# Patient Record
Sex: Female | Born: 1940 | Race: White | Hispanic: No | Marital: Married | Smoking: Former smoker
Health system: Southern US, Community
[De-identification: ages and names within clinical notes are randomized; demographics above are authoritative.]

## PROBLEM LIST (undated history)

## (undated) DIAGNOSIS — I499 Cardiac arrhythmia, unspecified: Secondary | ICD-10-CM

## (undated) DIAGNOSIS — E079 Disorder of thyroid, unspecified: Secondary | ICD-10-CM

## (undated) DIAGNOSIS — R233 Spontaneous ecchymoses: Secondary | ICD-10-CM

## (undated) DIAGNOSIS — J189 Pneumonia, unspecified organism: Secondary | ICD-10-CM

## (undated) DIAGNOSIS — R238 Other skin changes: Secondary | ICD-10-CM

## (undated) DIAGNOSIS — T4145XA Adverse effect of unspecified anesthetic, initial encounter: Secondary | ICD-10-CM

## (undated) DIAGNOSIS — I493 Ventricular premature depolarization: Secondary | ICD-10-CM

## (undated) DIAGNOSIS — J449 Chronic obstructive pulmonary disease, unspecified: Secondary | ICD-10-CM

## (undated) DIAGNOSIS — R002 Palpitations: Secondary | ICD-10-CM

## (undated) DIAGNOSIS — T7840XA Allergy, unspecified, initial encounter: Secondary | ICD-10-CM

## (undated) DIAGNOSIS — M1990 Unspecified osteoarthritis, unspecified site: Secondary | ICD-10-CM

## (undated) DIAGNOSIS — E039 Hypothyroidism, unspecified: Secondary | ICD-10-CM

## (undated) DIAGNOSIS — I739 Peripheral vascular disease, unspecified: Secondary | ICD-10-CM

## (undated) DIAGNOSIS — I2699 Other pulmonary embolism without acute cor pulmonale: Secondary | ICD-10-CM

## (undated) DIAGNOSIS — Z8489 Family history of other specified conditions: Secondary | ICD-10-CM

## (undated) DIAGNOSIS — E785 Hyperlipidemia, unspecified: Secondary | ICD-10-CM

## (undated) DIAGNOSIS — K219 Gastro-esophageal reflux disease without esophagitis: Secondary | ICD-10-CM

## (undated) DIAGNOSIS — T8859XA Other complications of anesthesia, initial encounter: Secondary | ICD-10-CM

## (undated) DIAGNOSIS — IMO0001 Reserved for inherently not codable concepts without codable children: Secondary | ICD-10-CM

## (undated) HISTORY — DX: Allergy, unspecified, initial encounter: T78.40XA

## (undated) HISTORY — PX: OTHER SURGICAL HISTORY: SHX169

## (undated) HISTORY — PX: APPENDECTOMY: SHX54

## (undated) HISTORY — PX: BREAST CYST ASPIRATION: SHX578

## (undated) HISTORY — DX: Ventricular premature depolarization: I49.3

## (undated) HISTORY — DX: Hyperlipidemia, unspecified: E78.5

## (undated) HISTORY — DX: Disorder of thyroid, unspecified: E07.9

## (undated) HISTORY — DX: Unspecified osteoarthritis, unspecified site: M19.90

## (undated) HISTORY — PX: UPPER GI ENDOSCOPY: SHX6162

## (undated) HISTORY — PX: KNEE SURGERY: SHX244

## (undated) HISTORY — PX: COLONOSCOPY: SHX174

---

## 1966-08-26 HISTORY — PX: DILATION AND CURETTAGE OF UTERUS: SHX78

## 1967-08-27 HISTORY — PX: LAPAROTOMY: SHX154

## 1970-08-26 HISTORY — PX: OTHER SURGICAL HISTORY: SHX169

## 1978-08-26 DIAGNOSIS — I739 Peripheral vascular disease, unspecified: Secondary | ICD-10-CM

## 1978-08-26 HISTORY — DX: Peripheral vascular disease, unspecified: I73.9

## 2001-01-15 ENCOUNTER — Encounter: Payer: Self-pay | Admitting: Orthopedic Surgery

## 2001-01-22 ENCOUNTER — Inpatient Hospital Stay (HOSPITAL_COMMUNITY): Admission: RE | Admit: 2001-01-22 | Discharge: 2001-01-27 | Payer: Self-pay | Admitting: Orthopedic Surgery

## 2004-03-26 HISTORY — PX: CHOLECYSTECTOMY: SHX55

## 2004-04-19 ENCOUNTER — Other Ambulatory Visit: Payer: Self-pay

## 2004-10-29 ENCOUNTER — Ambulatory Visit: Payer: Self-pay | Admitting: General Surgery

## 2004-11-30 ENCOUNTER — Ambulatory Visit: Payer: Self-pay

## 2005-08-09 ENCOUNTER — Ambulatory Visit: Payer: Self-pay | Admitting: Family Medicine

## 2005-11-04 ENCOUNTER — Ambulatory Visit: Payer: Self-pay | Admitting: General Surgery

## 2006-07-08 ENCOUNTER — Ambulatory Visit: Payer: Self-pay | Admitting: Family Medicine

## 2006-11-12 ENCOUNTER — Ambulatory Visit: Payer: Self-pay | Admitting: Unknown Physician Specialty

## 2007-10-26 ENCOUNTER — Ambulatory Visit: Payer: Self-pay | Admitting: Unknown Physician Specialty

## 2007-11-18 ENCOUNTER — Ambulatory Visit: Payer: Self-pay | Admitting: Unknown Physician Specialty

## 2008-07-28 ENCOUNTER — Ambulatory Visit: Payer: Self-pay | Admitting: Family Medicine

## 2008-10-01 ENCOUNTER — Observation Stay: Payer: Self-pay | Admitting: Internal Medicine

## 2008-10-03 ENCOUNTER — Ambulatory Visit: Payer: Self-pay | Admitting: Cardiology

## 2008-11-10 ENCOUNTER — Ambulatory Visit: Payer: Self-pay | Admitting: Unknown Physician Specialty

## 2008-11-10 LAB — HM COLONOSCOPY

## 2008-12-05 ENCOUNTER — Ambulatory Visit: Payer: Self-pay | Admitting: Unknown Physician Specialty

## 2008-12-20 ENCOUNTER — Ambulatory Visit: Payer: Self-pay | Admitting: Unknown Physician Specialty

## 2008-12-30 ENCOUNTER — Ambulatory Visit: Payer: Self-pay | Admitting: Unknown Physician Specialty

## 2009-07-17 ENCOUNTER — Ambulatory Visit: Payer: Self-pay | Admitting: Family Medicine

## 2009-07-31 ENCOUNTER — Ambulatory Visit: Payer: Self-pay | Admitting: Unknown Physician Specialty

## 2010-01-08 ENCOUNTER — Ambulatory Visit: Payer: Self-pay | Admitting: Unknown Physician Specialty

## 2010-08-08 ENCOUNTER — Ambulatory Visit: Payer: Self-pay | Admitting: Unknown Physician Specialty

## 2011-01-14 ENCOUNTER — Ambulatory Visit: Payer: Self-pay | Admitting: Unknown Physician Specialty

## 2011-03-01 ENCOUNTER — Ambulatory Visit: Payer: Self-pay | Admitting: Family Medicine

## 2011-04-01 ENCOUNTER — Ambulatory Visit: Payer: Self-pay | Admitting: Family Medicine

## 2011-04-04 ENCOUNTER — Ambulatory Visit: Payer: Self-pay | Admitting: Family Medicine

## 2011-07-11 ENCOUNTER — Ambulatory Visit: Payer: Self-pay | Admitting: Specialist

## 2011-12-07 LAB — HM DEXA SCAN

## 2012-01-28 ENCOUNTER — Ambulatory Visit: Payer: Self-pay | Admitting: Family Medicine

## 2012-05-14 ENCOUNTER — Ambulatory Visit: Payer: Self-pay | Admitting: Specialist

## 2013-04-05 ENCOUNTER — Ambulatory Visit: Payer: Self-pay | Admitting: Family Medicine

## 2013-07-29 ENCOUNTER — Ambulatory Visit: Payer: Self-pay | Admitting: Ophthalmology

## 2013-08-10 ENCOUNTER — Ambulatory Visit: Payer: Self-pay | Admitting: Ophthalmology

## 2013-08-26 HISTORY — PX: EYE SURGERY: SHX253

## 2013-09-07 ENCOUNTER — Ambulatory Visit: Payer: Self-pay | Admitting: Ophthalmology

## 2014-01-10 LAB — BASIC METABOLIC PANEL
BUN: 16 mg/dL (ref 4–21)
CREATININE: 0.9 mg/dL (ref 0.5–1.1)
GLUCOSE: 79 mg/dL
Potassium: 4.2 mmol/L (ref 3.4–5.3)
SODIUM: 141 mmol/L (ref 137–147)

## 2014-01-10 LAB — HEPATIC FUNCTION PANEL
ALT: 27 U/L (ref 7–35)
AST: 16 U/L (ref 13–35)

## 2014-01-10 LAB — CBC AND DIFFERENTIAL
HEMATOCRIT: 46 % (ref 36–46)
Hemoglobin: 15.4 g/dL (ref 12.0–16.0)
PLATELETS: 243 10*3/uL (ref 150–399)
WBC: 6.9 10^3/mL

## 2014-01-10 LAB — LIPID PANEL
Cholesterol: 223 mg/dL — AB (ref 0–200)
HDL: 92 mg/dL — AB (ref 35–70)
LDL CALC: 113 mg/dL
Triglycerides: 92 mg/dL (ref 40–160)

## 2014-01-10 LAB — TSH: TSH: 1.85 u[IU]/mL (ref 0.41–5.90)

## 2014-05-13 ENCOUNTER — Ambulatory Visit: Payer: Self-pay | Admitting: Family Medicine

## 2014-06-07 ENCOUNTER — Ambulatory Visit: Payer: Self-pay | Admitting: Family Medicine

## 2014-06-07 LAB — HM MAMMOGRAPHY

## 2014-06-24 LAB — HM PAP SMEAR: HM Pap smear: NEGATIVE

## 2014-11-25 HISTORY — PX: BLEPHAROPLASTY: SUR158

## 2014-12-16 NOTE — Op Note (Signed)
PATIENT NAME:  [name] [name], [name] [name] MR#:  [id number] DATE OF BIRTH:  [birth year]  DATE OF PROCEDURE:  08/10/2013  PREOPERATIVE DIAGNOSIS: Visually significant cataract of the left eye.   POSTOPERATIVE DIAGNOSIS: Visually significant cataract of the left eye.   OPERATIVE PROCEDURE: Cataract extraction by phacoemulsification with implant of intraocular lens to left eye.   SURGEON: Birder Robson, MD.   ANESTHESIA:  1. Managed anesthesia care.  2. Topical tetracaine drops followed by 2% Xylocaine jelly applied in the preoperative holding area.   COMPLICATIONS: None.   TECHNIQUE:  Stop and chop.  DESCRIPTION OF PROCEDURE: The patient was examined and consented in the preoperative holding area where the aforementioned topical anesthesia was applied to the left eye and then brought back to the Operating Room where the left eye was prepped and draped in the usual sterile ophthalmic fashion and a lid speculum was placed. A paracentesis was created with the side port blade and the anterior chamber was filled with viscoelastic. A near clear corneal incision was performed with the steel keratome. A continuous curvilinear capsulorrhexis was performed with a cystotome followed by the capsulorrhexis forceps. Hydrodissection and hydrodelineation were carried out with BSS on a blunt cannula. The lens was removed in a stop-and-chop technique and the remaining cortical material was removed with the irrigation-aspiration handpiece. The capsular bag was inflated with viscoelastic and the Tecnis ZCB00, 21.0-diopter lens, serial number [id number] was placed in the capsular bag without complication. The remaining viscoelastic was removed from the eye with the irrigation-aspiration handpiece. The wounds were hydrated. The anterior chamber was flushed with Miostat and the eye was inflated to physiologic pressure. 0.1 mL of cefuroxime concentration 10 mg/mL was placed in the anterior chamber. The wounds were found to be water  tight. The eye was dressed with Vigamox. The patient was given protective glasses to wear throughout the day and a shield with which to sleep tonight. The patient was also given drops with which to begin a drop regimen today and will follow-up with me in one day.   ____________________________ Livingston Diones. Tija Biss, MD wlp:cs D: 08/10/2013 15:23:45 ET T: 08/10/2013 15:49:07 ET JOB#: [id number]  cc: Tira Lafferty L. Philander Ake, MD, <Dictator>    Livingston Diones Thandiwe Siragusa MD ELECTRONICALLY SIGNED 08/11/2013 9:50

## 2014-12-17 NOTE — Op Note (Signed)
PATIENT NAME:  [name] [name], [name] [name] MR#:  [id number] DATE OF BIRTH:  08-Dec-1940  DATE OF PROCEDURE:  09/07/2013  PREOPERATIVE DIAGNOSIS: Visually significant cataract of the right eye.   POSTOPERATIVE DIAGNOSIS: Visually significant cataract of the right eye.   OPERATIVE PROCEDURE: Cataract extraction by phacoemulsification with implant of intraocular lens to right eye.   SURGEON: Birder Robson, MD.   ANESTHESIA:  1. Managed anesthesia care.  2. Topical tetracaine drops followed by 2% Xylocaine jelly applied in the preoperative holding area.   COMPLICATIONS: None.   TECHNIQUE:  Stop and chop.   DESCRIPTION OF PROCEDURE: The patient was examined and consented in the preoperative holding area where the aforementioned topical anesthesia was applied to the right eye and then brought back to the Operating Room where the right eye was prepped and draped in the usual sterile ophthalmic fashion and a lid speculum was placed. A paracentesis was created with the side port blade and the anterior chamber was filled with viscoelastic. A near clear corneal incision was performed with the steel keratome. A continuous curvilinear capsulorrhexis was performed with a cystotome followed by the capsulorrhexis forceps. Hydrodissection and hydrodelineation were carried out with BSS on a blunt cannula. The lens was removed in a stop and chop  technique and the remaining cortical material was removed with the irrigation-aspiration handpiece. The capsular bag was inflated with viscoelastic and the Tecnis ZCB00 22.0-diopter lens, serial number [id number] was placed in the capsular bag without complication. The remaining viscoelastic was removed from the eye with the irrigation-aspiration handpiece. The wounds were hydrated. The anterior chamber was flushed with Miostat and the eye was inflated to physiologic pressure. 0.1 mL of cefuroxime concentration 10 mg/mL was placed in the anterior chamber. The wounds were found to be  water tight. The eye was dressed with Vigamox. The patient was given protective glasses to wear throughout the day and a shield with which to sleep tonight. The patient was also given drops with which to begin a drop regimen today and will follow-up with me in one day.     ____________________________ Livingston Diones. Tima Curet, MD wlp:ms D: 09/07/2013 22:41:30 ET T: 09/07/2013 22:49:37 ET JOB#: [id number]  cc: Geryl Dohn L. Constance Hackenberg, MD, <Dictator> Livingston Diones Macaria Bias MD ELECTRONICALLY SIGNED 09/08/2013 9:41

## 2015-05-25 ENCOUNTER — Ambulatory Visit (INDEPENDENT_AMBULATORY_CARE_PROVIDER_SITE_OTHER): Payer: Medicare PPO

## 2015-05-25 DIAGNOSIS — Z23 Encounter for immunization: Secondary | ICD-10-CM | POA: Diagnosis not present

## 2015-05-26 DIAGNOSIS — M542 Cervicalgia: Secondary | ICD-10-CM | POA: Insufficient documentation

## 2015-05-26 DIAGNOSIS — K5792 Diverticulitis of intestine, part unspecified, without perforation or abscess without bleeding: Secondary | ICD-10-CM | POA: Insufficient documentation

## 2015-05-26 DIAGNOSIS — K649 Unspecified hemorrhoids: Secondary | ICD-10-CM | POA: Insufficient documentation

## 2015-05-26 DIAGNOSIS — E039 Hypothyroidism, unspecified: Secondary | ICD-10-CM | POA: Insufficient documentation

## 2015-05-26 DIAGNOSIS — J309 Allergic rhinitis, unspecified: Secondary | ICD-10-CM | POA: Insufficient documentation

## 2015-05-26 DIAGNOSIS — M8580 Other specified disorders of bone density and structure, unspecified site: Secondary | ICD-10-CM | POA: Insufficient documentation

## 2015-05-26 DIAGNOSIS — R002 Palpitations: Secondary | ICD-10-CM | POA: Insufficient documentation

## 2015-05-26 DIAGNOSIS — J449 Chronic obstructive pulmonary disease, unspecified: Secondary | ICD-10-CM | POA: Insufficient documentation

## 2015-05-26 DIAGNOSIS — Z8709 Personal history of other diseases of the respiratory system: Secondary | ICD-10-CM | POA: Insufficient documentation

## 2015-05-26 DIAGNOSIS — Z87898 Personal history of other specified conditions: Secondary | ICD-10-CM | POA: Insufficient documentation

## 2015-05-26 DIAGNOSIS — R252 Cramp and spasm: Secondary | ICD-10-CM | POA: Insufficient documentation

## 2015-05-26 DIAGNOSIS — M6530 Trigger finger, unspecified finger: Secondary | ICD-10-CM | POA: Insufficient documentation

## 2015-05-26 DIAGNOSIS — R918 Other nonspecific abnormal finding of lung field: Secondary | ICD-10-CM | POA: Insufficient documentation

## 2015-05-26 DIAGNOSIS — E7800 Pure hypercholesterolemia, unspecified: Secondary | ICD-10-CM | POA: Insufficient documentation

## 2015-05-26 DIAGNOSIS — K5790 Diverticulosis of intestine, part unspecified, without perforation or abscess without bleeding: Secondary | ICD-10-CM | POA: Insufficient documentation

## 2015-05-26 DIAGNOSIS — F0781 Postconcussional syndrome: Secondary | ICD-10-CM | POA: Insufficient documentation

## 2015-05-26 DIAGNOSIS — H269 Unspecified cataract: Secondary | ICD-10-CM | POA: Insufficient documentation

## 2015-05-26 DIAGNOSIS — K648 Other hemorrhoids: Secondary | ICD-10-CM | POA: Insufficient documentation

## 2015-05-26 DIAGNOSIS — J984 Other disorders of lung: Secondary | ICD-10-CM | POA: Insufficient documentation

## 2015-05-26 DIAGNOSIS — E079 Disorder of thyroid, unspecified: Secondary | ICD-10-CM | POA: Insufficient documentation

## 2015-05-29 ENCOUNTER — Ambulatory Visit (INDEPENDENT_AMBULATORY_CARE_PROVIDER_SITE_OTHER): Payer: Medicare PPO | Admitting: Family Medicine

## 2015-05-29 ENCOUNTER — Encounter: Payer: Self-pay | Admitting: Family Medicine

## 2015-05-29 VITALS — BP 118/70 | HR 72 | Temp 97.4°F | Resp 16 | Wt 134.0 lb

## 2015-05-29 DIAGNOSIS — J449 Chronic obstructive pulmonary disease, unspecified: Secondary | ICD-10-CM | POA: Insufficient documentation

## 2015-05-29 DIAGNOSIS — S8992XA Unspecified injury of left lower leg, initial encounter: Secondary | ICD-10-CM

## 2015-05-29 DIAGNOSIS — M25562 Pain in left knee: Secondary | ICD-10-CM

## 2015-05-29 NOTE — Progress Notes (Signed)
Subjective:    Patient ID: [name] [name], female    DOB: December 06, 1940, 74 y.o.   MRN: [id number]  Knee Pain  The incident occurred more than 1 week ago (x 1.5 months; pt fell at home). The incident occurred at home. The injury mechanism was a fall and a twisting injury. The pain is present in the left knee. The quality of the pain is described as aching. The pain is at a severity of 2/10 (can get up to a 7/10). The pain is mild. The pain has been fluctuating since onset. Pertinent negatives include no inability to bear weight, loss of motion, loss of sensation, muscle weakness, numbness or tingling. Exacerbated by: sitting, lying. She has tried acetaminophen and NSAIDs for the symptoms. The treatment provided mild relief.  Pt notes crepitus.    Review of Systems  Constitutional: Negative for fever, chills, diaphoresis, activity change, appetite change, fatigue and unexpected weight change.  Respiratory: Negative for cough, shortness of breath and wheezing.   Cardiovascular: Positive for palpitations (had cardiology work up 3-4 years ago; problem is reocurring) and leg swelling. Negative for chest pain.  Musculoskeletal: Positive for arthralgias.  Neurological: Negative for tingling and numbness.   BP 118/70 mmHg  Pulse 72  Temp(Src) 97.4 F (36.3 C) (Oral)  Resp 16  Wt 134 lb (60.782 kg)   Patient Active Problem List   Diagnosis Date Noted  . Chronic obstructive pulmonary disease ([place]) 05/29/2015  . Allergic rhinitis 05/26/2015  . Bilateral cataracts 05/26/2015  . CAFL (chronic airflow limitation) ([place]) 05/26/2015  . 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 History of palpitations 05/26/2015  . H/O respiratory system disease 05/26/2015  . Calcium blood increased 05/26/2015  . Hypercholesteremia 05/26/2015  . Adult hypothyroidism 05/26/2015  . Cramps of lower extremity 05/26/2015  . Hemorrhoids, internal 05/26/2015  . Disease of lung 05/26/2015  . Lung mass 05/26/2015  .  Cervical pain 05/26/2015  . Osteopenia 05/26/2015  . Awareness of heartbeats 05/26/2015  . 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 Acquired trigger finger 05/26/2015  . Disease of thyroid gland 05/26/2015   Past Medical History  Diagnosis Date  . Allergy   . Thyroid disease   . Hyperlipidemia    Current Outpatient Prescriptions on File Prior to Visit  Medication Sig  . Acetaminophen 500 MG coapsule Take 2 capsules by mouth every 6 (six) hours as needed.  Marland Kitchen albuterol (VENTOLIN HFA) 108 (90 BASE) MCG/ACT inhaler Inhale into the lungs. 2 puffs every 6 hours as needed  . fexofenadine (ALLEGRA) 180 MG tablet Take 1 tablet by mouth daily.  . Fluticasone-Salmeterol (ADVAIR DISKUS) 250-50 MCG/DOSE AEPB Inhale into the lungs. 1 puff BID daily  . levothyroxine (SYNTHROID, LEVOTHROID) 75 MCG tablet Take 1 tablet by mouth daily.  . MULTIPLE VITAMIN PO Take 1 tablet by mouth daily.  Marland Kitchen omeprazole (PRILOSEC) 20 MG capsule Take 1 capsule by mouth daily.  Marland Kitchen tiotropium (SPIRIVA HANDIHALER) 18 MCG inhalation capsule Place 1 capsule into inhaler and inhale daily.  . budesonide (PULMICORT) 0.25 MG/2ML nebulizer solution Inhale into the lungs.  . Calcium-Vitamin D 600-200 MG-UNIT tablet Take 1 tablet by mouth daily.  Marland Kitchen loperamide (IMODIUM) 2 MG capsule Take 2 capsules by mouth 2 (two) times daily.  . meloxicam (MOBIC) 7.5 MG tablet Take 1 tablet by mouth daily.  . Omega-3 Fatty Acids (FISH OIL) 1000 MG CAPS Take 1 capsule by mouth daily.   No current facility-administered medications on file prior to visit.   Allergies  Allergen Reactions  . Codeine   . Diphenhydramine     Decreased BP and pulse rate  . Montelukast Sodium     Insomnia  . Morphine Sulfate   . Nsaids     Can take Meloxicam, Naproxen  . Tramadol     Mental status change  . Celecoxib Rash    Epigastric pain  . Rofecoxib Rash    Epigastric pain   Past Surgical History  Procedure Laterality Date  . Cholecystectomy  03/2004   . 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 Patelectomy Right   . Removal of medical meniscus  1972  . Laparotomy  1969    fertility testing  . Dilation and curettage of uterus  1968    fertility testing  . Appendectomy    . Abdominal surgery     Social History   Social History  . Marital Status: Married    Spouse Name: N/A  . Number of Children: N/A  . Years of Education: N/A   Occupational History  . Not on file.   Social History Main Topics  . Smoking status: Former Smoker    Quit date: 08/25/1998  . Smokeless tobacco: Never Used  . Alcohol Use: Yes  . Drug Use: No  . Sexual Activity: Not on file   Other Topics Concern  . Not on file   Social History Narrative   Family History  Problem Relation Age of Onset  . Diabetes Mother   . Hypertension Mother   . Congestive Heart Failure Mother   . Heart disease Father   . Hypertension Father   . Hyperlipidemia Sister   . Heart disease Sister   . Paget's disease of bone Sister   . Hyperthyroidism Sister      .result     Objective:   Physical Exam  Constitutional: She appears well-developed and well-nourished.  Musculoskeletal: She exhibits no edema.       Right knee: She exhibits swelling (under patella).       Left knee: She exhibits decreased range of motion and swelling (under patella). Tenderness found.  Psychiatric: She has a normal mood and affect. Her behavior is normal.   BP 118/70 mmHg  Pulse 72  Temp(Src) 97.4 F (36.3 C) (Oral)  Resp 16  Wt 134 lb (60.782 kg)     Assessment & Plan:  1. Left knee pain Refer to Dr. Mack Guise as below. Advised pt to try Mobic (she already as rx at home). - Ambulatory referral to Orthopedic Surgery  2. Acute injury of knee cartilage, left, initial encounter See plan above.   Patient seen and examined by Jerrell Belfast, MD, and note scribed by Renaldo Fiddler, CMA. I have reviewed the document for accuracy and  completeness and I agree with above. Jerrell Belfast, MD   Margarita Rana, MD

## 2015-06-28 ENCOUNTER — Encounter: Payer: Self-pay | Admitting: Family Medicine

## 2015-06-28 ENCOUNTER — Ambulatory Visit (INDEPENDENT_AMBULATORY_CARE_PROVIDER_SITE_OTHER): Payer: Medicare PPO | Admitting: Family Medicine

## 2015-06-28 VITALS — BP 108/60 | HR 76 | Temp 97.6°F | Resp 16 | Ht 63.0 in | Wt 133.0 lb

## 2015-06-28 DIAGNOSIS — M8580 Other specified disorders of bone density and structure, unspecified site: Secondary | ICD-10-CM | POA: Diagnosis not present

## 2015-06-28 DIAGNOSIS — Z1231 Encounter for screening mammogram for malignant neoplasm of breast: Secondary | ICD-10-CM

## 2015-06-28 DIAGNOSIS — E7800 Pure hypercholesterolemia, unspecified: Secondary | ICD-10-CM | POA: Diagnosis not present

## 2015-06-28 DIAGNOSIS — E039 Hypothyroidism, unspecified: Secondary | ICD-10-CM

## 2015-06-28 DIAGNOSIS — Z0000 Encounter for general adult medical examination without abnormal findings: Secondary | ICD-10-CM

## 2015-06-28 DIAGNOSIS — R002 Palpitations: Secondary | ICD-10-CM | POA: Diagnosis not present

## 2015-06-28 DIAGNOSIS — Z8679 Personal history of other diseases of the circulatory system: Secondary | ICD-10-CM | POA: Diagnosis not present

## 2015-06-28 DIAGNOSIS — Z87898 Personal history of other specified conditions: Secondary | ICD-10-CM

## 2015-06-28 NOTE — Progress Notes (Signed)
Patient: [name] [name], Female    DOB: 1941-03-08, 74 y.o.   MRN: [id number] Visit Date: 06/28/2015  Today's Provider: Margarita Rana, MD   Chief Complaint  Patient presents with  . Medicare Wellness  . Palpitations   Subjective:    Annual wellness visit [name] [name] is a 74 y.o. female. She feels well. She reports exercising daily; walks 5 miles daily. She reports she is sleeping fairly well.  Chronic problems stable.   ----------------------------------------------------------- Last CPE- 06/24/2014 Last Pap- 06/24/2014 Neg Last Mammo- 06/07/2014- BI-RADS 1 Last colon- 11/10/2008- diverticulosis, internal hemorrhoids- recheck 10 years Last BMD- 11/07/2011- Osteopenia Last EKG- 07/29/2013  Palpitations: Patient complains of palpitations.  The symptoms are of mild to moderate in severity, occuring intermittently and lasting several minutes per episode. Cardiac risk factors include: advanced age (older than 66 for men, 75 for women). Aggravating factors: none. Relieving factors: none. Associated signs and symptoms: has complaint(s) of irregular heart beat, lower extremity edema and palpitations.\ Pt reports she has a h/o palpitations about 10 years ago, and only recently has this problem reoccurred. 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  Review of Systems  HENT: Positive for congestion, sinus pressure, sneezing, tinnitus and voice change.   Eyes: Positive for photophobia.  Respiratory: Positive for chest tightness.   Cardiovascular: Positive for palpitations.  Gastrointestinal: Positive for constipation.  Endocrine: Positive for cold intolerance.  Genitourinary: Positive for enuresis.  Musculoskeletal: Positive for myalgias, back pain, arthralgias and neck pain.  Allergic/Immunologic: Positive for environmental allergies.  Neurological: Positive for light-headedness.  Hematological: Bruises/bleeds easily.  All other systems reviewed and are  negative.   Social History   Social History  . Marital Status: Married    Spouse Name: N/A  . Number of Children: N/A  . Years of Education: N/A   Occupational History  . Not on file.   Social History Main Topics  . Smoking status: Former Smoker    Quit date: 08/25/1998  . Smokeless tobacco: Never Used  . Alcohol Use: Yes     Comment: occasionally  . Drug Use: No  . Sexual Activity: Not on file   Other Topics Concern  . Not on file   Social History Narrative    Patient Active Problem List   Diagnosis Date Noted  . Chronic obstructive pulmonary disease ([place]) 05/29/2015  . Allergic rhinitis 05/26/2015  . Bilateral cataracts 05/26/2015  . CAFL (chronic airflow limitation) ([place]) 05/26/2015  . 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 History of palpitations 05/26/2015  . H/O respiratory system disease 05/26/2015  . Calcium blood increased 05/26/2015  . Hypercholesteremia 05/26/2015  . Adult hypothyroidism 05/26/2015  . Cramps of lower extremity 05/26/2015  . Hemorrhoids, internal 05/26/2015  . Disease of lung 05/26/2015  . Lung mass 05/26/2015  . Cervical pain 05/26/2015  . Osteopenia 05/26/2015  . Awareness of heartbeats 05/26/2015  . 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 Acquired trigger finger 05/26/2015  . Disease of thyroid gland 05/26/2015    Past Surgical History  Procedure Laterality Date  . Cholecystectomy  03/2004  . 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 Patelectomy Right   . Removal of medical meniscus  1972  . Laparotomy  1969    fertility testing  . Dilation and curettage of uterus  1968    fertility testing  . Appendectomy    . Abdominal surgery    .  Eye surgery Bilateral 2015    cataract excision  . Blepharoplasty Bilateral 11/2014    Her family history includes Congestive Heart Failure in her mother; Diabetes in her mother; Heart disease in her father and sister; Hyperlipidemia in her sister;  Hypertension in her father and mother; Hyperthyroidism in her sister; Paget's disease of bone in her sister.    Previous Medications   ACETAMINOPHEN 500 MG COAPSULE    Take 2 capsules by mouth every 6 (six) hours as needed.   ALBUTEROL (VENTOLIN HFA) 108 (90 BASE) MCG/ACT INHALER    Inhale into the lungs. 2 puffs every 6 hours as needed   FEXOFENADINE (ALLEGRA) 180 MG TABLET    Take 1 tablet by mouth daily.   FLONASE 50 MCG/ACT NASAL SPRAY       FLUTICASONE-SALMETEROL (ADVAIR DISKUS) 250-50 MCG/DOSE AEPB    Inhale into the lungs. 1 puff BID daily   LEVOTHYROXINE (SYNTHROID, LEVOTHROID) 75 MCG TABLET    Take 1 tablet by mouth daily.   MAGNESIUM PO    Take by mouth.   MULTIPLE VITAMIN PO    Take 1 tablet by mouth daily.   OMEGA-3 FATTY ACIDS (FISH OIL) 1000 MG CAPS    Take 1 capsule by mouth daily.   OMEPRAZOLE (PRILOSEC) 20 MG CAPSULE    Take 1 capsule by mouth daily.   TIOTROPIUM (SPIRIVA HANDIHALER) 18 MCG INHALATION CAPSULE    Place 1 capsule into inhaler and inhale daily.   TRIAMCINOLONE (NASACORT ALLERGY 24HR) 55 MCG/ACT AERO NASAL INHALER    Place 2 sprays into the nose daily.    Patient Care Team: Margarita Rana, MD as PCP - General (Family Medicine)     Objective:   Vitals: BP 108/60 mmHg  Pulse 76  Temp(Src) 97.6 F (36.4 C) (Oral)  Resp 16  Ht 5\' 3"  (1.6 m)  Wt 133 lb (60.328 kg)  BMI 23.57 kg/m2  Physical Exam  Activities of Daily Living In your present state of health, do you have any difficulty performing the following activities: 06/28/2015  Hearing? Y  Vision? N  Difficulty concentrating or making decisions? N  Walking or climbing stairs? N  Dressing or bathing? N  Doing errands, shopping? N    Fall Risk Assessment Fall Risk  06/28/2015  Falls in the past year? Yes  Number falls in past yr: 1  Injury with Fall? No     Depression Screen PHQ 2/9 Scores 06/28/2015  PHQ - 2 Score 0    Cognitive Testing - 6-CIT  Correct? Score   What year is it? yes 0 0  or 4  What month is it? yes 0 0 or 3  Memorize:    Pia Mau,  42,  [place],      What time is it? (within 1 hour) yes 0 0 or 3  Count backwards from 20 yes 0 0, 2, or 4  Name the months of the year yes 0 0, 2, or 4  Repeat name & address above yes 0 0, 2, 4, 6, 8, or 10       TOTAL SCORE  0/28   Interpretation:  Normal  Normal (0-7) Abnormal (8-28)       Assessment & Plan:     Annual Wellness Visit  Reviewed patient's Family Medical History Reviewed and updated list of patient's medical providers Assessment of cognitive impairment was done Assessed patient's functional ability Established a written schedule for health screening [place] Completed  and Reviewed  Exercise Activities and Dietary recommendations Goals    None      Immunization History  Administered Date(s) Administered  . Influenza, High Dose Seasonal PF 05/25/2015  . Pneumococcal Conjugate-13 06/24/2014  . Pneumococcal Polysaccharide-23 09/23/2013  . Tdap 11/02/2010    Health Maintenance  Topic Date Due  . ZOSTAVAX  05/15/2001  . INFLUENZA VACCINE  03/26/2016  . MAMMOGRAM  06/07/2016  . COLONOSCOPY  11/11/2018  . TETANUS/TDAP  11/01/2020  . DEXA SCAN  Completed  . PNA vac Low Risk Adult  Completed      Discussed health benefits of physical activity, and encouraged her to engage in regular exercise appropriate for her age and condition.   1. Medicare annual wellness visit, subsequent As above.    2. Hypothyroidism, unspecified hypothyroidism type Stable. Check labs.  - TSH  3. Awareness of heartbeats New problem or recurrent. Unclear if similar to previous issues. EKG ok today. Will refer to cardiology to evaluate and treat.   - EKG 12-Lead - Ambulatory referral to Cardiology  4. History of palpitations As above.  - Ambulatory referral to Cardiology  5. Hypercholesteremia Stable. Will check labs.   - CBC with Differential/Platelet -  Comprehensive metabolic panel - Lipid panel  6. Osteopenia Will schedule.  - DG Bone Density; Future  7. Encounter for screening mammogram for breast cancer Will call and schedule.  - MM DIGITAL SCREENING BILATERAL; Future  Patient was seen and examined by Jerrell Belfast, MD, and note scribed by Renaldo Fiddler, CMA. I have reviewed the document for accuracy and completeness and I agree with above. Jerrell Belfast, MD   Margarita Rana, MD

## 2015-06-30 DIAGNOSIS — J019 Acute sinusitis, unspecified: Secondary | ICD-10-CM | POA: Insufficient documentation

## 2015-06-30 DIAGNOSIS — S0990XA Unspecified injury of head, initial encounter: Secondary | ICD-10-CM | POA: Insufficient documentation

## 2015-06-30 DIAGNOSIS — Z23 Encounter for immunization: Secondary | ICD-10-CM | POA: Insufficient documentation

## 2015-06-30 DIAGNOSIS — J441 Chronic obstructive pulmonary disease with (acute) exacerbation: Secondary | ICD-10-CM | POA: Insufficient documentation

## 2015-06-30 DIAGNOSIS — J329 Chronic sinusitis, unspecified: Secondary | ICD-10-CM | POA: Insufficient documentation

## 2015-06-30 DIAGNOSIS — Z8619 Personal history of other infectious and parasitic diseases: Secondary | ICD-10-CM | POA: Insufficient documentation

## 2015-06-30 DIAGNOSIS — Z0000 Encounter for general adult medical examination without abnormal findings: Secondary | ICD-10-CM | POA: Insufficient documentation

## 2015-06-30 DIAGNOSIS — J189 Pneumonia, unspecified organism: Secondary | ICD-10-CM | POA: Insufficient documentation

## 2015-06-30 DIAGNOSIS — Z13820 Encounter for screening for osteoporosis: Secondary | ICD-10-CM | POA: Insufficient documentation

## 2015-06-30 DIAGNOSIS — R238 Other skin changes: Secondary | ICD-10-CM

## 2015-06-30 DIAGNOSIS — M79609 Pain in unspecified limb: Secondary | ICD-10-CM | POA: Insufficient documentation

## 2015-06-30 DIAGNOSIS — R252 Cramp and spasm: Secondary | ICD-10-CM | POA: Insufficient documentation

## 2015-06-30 DIAGNOSIS — R911 Solitary pulmonary nodule: Secondary | ICD-10-CM | POA: Insufficient documentation

## 2015-06-30 DIAGNOSIS — N390 Urinary tract infection, site not specified: Secondary | ICD-10-CM | POA: Insufficient documentation

## 2015-07-01 LAB — CBC WITH DIFFERENTIAL/PLATELET
BASOS ABS: 0 10*3/uL (ref 0.0–0.2)
BASOS: 0 %
EOS (ABSOLUTE): 0.1 10*3/uL (ref 0.0–0.4)
Eos: 2 %
Hematocrit: 42.8 % (ref 34.0–46.6)
Hemoglobin: 14.9 g/dL (ref 11.1–15.9)
IMMATURE GRANULOCYTES: 0 %
Immature Grans (Abs): 0 10*3/uL (ref 0.0–0.1)
Lymphocytes Absolute: 2 10*3/uL (ref 0.7–3.1)
Lymphs: 38 %
MCH: 33.2 pg — AB (ref 26.6–33.0)
MCHC: 34.8 g/dL (ref 31.5–35.7)
MCV: 95 fL (ref 79–97)
MONOS ABS: 0.5 10*3/uL (ref 0.1–0.9)
Monocytes: 10 %
NEUTROS PCT: 50 %
Neutrophils Absolute: 2.6 10*3/uL (ref 1.4–7.0)
PLATELETS: 219 10*3/uL (ref 150–379)
RBC: 4.49 x10E6/uL (ref 3.77–5.28)
RDW: 13.8 % (ref 12.3–15.4)
WBC: 5.2 10*3/uL (ref 3.4–10.8)

## 2015-07-01 LAB — TSH: TSH: 1.59 u[IU]/mL (ref 0.450–4.500)

## 2015-07-01 LAB — COMPREHENSIVE METABOLIC PANEL
A/G RATIO: 2.2 (ref 1.1–2.5)
ALK PHOS: 56 IU/L (ref 39–117)
ALT: 27 IU/L (ref 0–32)
AST: 26 IU/L (ref 0–40)
Albumin: 4.3 g/dL (ref 3.5–4.8)
BILIRUBIN TOTAL: 1.1 mg/dL (ref 0.0–1.2)
BUN/Creatinine Ratio: 19 (ref 11–26)
BUN: 14 mg/dL (ref 8–27)
CHLORIDE: 99 mmol/L (ref 97–106)
CO2: 25 mmol/L (ref 18–29)
Calcium: 9.4 mg/dL (ref 8.7–10.3)
Creatinine, Ser: 0.72 mg/dL (ref 0.57–1.00)
GFR calc non Af Amer: 83 mL/min/{1.73_m2} (ref >59)
GFR, EST AFRICAN AMERICAN: 95 mL/min/{1.73_m2} (ref >59)
GLUCOSE: 85 mg/dL (ref 65–99)
Globulin, Total: 2 g/dL (ref 1.5–4.5)
POTASSIUM: 4.1 mmol/L (ref 3.5–5.2)
Sodium: 137 mmol/L (ref 136–144)
Total Protein: 6.3 g/dL (ref 6.0–8.5)

## 2015-07-01 LAB — LIPID PANEL
CHOLESTEROL TOTAL: 230 mg/dL — AB (ref 100–199)
Chol/HDL Ratio: 2.6 ratio units (ref 0.0–4.4)
HDL: 90 mg/dL (ref >39)
LDL Calculated: 128 mg/dL — ABNORMAL HIGH (ref 0–99)
TRIGLYCERIDES: 60 mg/dL (ref 0–149)
VLDL CHOLESTEROL CAL: 12 mg/dL (ref 5–40)

## 2015-07-03 ENCOUNTER — Telehealth: Payer: Self-pay

## 2015-07-03 NOTE — Telephone Encounter (Signed)
-----   Message from Margarita Rana, MD sent at 07/01/2015 10:09 AM EDT ----- Cholesterol is elevated 230, does have mildly elevated LDL at 128, with high good cholesterol. 10 year risk of heart disease is 13 percent. Baby ASA daily is recommended if  not taking one. ALso, statin is indicated if would like to start one.  That is a little controversial as risk bases on age.  Thanks- Dr. Jerilynn Mages

## 2015-07-03 NOTE — Telephone Encounter (Signed)
Pt advised.  She states she has tried a statin before and caused her to have muscle pain.   Thanks,   -Mickel Baas

## 2015-07-07 ENCOUNTER — Encounter: Payer: Self-pay | Admitting: Family Medicine

## 2015-07-10 ENCOUNTER — Telehealth: Payer: Self-pay | Admitting: Family Medicine

## 2015-07-10 NOTE — Telephone Encounter (Signed)
Sent patient message re: CT.

## 2015-07-17 ENCOUNTER — Encounter: Payer: Self-pay | Admitting: Family Medicine

## 2015-07-17 DIAGNOSIS — R911 Solitary pulmonary nodule: Secondary | ICD-10-CM

## 2015-07-17 DIAGNOSIS — Z87891 Personal history of nicotine dependence: Secondary | ICD-10-CM

## 2015-07-19 ENCOUNTER — Ambulatory Visit: Payer: Self-pay

## 2015-07-19 ENCOUNTER — Other Ambulatory Visit: Payer: Self-pay

## 2015-07-25 ENCOUNTER — Ambulatory Visit
Admission: RE | Admit: 2015-07-25 | Discharge: 2015-07-25 | Disposition: A | Discharge status: Home / Self Care | Payer: Medicare PPO | Source: Ambulatory Visit | Attending: Family Medicine | Admitting: Family Medicine

## 2015-07-25 ENCOUNTER — Other Ambulatory Visit: Payer: Self-pay | Admitting: Family Medicine

## 2015-07-25 DIAGNOSIS — M810 Age-related osteoporosis without current pathological fracture: Secondary | ICD-10-CM | POA: Insufficient documentation

## 2015-07-25 DIAGNOSIS — Z1231 Encounter for screening mammogram for malignant neoplasm of breast: Secondary | ICD-10-CM

## 2015-07-25 DIAGNOSIS — Z13820 Encounter for screening for osteoporosis: Secondary | ICD-10-CM | POA: Insufficient documentation

## 2015-07-25 DIAGNOSIS — M8580 Other specified disorders of bone density and structure, unspecified site: Secondary | ICD-10-CM

## 2015-08-01 ENCOUNTER — Ambulatory Visit
Admission: RE | Admit: 2015-08-01 | Discharge: 2015-08-01 | Disposition: A | Discharge status: Home / Self Care | Payer: Medicare PPO | Source: Ambulatory Visit | Attending: Family Medicine | Admitting: Family Medicine

## 2015-08-01 ENCOUNTER — Other Ambulatory Visit: Payer: Self-pay | Admitting: Family Medicine

## 2015-08-01 ENCOUNTER — Telehealth: Payer: Self-pay | Admitting: Family Medicine

## 2015-08-01 ENCOUNTER — Other Ambulatory Visit: Payer: Self-pay

## 2015-08-01 DIAGNOSIS — Z87891 Personal history of nicotine dependence: Secondary | ICD-10-CM | POA: Diagnosis present

## 2015-08-01 DIAGNOSIS — I2510 Atherosclerotic heart disease of native coronary artery without angina pectoris: Secondary | ICD-10-CM | POA: Insufficient documentation

## 2015-08-01 DIAGNOSIS — L989 Disorder of the skin and subcutaneous tissue, unspecified: Secondary | ICD-10-CM | POA: Insufficient documentation

## 2015-08-01 DIAGNOSIS — I700 Atherosclerosis of aorta: Secondary | ICD-10-CM | POA: Insufficient documentation

## 2015-08-01 DIAGNOSIS — R911 Solitary pulmonary nodule: Secondary | ICD-10-CM

## 2015-08-01 DIAGNOSIS — R918 Other nonspecific abnormal finding of lung field: Secondary | ICD-10-CM

## 2015-08-01 MED ORDER — IOHEXOL 350 MG/ML SOLN
75.0000 mL | Freq: Once | INTRAVENOUS | Status: AC | PRN
Start: 2015-08-01 — End: 2015-08-01
  Administered 2015-08-01: 60 mL via INTRAVENOUS

## 2015-08-01 NOTE — Telephone Encounter (Signed)
Will see patient when they are available.  Thanks.

## 2015-08-01 NOTE — Telephone Encounter (Signed)
Pt stated she was returning your call and if possible she would like you to call her back before 2:30 b/c her husband has appt across the hall at 3 this afternoon. Pt stated she will try to come over this afternoon to see if you are available if you can't call her before she leaves for her husband's appt. Thanks TNP

## 2015-08-10 ENCOUNTER — Inpatient Hospital Stay: Payer: Medicare PPO | Attending: Cardiothoracic Surgery | Admitting: Cardiothoracic Surgery

## 2015-08-10 ENCOUNTER — Inpatient Hospital Stay: Payer: Medicare PPO

## 2015-08-10 ENCOUNTER — Encounter: Payer: Self-pay | Admitting: Cardiothoracic Surgery

## 2015-08-10 VITALS — BP 147/85 | HR 79 | Temp 97.7°F | Ht 63.0 in | Wt 134.7 lb

## 2015-08-10 DIAGNOSIS — R911 Solitary pulmonary nodule: Secondary | ICD-10-CM | POA: Insufficient documentation

## 2015-08-10 DIAGNOSIS — R918 Other nonspecific abnormal finding of lung field: Secondary | ICD-10-CM

## 2015-08-10 LAB — PROTIME-INR
INR: 0.94
PROTHROMBIN TIME: 12.8 s (ref 11.4–15.0)

## 2015-08-10 LAB — CBC WITH DIFFERENTIAL/PLATELET
BASOS ABS: 0.1 10*3/uL (ref 0–0.1)
BASOS PCT: 1 %
EOS ABS: 0.1 10*3/uL (ref 0–0.7)
Eosinophils Relative: 2 %
HEMATOCRIT: 43.4 % (ref 35.0–47.0)
HEMOGLOBIN: 14.7 g/dL (ref 12.0–16.0)
Lymphocytes Relative: 27 %
Lymphs Abs: 1.4 10*3/uL (ref 1.0–3.6)
MCH: 32.4 pg (ref 26.0–34.0)
MCHC: 33.8 g/dL (ref 32.0–36.0)
MCV: 95.7 fL (ref 80.0–100.0)
MONO ABS: 0.5 10*3/uL (ref 0.2–0.9)
Monocytes Relative: 10 %
NEUTROS ABS: 3.2 10*3/uL (ref 1.4–6.5)
NEUTROS PCT: 60 %
Platelets: 275 10*3/uL (ref 150–440)
RBC: 4.54 MIL/uL (ref 3.80–5.20)
RDW: 12.8 % (ref 11.5–14.5)
WBC: 5.3 10*3/uL (ref 3.6–11.0)

## 2015-08-10 LAB — APTT: APTT: 28 s (ref 24–36)

## 2015-08-10 NOTE — Progress Notes (Signed)
Opened chart to complete follow up note.

## 2015-08-10 NOTE — Progress Notes (Signed)
Patient ID: [name] [name], female   DOB: 12/07/1940, 74 y.o.   MRN: [id number]  Chief Complaint  Patient presents with  . Lung Mass    Results    Referred By Dr. Margarita Rana Reason for Referral right lower lobe mass  HPI Location, Quality, Duration, Severity, Timing, Context, Modifying Factors, Associated Signs and Symptoms.  [name] [name] is a 74 y.o. female.  This patient is a 74 year old female with a prior smoking history having quit approximately 18 years ago. She smoked for about 30 years altogether. She was in her usual state of health until she went to have her annual exam and requested a CT scan on the basis of her smoking history. In addition she has a diagnosis of severe COPD for which she had been followed by Dr. Raul Del at the current nodal clinic. Because of this a CT scan was performed which revealed a right lower lobe nodule measuring about 1 cm in size. When compared to a prior scan and 2012 the nodule is new and highly concerning for malignancy. The patient presents here for further evaluation and/or treatment. She states that she does not get short of breath despite her diagnosis of COPD. She is able to walk as often as she would like. She walked up a flight of stairs to get here today without any limitations. She's had no fevers or chills or cough. She has had a pneumonia in the past and states that she has no symptoms that are similar to this.   Past Medical History  Diagnosis Date  . Allergy   . Thyroid disease   . Hyperlipidemia   . PVC (premature ventricular contraction)     Past Surgical History  Procedure Laterality Date  . Cholecystectomy  03/2004  . 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 Patelectomy Right   . Removal of medical meniscus  1972  . Laparotomy  1969    fertility testing  . Dilation and curettage of uterus  1968    fertility testing  . Appendectomy    . Abdominal surgery    . Eye surgery Bilateral 2015     cataract excision  . Blepharoplasty Bilateral 11/2014  . Breast cyst aspiration Left     Family History  Problem Relation Age of Onset  . Diabetes Mother   . Hypertension Mother   . Congestive Heart Failure Mother   . Heart disease Father   . Hypertension Father   . Hyperlipidemia Sister   . Heart disease Sister   . Paget's disease of bone Sister   . Hyperthyroidism Sister   . Cancer Sister     Social History Social History  Substance Use Topics  . Smoking status: Former Smoker    Quit date: 08/25/1998  . Smokeless tobacco: Never Used  . Alcohol Use: Yes     Comment: occasionally    Allergies  Allergen Reactions  . Codeine   . Diphenhydramine     Decreased BP and pulse rate  . Montelukast Sodium     Insomnia  . Morphine Sulfate   . Nsaids     Can take Meloxicam, Naproxen  . Tramadol     Mental status change  . Celecoxib Rash    Epigastric pain  . Rofecoxib Rash    Epigastric pain    Current Outpatient Prescriptions  Medication Sig Dispense Refill  . Acetaminophen 500 MG coapsule Take 2 capsules by mouth every 6 (six) hours as  needed.    Marland Kitchen albuterol (VENTOLIN HFA) 108 (90 BASE) MCG/ACT inhaler Inhale into the lungs. 2 puffs every 6 hours as needed    . aspirin 81 MG tablet     . bacitracin ophthalmic ointment APPLY A SMALL AMOUNT ON SUTURES 4 TIMES A DAY FOR 10-14 DAYS  2  . fexofenadine (ALLEGRA) 180 MG tablet Take 1 tablet by mouth daily.    . flecainide (TAMBOCOR) 50 MG tablet Take by mouth.    . Fluticasone-Salmeterol (ADVAIR DISKUS) 250-50 MCG/DOSE AEPB Inhale into the lungs. 1 puff BID daily    . levothyroxine (SYNTHROID, LEVOTHROID) 75 MCG tablet Take 1 tablet by mouth daily.    . MULTIPLE VITAMIN PO Take 1 tablet by mouth daily.    Marland Kitchen omeprazole (PRILOSEC) 20 MG capsule Take 1 capsule by mouth daily.    Marland Kitchen tiotropium (SPIRIVA HANDIHALER) 18 MCG inhalation capsule Place 1 capsule into inhaler and inhale daily.    Marland Kitchen triamcinolone (NASACORT ALLERGY  24HR) 55 MCG/ACT AERO nasal inhaler Place 2 sprays into the nose daily.     No current facility-administered medications for this visit.      Review of Systems A complete review of systems was asked and was negative except for the following positive findings palpitations, peripheral edema, shortness of breath, cough, headaches, easy bruising, joint pain,  Blood pressure 147/85, pulse 79, temperature 97.7 F (36.5 C), height 5\' 3"  (1.6 m), weight 134 lb 11.2 oz (61.1 kg), SpO2 97 %.  Physical Exam CONSTITUTIONAL:  Pleasant, well-developed, well-nourished, and in no acute distress. EYES: Pupils equal and reactive to light, Sclera non-icteric EARS, NOSE, MOUTH AND THROAT:  The oropharynx was clear.  Dentition is good repair.  Oral mucosa pink and moist. LYMPH NODES:  Lymph nodes in the neck and axillae were normal RESPIRATORY:  Lungs were clear.  Normal respiratory effort without pathologic use of accessory muscles of respiration CARDIOVASCULAR: Heart was regular without murmurs.  There were no carotid bruits. GI: The abdomen was soft, nontender, and nondistended. There were no palpable masses. There was no hepatosplenomegaly. There were normal bowel sounds in all quadrants. GU:  Rectal deferred.   MUSCULOSKELETAL:  Normal muscle strength and tone.  No clubbing or cyanosis.   SKIN:  There were no pathologic skin lesions.  There were no nodules on palpation. NEUROLOGIC:  Sensation is normal.  Cranial nerves are grossly intact. PSYCH:  Oriented to person, place and time.  Mood and affect are normal.  Data Reviewed CT scans  I have personally reviewed the patient's imaging, laboratory findings and medical records.    Assessment    I have independently reviewed the patient's CT scan. There is a 1 cm lesion in the right lower lobe highly concerning for malignancy. This was not present on the prior scans. I had a long discussion with her today regarding the options. I am worried that this  may represent a malignancy and I have recommended that she undergo a CT-guided needle biopsy. We also discussed the role of surgery to remove the lesion or a wait and watch approach. She would like proceed on with CT-guided needle biopsy.    Plan    We will obtain the CT-guided needle biopsy and I'll see her back again in 2 weeks. We'll also get a set of pulmonary function studies as well. All of her questions were answered.       Nestor Lewandowsky, MD 08/10/2015, 10:13 AM

## 2015-08-22 ENCOUNTER — Ambulatory Visit: Payer: Medicare PPO

## 2015-08-23 ENCOUNTER — Other Ambulatory Visit: Payer: Self-pay | Admitting: Radiology

## 2015-08-24 ENCOUNTER — Ambulatory Visit: Payer: Medicare PPO | Admitting: Cardiothoracic Surgery

## 2015-08-24 ENCOUNTER — Other Ambulatory Visit: Payer: Self-pay | Admitting: *Deleted

## 2015-08-24 ENCOUNTER — Ambulatory Visit
Admission: RE | Admit: 2015-08-24 | Discharge: 2015-08-24 | Disposition: A | Discharge status: Home / Self Care | Payer: Medicare PPO | Source: Ambulatory Visit | Attending: Cardiothoracic Surgery | Admitting: Cardiothoracic Surgery

## 2015-08-24 DIAGNOSIS — E785 Hyperlipidemia, unspecified: Secondary | ICD-10-CM | POA: Diagnosis not present

## 2015-08-24 DIAGNOSIS — K219 Gastro-esophageal reflux disease without esophagitis: Secondary | ICD-10-CM | POA: Diagnosis not present

## 2015-08-24 DIAGNOSIS — Z7982 Long term (current) use of aspirin: Secondary | ICD-10-CM | POA: Insufficient documentation

## 2015-08-24 DIAGNOSIS — Z87891 Personal history of nicotine dependence: Secondary | ICD-10-CM | POA: Insufficient documentation

## 2015-08-24 DIAGNOSIS — I493 Ventricular premature depolarization: Secondary | ICD-10-CM | POA: Insufficient documentation

## 2015-08-24 DIAGNOSIS — R918 Other nonspecific abnormal finding of lung field: Secondary | ICD-10-CM | POA: Insufficient documentation

## 2015-08-24 DIAGNOSIS — E039 Hypothyroidism, unspecified: Secondary | ICD-10-CM | POA: Diagnosis not present

## 2015-08-24 DIAGNOSIS — Z801 Family history of malignant neoplasm of trachea, bronchus and lung: Secondary | ICD-10-CM | POA: Diagnosis not present

## 2015-08-24 DIAGNOSIS — E079 Disorder of thyroid, unspecified: Secondary | ICD-10-CM | POA: Insufficient documentation

## 2015-08-24 DIAGNOSIS — J449 Chronic obstructive pulmonary disease, unspecified: Secondary | ICD-10-CM | POA: Insufficient documentation

## 2015-08-24 DIAGNOSIS — Z01812 Encounter for preprocedural laboratory examination: Secondary | ICD-10-CM | POA: Diagnosis not present

## 2015-08-24 HISTORY — DX: Gastro-esophageal reflux disease without esophagitis: K21.9

## 2015-08-24 HISTORY — DX: Cardiac arrhythmia, unspecified: I49.9

## 2015-08-24 HISTORY — DX: Chronic obstructive pulmonary disease, unspecified: J44.9

## 2015-08-24 HISTORY — DX: Pneumonia, unspecified organism: J18.9

## 2015-08-24 HISTORY — DX: Hypothyroidism, unspecified: E03.9

## 2015-08-24 HISTORY — DX: Reserved for inherently not codable concepts without codable children: IMO0001

## 2015-08-24 LAB — PROTIME-INR
INR: 0.94
PROTHROMBIN TIME: 12.8 s (ref 11.4–15.0)

## 2015-08-24 LAB — CBC
HCT: 45.4 % (ref 35.0–47.0)
HEMOGLOBIN: 14.9 g/dL (ref 12.0–16.0)
MCH: 31.6 pg (ref 26.0–34.0)
MCHC: 32.8 g/dL (ref 32.0–36.0)
MCV: 96.4 fL (ref 80.0–100.0)
PLATELETS: 244 10*3/uL (ref 150–440)
RBC: 4.71 MIL/uL (ref 3.80–5.20)
RDW: 13.2 % (ref 11.5–14.5)
WBC: 4.9 10*3/uL (ref 3.6–11.0)

## 2015-08-24 LAB — APTT: aPTT: 26 seconds (ref 24–36)

## 2015-08-24 MED ORDER — SODIUM CHLORIDE 0.9 % IV SOLN: Freq: Once | INTRAVENOUS | Status: DC

## 2015-08-24 NOTE — Consult Note (Signed)
Chief Complaint: Patient was seen in consultation today for CT guided lung biopsy  Referring Physician(s): Oaks,Timothy  History of Present Illness: [name] [name] is a 74 y.o. female with past medical history significant for COPD, asthma and pneumonia who was found to have indeterminate pulmonary nodule within chest CT performed 08/01/2015, new since prior chest 07/11/2011. The patient is accompanied by her husband though serves as her own historian.  Patient was seen in consultation by Dr. Faith Rogue and referred to interventional radiology for CT-guided lung biopsy.  Patient is currently without complaint. She denies fever or chills. No cough or hemoptysis. No unintentional weight loss.  Past Medical History  Diagnosis Date  . Allergy   . Thyroid disease   . Hyperlipidemia   . PVC (premature ventricular contraction)   . GERD (gastroesophageal reflux disease)   . Dysrhythmia     PVC's  . COPD (chronic obstructive pulmonary disease) ([place])   . Shortness of breath dyspnea   . Asthma   . Pneumonia   . Hypothyroidism     Past Surgical History  Procedure Laterality Date  . Cholecystectomy  03/2004  . 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 Patelectomy Right   . Removal of medical meniscus  1972  . Laparotomy  1969    fertility testing  . Dilation and curettage of uterus  1968    fertility testing  . Appendectomy    . Abdominal surgery    . Eye surgery Bilateral 2015    cataract excision  . Blepharoplasty Bilateral 11/2014  . Breast cyst aspiration Left     Allergies: Codeine; Diphenhydramine; Montelukast sodium; Morphine sulfate; Nsaids; Tramadol; Celecoxib; and Rofecoxib  Medications: Prior to Admission medications   Medication Sig Start Date End Date Taking? Authorizing Provider  Acetaminophen 500 MG coapsule Take 2 capsules by mouth every 6 (six) hours as needed.   Yes Historical Provider, MD  aspirin 81 MG tablet  07/31/15   Yes Historical Provider, MD  bacitracin ophthalmic ointment APPLY A SMALL AMOUNT ON SUTURES 4 TIMES A DAY FOR 10-14 DAYS 08/03/15  Yes Historical Provider, MD  fexofenadine (ALLEGRA) 180 MG tablet Take 1 tablet by mouth daily. 12/13/05  Yes Historical Provider, MD  flecainide (TAMBOCOR) 50 MG tablet Take by mouth. 07/17/15 07/16/16 Yes Historical Provider, MD  Fluticasone-Salmeterol (ADVAIR DISKUS) 250-50 MCG/DOSE AEPB Inhale into the lungs. 1 puff BID daily 10/19/14  Yes Historical Provider, MD  levothyroxine (SYNTHROID, LEVOTHROID) 75 MCG tablet Take 1 tablet by mouth daily. 10/19/14  Yes Historical Provider, MD  MULTIPLE VITAMIN PO Take 1 tablet by mouth daily.   Yes Historical Provider, MD  tiotropium (SPIRIVA HANDIHALER) 18 MCG inhalation capsule Place 1 capsule into inhaler and inhale daily. 10/19/14  Yes Historical Provider, MD  triamcinolone (NASACORT ALLERGY 24HR) 55 MCG/ACT AERO nasal inhaler Place 2 sprays into the nose daily.   Yes Historical Provider, MD  albuterol (VENTOLIN HFA) 108 (90 BASE) MCG/ACT inhaler Inhale into the lungs. 2 puffs every 6 hours as needed 08/17/13   Historical Provider, MD  omeprazole (PRILOSEC) 20 MG capsule Take 1 capsule by mouth daily. Reported on 08/24/2015    Historical Provider, MD     Family History  Problem Relation Age of Onset  . Diabetes Mother   . Hypertension Mother   . Congestive Heart Failure Mother   . Heart disease Father   . Hypertension Father   . Hyperlipidemia Sister   . Heart  disease Sister   . Paget's disease of bone Sister   . Hyperthyroidism Sister   . Cancer Sister     Social History   Social History  . Marital Status: Married    Spouse Name: N/A  . Number of Children: N/A  . Years of Education: N/A   Social History Main Topics  . Smoking status: Former Smoker    Quit date: 08/25/1998  . Smokeless tobacco: Never Used  . Alcohol Use: Yes     Comment: occasionally  . Drug Use: No  . Sexual Activity: Not Asked    Other Topics Concern  . None   Social History Narrative    ECOG Status: 0 - Asymptomatic  Review of Systems: A 12 point ROS discussed and pertinent positives are indicated in the HPI above.  All other systems are negative.  Review of Systems  Vital Signs: BP 129/56 mmHg  Pulse 73  Temp(Src) 97.8 F (36.6 C) (Oral)  Resp 18  Ht 5\' 3"  (1.6 m)  Wt 130 lb (58.968 kg)  BMI 23.03 kg/m2  SpO2 97%  Physical Exam  Mallampati Score:     Imaging: Ct Chest W Contrast  08/01/2015  CLINICAL DATA:  Family history of lung cancer.  Follow-up nodule. EXAM: CT CHEST WITH CONTRAST TECHNIQUE: Multidetector CT imaging of the chest was performed during intravenous contrast administration. CONTRAST:  59mL OMNIPAQUE IOHEXOL 350 MG/ML SOLN COMPARISON:  07/11/2011 FINDINGS: Severe emphysematous changes are noted in the lungs. Linear densities persist in both upper lobes, right greater than left and are stable since prior study compatible with scarring. There is a new spiculated appearing pulmonary nodule in the right lower lobe measuring 10 mm on image 32, not present previously. This is concerning for malignancy. Linear densities in both lung bases. No pleural effusions. Calcified granuloma in the left upper lobe. Heart is normal size. Scattered coronary artery calcifications and aortic calcifications. No aortic aneurysm. There is a retroesophageal right subclavian artery. No mediastinal, hilar, or axillary adenopathy. Chest wall soft tissues are unremarkable. Imaging into the upper abdomen shows no acute findings. No acute bony abnormality or focal bone lesion. IMPRESSION: Severe emphysematous changes with scarring/ fibrotic changes in the upper lobes bilaterally, stable. New spiculated appearing 10 mm nodule in the right lower lobe. This is concerning for malignancy. Recommend PET CT and/or tissue sampling. Coronary artery disease.  Aortic atherosclerosis. Electronically Signed   By: Rolm Baptise M.D.    On: 08/01/2015 09:30   Dg Bone Density  07/25/2015  EXAM: DUAL X-RAY ABSORPTIOMETRY (DXA) FOR BONE MINERAL DENSITY IMPRESSION: Dear Dr. Venia Minks, Your patient [name] [name] completed a BMD test on 07/25/2015 using the [place] (analysis version: 14.10) manufactured by [employer]. The following summarizes the results of our evaluation. PATIENT BIOGRAPHICAL: Name: [name] [name], [name] [name] Patient ID: [id number] Birth Date: [birth year] Height: 62.0 in. Gender: Female Exam Date: 07/25/2015 Weight: 134.2 lbs. Indications: Advanced Age, Caucasian, COPD, Family Hist. (Parent hip fracture), Family History of Fracture, Family Hx of Osteoporosis, Height Loss, Hx of tobacco use, Postmenopausal Fractures: Treatments: Advair Inhaler, ASPRIN 81 MG, LEVOTHYROXINE, Multi-Vitamin with calcium, omeprazole, spiriva ASSESSMENT: The BMD measured at Femur Total Right is 0.781 g/cm2 with a T-score of -1.8. This patient is considered OSTEOPENIC according to [facility]) criteria. Site Region Measured Measured WHO Young Adult BMD Date       Age      Classification T-score AP Spine L1-L4 07/25/[age] Osteopenia -1.3 1.032 g/cm2 DualFemur Total Right  07/25/2015 74.1 Osteopenia -1.8 0.781 g/cm2 World Health Organization [facility]) criteria for post-menopausal, Caucasian Women: Normal:       T-score at or above -1 SD Osteopenia:   T-score between -1 and -2.5 SD Osteoporosis: T-score at or below -2.5 SD RECOMMENDATIONS: [place] recommends that FDA-approved medical therapies be considered in postmenopausal women and men age 54 or older with a: 1. Hip or vertebral (clinical or morphometric) fracture. 2. T-score of < -2.5 at the spine or hip. 3. Ten-year fracture probability by FRAX of 3% or greater for hip fracture or 20% or greater for major osteoporotic fracture. All treatment decisions require clinical judgment and consideration of individual patient factors, including patient preferences,  co-morbidities, previous drug use, risk factors not captured in the FRAX model (e.g. falls, vitamin D deficiency, increased bone turnover, interval significant decline in bone density) and possible under - or over-estimation of fracture risk by FRAX. All patients should ensure an adequate intake of dietary calcium (1200 mg/d) and vitamin D (800 IU daily) unless contraindicated. FOLLOW-UP: People with diagnosed cases of osteoporosis or at high risk for fracture should have regular bone mineral density tests. For patients eligible for Medicare, routine testing is allowed once every 2 years. The testing frequency can be increased to one year for patients who have rapidly progressing disease, those who are receiving or discontinuing medical therapy to restore bone mass, or have additional risk factors. I have reviewed this report, and agree with the above findings. Mark A. Thornton Papas, M.D. [facility] Dear Dr. Venia Minks, Your patient [name] [name] completed a FRAX assessment on 07/25/2015 using the [place] (analysis version: 14.10) manufactured by [employer]. The following summarizes the results of our evaluation. PATIENT BIOGRAPHICAL: Name: [name] [name], [name] [name] Patient ID: [id number] Birth Date: 09/21/1940 Height:    62.0 in. Gender:     [age]    [age]       Weight:    134.2 lbs. Ethnicity:  White                            Exam Date: 07/25/2015 FRAX* RESULTS:  (version: 3.5) 10-year Probability of Fracture1 Major Osteoporotic Fracture2 Hip Fracture 17.2% 7.3% Population: [place] (Caucasian) Risk Factors: Family Hist. (Parent hip fracture) Based on Femur (Right) Neck BMD 1 -The 10-year probability of fracture may be lower than reported if the patient has received treatment. 2 -Major Osteoporotic Fracture: Clinical Spine, Forearm, Hip or Shoulder *FRAX is a Materials engineer of the [employer] for Metabolic Bone Disease, a [place] (WHO) [employer]. ASSESSMENT: The probability of a major osteoporotic fracture is 17.2% within the next ten years. The probability of a hip fracture is 7.3% within the next ten years. I have reviewed this report and agree with the above findings. Mark A. Thornton Papas, M.D. [facility] Electronically Signed   By: Lavonia Dana M.D.   On: 07/25/2015 14:33   Mm Screening Breast Tomo Bilateral  07/25/2015  CLINICAL DATA:  Screening. EXAM: DIGITAL SCREENING BILATERAL MAMMOGRAM WITH 3D TOMO WITH CAD COMPARISON:  Previous exam(s). ACR Breast Density Category c: The breast tissue is heterogeneously dense, which may obscure small masses. FINDINGS: There are no findings suspicious for malignancy. Images were processed with CAD. IMPRESSION: No mammographic evidence of malignancy. A result letter of this screening mammogram will be mailed directly to the patient. RECOMMENDATION: Screening mammogram in  one year. (Code:SM-B-01Y) BI-RADS CATEGORY  1: Negative. Electronically Signed   By: Claudie Revering M.D.   On: 07/25/2015 15:20    Labs:  CBC:  Recent Labs  06/30/15 0912 08/10/15 1032 08/24/15 0830  WBC 5.2 5.3 4.9  HGB  --  14.7 14.9  HCT 42.8 43.4 45.4  PLT  --  275 244    COAGS:  Recent Labs  08/10/15 1032 08/24/15 0830  INR 0.94 0.94  APTT 28 26    BMP:  Recent Labs  06/30/15 0912  NA 137  K 4.1  CL 99  CO2 25  GLUCOSE 85  BUN 14  CALCIUM 9.4  CREATININE 0.72  GFRNONAA 83  GFRAA 95    LIVER FUNCTION TESTS:  Recent Labs  06/30/15 0912  BILITOT 1.1  AST 26  ALT 27  ALKPHOS 56  PROT 6.3  ALBUMIN 4.3    TUMOR MARKERS: No results for input(s): AFPTM, CEA, CA199, CHROMGRNA in the last 8760 hours.  Assessment and Plan:  CYNDA CRIVELLO is a 74 y.o. female with past medical history significant for COPD, asthma and pneumonia who was found to have indeterminate pulmonary nodule within chest CT performed 08/01/2015, new since prior chest 07/11/2011.  Review of most recent chest CT  performed 12/6S 2016 demonstrates a new linear approximately 1 cm within the right lower lobe.  Re demonstrated severe mixed paraseptal and centrilobular emphysematous change with biapical pleural parenchymal thickening.  Scattered mediastinal lymph nodes and right lymph nodes are not enlarged by size criteria with index right suprahilar lymph node measuring 0.7 cm in greatest diameter.  Risks and Benefits were discussed with the patient including, but not limited to nondiagnostic biopsy, bleeding, hemoptysis, respiratory failure requiring intubation, infection, pneumothorax requiring chest tube placement, stroke from air embolism or even death.  All of the patient's questions were answered.  Following this detailed discussion, the patient wishes to postpone the biopsy in lieu of obtaining a preprocedural PET/CT.   The PET/CT will be helpful to ensure this indeterminate nodule demonstrates hypermetabolic activity and to ensure there is no more easily accessible biopsy sites outside of the thorax.  Of note, the patient is also scheduled to undergo preoperative PFTs later this week.  Following the acquisition of the PET scan as well as the PFTs, the patient will again be seen in consultation by Dr. Genevive Bi.   At that time, Dr. Genevive Bi may consider referring the patient for attempted CT-guided biopsy, empiric radiation therapy versus proceeding with surgical resection as indicated.  The patient, the patient's husband as well as referring thoracic surgeon, Dr. Genevive Bi (whom I spoke with preceding this consultation), are in agreement with this plan of care.  A copy of this report was sent to the requesting provider on this date.  SignedSandi Mariscal 08/24/2015, 9:28 AM   I spent a total of 15 Minutes in face to face in clinical consultation, greater than 50% of which was counseling/coordinating care for CT-guided lung biopsy

## 2015-08-24 NOTE — Progress Notes (Signed)
Dr. Pascal Lux discussed procedure with patient and her husband, as well as, Dr. Genevive Bi.  Pt. To have PET scan and PFT's prior to decision on doing biopsy.  Patient and husband agrees.

## 2015-08-29 ENCOUNTER — Ambulatory Visit: Payer: PPO | Attending: Cardiothoracic Surgery

## 2015-08-29 ENCOUNTER — Other Ambulatory Visit: Payer: Self-pay | Admitting: Cardiothoracic Surgery

## 2015-08-29 DIAGNOSIS — R918 Other nonspecific abnormal finding of lung field: Secondary | ICD-10-CM | POA: Diagnosis not present

## 2015-08-29 MED ORDER — ALBUTEROL SULFATE (2.5 MG/3ML) 0.083% IN NEBU
2.5000 mg | INHALATION_SOLUTION | Freq: Once | RESPIRATORY_TRACT | Status: AC
  Administered 2015-08-29: 2.5 mg via RESPIRATORY_TRACT
  Filled 2015-08-29: qty 3

## 2015-08-31 ENCOUNTER — Inpatient Hospital Stay: Payer: PPO | Attending: Cardiothoracic Surgery | Admitting: Cardiothoracic Surgery

## 2015-08-31 ENCOUNTER — Encounter: Payer: Self-pay | Admitting: Cardiothoracic Surgery

## 2015-08-31 VITALS — BP 153/73 | HR 72 | Temp 97.7°F | Wt 133.6 lb

## 2015-08-31 DIAGNOSIS — R918 Other nonspecific abnormal finding of lung field: Secondary | ICD-10-CM | POA: Diagnosis not present

## 2015-08-31 DIAGNOSIS — R911 Solitary pulmonary nodule: Secondary | ICD-10-CM | POA: Insufficient documentation

## 2015-08-31 NOTE — Addendum Note (Signed)
Addended by: Nestor Lewandowsky E on: 08/31/2015 10:10 AM   Modules accepted: Orders

## 2015-08-31 NOTE — Progress Notes (Signed)
[facility]  Patient ID: MALETA LERMA, female   DOB: 1941/04/23, 75 y.o.   MRN: [id number]  HISTORY: This patient is a 75 year old woman who presents with a right lower lobe mass. She had a CT scan showing a 1 cm lesion in the right lower lobe highly suspicious for a malignancy. She did have some pulmonary function studies done which I have independently reviewed. This reveals an FEV1 of 73% and a DLCO of 48%. She has an extensive history of smoking and severe bilateral upper lobe emphysema. She was scheduled to have a CT-guided needle biopsy last week with the radiologist was quite concerned about the ability to make a diagnosis on transthoracic needle biopsy without complications. Therefore this approach was abandoned and the patient comes in today to discuss additional treatment and diagnostic options. She states that she was able to walk up a flight of stairs today but was quite winded at the top. She denied any recent fevers or chills. She does not smoke any longer.   Filed Vitals:   08/31/15 0920  BP: 153/73  Pulse: 72  Temp: 97.7 F (36.5 C)     EXAM: Resp: Lungs are clear bilaterally.  No respiratory distress, normal effort. Heart:  Regular without murmurs Abd:  Abdomen is soft, non distended and non tender. No masses are palpable.  There is no rebound and no guarding.  Neurological: Alert and oriented to person, place, and time. Coordination normal.  Skin: Skin is warm and dry. No rash noted. No diaphoretic. No erythema. No pallor.  Psychiatric: Normal mood and affect. Normal behavior. Judgment and thought content normal.    ASSESSMENT: I have independently reviewed the patient's CT scan with her and her husband. I also reviewed the FEV1 and a DLCO with her. I discussed the options with her which included this point in time several. First we could repeat the scan in 2 months to see if there is any change in the lesion. Second we could pursue a PET scan which will  not give [place] a diagnosis but female and some additional information. Finally we could proceed with a CT-guided needle biopsy.   PLAN:   After extensive discussion with the patient and her husband have elected to think about their options and will contact us later today. I told her that I did not think that direct surgical resection would be indicated unless a more thorough review was performed because of the severe emphysema bilaterally in the upper lobes. She understands my concern. We will await their response.    Nestor Lewandowsky, MD

## 2015-09-04 ENCOUNTER — Ambulatory Visit
Admission: RE | Admit: 2015-09-04 | Discharge: 2015-09-04 | Disposition: A | Discharge status: Home / Self Care | Payer: PPO | Source: Ambulatory Visit | Attending: Cardiothoracic Surgery | Admitting: Cardiothoracic Surgery

## 2015-09-04 DIAGNOSIS — J432 Centrilobular emphysema: Secondary | ICD-10-CM | POA: Diagnosis not present

## 2015-09-04 DIAGNOSIS — D3502 Benign neoplasm of left adrenal gland: Secondary | ICD-10-CM | POA: Diagnosis not present

## 2015-09-04 DIAGNOSIS — R911 Solitary pulmonary nodule: Secondary | ICD-10-CM | POA: Diagnosis not present

## 2015-09-04 DIAGNOSIS — R918 Other nonspecific abnormal finding of lung field: Secondary | ICD-10-CM | POA: Diagnosis not present

## 2015-09-04 DIAGNOSIS — Z0189 Encounter for other specified special examinations: Secondary | ICD-10-CM | POA: Insufficient documentation

## 2015-09-04 DIAGNOSIS — J329 Chronic sinusitis, unspecified: Secondary | ICD-10-CM | POA: Insufficient documentation

## 2015-09-04 DIAGNOSIS — N839 Noninflammatory disorder of ovary, fallopian tube and broad ligament, unspecified: Secondary | ICD-10-CM | POA: Insufficient documentation

## 2015-09-04 LAB — GLUCOSE, CAPILLARY: Glucose-Capillary: 67 mg/dL (ref 65–99)

## 2015-09-04 MED ORDER — FLUDEOXYGLUCOSE F - 18 (FDG) INJECTION: 12.4700 | Freq: Once | INTRAVENOUS | Status: DC | PRN

## 2015-09-05 ENCOUNTER — Ambulatory Visit (INDEPENDENT_AMBULATORY_CARE_PROVIDER_SITE_OTHER): Payer: PPO | Admitting: Family Medicine

## 2015-09-05 ENCOUNTER — Encounter: Payer: Self-pay | Admitting: Family Medicine

## 2015-09-05 VITALS — BP 142/70 | HR 68 | Temp 97.0°F | Resp 16 | Ht 62.25 in | Wt 135.0 lb

## 2015-09-05 DIAGNOSIS — R911 Solitary pulmonary nodule: Secondary | ICD-10-CM | POA: Diagnosis not present

## 2015-09-05 DIAGNOSIS — R21 Rash and other nonspecific skin eruption: Secondary | ICD-10-CM

## 2015-09-05 DIAGNOSIS — M810 Age-related osteoporosis without current pathological fracture: Secondary | ICD-10-CM | POA: Diagnosis not present

## 2015-09-05 NOTE — Progress Notes (Signed)
Patient ID: [name] [name], female   DOB: 1941-01-18, 75 y.o.   MRN: [id number]        Patient: [name] [name] Female    DOB: 1941/05/25   75 y.o.   MRN: [id number] Visit Date: 09/05/2015  Today's Provider: Margarita Rana, MD   Chief Complaint  Patient presents with  . Follow-up    bone density   . Rash    black spot on top off head   Subjective:    HPI Osteoporosis: Patient complains of osteoporosis. She was diagnosed with osteoporosis by bone density scan in 07/25/2015. Patient does history of fracture (1980's rib fracture).The cause of osteoporosis is felt to be due to post-menopausal, smoking.   She is not currently being treated with calcium and vitamin D supplementation.  She is not currently being treated with bisphosphonate's  Osteoporosis Risk Factors  Non modifiable Personal Hx of fracture as an adult: no Hx of fracture in first-degree relative: yes - mother, hip and humerus  Caucasian race: yes Advanced age: yes Female sex: yes Dementia: no Poor health/frailty: no  Potentially modifiable: Tobacco use: yes - former quit in 1999 Low body weight (<127 lbs): no Estrogen deficiency  early menopause (age <45) or bilateral ovariectomy: no  prolonged premenopausal amenorrhea (>1 yr): no Low calcium intake (lifelong): no Alcoholism: no Recurrent falls: no Inadequate physical activity: no  Current calcium and Vit D intake: Dietary sources: well balanced diet Supplements: multivitamin  Rash: Patient complains of rash involving the head. Rash started unknown. Appearance of rash at onset: Color of lesion(s): black. Rash has not changed over time Initial distribution: head.  Discomfort associated with rash: causes no discomfort.  Associated symptoms: none. Denies: abdominal pain. Patient has not had previous evaluation of rash. Patient has not had previous treatment. Patient has not had contacts with similar rash. Patient has not identified precipitant. Patient has not had  new exposures (soaps, lotions, laundry detergents, foods, medications, plants, insects or animals.)      Allergies  Allergen Reactions  . Codeine   . Diphenhydramine     Decreased BP and pulse rate  . Montelukast Sodium     Insomnia  . Morphine Sulfate   . Nsaids     Can take Meloxicam, Naproxen  . Tramadol     Mental status change  . Celecoxib Rash    Epigastric pain  . Rofecoxib Rash    Epigastric pain   Previous Medications   ACETAMINOPHEN 500 MG COAPSULE    Take 1 capsule by mouth every 6 (six) hours as needed.    ALBUTEROL (VENTOLIN HFA) 108 (90 BASE) MCG/ACT INHALER    Inhale into the lungs. 2 puffs every 6 hours as needed   ASPIRIN 81 MG TABLET    Take 81 mg by mouth daily.    FEXOFENADINE (ALLEGRA) 180 MG TABLET    Take 1 tablet by mouth daily.   FLECAINIDE (TAMBOCOR) 50 MG TABLET    Take 50 mg by mouth once.    FLUTICASONE-SALMETEROL (ADVAIR DISKUS) 250-50 MCG/DOSE AEPB    Inhale into the lungs. 1 puff BID daily   LEVOTHYROXINE (SYNTHROID, LEVOTHROID) 75 MCG TABLET    Take 1 tablet by mouth daily.   MULTIPLE VITAMIN PO    Take 1 tablet by mouth daily.   OMEPRAZOLE (PRILOSEC) 20 MG CAPSULE    Take 1 capsule by mouth as needed. Reported on 08/24/2015   SENNOSIDES-DOCUSATE SODIUM (SENOKOT-S) 8.6-50 MG TABLET    Take 1 tablet by mouth  daily.   TIOTROPIUM (SPIRIVA HANDIHALER) 18 MCG INHALATION CAPSULE    Place 1 capsule into inhaler and inhale daily.   TRIAMCINOLONE (NASACORT ALLERGY 24HR) 55 MCG/ACT AERO NASAL INHALER    Place 2 sprays into the nose daily.    Review of Systems  Constitutional: Negative.   Respiratory: Negative.   Cardiovascular: Negative.   Musculoskeletal: Positive for back pain.  Skin: Positive for rash.    Social History  Substance Use Topics  . Smoking status: Former Smoker    Quit date: 08/25/1998  . Smokeless tobacco: Never Used  . Alcohol Use: 1.2 oz/week    2 Glasses of wine per week     Comment: occasionally   Objective:   BP  142/70 mmHg  Pulse 68  Temp(Src) 97 F (36.1 C) (Oral)  Resp 16  Ht 5' 2.25" (1.581 m)  Wt 135 lb (61.236 kg)  BMI 24.50 kg/m2  SpO2 97%  Physical Exam  Constitutional: She is oriented to person, place, and time. She appears well-developed and well-nourished.  Cardiovascular: Normal rate and regular rhythm.   Pulmonary/Chest: Effort normal and breath sounds normal.  Musculoskeletal:  Has lesion on scalp.   Neurological: She is alert and oriented to person, place, and time.        Assessment & Plan:     1. Osteoporosis New diagnosis. Does not want treatment at this time. Will check Vit D and treat as needed.  Continue weight bearing exercise. Reviewed adequate Calcium in diet and will check Vit D levels.  - VITAMIN D 25 Hydroxy (Vit-D Deficiency, Fractures)  2. Rash Lesion on head. Will refer to Dermatology.  - Ambulatory referral to Dermatology     3. Lung nodule, solitary Spent a lot of time with patient discussing her current stress. Does not want medical intervention at this time. Will call if changes her mind.   Patient was seen and examined by Jerrell Belfast, MD, and note scribed by Lynford Humphrey, [place].   I have reviewed the document for accuracy and completeness and I agree with above. - Jerrell Belfast, MD  Margarita Rana, MD  [facility]

## 2015-09-05 NOTE — Patient Instructions (Addendum)
Please start over the counter senna-s for constipation. Continue daily weight bearing exercise.

## 2015-09-06 ENCOUNTER — Telehealth: Payer: Self-pay

## 2015-09-06 LAB — VITAMIN D 25 HYDROXY (VIT D DEFICIENCY, FRACTURES): Vit D, 25-Hydroxy: 32.7 ng/mL (ref 30.0–100.0)

## 2015-09-06 NOTE — Telephone Encounter (Signed)
-----   Message from Margarita Rana, MD sent at 09/06/2015  8:36 AM EST ----- Vitamin D is low normal.  Please notify patient. Thanks.

## 2015-09-06 NOTE — Telephone Encounter (Signed)
Patient advised as directed below. Patient verbalized understanding.  

## 2015-09-07 ENCOUNTER — Encounter: Payer: Self-pay | Admitting: Family Medicine

## 2015-09-07 ENCOUNTER — Other Ambulatory Visit: Payer: Self-pay | Admitting: *Deleted

## 2015-09-07 ENCOUNTER — Inpatient Hospital Stay (HOSPITAL_BASED_OUTPATIENT_CLINIC_OR_DEPARTMENT_OTHER): Payer: PPO | Admitting: Cardiothoracic Surgery

## 2015-09-07 ENCOUNTER — Inpatient Hospital Stay: Payer: PPO | Admitting: Cardiothoracic Surgery

## 2015-09-07 ENCOUNTER — Telehealth: Payer: Self-pay | Admitting: Family Medicine

## 2015-09-07 VITALS — BP 131/69 | HR 77 | Temp 97.8°F

## 2015-09-07 DIAGNOSIS — R918 Other nonspecific abnormal finding of lung field: Secondary | ICD-10-CM

## 2015-09-07 DIAGNOSIS — D279 Benign neoplasm of unspecified ovary: Secondary | ICD-10-CM

## 2015-09-07 DIAGNOSIS — R911 Solitary pulmonary nodule: Secondary | ICD-10-CM | POA: Diagnosis not present

## 2015-09-07 NOTE — Telephone Encounter (Signed)
Pt is requesting a call from Dr Venia Minks.  Pt states she has some news to share with Dr Venia Minks.  CB#[phone]/MW

## 2015-09-07 NOTE — Progress Notes (Signed)
[name] [name] Inpatient Post-Op Note  Patient ID: [name] [name], female   DOB: 31-Jan-1941, 75 y.o.   MRN: [id number]  HISTORY: She returns in follow-up. She's had no intercurrent problems. There is been no fevers chills or night sweats.   Filed Vitals:   09/07/15 1343  BP: 131/69  Pulse: 77  Temp: 97.8 F (36.6 C)     EXAM: Resp: Lungs are clear bilaterally.  No respiratory distress, normal effort. Heart:  Regular without murmurs Abd:  Abdomen is soft, non distended and non tender. No masses are palpable.  There is no rebound and no guarding.  Neurological: Alert and oriented to person, place, and time. Coordination normal.  Skin: Skin is warm and dry. No rash noted. No diaphoretic. No erythema. No pallor.  Psychiatric: Normal mood and affect. Normal behavior. Judgment and thought content normal.    ASSESSMENT: She did have a PET scan performed for evaluation of a right lower lobe nodule. The PET scan showed did diminish size of the nodule from 10-5 mm. The remaining nodules are either stable or new. In addition there was a left ovarian mass which was mildly metabolic on PET. She states she does have a history of a blocked fallopian tube on that side. She has not seen a gynecologist for this in many years.   PLAN:   I explained to her that I thought we should repeat her chest CT in 3 months since some of the nodules are still present. In addition I would like to make a referral to our GYN oncologist further evaluation of the left ovarian mass. We will see her back again in 3 months.    Nestor Lewandowsky, MD

## 2015-09-07 NOTE — Telephone Encounter (Signed)
Lung mass decreased.  Repeat scan in 3 months.   Does have ovarian nodule and is seeing specialist for this. Will keep me notified.  Thanks.

## 2015-09-26 DIAGNOSIS — L578 Other skin changes due to chronic exposure to nonionizing radiation: Secondary | ICD-10-CM | POA: Diagnosis not present

## 2015-09-26 DIAGNOSIS — D485 Neoplasm of uncertain behavior of skin: Secondary | ICD-10-CM | POA: Diagnosis not present

## 2015-09-26 DIAGNOSIS — D234 Other benign neoplasm of skin of scalp and neck: Secondary | ICD-10-CM | POA: Diagnosis not present

## 2015-10-04 ENCOUNTER — Inpatient Hospital Stay: Payer: PPO | Attending: Obstetrics and Gynecology

## 2015-10-04 ENCOUNTER — Other Ambulatory Visit: Payer: Self-pay | Admitting: *Deleted

## 2015-10-04 VITALS — Wt 134.5 lb

## 2015-10-04 DIAGNOSIS — N838 Other noninflammatory disorders of ovary, fallopian tube and broad ligament: Secondary | ICD-10-CM

## 2015-10-04 DIAGNOSIS — M810 Age-related osteoporosis without current pathological fracture: Secondary | ICD-10-CM | POA: Insufficient documentation

## 2015-10-04 DIAGNOSIS — K219 Gastro-esophageal reflux disease without esophagitis: Secondary | ICD-10-CM | POA: Diagnosis not present

## 2015-10-04 DIAGNOSIS — D3912 Neoplasm of uncertain behavior of left ovary: Secondary | ICD-10-CM | POA: Insufficient documentation

## 2015-10-04 DIAGNOSIS — J432 Centrilobular emphysema: Secondary | ICD-10-CM | POA: Insufficient documentation

## 2015-10-04 DIAGNOSIS — R918 Other nonspecific abnormal finding of lung field: Secondary | ICD-10-CM | POA: Diagnosis not present

## 2015-10-04 DIAGNOSIS — E039 Hypothyroidism, unspecified: Secondary | ICD-10-CM

## 2015-10-04 DIAGNOSIS — Z87891 Personal history of nicotine dependence: Secondary | ICD-10-CM | POA: Diagnosis not present

## 2015-10-04 DIAGNOSIS — M542 Cervicalgia: Secondary | ICD-10-CM

## 2015-10-04 NOTE — Progress Notes (Signed)
Assisted MD with pelvic exam

## 2015-10-04 NOTE — Progress Notes (Addendum)
Gynecologic Oncology Consult Visit   Referring Provider: Dr Genevive Bi  Chief Concern: PET positive ovary found after CT scan for lung cancer screening.  Subjective:  [name] [name] is a 75 y.o. female who is seen in consultation from Dr. Genevive Bi for PET positive left ovary.  She presented to Dr Genevive Bi with a right lower lobe mass. She is a smoker and had a screening CT scan showing a 1 cm lesion in the right lower lobe highly suspicious for a malignancy. She did have some pulmonary function studies done which I have independently reviewed. This reveals an FEV1 of 73% and a DLCO of 48%. She has an extensive history of smoking and severe bilateral upper lobe emphysema. She was scheduled to have a CT-guided needle biopsy and the radiologist was quite concerned about the ability to make a diagnosis on transthoracic needle biopsy without complications. Therefore this approach was abandoned. She states that she was able to walk up a flight of stairs today but was quite winded at the top. She denied any recent fevers or chills. She does not smoke any longer.    PET last month showed Interval decreased size of the 1.0 cm right lower lobe pulmonary nodule described on the 08/01/2015 chest CT study, now 0.5 cm, which demonstrates no significant metabolism, in keeping with a resolving benign inflammatory nodule.  Plan is for further follow up, as this is reassuring and she is not a good surgical candidate. Follow up PET/CT last month also showed a complex partially cystic and partially calcified 1.5 cm left ovarian mass (series 3/image 199) with associated hypermetabolism (max SUV 4.9), which is mildly increased in size from 1.1 cm on 12/20/2008 CT abdomen/pelvis. No abnormal free fluid in the pelvis.  Patient denies any pelvic complaints.  No prior Gyn surgery.  PET/CT 09/04/15 IMPRESSION: 1. Interval decreased size of the 1.0 cm right lower lobe pulmonary nodule described on the 08/01/2015 chest CT study, now 0.5 cm,  which demonstrates no significant metabolism, in keeping with a resolving benign inflammatory nodule. 2. Three additional 0.4 cm pulmonary nodules, which are stable or new since 08/01/2015, none of which demonstrate significant metabolism, however which are below PET resolution. 3. Mildly hypermetabolic AP window mediastinal lymph node, which is stable in size since 08/01/2015 and nonspecific. A follow-up chest CT with IV contrast is recommended in 3 months, at which time the pulmonary nodules can also be reassessed. 4. Mildly hypermetabolic small left ovarian mass with cystic and calcified components, which is mildly increased in size since 12/20/2008 CT. A left ovarian neoplasm cannot be excluded. Consider further evaluation with transabdominal/transvaginal pelvic sonography and/or pelvic MRI with and without intravenous contrast.  5. Additional chronic findings including small left adrenal adenoma, moderate to severe centrilobular emphysema and chronic paranasal sinusitis.   Problem List: Patient Active Problem List   Diagnosis Date Noted  . Osteoporosis 09/05/2015  . Skin lesion 08/01/2015  . Chronic infection of sinus 06/30/2015  . Cramp in limb 06/30/2015  . H/O varicella 06/30/2015  . Other skin changes 06/30/2015  . Extremity pain 06/30/2015  . Encounter for general adult medical examination without abnormal findings 06/30/2015  . Lung nodule, solitary 06/30/2015  . Infection of urinary tract 06/30/2015  . Chronic obstructive pulmonary disease with acute exacerbation ([place]) 06/30/2015  . Pneumonia due to infectious agent 06/30/2015  . Chronic obstructive pulmonary disease ([place]) 05/29/2015  . Allergic rhinitis 05/26/2015  . Bilateral cataracts 05/26/2015  . 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 H/O respiratory  system disease 05/26/2015  . Hypercholesteremia 05/26/2015  . Adult hypothyroidism 05/26/2015  . Cramps of lower extremity 05/26/2015  . Hemorrhoids, internal 05/26/2015   . Cervical pain 05/26/2015  . Osteopenia 05/26/2015  . 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 Acquired trigger finger 05/26/2015  . Disease of thyroid gland 05/26/2015  . Spasm 05/26/2015  . Diverticulitis 05/26/2015    Past Medical History: Past Medical History  Diagnosis Date  . Allergy   . Thyroid disease   . Hyperlipidemia   . PVC (premature ventricular contraction)   . GERD (gastroesophageal reflux disease)   . Dysrhythmia     PVC's  . COPD (chronic obstructive pulmonary disease) ([place])   . Shortness of breath dyspnea   . Asthma   . Pneumonia   . Hypothyroidism     Past Surgical History: Past Surgical History  Procedure Laterality Date  . Cholecystectomy  03/2004  . 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 Patelectomy Right   . Removal of medical meniscus  1972  . Laparotomy  1969    fertility testing  . Dilation and curettage of uterus  1968    fertility testing  . Appendectomy    . Abdominal surgery    . Blepharoplasty Bilateral 11/2014    left eyelid 08/03/2015 Dr. Vickki Muff  . Breast cyst aspiration Left   . Eye surgery Bilateral 2015    cataract excision    OB History:  OB History  No data available    Family History: Family History  Problem Relation Age of Onset  . Diabetes Mother   . Hypertension Mother   . Congestive Heart Failure Mother   . Heart disease Father   . Hypertension Father   . Hyperlipidemia Sister   . Heart disease Sister   . Paget's disease of bone Sister   . Hyperthyroidism Sister   . Cancer Sister     Social History: Social History   Social History  . Marital Status: Married    Spouse Name: N/A  . Number of Children: N/A  . Years of Education: N/A   Occupational History  . Not on file.   Social History Main Topics  . Smoking status: Former Smoker    Quit date: 08/25/1998  . Smokeless tobacco: Never Used  . Alcohol Use: 1.2 oz/week    2 Glasses of wine per week      Comment: occasionally  . Drug Use: No  . Sexual Activity: Not on file   Other Topics Concern  . Not on file   Social History Narrative    Allergies: Allergies  Allergen Reactions  . Codeine   . Diphenhydramine     Decreased BP and pulse rate  . Montelukast Sodium     Insomnia  . Morphine Sulfate   . Nsaids     Can take Meloxicam, Naproxen  . Tramadol     Mental status change  . Celecoxib Rash    Epigastric pain  . Rofecoxib Rash    Epigastric pain    Current Medications: Current Outpatient Prescriptions  Medication Sig Dispense Refill  . Acetaminophen 500 MG coapsule Take 1 capsule by mouth every 6 (six) hours as needed.     Marland Kitchen albuterol (VENTOLIN HFA) 108 (90 BASE) MCG/ACT inhaler Inhale into the lungs. 2 puffs every 6 hours as needed    . aspirin 81 MG tablet Take 81 mg by mouth daily.     . fexofenadine (ALLEGRA) 180 MG tablet  Take 1 tablet by mouth daily.    . flecainide (TAMBOCOR) 50 MG tablet Take 50 mg by mouth once.     . Fluticasone-Salmeterol (ADVAIR DISKUS) 250-50 MCG/DOSE AEPB Inhale into the lungs. 1 puff BID daily    . levothyroxine (SYNTHROID, LEVOTHROID) 75 MCG tablet Take 1 tablet by mouth daily.    . MULTIPLE VITAMIN PO Take 1 tablet by mouth daily.    Marland Kitchen omeprazole (PRILOSEC) 20 MG capsule Take 1 capsule by mouth as needed. Reported on 08/24/2015    . sennosides-docusate sodium (SENOKOT-S) 8.6-50 MG tablet Take 1 tablet by mouth daily.    Marland Kitchen tiotropium (SPIRIVA HANDIHALER) 18 MCG inhalation capsule Place 1 capsule into inhaler and inhale daily.    Marland Kitchen triamcinolone (NASACORT ALLERGY 24HR) 55 MCG/ACT AERO nasal inhaler Place 2 sprays into the nose daily.     No current facility-administered medications for this visit.    Review of Systems Per HPI  Objective:  Physical Examination:  Wt 134 lb 7.7 oz (61 kg)   ECOG Performance Status: 1 - Symptomatic but completely ambulatory  General appearance: alert, cooperative and appears stated  age HEENT:PERRLA and thyroid without masses Lymph node survey: non-palpable, axillary, inguinal, supraclavicular Cardiovascular: regular rate and rhythm, no murmurs or gallops Respiratory: normal air entry, lungs clear to auscultation Abdomen: soft, non-tender, without masses or organomegaly, no hernias and well healed incision Back: inspection of back is normal Extremities: extremities normal, atraumatic, no cyanosis or edema Skin exam - normal coloration and turgor, no rashes, no suspicious skin lesions noted. Neurological exam reveals alert, oriented, normal speech, no focal findings or movement disorder noted.  Pelvic: exam chaperoned by nurse;  Vulva: normal appearing vulva with no masses, tenderness or lesions; Vagina: normal vagina; Adnexa: normal adnexa in size, nontender and no masses; Uterus: uterus is normal size, shape, consistency and nontender; Cervix: no lesions; Rectal: normal rectal, no masses     Assessment:  [name] [name] is a 75 y.o. female diagnosed with 1.5 cm left ovarian PET positive lesion found on PET/CT done for follow up of lung nodule on screening CT scan performed because she is a former smoker.  The lesion in the ovary was noted on a CT scan in 2010 and is only slightly larger. Most likely this is an incidental finding even though it is mildly PET positive.   Plan:   Problem List Items Addressed This Visit      Other   Cervical pain - Primary   Relevant Orders   US Transvaginal Non-OB   Ovarian mass, left      We discussed options for management including getting a pelvic ultrasound to better characterize the left ovary. We will also check a CA125 at that time.   These results will likely be reassuring and the ovarian mass can be followed on future scans. We will see her back in 3 months.   The patient's diagnosis, an outline of the further diagnostic and laboratory studies which will be required, the recommendation, and alternatives were discussed.  All  questions were answered to the patient's satisfaction.  A total of 30 minutes were spent with the patient/family today; 30% was spent in education, counseling and coordination of care for PET positive ovary.    Mellody Drown, MD   CC:  Margarita Rana, MD [address] [place], [place] [address] [phone]

## 2015-12-04 ENCOUNTER — Encounter: Payer: Self-pay | Admitting: Family Medicine

## 2015-12-04 ENCOUNTER — Ambulatory Visit (INDEPENDENT_AMBULATORY_CARE_PROVIDER_SITE_OTHER): Payer: PPO | Admitting: Family Medicine

## 2015-12-04 VITALS — BP 128/64 | HR 80 | Temp 97.4°F | Resp 14 | Wt 134.0 lb

## 2015-12-04 DIAGNOSIS — S81801A Unspecified open wound, right lower leg, initial encounter: Secondary | ICD-10-CM | POA: Diagnosis not present

## 2015-12-04 DIAGNOSIS — K5909 Other constipation: Secondary | ICD-10-CM

## 2015-12-04 DIAGNOSIS — E039 Hypothyroidism, unspecified: Secondary | ICD-10-CM

## 2015-12-04 DIAGNOSIS — I493 Ventricular premature depolarization: Secondary | ICD-10-CM

## 2015-12-04 DIAGNOSIS — J449 Chronic obstructive pulmonary disease, unspecified: Secondary | ICD-10-CM | POA: Diagnosis not present

## 2015-12-04 MED ORDER — FLUTICASONE-SALMETEROL 250-50 MCG/DOSE IN AEPB: 1.0000 | INHALATION_SPRAY | Freq: Two times a day (BID) | RESPIRATORY_TRACT | Status: DC

## 2015-12-04 MED ORDER — ALBUTEROL SULFATE HFA 108 (90 BASE) MCG/ACT IN AERS: 1.0000 | INHALATION_SPRAY | RESPIRATORY_TRACT | Status: DC | PRN

## 2015-12-04 MED ORDER — LEVOTHYROXINE SODIUM 75 MCG PO TABS: 75.0000 ug | ORAL_TABLET | Freq: Every day | ORAL | Status: DC

## 2015-12-04 MED ORDER — TIOTROPIUM BROMIDE MONOHYDRATE 18 MCG IN CAPS: 1.0000 | ORAL_CAPSULE | Freq: Every day | RESPIRATORY_TRACT | Status: DC

## 2015-12-04 NOTE — Patient Instructions (Signed)
try Maxitrate 1/2 bottle and then repeat in 6 hours to clean out start today and then take Miralax OTC.
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
## 2015-12-06 ENCOUNTER — Encounter: Payer: PPO | Attending: Internal Medicine | Admitting: Internal Medicine

## 2015-12-06 DIAGNOSIS — Z9109 Other allergy status, other than to drugs and biological substances: Secondary | ICD-10-CM | POA: Diagnosis not present

## 2015-12-06 DIAGNOSIS — Z87891 Personal history of nicotine dependence: Secondary | ICD-10-CM | POA: Diagnosis not present

## 2015-12-06 DIAGNOSIS — I10 Essential (primary) hypertension: Secondary | ICD-10-CM | POA: Insufficient documentation

## 2015-12-06 DIAGNOSIS — J449 Chronic obstructive pulmonary disease, unspecified: Secondary | ICD-10-CM | POA: Diagnosis not present

## 2015-12-06 DIAGNOSIS — I872 Venous insufficiency (chronic) (peripheral): Secondary | ICD-10-CM | POA: Insufficient documentation

## 2015-12-06 DIAGNOSIS — F419 Anxiety disorder, unspecified: Secondary | ICD-10-CM | POA: Diagnosis not present

## 2015-12-06 DIAGNOSIS — L97213 Non-pressure chronic ulcer of right calf with necrosis of muscle: Secondary | ICD-10-CM | POA: Insufficient documentation

## 2015-12-06 DIAGNOSIS — S81811A Laceration without foreign body, right lower leg, initial encounter: Secondary | ICD-10-CM | POA: Diagnosis not present

## 2015-12-06 DIAGNOSIS — Z886 Allergy status to analgesic agent status: Secondary | ICD-10-CM | POA: Insufficient documentation

## 2015-12-06 DIAGNOSIS — Z885 Allergy status to narcotic agent status: Secondary | ICD-10-CM | POA: Diagnosis not present

## 2015-12-06 DIAGNOSIS — M1990 Unspecified osteoarthritis, unspecified site: Secondary | ICD-10-CM | POA: Diagnosis not present

## 2015-12-06 NOTE — Progress Notes (Addendum)
LADONNA, TARR ([id number]) Visit Report for 12/06/2015 Allergy List Details Patient Name: [name] [name], [name] Date of Service: 12/06/2015 8:45 AM Medical Record Patient Account Number: [id number] Number: Treating RN: Baruch Gouty, RN, BSN, Rita 18-Apr-1941 [phone] y.o. Other Clinician: Date of Birth/Sex: Female) Treating ROBSON, MICHAEL Primary Care Physician: Margarita Rana Physician/Extender: G Referring Physician: [name] in Treatment: 0 Allergies Active Allergies codeine diphehydramine morphine sulphate NSAIDS (Non-Steroidal Anti-Inflammatory Drug) tramadol celecoxib rofecoxib Allergy Notes Electronic Signature(s) Signed: 12/06/2015 8:59:45 AM By: Regan Lemming BSN, RN Previous Signature: 12/06/2015 8:34:42 AM Version By: Regan Lemming BSN, RN Previous Signature: 12/06/2015 8:34:24 AM Version By: Regan Lemming BSN, RN Entered By: Regan Lemming on 12/06/2015 08:59:45 [name] [name], [name] -------------------------------------------------------------------------------- Arrival Information Details Patient Name: [name], [name] Date of Service: 12/06/2015 8:45 AM Medical Record Patient Account Number: [id number] Number: Treating RN: Baruch Gouty, RN, BSN, Rita Sep 04, 1940 [phone] y.o. Other Clinician: Date of Birth/Sex: Female) Treating ROBSON, MICHAEL Primary Care Physician: Margarita Rana Physician/Extender: G Referring Physician: [name] in Treatment: 0 Visit Information Patient Arrived: Ambulatory Arrival Time: 08:36 Accompanied By: self Transfer Assistance: None Patient Identification Verified: Yes Secondary Verification Process Yes Completed: Patient Requires Transmission-Based No Precautions: Patient Has Alerts: No Electronic Signature(s) Signed: 12/06/2015 4:32:43 PM By: Regan Lemming BSN, RN Entered By: Regan Lemming on 12/06/2015 08:39:43 [name] [name], [name]  ([id number]) -------------------------------------------------------------------------------- Clinic Level of Care Assessment Details Patient Name: [name], [name] Date of Service: 12/06/2015 8:45 AM Medical Record Patient Account Number: [id number] Number: Treating RN: Baruch Gouty, RN, BSN, Rita [birth year] [phone] y.o. Other Clinician: Date of Birth/Sex: Female) Treating ROBSON, MICHAEL Primary Care Physician: Margarita Rana Physician/Extender: G Referring Physician: [name] in Treatment: 0 Clinic Level of Care Assessment Items TOOL 1 Quantity Score []  - Use when EandM and Procedure is performed on INITIAL visit 0 ASSESSMENTS - Nursing Assessment / Reassessment X - General Physical Exam (combine w/ comprehensive assessment (listed just 1 20 below) when performed on new pt. evals) X - Comprehensive Assessment (HX, ROS, Risk Assessments, Wounds Hx, etc.) 1 25 ASSESSMENTS - Wound and Skin Assessment / Reassessment []  - Dermatologic / Skin Assessment (not related to wound area) 0 ASSESSMENTS - Ostomy and/or Continence Assessment and Care []  - Incontinence Assessment and Management 0 []  - Ostomy Care Assessment and Management (repouching, etc.) 0 PROCESS - Coordination of Care X - Simple Patient / Family Education for ongoing care 1 15 []  - Complex (extensive) Patient / Family Education for ongoing care 0 X - Staff obtains Programmer, systems, Records, Test Results / Process Orders 1 10 []  - Staff telephones HHA, Nursing Homes / Clarify orders / etc 0 []  - Routine Transfer to another Facility (non-emergent condition) 0 []  - Routine Hospital Admission (non-emergent condition) 0 X - New Admissions / Biomedical engineer / Ordering NPWT, Apligraf, etc. 1 15 []  - Emergency Hospital Admission (emergent condition) 0 PROCESS - Special Needs []  - Pediatric / Minor Patient Management 0 [name] [name], [name] []  - Isolation Patient Management 0 []  - Hearing / Language / Visual  special needs 0 []  - Assessment of Community assistance (transportation, D/C planning, etc.) 0 []  - Additional assistance / Altered mentation 0 []  - Support Surface(s) Assessment (bed, cushion, seat, etc.) 0 INTERVENTIONS - Miscellaneous []  - External ear exam 0 []  - Patient Transfer (multiple staff / Civil Service fast streamer / Similar devices) 0 []  - Simple Staple / Suture removal (25 or less) 0 []  - Complex Staple / Suture removal (26 or more)  0 []  - Hypo/Hyperglycemic Management (do not check if billed separately) 0 X - Ankle / Brachial Index (ABI) - do not check if billed separately 1 15 Has the patient been seen at the hospital within the last three years: Yes Total Score: 100 Level Of Care: New/Established - Level 3 Electronic Signature(s) Signed: 12/06/2015 4:32:43 PM By: Regan Lemming BSN, RN Entered By: Regan Lemming on 12/06/2015 09:03:20 [name] [name], [name] -------------------------------------------------------------------------------- Encounter Discharge Information Details Patient Name: [name], [name] Date of Service: 12/06/2015 8:45 AM Medical Record Patient Account Number: [id number] Number: Treating RN: Baruch Gouty, RN, BSN, Rita March 20, 1941 [phone] y.o. Other Clinician: Date of Birth/Sex: Female) Treating ROBSON, MICHAEL Primary Care Physician: Margarita Rana Physician/Extender: G Referring Physician: [name] in Treatment: 0 Encounter Discharge Information Items Discharge Pain Level: 0 Discharge Condition: Stable Ambulatory Status: Ambulatory Discharge Destination: Home Transportation: Private Auto Accompanied By: self Schedule Follow-up Appointment: No Medication Reconciliation completed and provided to Patient/Care No [name] Provided on Clinical Summary of Care: 12/06/2015 Form Type Recipient Paper Patient NB Electronic Signature(s) Signed: 12/06/2015 9:26:16 AM By: Ruthine Dose Entered By: Ruthine Dose on 12/06/2015 09:26:16 [name] [name], [name]  ([id number]) -------------------------------------------------------------------------------- Lower Extremity Assessment Details Patient Name: Calix, Wylee L. Date of Service: 12/06/2015 8:45 AM Medical Record Patient Account Number: [id number] Number: Treating RN: Baruch Gouty, RN, BSN, Rita 05/21/1941 [phone] y.o. Other Clinician: Date of Birth/Sex: Female) Treating ROBSON, MICHAEL Primary Care Physician: Margarita Rana Physician/Extender: G Referring Physician: [name] in Treatment: 0 Edema Assessment Assessed: [Left: No] [Right: No] E[Left: dema] [Right: :] Calf Left: Right: Point of Measurement: 32 cm From Medial Instep 36.5 cm 36 cm Ankle Left: Right: Point of Measurement: 7 cm From Medial Instep 21 cm 21.5 cm Vascular Assessment Pulses: Posterior Tibial Palpable: [Left:Yes] [Right:Yes] Doppler: [Left:Multiphasic] [Right:Multiphasic] Dorsalis Pedis Palpable: [Left:Yes] [Right:Yes] Doppler: [Left:Multiphasic] [Right:Multiphasic] Extremity colors, hair growth, and conditions: Extremity Color: [Left:Normal] [Right:Normal] Hair Growth on Extremity: [Left:No] [Right:No] Temperature of Extremity: [Left:Warm] [Right:Warm] Capillary Refill: [Left:< 3 seconds] [Right:< 3 seconds] Blood Pressure: Brachial: [Left:125] [Right:128] Dorsalis Pedis: 133 [Left:Dorsalis Pedis: 150] Ankle: Posterior Tibial: 148 [Left:Posterior Tibial: 148 1.16] [Right:1.17] Toe Nail Assessment Left: Right: Thick: Yes Yes Discolored: Yes Yes [name] [name], [name] Deformed: No No Improper Length and Hygiene: No No Electronic Signature(s) Signed: 12/06/2015 4:32:43 PM By: Regan Lemming BSN, RN Entered By: Regan Lemming on 12/06/2015 08:52:49 [name] [name], [name] -------------------------------------------------------------------------------- Multi Wound Chart Details Patient Name: Hennington, Emelina L. Date of Service: 12/06/2015 8:45 AM Medical Record Patient Account Number:  [id number] Number: Treating RN: Baruch Gouty, RN, BSN, Rita February 14, 1941 [phone] y.o. Other Clinician: Date of Birth/Sex: Female) Treating ROBSON, MICHAEL Primary Care Physician: Margarita Rana Physician/Extender: G Referring Physician: [name] in Treatment: 0 Vital Signs Height(in): 65 Pulse(bpm): 66 Weight(lbs): 134 Blood Pressure 127/52 (mmHg): Body Mass Index(BMI): 22 Temperature(F): 97.5 Respiratory Rate 17 (breaths/min): Photos: [1:No Photos] [N/A:N/A] Wound Location: [1:Right Lower Leg - Lateral] [N/A:N/A] Wounding Event: [1:Trauma] [N/A:N/A] Primary Etiology: [1:Skin Tear] [N/A:N/A] Date Acquired: [1:10/11/2015] [N/A:N/A] Weeks of Treatment: [1:0] [N/A:N/A] Wound Status: [1:Open] [N/A:N/A] Measurements L x W x D 2x2x0.1 [N/A:N/A] (cm) Area (cm) : [1:3.142] [N/A:N/A] Volume (cm) : [1:0.314] [N/A:N/A] % Reduction in Area: [1:0.00%] [N/A:N/A] % Reduction in Volume: 0.00% [N/A:N/A] Classification: [1:Full Thickness Without Exposed Support Structures] [N/A:N/A] Exudate Amount: [1:Medium] [N/A:N/A] Exudate Type: [1:Serosanguineous] [N/A:N/A] Exudate Color: [1:red, brown] [N/A:N/A] Wound Margin: [1:Distinct, outline attached] [N/A:N/A] Granulation Amount: [1:None Present (0%)] [N/A:N/A] Necrotic Amount: [1:Large (67-100%)] [N/A:N/A] Necrotic Tissue: [1:Eschar, Adherent  Slough] [N/A:N/A] Exposed Structures: [1:Fascia: No Fat: No Tendon: No Muscle: No Joint: No] [N/A:N/A] Bone: No Limited to Skin Breakdown Periwound Skin Texture: Edema: Yes N/A N/A Excoriation: No Induration: No Callus: No Crepitus: No Fluctuance: No Friable: No Rash: No Scarring: No Periwound Skin Moist: Yes N/A N/A Moisture: Maceration: No Dry/Scaly: No Periwound Skin Color: Atrophie Blanche: No N/A N/A Cyanosis: No Ecchymosis: No Erythema: No Hemosiderin Staining: No Mottled: No Pallor: No Rubor: No Temperature: No Abnormality N/A N/A Tenderness on Yes N/A  N/A Palpation: Wound Preparation: Ulcer Cleansing: N/A N/A Rinsed/Irrigated with Saline Topical Anesthetic Applied: Other: lidocaine 4% Treatment Notes Electronic Signature(s) Signed: 12/06/2015 4:32:43 PM By: Regan Lemming BSN, RN Entered By: Regan Lemming on 12/06/2015 09:02:39 [name] [name], [name] -------------------------------------------------------------------------------- Multi-Disciplinary Care Plan Details Patient Name: [name] [name], [name] Date of Service: 12/06/2015 8:45 AM Medical Record Patient Account Number: [id number] Number: Treating RN: Baruch Gouty, RN, BSN, Rita [birth year] [phone] y.o. Other Clinician: Date of Birth/Sex: Female) Treating ROBSON, MICHAEL Primary Care Physician: Margarita Rana Physician/Extender: G Referring Physician: [name] in Treatment: 0 Active Inactive Orientation to the Wound Care Program Nursing Diagnoses: Knowledge deficit related to the wound healing center program Goals: Patient/caregiver will verbalize understanding of the [facility] Program Date Initiated: 12/06/2015 Goal Status: Active Interventions: Provide education on orientation to the wound center Notes: Venous Leg Ulcer Nursing Diagnoses: Knowledge deficit related to disease process and management Potential for venous Insuffiency (use before diagnosis confirmed) Goals: Non-invasive venous studies are completed as ordered Date Initiated: 12/06/2015 Goal Status: Active Patient will maintain optimal edema control Date Initiated: 12/06/2015 Goal Status: Active Patient/caregiver will verbalize understanding of disease process and disease management Date Initiated: 12/06/2015 Goal Status: Active Verify adequate tissue perfusion prior to therapeutic compression application Date Initiated: 12/06/2015 Goal Status: Active [name] [name], [name] Interventions: Assess peripheral edema status every visit. Compression as ordered Provide education on  venous insufficiency Treatment Activities: Non-invasive vascular studies : 12/06/2015 Therapeutic compression applied : 12/06/2015 Notes: Wound/Skin Impairment Nursing Diagnoses: Impaired tissue integrity Knowledge deficit related to smoking impact on wound healing Knowledge deficit related to ulceration/compromised skin integrity Goals: Patient/caregiver will verbalize understanding of skin care regimen Date Initiated: 12/06/2015 Goal Status: Active Ulcer/skin breakdown will have a volume reduction of 30% by week 4 Date Initiated: 12/06/2015 Goal Status: Active Ulcer/skin breakdown will have a volume reduction of 50% by week 8 Date Initiated: 12/06/2015 Goal Status: Active Ulcer/skin breakdown will have a volume reduction of 80% by week 12 Date Initiated: 12/06/2015 Goal Status: Active Ulcer/skin breakdown will heal within 14 weeks Date Initiated: 12/06/2015 Goal Status: Active Interventions: Assess patient/caregiver ability to perform ulcer/skin care regimen upon admission and as needed Assess ulceration(s) every visit Provide education on ulcer and skin care Treatment Activities: Referred to DME Emanuel Campos for dressing supplies : 12/06/2015 Skin care regimen initiated : 12/06/2015 Topical wound management initiated : 12/06/2015 [name] [name], [name] Notes: Electronic Signature(s) Signed: 12/06/2015 4:32:43 PM By: Regan Lemming BSN, RN Entered By: Regan Lemming on 12/06/2015 09:01:59 [name] [name], [name] -------------------------------------------------------------------------------- Pain Assessment Details Patient Name: [name] [name], [name] Date of Service: 12/06/2015 8:45 AM Medical Record Patient Account Number: [id number] Number: Treating RN: Baruch Gouty, RN, BSN, Rita 09/25/1940 [phone] y.o. Other Clinician: Date of Birth/Sex: Female) Treating ROBSON, MICHAEL Primary Care Physician: Margarita Rana Physician/Extender: G Referring Physician: [name] in  Treatment: 0 Active Problems Location of Pain Severity and Description of Pain Patient Has Paino Yes Site Locations Pain Location: Pain in  Ulcers With Dressing Change: No Rate the pain. Current Pain Level: 3 Pain Management and Medication Current Pain Management: How does your pain impact your activities of daily livingo Sleep: Yes Bathing: Yes Appetite: Yes Relationship With Others: Yes Bladder Continence: Yes Emotions: Yes Bowel Continence: Yes Work: Yes Toileting: Yes Drive: Yes Dressing: Yes Hobbies: Astronomer) Signed: 12/06/2015 4:32:43 PM By: Regan Lemming BSN, RN Entered By: Regan Lemming on 12/06/2015 08:40:01 Scrima, [name] [name] ([id number]) -------------------------------------------------------------------------------- Patient/Caregiver Education Details Patient Name: Szatkowski, Briggette L. Date of Service: 12/06/2015 8:45 AM Medical Record Patient Account Number: [id number] Number: Treating RN: Baruch Gouty, RN, BSN, Rita [birth year] [phone] y.o. Other Clinician: Date of Birth/Gender: Female) Treating ROBSON, Lyman Primary Care Physician: Margarita Rana Physician/Extender: G Referring Physician: [name] in Treatment: 0 Education Assessment Education Provided To: Patient Education Topics Provided Basic Hygiene: Methods: Explain/Verbal Responses: State content correctly Venous: Methods: Explain/Verbal Responses: State content correctly Welcome To [facility]: Methods: Explain/Verbal Responses: State content correctly Wound/Skin Impairment: Methods: Explain/Verbal Responses: State content correctly Electronic Signature(s) Signed: 12/06/2015 4:32:43 PM By: Regan Lemming BSN, RN Entered By: Regan Lemming on 12/06/2015 09:04:47 Cousins, Shauntell L. ([id number]) -------------------------------------------------------------------------------- Wound Assessment Details Patient Name: Veno, Nabeeha L. Date of Service: 12/06/2015 8:45  AM Medical Record Patient Account Number: [id number] Number: Treating RN: Baruch Gouty, RN, BSN, Rita [birth year] [phone] y.o. Other Clinician: Date of Birth/Sex: Female) Treating ROBSON, MICHAEL Primary Care Physician: Margarita Rana Physician/Extender: G Referring Physician: [name] in Treatment: 0 Wound Status Wound Number: 1 Primary Skin Tear Etiology: Wound Location: Right Lower Leg - Lateral Wound Open Wounding Event: Trauma Status: Date Acquired: 10/11/2015 Comorbid Cataracts, Chronic Obstructive Weeks Of Treatment: 0 History: Pulmonary Disease (COPD), Clustered Wound: No Hypertension, Osteoarthritis, Neuropathy, Confinement Anxiety Photos Photo Uploaded By: Regan Lemming on 12/06/2015 17:14:35 Wound Measurements Length: (cm) 2 Width: (cm) 2 Depth: (cm) 0.1 Area: (cm) 3.142 Volume: (cm) 0.314 % Reduction in Area: 0% % Reduction in Volume: 0% Tunneling: Yes Position (o'clock): 3 Maximum Distance: (cm) 1 Undermining: No Heims, Simya L. ([id number]) Wound Description Full Thickness Without Exposed Classification: Support Structures Wound Margin: Distinct, outline attached Exudate Medium Amount: Exudate Type: Serosanguineous Exudate Color: red, brown Foul Odor After Cleansing: No Wound Bed Granulation Amount: None Present (0%) Exposed Structure Necrotic Amount: Large (67-100%) Fascia Exposed: No Necrotic Quality: Eschar, Adherent Slough Fat Layer Exposed: No Tendon Exposed: No Muscle Exposed: No Joint Exposed: No Bone Exposed: No Limited to Skin Breakdown Periwound Skin Texture Texture Color No Abnormalities Noted: No No Abnormalities Noted: No Callus: No Atrophie Blanche: No Crepitus: No Cyanosis: No Excoriation: No Ecchymosis: No Fluctuance: No Erythema: No Friable: No Hemosiderin Staining: No Induration: No Mottled: No Localized Edema: Yes Pallor: No Rash: No Rubor: No Scarring: No Temperature / Pain Moisture  Temperature: No Abnormality No Abnormalities Noted: No Tenderness on Palpation: Yes Dry / Scaly: No Maceration: No Moist: Yes Wound Preparation Ulcer Cleansing: Rinsed/Irrigated with Saline Topical Anesthetic Applied: Other: lidocaine 4%, Treatment Notes Wound #1 (Right, Lateral Lower Leg) 1. Cleansed with: Clean wound with Normal Saline 3. Peri-wound Care: Bialas, Marysol L. ([id number]) Skin Prep 4. Dressing Applied: Prisma Ag 5. Secondary Dressing Applied Dry Gauze 7. Secured with 3 Layer Compression System - Right Lower Extremity Electronic Signature(s) Signed: 12/06/2015 4:32:43 PM By: Regan Lemming BSN, RN Entered By: Regan Lemming on 12/06/2015 09:12:45 Wortley, Tifany L. ([id number]) -------------------------------------------------------------------------------- Vitals Details Patient Name: Shreeve, Tony L. Date of Service: 12/06/2015 8:45 AM Medical Record Patient Account Number: [id number]  [id number] Number: Treating RN: Baruch Gouty, RN, BSN, Rita [birth year] [phone] y.o. Other Clinician: Date of Birth/Sex: Female) Treating ROBSON, Kirkman Primary Care Physician: Margarita Rana Physician/Extender: G Referring Physician: [name] in Treatment: 0 Vital Signs Time Taken: 08:40 Temperature (F): 97.5 Height (in): 65 Pulse (bpm): 66 Source: Stated Respiratory Rate (breaths/min): 17 Weight (lbs): 134 Blood Pressure (mmHg): 127/52 Source: Stated Reference Range: 80 - 120 mg / dl Body Mass Index (BMI): 22.3 Electronic Signature(s) Signed: 12/06/2015 4:32:43 PM By: Regan Lemming BSN, RN Entered By: Regan Lemming on 12/06/2015 08:40:44

## 2015-12-07 ENCOUNTER — Ambulatory Visit
Admission: RE | Admit: 2015-12-07 | Discharge: 2015-12-07 | Disposition: A | Discharge status: Home / Self Care | Payer: PPO | Source: Ambulatory Visit | Attending: Cardiothoracic Surgery | Admitting: Cardiothoracic Surgery

## 2015-12-07 ENCOUNTER — Inpatient Hospital Stay: Payer: PPO | Attending: Cardiothoracic Surgery | Admitting: Cardiothoracic Surgery

## 2015-12-07 VITALS — BP 119/72 | HR 62 | Temp 96.9°F | Wt 133.4 lb

## 2015-12-07 DIAGNOSIS — J439 Emphysema, unspecified: Secondary | ICD-10-CM | POA: Insufficient documentation

## 2015-12-07 DIAGNOSIS — R911 Solitary pulmonary nodule: Secondary | ICD-10-CM | POA: Insufficient documentation

## 2015-12-07 DIAGNOSIS — R918 Other nonspecific abnormal finding of lung field: Secondary | ICD-10-CM | POA: Diagnosis not present

## 2015-12-07 DIAGNOSIS — I2510 Atherosclerotic heart disease of native coronary artery without angina pectoris: Secondary | ICD-10-CM | POA: Diagnosis not present

## 2015-12-07 DIAGNOSIS — Z9049 Acquired absence of other specified parts of digestive tract: Secondary | ICD-10-CM | POA: Diagnosis not present

## 2015-12-07 NOTE — Progress Notes (Signed)
[name] Post-Op Note  Patient ID: [name] [name], female   DOB: 1940-09-08, 75 y.o.   MRN: [id number]  HISTORY: This patient returns today in follow-up. She's had no new problems except for her right lower extremity which she suffered an injury to and is currently being managed on her wound care center. She states she's had no fevers or chills. She has no shortness of breath.   Filed Vitals:   12/07/15 1157  BP: 119/72  Pulse: 62  Temp: 96.9 F (36.1 C)     EXAM: Resp: Lungs are clear bilaterally.  No respiratory distress, normal effort. Heart:  Regular without murmurs Abd:  Abdomen is soft, non distended and non tender. No masses are palpable.  There is no rebound and no guarding.  Neurological: Alert and oriented to person, place, and time. Coordination normal.  Skin: Skin is warm and dry. No rash noted. No diaphoretic. No erythema. No pallor.  Psychiatric: Normal mood and affect. Normal behavior. Judgment and thought content normal.    ASSESSMENT: She did have a follow-up CT scan which have independently reviewed. The nodules in question appear smaller or completely resolved. I see no new significant findings. 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   PLAN:   I asked the patient to call me tomorrow so that I can review with her the official radiology results. She will do so. Additional follow-up will be made at that time.    Nestor Lewandowsky, MD

## 2015-12-07 NOTE — Progress Notes (Signed)
[name] [name], [name] Visit Report for 12/06/2015 Chief Complaint Document Details Patient Name: [name] [name], [name] Date of Service: 12/06/2015 8:45 AM Medical Record Patient Account Number: [id number] Number: Treating RN: Baruch Gouty, RN, BSN, Rita February 07, 1941 [phone] y.o. Other Clinician: Date of Birth/Sex: Female) Treating ROBSON, MICHAEL Primary Care Physician/Extender: Lonn Georgia Physician: Referring Physician: [name] in Treatment: 0 Information Obtained from: Patient Chief Complaint Patient is here for a chronic wound on her right anterior lateral leg Electronic Signature(s) Signed: 12/06/2015 4:27:19 PM By: Linton Ham MD Entered By: Linton Ham on 12/06/2015 10:04:08 [name] [name], [name] -------------------------------------------------------------------------------- Debridement Details Patient Name: [name], [name] Date of Service: 12/06/2015 8:45 AM Medical Record Patient Account Number: [id number] Number: Treating RN: Baruch Gouty, RN, BSN, Rita 03/30/1941 [phone] y.o. Other Clinician: Date of Birth/Sex: Female) Treating ROBSON, MICHAEL Primary Care Physician/Extender: Lonn Georgia Physician: Referring Physician: [name] in Treatment: 0 Debridement Performed for Wound #1 Right,Lateral Lower Leg Assessment: Performed By: Physician Ricard Dillon, MD Debridement: Debridement Pre-procedure Yes Verification/Time Out Taken: Start Time: 09:02 Pain Control: Lidocaine 4% Topical Solution Level: Skin/Subcutaneous Tissue Total Area Debrided (L x 2 (cm) x 2 (cm) = 4 (cm) W): Tissue and other Non-Viable, Eschar, Exudate, Fibrin/Slough, Subcutaneous material debrided: Instrument: Curette Bleeding: Minimum Hemostasis Achieved: Pressure End Time: 09:08 Procedural Pain: 0 Post Procedural Pain: 0 Response to Treatment: Procedure was tolerated well Post Debridement Measurements of Total Wound Length: (cm)  2 Width: (cm) 2 Depth: (cm) 0.1 Volume: (cm) 0.314 Post Procedure Diagnosis Same as Pre-procedure Electronic Signature(s) Signed: 12/06/2015 4:27:19 PM By: Linton Ham MD Signed: 12/06/2015 4:32:43 PM By: Regan Lemming BSN, RN Entered By: Linton Ham on 12/06/2015 10:03:47 [name] [name], [name] [name], [name] ([id number]) -------------------------------------------------------------------------------- HPI Details Patient Name: [name], [name] Date of Service: 12/06/2015 8:45 AM Medical Record Patient Account Number: [id number] Number: Treating RN: Baruch Gouty, RN, BSN, Rita Mar 16, 1941 [phone] y.o. Other Clinician: Date of Birth/Sex: Female) Treating ROBSON, MICHAEL Primary Care Physician/Extender: Lonn Georgia Physician: Referring Physician: [name] in Treatment: 0 History of Present Illness HPI Description: 12/06/15; this is a patient who has no prior wound history and is not a diabetic. In February she was getting into her family truck and the wind pushed the door against the outer aspect of her right lower leg. I believe there was initially some swelling but not clearly a hematoma. 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 Has been using topical antibiotics on this. 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 There is already been some improvement. She has no prior history of PAD and no major history of venous insufficiency. Electronic Signature(s) Signed: 12/06/2015 4:27:19 PM By: Linton Ham MD Entered By: Linton Ham on 12/06/2015 10:05:36 [name] [name], [name] -------------------------------------------------------------------------------- Physical Exam Details Patient Name: [name], [name] Date of Service: 12/06/2015 8:45 AM Medical Record Patient Account Number: [id number] Number: Treating RN: Baruch Gouty, RN, BSN, Rita 10-02-1940 [phone] y.o. Other Clinician: Date of Birth/Sex: Female)  Treating ROBSON, MICHAEL Primary Care Physician/Extender: Lonn Georgia Physician: Referring Physician: [name] in Treatment: 0 Constitutional Sitting or standing Blood Pressure is within target range for patient.. Pulse regular and within target range for patient.Marland Kitchen Respirations regular, non-labored and within target range.. Temperature is normal and within the target range for the patient.. Patient's appearance is neat and clean. Appears in no acute distress. Well nourished and well developed.. Cardiovascular Femoral arteries without bruits  and pulses strong.. Pedal pulses palpable and strong bilaterally.. Probably some degree of stasis physiology but this is mild. There is some edema around the wound.. Gastrointestinal (GI) Abdomen is soft and non-distended without masses or tenderness. Bowel sounds active in all quadrants.. No liver or spleen enlargement or tenderness.. Lymphatic Nonpalpable with the popliteal or inguinal area. Musculoskeletal No obvious involvement of the ankle joint there is no swelling. Neurological Patient reports absence of sensation around the wound area and distal to it suggesting some degree of superficial nerve damage. Psychiatric No evidence of depression, anxiety, or agitation. Calm, cooperative, and communicative. Appropriate interactions and affect.. Notes Wound exam; the areas on the right anterior lateral lower leg give. This is covered in a gelatinous surface slough which was debridement. Nonviable subcutaneous tissue also removed. Post debridement the base of this wound looks reasonably healthy although there is some mild tunneling medially. I do not feel there was any evidence of infection here, no culture was felt to be necessary. Electronic Signature(s) Signed: 12/06/2015 4:27:19 PM By: Linton Ham MD Entered By: Linton Ham on 12/06/2015 10:07:53 [name] [name], [name] [name]  ([id number]) -------------------------------------------------------------------------------- Physician Orders Details Patient Name: Maynez, Wilmoth L. Date of Service: 12/06/2015 8:45 AM Medical Record Patient Account Number: [id number] Number: Treating RN: Baruch Gouty, RN, BSN, Rita 04/16/1941 [phone] y.o. Other Clinician: Date of Birth/Sex: Female) Treating ROBSON, MICHAEL Primary Care Physician/Extender: Lonn Georgia Physician: Referring Physician: [name] in Treatment: 0 Verbal / Phone Orders: Yes Clinician: Afful, RN, BSN, Rita Read Back and Verified: Yes Diagnosis Coding Wound Cleansing Wound #1 Right,Lateral Lower Leg o Cleanse wound with mild soap and water o May Shower, gently pat wound dry prior to applying new dressing. o May shower with protection. Anesthetic Wound #1 Right,Lateral Lower Leg o Topical Lidocaine 4% cream applied to wound bed prior to debridement - Used only in the clinic Skin Barriers/Peri-Wound Care Wound #1 Right,Lateral Lower Leg o Skin Prep Primary Wound Dressing Wound #1 Right,Lateral Lower Leg o Prisma Ag Secondary Dressing Wound #1 Right,Lateral Lower Leg o Dry Gauze Dressing Change Frequency Wound #1 Right,Lateral Lower Leg o Change dressing every other day. Follow-up Appointments Wound #1 Right,Lateral Lower Leg o Return Appointment in 1 week. Edema Control Wound #1 Right,Lateral Lower Leg [name] [name], [name] o 3 Layer Compression System - Right Lower Extremity Additional Orders / Instructions Wound #1 Right,Lateral Lower Leg o Increase protein intake. o Activity as tolerated Electronic Signature(s) Signed: 12/06/2015 4:27:19 PM By: Linton Ham MD Signed: 12/06/2015 4:32:43 PM By: Regan Lemming BSN, RN Entered By: Regan Lemming on 12/06/2015 09:12:19 [name] [name], [name] -------------------------------------------------------------------------------- Problem List  Details Patient Name: Soules, Sasha L. Date of Service: 12/06/2015 8:45 AM Medical Record Patient Account Number: [id number] Number: Treating RN: Baruch Gouty, RN, BSN, Rita [birth year] [phone] y.o. 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 -------------------------------------------------------------------------------- Progress Note Details Patient Name: [name] [name], [name] Date of Service: 12/06/2015 8:45 AM Medical Record Patient Account Number: [id number] Number: Treating RN: Baruch Gouty, RN, BSN, Rita Oct 19, 1940 [phone] y.o. Other Clinician: Date of Birth/Sex: Female) Treating ROBSON, MICHAEL  Primary Care Physician/Extender: Landis Gandy, Izora Gala Physician: Referring Physician: [name] in Treatment: 0 Subjective Chief Complaint Information obtained from Patient Patient is here for a chronic wound on her right anterior lateral leg History of Present Illness (HPI) 12/06/15; this is a patient who has no prior wound history and is not a diabetic. In February she was getting into her family truck and the wind pushed the door against the outer aspect of her right lower leg. I believe there was initially some swelling but not clearly a hematoma. 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 Has been using topical antibiotics on this. 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 There is  already been some improvement. She has no prior history of PAD and no major history of venous insufficiency. Wound History Patient presents with 1 open wound that has been present for approximately 30mos. Patient has been treating wound in the following manner: neosporin. Laboratory tests have not been performed in the last month. Patient reportedly has not tested positive for an antibiotic resistant organism. Patient reportedly has not tested positive for osteomyelitis. Patient reportedly has not had testing performed to evaluate circulation in the legs. Patient experiences the following problems associated with their wounds: infection, swelling. Patient History Information obtained from Patient, Chart. Allergies codeine, diphehydramine, morphine sulphate, NSAIDS (Non-Steroidal Anti-Inflammatory Drug), tramadol, celecoxib, rofecoxib Family History Cancer - Siblings, Diabetes - Mother, Heart Disease - Father, Hypertension - Mother, Father, Lung Disease - Siblings, Stroke - Paternal Grandparents, Maternal Grandparents, Thyroid Problems - Mother, No family history of Hereditary Spherocytosis, Kidney Disease, Seizures, Tuberculosis. Social History Former smoker, Marital Status - Married. [place], TOULA [place] ([id number]) Medical History Eyes Patient has history of Cataracts - reomved Ear/Nose/Mouth/Throat Denies history of Chronic sinus problems/congestion, Middle ear problems Hematologic/Lymphatic Denies history of Anemia, Hemophilia, Human Immunodeficiency Virus, Lymphedema, Sickle Cell Disease Respiratory Patient has history of Chronic Obstructive Pulmonary Disease (COPD) Denies history of Aspiration, Asthma, Pneumothorax, Sleep Apnea, Tuberculosis Cardiovascular Patient has history of Hypertension Denies history of Angina, Arrhythmia Endocrine Denies history of Type I Diabetes, Type II Diabetes Immunological Denies history of Lupus Erythematosus, Raynaud s, Scleroderma Integumentary  (Skin) Denies history of History of Burn, History of pressure wounds Musculoskeletal Patient has history of Osteoarthritis Neurologic Patient has history of Neuropathy Denies history of Quadriplegia, Paraplegia, Seizure Disorder Psychiatric Patient has history of Confinement Anxiety Denies history of Anorexia/bulimia Medical And Surgical History Notes Cardiovascular Benign PVCS Gastrointestinal Constipation Review of Systems (ROS) Constitutional Symptoms (General Health) The patient has no complaints or symptoms. Eyes Complains or has symptoms of Glasses / Contacts. Denies complaints or symptoms of Vision Changes. Ear/Nose/Mouth/Throat The patient has no complaints or symptoms. Hematologic/Lymphatic The patient has no complaints or symptoms. Respiratory The patient has no complaints or symptoms. Cardiovascular The patient has no complaints or symptoms. Gastrointestinal The patient has no complaints or symptoms. [name] [name], [name] Endocrine The patient has no complaints or symptoms. Genitourinary The patient has no complaints or symptoms. Immunological The patient has no complaints or symptoms. Integumentary (Skin) Complains or has symptoms of Wounds, Breakdown, Swelling. Musculoskeletal The patient has no complaints or symptoms. Neurologic The patient has no complaints or symptoms. Oncologic The patient has no complaints or symptoms. Psychiatric Complains or has symptoms of Claustrophobia. Objective Constitutional Sitting or standing Blood Pressure is within target range for patient.. Pulse regular and within target range for patient.Marland Kitchen Respirations regular, non-labored and within target range.. Temperature is normal and within the  target range for the patient.. Patient's appearance is neat and clean. Appears in no acute distress. Well nourished and well developed.. Vitals Time Taken: 8:40 AM, Height: 65 in, Source: Stated, Weight: 134 lbs, Source:  Stated, BMI: 22.3, Temperature: 97.5 F, Pulse: 66 bpm, Respiratory Rate: 17 breaths/min, Blood Pressure: 127/52 mmHg. Cardiovascular Femoral arteries without bruits and pulses strong.. Pedal pulses palpable and strong bilaterally.. Probably some degree of stasis physiology but this is mild. There is some edema around the wound.. Gastrointestinal (GI) Abdomen is soft and non-distended without masses or tenderness. Bowel sounds active in all quadrants.. No liver or spleen enlargement or tenderness.. Lymphatic Nonpalpable with the popliteal or inguinal area. Musculoskeletal No obvious involvement of the ankle joint there is no swelling. Neurological Patient reports absence of sensation around the wound area and distal to it suggesting some degree of [name] [name], [name] ([id number]) superficial nerve damage. Psychiatric No evidence of depression, anxiety, or agitation. Calm, cooperative, and communicative. Appropriate interactions and affect.. General Notes: Wound exam; the areas on the right anterior lateral lower leg give. This is covered in a gelatinous surface slough which was debridement. Nonviable subcutaneous tissue also removed. Post debridement the base of this wound looks reasonably healthy although there is some mild tunneling medially. I do not feel there was any evidence of infection here, no culture was felt to be necessary. Integumentary (Hair, Skin) Wound #1 status is Open. Original cause of wound was Trauma. The wound is located on the Right,Lateral Lower Leg. The wound measures 2cm length x 2cm width x 0.1cm depth; 3.142cm^2 area and 0.314cm^3 volume. The wound is limited to skin breakdown. There is no undermining noted, however, there is tunneling at 3:00 with a maximum distance of 1cm. There is a medium amount of serosanguineous drainage noted. The wound margin is distinct with the outline attached to the wound base. There is no granulation within the wound bed. There is a  large (67-100%) amount of necrotic tissue within the wound bed including Eschar and Adherent Slough. The periwound skin appearance exhibited: Localized Edema, Moist. The periwound skin appearance did not exhibit: Callus, Crepitus, Excoriation, Fluctuance, Friable, Induration, Rash, Scarring, Dry/Scaly, Maceration, Atrophie Blanche, Cyanosis, Ecchymosis, Hemosiderin Staining, Mottled, Pallor, Rubor, Erythema. Periwound temperature was noted as No Abnormality. The periwound has tenderness on palpation. 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 There was a Skin/Subcutaneous Tissue Debridement [id number]) debridement with total area of 4 sq cm performed by Ricard Dillon, MD. with the following instrument(s): Curette to remove Non-Viable tissue/material including Exudate, Fibrin/Slough, Eschar, and Subcutaneous after achieving pain control using Lidocaine 4% Topical Solution. A time out was conducted prior to the start of the procedure. A Minimum amount of bleeding was controlled with Pressure. The procedure was tolerated well with a pain level of 0 throughout and a pain level of 0 [name] [name], Natsumi L. ([id number]) following the procedure. Post Debridement Measurements: 2cm length x 2cm width x 0.1cm depth; 0.314cm^3 volume. Post procedure Diagnosis Wound #1: Same as Pre-Procedure Plan Wound Cleansing: Wound #1 Right,Lateral Lower Leg: Cleanse wound with mild soap and water May Shower, gently pat wound dry prior to applying new dressing. May shower with protection. Anesthetic: Wound #1 Right,Lateral Lower Leg: Topical Lidocaine 4% cream applied to wound bed prior to debridement - Used only in the clinic Skin Barriers/Peri-Wound Care:  Wound #1 Right,Lateral Lower Leg: Skin Prep Primary  Wound Dressing: Wound #1 Right,Lateral Lower Leg: Prisma Ag Secondary Dressing: Wound #1 Right,Lateral Lower Leg: Dry Gauze Dressing Change Frequency: Wound #1 Right,Lateral Lower Leg: Change dressing every other day. Follow-up Appointments: Wound #1 Right,Lateral Lower Leg: Return Appointment in 1 week. Edema Control: Wound #1 Right,Lateral Lower Leg: 3 Layer Compression System - Right Lower Extremity Additional Orders / Instructions: Wound #1 Right,Lateral Lower Leg: Increase protein intake. Activity as tolerated #1 after careful consideration we elected to address this wound with Prisma and a Profore light compression. I gave the patient the option of using Prisma under a border foam dressing however I think Schweiss, Lakenya L. ([id number]) the former is likely to add the compression around the wound that will control the swelling. #2 we will see what the surface of the wound looks like next week if this requires further debridement and Santyl may be indicated. I was hoping to avoid that. Electronic Signature(s) Signed: 12/06/2015 4:27:19 PM By: Linton Ham MD Entered By: Linton Ham on 12/06/2015 10:09:11 [place], [name] -------------------------------------------------------------------------------- ROS/PFSH Details Patient Name: Mcconico, Angeleigh L. Date of Service: 12/06/2015 8:45 AM Medical Record Patient Account Number: [id number] Number: Treating RN: Baruch Gouty, RN, BSN, Rita 1941-08-03 [phone] y.o. Other Clinician: Date of Birth/Sex: Female) Treating ROBSON, MICHAEL Primary Care Physician/Extender: Landis Gandy, Izora Gala Physician: Referring Physician: [name] in Treatment: 0 Information Obtained From Patient Chart Wound History Do you currently have one or more open woundso Yes How many open wounds do you currently haveo 1 Approximately how long have you had your woundso 75mos How have you been treating your wound(s) until nowo  neosporin Has your wound(s) ever healed and then re-openedo No Have you had any lab work done in the past montho No Have you tested positive for an antibiotic resistant organism (MRSA, VRE)o No Have you tested positive for osteomyelitis (bone infection)o No Have you had any tests for circulation on your legso No Have you had other problems associated with your woundso Infection, Swelling Eyes Complaints and Symptoms: Positive for: Glasses / Contacts Negative for: Vision Changes Medical History: Positive for: Cataracts - reomved bilateral Ear/Nose/Mouth/Throat Complaints and Symptoms: Positive for: Sinusitis Medical History: Negative for: Chronic sinus problems/congestion; Middle ear problems Past Medical History Notes: Allergic rhinitis Integumentary (Skin) Complaints and Symptoms: Positive for: Wounds; Breakdown; Swelling Medical History: Corbit, Ashlon L. ([id number]) Negative for: History of Burn; History of pressure wounds Psychiatric Complaints and Symptoms: Positive for: Claustrophobia Medical History: Positive for: Confinement Anxiety Negative for: Anorexia/bulimia Constitutional Symptoms (General Health) Complaints and Symptoms: No Complaints or Symptoms Hematologic/Lymphatic Complaints and Symptoms: No Complaints or Symptoms Medical History: Negative for: Anemia; Hemophilia; Human Immunodeficiency Virus; Lymphedema; Sickle Cell Disease Respiratory Complaints and Symptoms: No Complaints or Symptoms Medical History: Positive for: Chronic Obstructive Pulmonary Disease (COPD) Negative for: Aspiration; Asthma; Pneumothorax; Sleep Apnea; Tuberculosis Cardiovascular Complaints and Symptoms: No Complaints or Symptoms Medical History: Positive for: Hypertension Negative for: Angina; Arrhythmia Past Medical History Notes: Benign PVCS, hypercholesteremia Gastrointestinal Complaints and Symptoms: No Complaints or Symptoms Medical History: Past Medical History  Notes: Hemmelgarn, Martasia L. ([id number]) Constipation, hemorrhoids, diverticulitis Endocrine Complaints and Symptoms: No Complaints or Symptoms Medical History: Negative for: Type I Diabetes; Type II Diabetes Genitourinary Complaints and Symptoms: No Complaints or Symptoms Medical History: Past Medical History Notes: cevical pain Immunological Complaints and Symptoms: No Complaints or Symptoms Medical History: Negative for: Lupus Erythematosus; Raynaudos; Scleroderma Musculoskeletal Complaints and Symptoms: No Complaints or Symptoms Medical History:  Positive for: Osteoarthritis Neurologic Complaints and Symptoms: No Complaints or Symptoms Medical History: Positive for: Neuropathy Negative for: Quadriplegia; Paraplegia; Seizure Disorder Oncologic Complaints and Symptoms: No Complaints or Symptoms HBO Extended History Items Allshouse, Karis L. ([id number]) Eyes: Cataracts Family and Social History Cancer: Yes - Siblings; Diabetes: Yes - Mother; Heart Disease: Yes - Father; Hereditary Spherocytosis: No; Hypertension: Yes - Mother, Father; Kidney Disease: No; Lung Disease: Yes - Siblings; Seizures: No; Stroke: Yes - Paternal Grandparents, Maternal Grandparents; Thyroid Problems: Yes - Mother; Tuberculosis: No; Former smoker; Marital Status - Married; Financial Concerns: No; Food, Clothing or Shelter Needs: No; Support System Lacking: No; Transportation Concerns: No; Advanced Directives: Yes (Not Provided); Patient does not want information on Advanced Directives; Living Will: Yes (Not Provided); Medical Power of Attorney: Yes - Ivy Lynn..Husband (Copy provided) Electronic Signature(s) Signed: 12/06/2015 1:23:50 PM By: Regan Lemming BSN, RN Signed: 12/06/2015 4:27:19 PM By: Linton Ham MD Entered By: Regan Lemming on 12/06/2015 13:23:48 Adger, Azaya L. ([id number]) -------------------------------------------------------------------------------- SuperBill Details Patient Name:  Mccaskey, Yameli L. Date of Service: 12/06/2015 Medical Record Patient Account Number: [id number] Number: Treating RN: Baruch Gouty, RN, BSN, Rita November 18, 1940 [phone] y.o. 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.2 Venous insufficiency (chronic) (peripheral) Facility Procedures CPT4 Code Description: [id number] - WOUND CARE VISIT-LEV 3 EST PT Modifier: Quantity: 1 CPT4 Code Description: [id number] - DEB SUBQ TISSUE 20 SQ CM/< ICD-10 Description Diagnosis L97.213 Non-pressure chronic ulcer of right calf with necro Modifier: sis of muscl Quantity: 1 e Physician Procedures CPT4 Code Description: [id number] WC PHYS LEVEL 3 o NEW PT ICD-10 Description 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

## 2015-12-07 NOTE — Progress Notes (Signed)
VERDELLE, PIPP ([id number]) Visit Report for 12/06/2015 Abuse/Suicide Risk Screen Details Patient Name: [name] [name], [name] Date of Service: 12/06/2015 8:45 AM Medical Record Patient Account Number: [id number] Number: Treating RN: Baruch Gouty, RN, BSN, Rita [birth year] [phone] y.o. Other Clinician: Date of Birth/Sex: Female) Treating ROBSON, MICHAEL Primary Care Physician/Extender: Landis Gandy, Izora Gala Physician: Referring Physician: [name] in Treatment: 0 Abuse/Suicide Risk Screen Items Answer ABUSE/SUICIDE RISK SCREEN: Has anyone close to you tried to hurt or harm you recentlyo No Do you feel uncomfortable with anyone in your familyo No Has anyone forced you do things that you didnot want to doo No Do you have any thoughts of harming yourselfo No Patient displays signs or symptoms of abuse and/or neglect. No Electronic Signature(s) Signed: 12/06/2015 4:32:43 PM By: Regan Lemming BSN, RN Entered By: Regan Lemming on 12/06/2015 08:40:53 Fulp, Carolle L. ([id number]) -------------------------------------------------------------------------------- Activities of Daily Living Details Patient Name: [name], [name] Date of Service: 12/06/2015 8:45 AM Medical Record Patient Account Number: [id number] Number: Treating RN: Baruch Gouty, RN, BSN, Rita 23-Mar-1941 [phone] y.o. Other Clinician: Date of Birth/Sex: Female) Treating ROBSON, MICHAEL Primary Care Physician/Extender: Lonn [place] Physician: Referring Physician: [name] in Treatment: 0 Activities of Daily Living Items Answer Activities of Daily Living (Please select one for each item) Drive Automobile Completely Able Take Medications Completely Able Use Telephone Completely Able Care for Appearance Completely Able Use Toilet Completely Able Bath / Shower Completely Able Dress Self Completely Able Feed Self Completely Able Walk Completely Able Get In / Out Bed Completely Able Housework Completely  Able Prepare Meals Completely [place] for Self Completely Able Electronic Signature(s) Signed: 12/06/2015 4:32:43 PM By: Regan Lemming BSN, RN Entered By: Regan Lemming on 12/06/2015 08:41:10 [place] ([id number]) -------------------------------------------------------------------------------- Education Assessment Details Patient Name: [name], [name] Date of Service: 12/06/2015 8:45 AM Medical Record Patient Account Number: [id number] Number: Treating RN: Baruch Gouty, RN, BSN, Rita [birth year] [phone] y.o. Other Clinician: Date of Birth/Sex: Female) Treating ROBSON, MICHAEL Primary Care Physician/Extender: Lonn [place] Physician: Referring Physician: [name] in Treatment: 0 Primary Learner Assessed: Patient Learning Preferences/Education Level/Primary Language Learning Preference: Explanation Highest Education Level: College or Above Preferred Language: English Cognitive Barrier Assessment/Beliefs Language Barrier: No Physical Barrier Assessment Impaired Vision: Yes Glasses Impaired Hearing: No Decreased Hand dexterity: No Knowledge/Comprehension Assessment Knowledge Level: High Comprehension Level: High Ability to understand written High instructions: Ability to understand verbal High instructions: Motivation Assessment Anxiety Level: Calm Cooperation: Cooperative Education Importance: Acknowledges Need Interest in Health Problems: Asks Questions Perception: Coherent Willingness to Engage in Self- High Management Activities: Readiness to Engage in Self- High Management Activities: Electronic Signature(s) Signed: 12/06/2015 4:32:43 PM By: Regan Lemming BSN, RN Moulin, Jubilee L. ([id number]) Entered By: Regan Lemming on 12/06/2015 08:41:34 [place], [name] ([id number]) -------------------------------------------------------------------------------- Fall Risk Assessment Details Patient Name: [name], [name] Date of  Service: 12/06/2015 8:45 AM Medical Record Patient Account Number: [id number] Number: Treating RN: Baruch Gouty, RN, BSN, Rita 1941/07/30 [phone] y.o. Other Clinician: Date of Birth/Sex: Female) Treating ROBSON, MICHAEL Primary Care Physician/Extender: Lonn [place] Physician: Referring Physician: [name] in Treatment: 0 Fall Risk Assessment Items Have you had 2 or more falls in the last 12 monthso 0 No Have you had any fall that resulted in injury in the last 12 monthso 0 No FALL RISK ASSESSMENT: History of falling - immediate or within 3 months 0 No Secondary diagnosis 0 No Ambulatory aid None/bed rest/wheelchair/nurse 0 Yes Crutches/cane/walker  0 No Furniture 0 No IV Access/Saline Lock 0 No Gait/Training Normal/bed rest/immobile 0 Yes Weak 0 No Impaired 0 No Mental Status Oriented to own ability 0 Yes Electronic Signature(s) Signed: 12/06/2015 4:32:43 PM By: Regan Lemming BSN, RN Entered By: Regan Lemming on 12/06/2015 08:41:49 Sagen, Aleka L. ([id number]) -------------------------------------------------------------------------------- Foot Assessment Details Patient Name: [name], [name] Date of Service: 12/06/2015 8:45 AM Medical Record Patient Account Number: [id number] Number: Treating RN: Baruch Gouty, RN, BSN, Rita 09/11/1940 [phone] y.o. Other Clinician: Date of Birth/Sex: Female) Treating ROBSON, MICHAEL Primary Care Physician/Extender: Landis Gandy, Izora Gala Physician: Referring Physician: [name] in Treatment: 0 Foot Assessment Items Site Locations + = Sensation present, - = Sensation absent, C = Callus, U = Ulcer R = Redness, W = Warmth, M = Maceration, PU = Pre-ulcerative lesion F = Fissure, S = Swelling, D = Dryness Assessment Right: Left: Other Deformity: No No Prior Foot Ulcer: No No Prior Amputation: No No Charcot Joint: No No Ambulatory Status: Ambulatory Without Help Gait: Steady Electronic Signature(s) Signed: 12/06/2015  4:32:43 PM By: Regan Lemming BSN, RN Entered By: Regan Lemming on 12/06/2015 08:42:06 Kelter, Ethel L. ([id number]) Weismann, [place] ([id number]) -------------------------------------------------------------------------------- Nutrition Risk Assessment Details Patient Name: Hilton, Brittnie L. Date of Service: 12/06/2015 8:45 AM Medical Record Patient Account Number: [id number] Number: Treating RN: Baruch Gouty, RN, BSN, Rita 08/24/1941 [phone] y.o. Other Clinician: Date of Birth/Sex: Female) Treating ROBSON, MICHAEL Primary Care Physician/Extender: Lonn [place] Physician: Referring Physician: [name] in Treatment: 0 Height (in): 65 Weight (lbs): 134 Body Mass Index (BMI): 22.3 Nutrition Risk Assessment Items NUTRITION RISK SCREEN: I have an illness or condition that made me change the kind and/or 0 No amount of food I eat I eat fewer than two meals per day 0 No I eat few fruits and vegetables, or milk products 0 No I have three or more drinks of beer, liquor or wine almost every day 0 No I have tooth or mouth problems that make it hard for me to eat 0 No I don't always have enough money to buy the food I need 0 No I eat alone most of the time 0 No I take three or more different prescribed or over-the-counter drugs a 0 No day Without wanting to, I have lost or gained 10 pounds in the last six 0 No months I am not always physically able to shop, cook and/or feed myself 0 No Nutrition Protocols Good Risk Protocol 0 No interventions needed Moderate Risk Protocol Electronic Signature(s) Signed: 12/06/2015 4:32:43 PM By: Regan Lemming BSN, RN Entered By: Regan Lemming on 12/06/2015 08:41:56

## 2015-12-11 ENCOUNTER — Telehealth: Payer: Self-pay

## 2015-12-11 DIAGNOSIS — R911 Solitary pulmonary nodule: Secondary | ICD-10-CM

## 2015-12-11 NOTE — Telephone Encounter (Signed)
Patient called and stated she was suppose to call Dr. Genevive Bi for test results on Friday and was traveling and forgot to do so. Will speak with Dr. Genevive Bi while in clinic today and return call to patient.

## 2015-12-11 NOTE — Telephone Encounter (Signed)
Spoke with Dr. Genevive Bi and he will call patient with results, however he would like to obtain a CT scan in 3 months and have patient follow up in clinic.  Orders placed at this time. CT scheduled for 03/14/16 at [place] location. 11am, patient to arrive at 10:30am. Nothing to eat or drink 4 hours prior to CT scan.  Follow up in office with Dr. Genevive Bi on 03/19/16 at 9:00 am to discuss CT test results.  Appointment made .  Patient was notified of CT appointment @ 10:30 and follow up appointment on 03/19/16 in office and understands completely.

## 2015-12-13 ENCOUNTER — Ambulatory Visit: Payer: PPO | Admitting: Internal Medicine

## 2015-12-13 ENCOUNTER — Encounter: Payer: PPO | Admitting: Internal Medicine

## 2015-12-13 DIAGNOSIS — L97213 Non-pressure chronic ulcer of right calf with necrosis of muscle: Secondary | ICD-10-CM | POA: Diagnosis not present

## 2015-12-13 DIAGNOSIS — S81811A Laceration without foreign body, right lower leg, initial encounter: Secondary | ICD-10-CM | POA: Diagnosis not present

## 2015-12-14 NOTE — Progress Notes (Signed)
TYREANNA, GHAZAL ([id number]) Visit Report for 12/13/2015 Chief Complaint Document Details Patient Name: [name] [name], [name] Date of Service: 12/13/2015 2:15 PM Medical Record Patient Account Number: [id number] Number: Treating RN: Baruch Gouty, RN, BSN, Rita 1940-11-04 [phone] y.o. Other Clinician: Date of Birth/Sex: Female) Treating Graciella Arment Primary Care Physician/Extender: Lonn Georgia Physician: Referring Physician: [name] in Treatment: 1 Information Obtained from: Patient Chief Complaint Patient is here for a chronic wound on her right anterior lateral leg Electronic Signature(s) Signed: 12/13/2015 5:01:27 PM By: Linton Ham MD Entered By: Linton Ham on 12/13/2015 14:39:50 [name] [name], Donley ([id number]) -------------------------------------------------------------------------------- Debridement Details Patient Name: Hartje, [name] Date of Service: 12/13/2015 2:15 PM Medical Record Patient Account Number: [id number] Number: Treating RN: Baruch Gouty, RN, BSN, Rita 28-Mar-1941 [phone] y.o. Other Clinician: Date of Birth/Sex: Female) Treating Pelagia Iacobucci Primary Care Physician/Extender: Lonn Georgia Physician: Referring Physician: [name] in Treatment: 1 Debridement Performed for Wound #1 Right,Lateral Lower Leg Assessment: Performed By: Physician Ricard Dillon, MD Debridement: Debridement Pre-procedure Yes Verification/Time Out Taken: Start Time: 14:30 Pain Control: Lidocaine 4% Topical Solution Level: Skin/Subcutaneous Tissue Total Area Debrided (L x 1.8 (cm) x 1.5 (cm) = 2.7 (cm) W): Tissue and other Non-Viable, Fibrin/Slough, Subcutaneous material debrided: Instrument: Curette Bleeding: Minimum Hemostasis Achieved: Pressure End Time: 14:35 Procedural Pain: 0 Post Procedural Pain: 0 Response to Treatment: Procedure was tolerated well Post Debridement Measurements of Total Wound Length: (cm) 1.8 Width: (cm)  1.5 Depth: (cm) 0.5 Volume: (cm) 1.06 Post Procedure Diagnosis Same as Pre-procedure Electronic Signature(s) Signed: 12/13/2015 3:02:50 PM By: Regan Lemming BSN, RN Signed: 12/13/2015 5:01:27 PM By: Linton Ham MD Entered By: Linton Ham on 12/13/2015 14:39:15 [name] [name], [name] [name] [name], [name] -------------------------------------------------------------------------------- HPI Details Patient Name: Christiano, Syble L. Date of Service: 12/13/2015 2:15 PM Medical Record Patient Account Number: [id number] Number: Treating RN: Baruch Gouty, RN, BSN, Rita 03-15-1941 [phone] y.o. Other Clinician: Date of Birth/Sex: Female) Treating Jama Mcmiller Primary Care Physician/Extender: Lonn Georgia Physician: Referring Physician: [name] in Treatment: 1 History of Present Illness HPI Description: 12/06/15; this is a patient who has no prior wound history and is not a diabetic. In February she was getting into her family truck and the wind pushed the door against the outer aspect of her right lower leg. I believe there was initially some swelling but not clearly a hematoma. 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 Has been using topical antibiotics on this. 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 There is already been some improvement. She has no prior history of PAD and no major history of venous insufficiency. 12/13/15; wound appears health, using prisma Electronic Signature(s) Signed: 12/13/2015 5:01:27 PM By: Linton Ham MD Entered By: Linton Ham on 12/13/2015 14:40:32 [name] [name], [name] -------------------------------------------------------------------------------- Physical Exam Details Patient Name: Wery, Kailyn L. Date of Service: 12/13/2015 2:15 PM Medical Record Patient Account Number: [id number] Number: Treating RN: Baruch Gouty, RN, BSN, Rita [birth year] [phone] y.o. Other  Clinician: Date of Birth/Sex: Female) Treating Kadian Barcellos Primary Care Physician/Extender: Lonn Georgia Physician: Referring Physician: [name] in Treatment: 1 Notes Wound exam: debridement. less surface slough. less nonviable s/c tissue. no infection Electronic Signature(s) Signed: 12/13/2015 5:01:27 PM By: Linton Ham MD Entered By: Linton Ham on 12/13/2015 14:41:27 [name] [name], [name] -------------------------------------------------------------------------------- Physician Orders Details Patient Name: Flammia, Luanne L. Date of Service: 12/13/2015 2:15 PM Medical Record Patient Account Number: [id number]  [id number] Number: Treating RN: Baruch Gouty, RN, BSN, Rita 02-26-1941 [phone] y.o. Other Clinician: Date of Birth/Sex: Female) Treating Rylyn Zawistowski Primary Care Physician/Extender: Lonn Georgia Physician: Referring Physician: [name] in Treatment: 1 Verbal / Phone Orders: Yes Clinician: Afful, RN, BSN, Rita Read Back and Verified: Yes Diagnosis Coding Wound Cleansing Wound #1 Right,Lateral Lower Leg o Cleanse wound with mild soap and water o May Shower, gently pat wound dry prior to applying new dressing. o May shower with protection. Wound #2 Right,Distal Lower Leg o Cleanse wound with mild soap and water o May Shower, gently pat wound dry prior to applying new dressing. o May shower with protection. Anesthetic Wound #1 Right,Lateral Lower Leg o Topical Lidocaine 4% cream applied to wound bed prior to debridement - Used only in the clinic Wound #2 Right,Distal Lower Leg o Topical Lidocaine 4% cream applied to wound bed prior to debridement - Used only in the clinic Skin Barriers/Peri-Wound Care Wound #1 Right,Lateral Lower Leg o Skin Prep Wound #2 Right,Distal Lower Leg o Skin Prep Primary Wound Dressing Wound #1 Right,Lateral Lower Leg o Prisma Ag Wound #2 Right,Distal Lower Leg o Prisma  Ag Secondary Dressing Jantz, Zamaya L. ([id number]) Wound #1 Right,Lateral Lower Leg o Dry Gauze Wound #2 Right,Distal Lower Leg o Dry Gauze Dressing Change Frequency Wound #1 Right,Lateral Lower Leg o Change dressing every other day. Wound #2 Right,Distal Lower Leg o Change dressing every other day. Follow-up Appointments Wound #1 Right,Lateral Lower Leg o Return Appointment in 1 week. Wound #2 Right,Distal Lower Leg o Return Appointment in 1 week. Edema Control Wound #1 Right,Lateral Lower Leg o 3 Layer Compression System - Right Lower Extremity Wound #2 Right,Distal Lower Leg o 3 Layer Compression System - Right Lower Extremity Additional Orders / Instructions Wound #1 Right,Lateral Lower Leg o Increase protein intake. o Activity as tolerated Wound #2 Right,Distal Lower Leg o Increase protein intake. o Activity as tolerated Electronic Signature(s) Signed: 12/13/2015 3:02:50 PM By: Regan Lemming BSN, RN Signed: 12/13/2015 5:01:27 PM By: Linton Ham MD Entered By: Regan Lemming on 12/13/2015 14:35:56 [name] [name], [name] ([id number]) -------------------------------------------------------------------------------- Problem List Details Patient Name: Arizola, Aowyn L. Date of Service: 12/13/2015 2:15 PM Medical Record Patient Account Number: [id number] Number: Treating RN: Baruch Gouty, RN, BSN, Rita 1940/10/31 [phone] y.o. 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 ([id number]) -------------------------------------------------------------------------------- Progress Note Details Patient Name: [name] [name], [name] Date of Service: 12/13/2015 2:15 PM Medical Record Patient Account Number: [id number] Number: Treating RN: Baruch Gouty, RN, BSN, Rita 08/23/1941 [phone] y.o. Other Clinician: Date of Birth/Sex: Female) Treating Kaisey Huseby Primary Care Physician/Extender: Lonn Georgia Physician: Referring Physician: [name] in Treatment: 1 Subjective Chief Complaint Information obtained from Patient Patient is here for a chronic wound on her right anterior lateral leg History of Present Illness (HPI) 12/06/15; this is a patient who has no prior wound history and is not a diabetic. In February she was getting into her family truck and the wind pushed the door against the outer aspect of her right lower leg. I believe there was initially some swelling  but not clearly a hematoma. 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 Has been using topical antibiotics on this. 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 There is already been some improvement. She has no prior history of PAD and no major history of venous insufficiency. 12/13/15; wound appears health, using prisma Objective Constitutional Vitals Time Taken: 2:25 PM, Height: 65 in, Weight: 134 lbs, BMI: 22.3, Temperature: 97.7 F, Pulse: 73 bpm, Respiratory Rate: 17 breaths/min, Blood Pressure: 126/57 mmHg. Integumentary (Hair, Skin) Wound #1 status is Open. Original cause of wound was Trauma. The wound is located on the Right,Lateral Lower Leg. The wound measures 1.8cm length x 1.5cm width x 0.5cm depth; 2.121cm^2 area and 1.06cm^3 volume. The wound is limited to skin breakdown. There is no undermining noted, however, there is tunneling at 3:00 with a maximum distance of 0.5cm. There is a medium  amount of serosanguineous drainage noted. The wound margin is distinct with the outline attached to the wound base. There is small (1-33%) granulation within the wound bed. There is a large (67-100%) amount of necrotic tissue within the wound bed including Adherent Slough. The periwound skin appearance exhibited: Localized Edema, Moist. The periwound skin appearance did not exhibit: Callus, Crepitus, Excoriation, Fluctuance, Friable, Induration, Rash, Scarring, Dry/Scaly, Maceration, Atrophie Blanche, Cyanosis, Ecchymosis, Hemosiderin Medici, Roneisha L. ([id number]) Staining, Mottled, Pallor, Rubor, Erythema. Periwound temperature was noted as No Abnormality. The periwound has tenderness on palpation. Wound #2 status is Open. Original cause of wound was Blister. The wound is located on the Right,Distal Lower Leg. The wound measures 1cm length x 1.4cm width x 0.1cm depth; 1.1cm^2 area and 0.11cm^3 volume. The wound is limited to skin breakdown. There is no tunneling or undermining noted. There is a medium amount of serosanguineous drainage noted. The wound margin is distinct with the outline attached to the wound base. There is large (67-100%) pink, pale granulation within the wound bed. There is no necrotic tissue within the wound bed. The periwound skin appearance exhibited: Localized Edema, Moist. The periwound skin appearance did not exhibit: Callus, Crepitus, Excoriation, Fluctuance, Friable, Induration, Rash, Scarring, Dry/Scaly, Maceration, Atrophie Blanche, Cyanosis, Ecchymosis, Hemosiderin Staining, Mottled, Pallor, Rubor, Erythema. Periwound temperature was noted as No Abnormality. The periwound has tenderness on palpation. 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 There was a  Skin/Subcutaneous Tissue Debridement [id number]) debridement with total area of 2.7 sq cm performed by Ricard Dillon, MD. with the following instrument(s): Curette to remove Non-Viable tissue/material including Fibrin/Slough and Subcutaneous after achieving pain control using Lidocaine 4% Topical Solution. A time out was conducted prior to the start of the procedure. A Minimum amount of bleeding was controlled with Pressure. The procedure was tolerated well with a pain level of 0 throughout and a pain level of 0 following the procedure. Post Debridement Measurements: 1.8cm length x 1.5cm width x 0.5cm depth; 1.06cm^3 volume. Post procedure Diagnosis Wound #1: Same as Pre-Procedure Plan [name] [name], [name] ([id number]) Wound Cleansing: Wound #1 Right,Lateral Lower Leg: Cleanse wound with mild soap and water May Shower, gently pat wound dry prior to applying new dressing. May shower with protection. Wound #2 Right,Distal Lower Leg: Cleanse wound with mild soap and water May Shower, gently pat wound dry prior to applying new dressing. May shower with protection. Anesthetic: Wound #  1 Right,Lateral Lower Leg: Topical Lidocaine 4% cream applied to wound bed prior to debridement - Used only in the clinic Wound #2 Right,Distal Lower Leg: Topical Lidocaine 4% cream applied to wound bed prior to debridement - Used only in the clinic Skin Barriers/Peri-Wound Care: Wound #1 Right,Lateral Lower Leg: Skin Prep Wound #2 Right,Distal Lower Leg: Skin Prep Primary Wound Dressing: Wound #1 Right,Lateral Lower Leg: Prisma Ag Wound #2 Right,Distal Lower Leg: Prisma Ag Secondary Dressing: Wound #1 Right,Lateral Lower Leg: Dry Gauze Wound #2 Right,Distal Lower Leg: Dry Gauze Dressing Change Frequency: Wound #1 Right,Lateral Lower Leg: Change dressing every other day. Wound #2 Right,Distal Lower Leg: Change dressing every other day. Follow-up Appointments: Wound #1 Right,Lateral Lower  Leg: Return Appointment in 1 week. Wound #2 Right,Distal Lower Leg: Return Appointment in 1 week. Edema Control: Wound #1 Right,Lateral Lower Leg: 3 Layer Compression System - Right Lower Extremity Wound #2 Right,Distal Lower Leg: 3 Layer Compression System - Right Lower Extremity Additional Orders / Instructions: Wound #1 Right,Lateral Lower Leg: Increase protein intake. Activity as tolerated Wound #2 Right,Distal Lower Leg: Increase protein intake. [name] [name], [name] ([id number]) Activity as tolerated 1 continue prisma, profore lite Electronic Signature(s) Signed: 12/13/2015 5:01:27 PM By: Linton Ham MD Entered By: Linton Ham on 12/13/2015 14:42:06 [name] [name], [name] ([id number]) -------------------------------------------------------------------------------- SuperBill Details Patient Name: [name] [name], [name] Date of Service: 12/13/2015 Medical Record Patient Account Number: [id number] Number: Treating RN: Baruch Gouty, RN, BSN, Rita Apr 24, 1941 [phone] y.o. Other Clinician: Date of Birth/Sex: Female) Treating Izel Hochberg Primary Care Physician/Extender: Lonn Georgia Physician: Suella [place] in Treatment: 1 Referring Physician: Margarita Rana Diagnosis Coding ICD-10 Codes Code Description [phone] Non-pressure chronic ulcer of right calf with necrosis of muscle I87.2 Venous insufficiency (chronic) (peripheral) Facility Procedures CPT4 Code Description: [id number] - DEB SUBQ TISSUE 20 SQ CM/< ICD-10 Description Diagnosis L97.213 Non-pressure chronic ulcer of right calf with necro Modifier: sis of muscl Quantity: 1 e Physician Procedures CPT4 Code Description: [id number] - WC PHYS SUBQ TISS 20 SQ CM ICD-10 Description Diagnosis L97.213 Non-pressure chronic ulcer of right calf with necro Modifier: sis of muscle Quantity: 1 Electronic Signature(s) Signed: 12/13/2015 5:01:27 PM By: Linton Ham MD Entered By: Linton Ham on 12/13/2015 14:42:32

## 2015-12-14 NOTE — Progress Notes (Signed)
IDONNA, KNOCHE ([id number]) Visit Report for 12/13/2015 Arrival Information Details Patient Name: Rog, Sima L. Date of Service: 12/13/2015 2:15 PM Medical Record Patient Account Number: [id number] Number: Treating RN: Baruch Gouty, RN, BSN, Rita 28-Aug-1940 [phone] y.o. Other Clinician: Date of Birth/Sex: Female) Treating ROBSON, [place] Primary Care Physician: Margarita Rana Physician/Extender: G Referring Physician: [name] in Treatment: 1 Visit Information History Since Last Visit Added or deleted any medications: No Patient Arrived: Ambulatory Any new allergies or adverse reactions: No Arrival Time: 14:11 Had a fall or experienced change in No Accompanied By: self activities of daily living that may affect Transfer Assistance: None risk of falls: Patient Identification Verified: Yes Signs or symptoms of abuse/neglect since last No Secondary Verification Process Yes visito Completed: Hospitalized since last visit: No Patient Requires Transmission-Based No Has Dressing in Place as Prescribed: Yes Precautions: Has Compression in Place as Prescribed: Yes Patient Has Alerts: No Pain Present Now: No Electronic Signature(s) Signed: 12/13/2015 3:02:50 PM By: Regan Lemming BSN, RN Entered By: Regan Lemming on 12/13/2015 14:12:14 Leeds, Aviendha L. ([id number]) -------------------------------------------------------------------------------- Encounter Discharge Information Details Patient Name: Sowell, Emonii L. Date of Service: 12/13/2015 2:15 PM Medical Record Patient Account Number: [id number] Number: Treating RN: Baruch Gouty, RN, BSN, Rita May 18, 1941 [phone] y.o. Other Clinician: Date of Birth/Sex: Female) Treating ROBSON, MICHAEL Primary Care Physician: Margarita Rana Physician/Extender: G Referring Physician: [name] in Treatment: 1 Encounter Discharge Information Items Discharge Pain Level: 0 Discharge Condition: Stable Ambulatory Status:  Ambulatory Discharge Destination: Home Transportation: Private Auto Accompanied By: self Schedule Follow-up Appointment: No Medication Reconciliation completed and provided to Patient/Care No Duel Conrad: Provided on Clinical Summary of Care: 12/13/2015 Form Type Recipient Paper Patient NB Electronic Signature(s) Signed: 12/13/2015 4:47:37 PM By: Regan Lemming BSN, RN Previous Signature: 12/13/2015 2:48:33 PM Version By: Ruthine Dose Entered By: Regan Lemming on 12/13/2015 16:47:37 Rutten, Yevette L. ([id number]) -------------------------------------------------------------------------------- Lower Extremity Assessment Details Patient Name: Walberg, Hartlee L. Date of Service: 12/13/2015 2:15 PM Medical Record Patient Account Number: [id number] Number: Treating RN: Baruch Gouty, RN, BSN, Rita 1941/08/15 [phone] y.o. Other Clinician: Date of Birth/Sex: Female) Treating ROBSON, [place] Primary Care Physician: Margarita Rana Physician/Extender: G Referring Physician: [name] in Treatment: 1 Edema Assessment Assessed: [Left: No] [Right: No] Edema: [Left: Ye] [Right: s] Calf Left: Right: Point of Measurement: 32 cm From Medial Instep cm 36.1 cm Ankle Left: Right: Point of Measurement: 7 cm From Medial Instep cm 21.5 cm Vascular Assessment Claudication: Claudication Assessment [Right:None] Pulses: Posterior Tibial Dorsalis Pedis Palpable: [Right:Yes] Extremity colors, hair growth, and conditions: Extremity Color: [Right:Mottled] Hair Growth on Extremity: [Right:No] Temperature of Extremity: [Right:Warm] Capillary Refill: [Right:< 3 seconds] Toe Nail Assessment Left: Right: Thick: Yes Discolored: Yes Deformed: No Improper Length and Hygiene: No Electronic Signature(s) Signed: 12/13/2015 3:02:50 PM By: Regan Lemming BSN, RN Deyarmin, Ginette L. ([id number]) Entered By: Regan Lemming on 12/13/2015 14:17:26 Danielsen, Havilah L.  ([id number]) -------------------------------------------------------------------------------- Multi Wound Chart Details Patient Name: Utt, Blaike L. Date of Service: 12/13/2015 2:15 PM Medical Record Patient Account Number: [id number] Number: Treating RN: Baruch Gouty, RN, BSN, Rita May 25, 1941 [phone] y.o. Other Clinician: Date of Birth/Sex: Female) Treating ROBSON, [place] Primary Care Physician: Margarita Rana Physician/Extender: G Referring Physician: [name] in Treatment: 1 Vital Signs Height(in): 65 Pulse(bpm): 73 Weight(lbs): 134 Blood Pressure 126/57 (mmHg): Body Mass Index(BMI): 22 Temperature(F): 97.7 Respiratory Rate 17 (breaths/min): Photos: [1:No Photos] [2:No Photos] [N/A:N/A] Wound Location: [1:Right Lower Leg - Lateral] [2:Right Lower Leg - Distal] [N/A:N/A] Wounding Event: [1:Trauma] [2:Blister] [  N/A:N/A] Primary Etiology: [1:Skin Tear] [2:Trauma, Other] [N/A:N/A] Comorbid History: [1:Cataracts, Chronic Obstructive Pulmonary Disease (COPD), Hypertension, Osteoarthritis, Neuropathy, Confinement Anxiety] [2:Cataracts, Chronic Obstructive Pulmonary Disease (COPD), Hypertension, Osteoarthritis, Neuropathy, Confinement  Anxiety] [N/A:N/A] Date Acquired: [1:10/11/2015] [2:12/13/2015] [N/A:N/A] Weeks of Treatment: [1:1] [2:0] [N/A:N/A] Wound Status: [1:Open] [2:Open] [N/A:N/A] Measurements L x W x D 1.8x1.5x0.5 [2:1x1.4x0.1] [N/A:N/A] (cm) Area (cm) : [1:2.121] [2:1.1] [N/A:N/A] Volume (cm) : [1:1.06] [2:0.11] [N/A:N/A] % Reduction in Area: [1:32.50%] [2:N/A] [N/A:N/A] % Reduction in Volume: -237.60% [2:N/A] [N/A:N/A] Classification: [1:Full Thickness Without Exposed Support Structures] [2:Full Thickness Without Exposed Support Structures] [N/A:N/A] Exudate Amount: [1:Medium] [2:Medium] [N/A:N/A] Exudate Type: [1:Serosanguineous] [2:Serosanguineous] [N/A:N/A] Exudate Color: [1:red, brown] [2:red, brown] [N/A:N/A] Wound Margin: [1:Distinct, outline  attached] [2:Distinct, outline attached] [N/A:N/A] Granulation Amount: [1:Small (1-33%)] [2:Large (67-100%)] [N/A:N/A] Granulation Quality: N/A Pink, Pale N/A Necrotic Amount: Large (67-100%) None Present (0%) N/A Exposed Structures: Fascia: No Fascia: No N/A Fat: No Fat: No Tendon: No Tendon: No Muscle: No Muscle: No Joint: No Joint: No Bone: No Bone: No Limited to Skin Limited to Skin Breakdown Breakdown Epithelialization: None None N/A Periwound Skin Texture: Edema: Yes Edema: Yes N/A Excoriation: No Excoriation: No Induration: No Induration: No Callus: No Callus: No Crepitus: No Crepitus: No Fluctuance: No Fluctuance: No Friable: No Friable: No Rash: No Rash: No Scarring: No Scarring: No Periwound Skin Moist: Yes Moist: Yes N/A Moisture: Maceration: No Maceration: No Dry/Scaly: No Dry/Scaly: No Periwound Skin Color: Atrophie Blanche: No Atrophie Blanche: No N/A Cyanosis: No Cyanosis: No Ecchymosis: No Ecchymosis: No Erythema: No Erythema: No Hemosiderin Staining: No Hemosiderin Staining: No Mottled: No Mottled: No Pallor: No Pallor: No Rubor: No Rubor: No Temperature: No Abnormality No Abnormality N/A Tenderness on Yes Yes N/A Palpation: Wound Preparation: Ulcer Cleansing: Ulcer Cleansing: Other: N/A Rinsed/Irrigated with water and soap Saline Topical Anesthetic Topical Anesthetic Applied: Other: lidociane Applied: Other: lidocaine 4% 4% Treatment Notes Electronic Signature(s) Signed: 12/13/2015 3:02:50 PM By: Regan Lemming BSN, RN Entered By: Regan Lemming on 12/13/2015 14:26:48 Petrovic, [name] ([id number]) -------------------------------------------------------------------------------- [place] Details Patient Name: Meetze, Jennylee L. Date of Service: 12/13/2015 2:15 PM Medical Record Patient Account Number: [id number] Number: Treating RN: Baruch Gouty, RN, BSN, Rita [birth year] [phone] y.o. Other Clinician: Date of  Birth/Sex: Female) Treating ROBSON, MICHAEL Primary Care Physician: Margarita Rana Physician/Extender: G Referring Physician: [name] in Treatment: 1 Active Inactive Orientation to the Wound Care Program Nursing Diagnoses: Knowledge deficit related to the wound healing center program Goals: Patient/caregiver will verbalize understanding of the [facility] Date Initiated: 12/06/2015 Goal Status: Active Interventions: Provide education on orientation to the wound center Notes: Venous Leg Ulcer Nursing Diagnoses: Knowledge deficit related to disease process and management Potential for venous Insuffiency (use before diagnosis confirmed) Goals: Non-invasive venous studies are completed as ordered Date Initiated: 12/06/2015 Goal Status: Active Patient will maintain optimal edema control Date Initiated: 12/06/2015 Goal Status: Active Patient/caregiver will verbalize understanding of disease process and disease management Date Initiated: 12/06/2015 Goal Status: Active Verify adequate tissue perfusion prior to therapeutic compression application Date Initiated: 12/06/2015 Goal Status: Active Dunnavant, Alanah L. ([id number]) Interventions: Assess peripheral edema status every visit. Compression as ordered Provide education on venous insufficiency Treatment Activities: Non-invasive vascular studies : 12/13/2015 Therapeutic compression applied : 12/13/2015 Notes: Wound/Skin Impairment Nursing Diagnoses: Impaired tissue integrity Knowledge deficit related to smoking impact on wound healing Knowledge deficit related to ulceration/compromised skin integrity Goals: Patient/caregiver will verbalize understanding of skin care regimen Date Initiated: 12/06/2015 Goal Status: Active Ulcer/skin breakdown will  have a volume reduction of 30% by week 4 Date Initiated: 12/06/2015 Goal Status: Active Ulcer/skin breakdown will have a volume reduction of 50% by week 8 Date  Initiated: 12/06/2015 Goal Status: Active Ulcer/skin breakdown will have a volume reduction of 80% by week 12 Date Initiated: 12/06/2015 Goal Status: Active Ulcer/skin breakdown will heal within 14 weeks Date Initiated: 12/06/2015 Goal Status: Active Interventions: Assess patient/caregiver ability to perform ulcer/skin care regimen upon admission and as needed Assess ulceration(s) every visit Provide education on ulcer and skin care Treatment Activities: Referred to DME Rebeckah Masih for dressing supplies : 12/13/2015 Skin care regimen initiated : 12/13/2015 Topical wound management initiated : 12/13/2015 MARNAE, CHILTON ([id number]) Notes: Electronic Signature(s) Signed: 12/13/2015 3:02:50 PM By: Regan Lemming BSN, RN Entered By: Regan Lemming on 12/13/2015 14:26:40 Debono, Ishanvi L. ([id number]) -------------------------------------------------------------------------------- Pain Assessment Details Patient Name: Lawless, Chong L. Date of Service: 12/13/2015 2:15 PM Medical Record Patient Account Number: [id number] Number: Treating RN: Baruch Gouty, RN, BSN, Rita May 18, 1941 [phone] y.o. Other Clinician: Date of Birth/Sex: Female) Treating ROBSON, MICHAEL Primary Care Physician: Margarita Rana Physician/Extender: G Referring Physician: [name] in Treatment: 1 Active Problems Location of Pain Severity and Description of Pain Patient Has Paino No Site Locations Pain Management and Medication Current Pain Management: Electronic Signature(s) Signed: 12/13/2015 3:02:50 PM By: Regan Lemming BSN, RN Entered By: Regan Lemming on 12/13/2015 14:12:21 Olshefski, Laasya L. ([id number]) -------------------------------------------------------------------------------- Patient/Caregiver Education Details Patient Name: Worland, Nohea L. Date of Service: 12/13/2015 2:15 PM Medical Record Patient Account Number: [id number] Number: Treating RN: Baruch Gouty, RN, BSN, Rita [birth year] [phone] y.o. Other  Clinician: Date of Birth/Gender: Female) Treating ROBSON, [place] Primary Care Physician: Margarita Rana Physician/Extender: G Referring Physician: [name] in Treatment: 1 Education Assessment Education Provided To: Patient Education Topics Provided Basic Hygiene: Methods: Explain/Verbal Responses: State content correctly Venous: Methods: Explain/Verbal Responses: State content correctly [facility]: Methods: Explain/Verbal Responses: State content correctly Wound/Skin Impairment: Methods: Explain/Verbal Responses: State content correctly Electronic Signature(s) Signed: 12/13/2015 4:47:58 PM By: Regan Lemming BSN, RN Entered By: Regan Lemming on 12/13/2015 16:47:58 Sikorski, Aliveah L. ([id number]) -------------------------------------------------------------------------------- Wound Assessment Details Patient Name: Novitski, Baeleigh L. Date of Service: 12/13/2015 2:15 PM Medical Record Patient Account Number: [id number] Number: Treating RN: Baruch Gouty, RN, BSN, Rita 14-Apr-1941 [phone] y.o. Other Clinician: Date of Birth/Sex: Female) Treating ROBSON, [place] Primary Care Physician: Margarita Rana Physician/Extender: G Referring Physician: [name] in Treatment: 1 Wound Status Wound Number: 1 Primary Skin Tear Etiology: Wound Location: Right Lower Leg - Lateral Wound Open Wounding Event: Trauma Status: Date Acquired: 10/11/2015 Comorbid Cataracts, Chronic Obstructive Weeks Of Treatment: 1 History: Pulmonary Disease (COPD), Clustered Wound: No Hypertension, Osteoarthritis, Neuropathy, Confinement Anxiety Photos Photo Uploaded By: Regan Lemming on 12/13/2015 16:45:00 Wound Measurements Length: (cm) 1.8 Width: (cm) 1.5 Depth: (cm) 0.5 Area: (cm) 2.121 Volume: (cm) 1.06 % Reduction in Area: 32.5% % Reduction in Volume: -237.6% Epithelialization: None Tunneling: Yes Position (o'clock): 3 Maximum Distance: (cm)  0.5 Undermining: No Stoneham, Tiffanyann L. ([id number]) Wound Description Full Thickness Without Exposed Classification: Support Structures Wound Margin: Distinct, outline attached Exudate Medium Amount: Exudate Type: Serosanguineous Exudate Color: red, brown Foul Odor After Cleansing: No Wound Bed Granulation Amount: Small (1-33%) Exposed Structure Necrotic Amount: Large (67-100%) Fascia Exposed: No Necrotic Quality: Adherent Slough Fat Layer Exposed: No Tendon Exposed: No Muscle Exposed: No Joint Exposed: No Bone Exposed: No Limited to Skin Breakdown Periwound Skin Texture Texture Color No Abnormalities Noted: No No Abnormalities Noted: No  Callus: No Atrophie Blanche: No Crepitus: No Cyanosis: No Excoriation: No Ecchymosis: No Fluctuance: No Erythema: No Friable: No Hemosiderin Staining: No Induration: No Mottled: No Localized Edema: Yes Pallor: No Rash: No Rubor: No Scarring: No Temperature / Pain Moisture Temperature: No Abnormality No Abnormalities Noted: No Tenderness on Palpation: Yes Dry / Scaly: No Maceration: No Moist: Yes Wound Preparation Ulcer Cleansing: Rinsed/Irrigated with Saline Topical Anesthetic Applied: Other: lidocaine 4%, Treatment Notes Wound #1 (Right, Lateral Lower Leg) 1. Cleansed with: Cleanse wound with antibacterial soap and water 3. Peri-wound Care: YARETZY, VANHORN. ([id number]) Barrier cream 4. Dressing Applied: Prisma Ag 5. Secondary Dressing Applied Dry Gauze 7. Secured with 3 Layer Compression System - Right Lower Extremity Electronic Signature(s) Signed: 12/13/2015 3:02:50 PM By: Regan Lemming BSN, RN Entered By: Regan Lemming on 12/13/2015 14:36:37 Gassner, Cassidy L. ([id number]) -------------------------------------------------------------------------------- Wound Assessment Details Patient Name: Cadenas, Yanette L. Date of Service: 12/13/2015 2:15 PM Medical Record Patient Account Number:  [id number] Number: Treating RN: Baruch Gouty, RN, BSN, Rita 02-Apr-1941 [phone] y.o. Other Clinician: Date of Birth/Sex: Female) Treating ROBSON, MICHAEL Primary Care Physician: Margarita Rana Physician/Extender: G Referring Physician: [name] in Treatment: 1 Wound Status Wound Number: 2 Primary Trauma, Other Etiology: Wound Location: Right Lower Leg - Distal Wound Open Wounding Event: Blister Status: Date Acquired: 12/13/2015 Comorbid Cataracts, Chronic Obstructive Weeks Of Treatment: 0 History: Pulmonary Disease (COPD), Clustered Wound: No Hypertension, Osteoarthritis, Neuropathy, Confinement Anxiety Photos Photo Uploaded By: Regan Lemming on 12/13/2015 16:45:01 Wound Measurements Length: (cm) 1 Width: (cm) 1.4 Depth: (cm) 0.1 Area: (cm) 1.1 Volume: (cm) 0.11 % Reduction in Area: % Reduction in Volume: Epithelialization: None Tunneling: No Undermining: No Wound Description Classification: Wool, Christabelle L. ([id number]) Foul Odor After Cleansing: No Full Thickness Without Exposed Support Structures Wound Margin: Distinct, outline attached Exudate Medium Amount: Exudate Type: Serosanguineous Exudate Color: red, brown Wound Bed Granulation Amount: Large (67-100%) Exposed Structure Granulation Quality: Pink, Pale Fascia Exposed: No Necrotic Amount: None Present (0%) Fat Layer Exposed: No Tendon Exposed: No Muscle Exposed: No Joint Exposed: No Bone Exposed: No Limited to Skin Breakdown Periwound Skin Texture Texture Color No Abnormalities Noted: No No Abnormalities Noted: No Callus: No Atrophie Blanche: No Crepitus: No Cyanosis: No Excoriation: No Ecchymosis: No Fluctuance: No Erythema: No Friable: No Hemosiderin Staining: No Induration: No Mottled: No Localized Edema: Yes Pallor: No Rash: No Rubor: No Scarring: No Temperature / Pain Moisture Temperature: No Abnormality No Abnormalities Noted: No Tenderness on Palpation: Yes Dry  / Scaly: No Maceration: No Moist: Yes Wound Preparation Ulcer Cleansing: Other: water and soap, Topical Anesthetic Applied: Other: lidociane 4%, Treatment Notes Wound #2 (Right, Distal Lower Leg) 1. Cleansed with: Cleanse wound with antibacterial soap and water 3. Peri-wound Care: Barrier cream Speak, Malarie L. ([id number]) 4. Dressing Applied: Prisma Ag 5. Secondary Dressing Applied Dry Gauze 7. Secured with 3 Layer Compression System - Right Lower Extremity Electronic Signature(s) Signed: 12/13/2015 3:02:50 PM By: Regan Lemming BSN, RN Entered By: Regan Lemming on 12/13/2015 14:24:27 Krus, Soha L. ([id number]) -------------------------------------------------------------------------------- Vitals Details Patient Name: Kallman, Josslynn L. Date of Service: 12/13/2015 2:15 PM Medical Record Patient Account Number: [id number] Number: Treating RN: Baruch Gouty, RN, BSN, Rita 1941-06-18 [phone] y.o. Other Clinician: Date of Birth/Sex: Female) Treating ROBSON, MICHAEL Primary Care Physician: Margarita Rana Physician/Extender: G Referring Physician: [name] in Treatment: 1 Vital Signs Time Taken: 14:25 Temperature (F): 97.7 Height (in): 65 Pulse (bpm): 73 Weight (lbs): 134 Respiratory Rate (breaths/min): 17 Body Mass Index (BMI): 22.3 Blood  Pressure (mmHg): 126/57 Reference Range: 80 - 120 mg / dl Electronic Signature(s) Signed: 12/13/2015 3:02:50 PM By: Regan Lemming BSN, RN Entered By: Regan Lemming on 12/13/2015 14:26:33

## 2015-12-20 ENCOUNTER — Encounter: Payer: PPO | Admitting: Internal Medicine

## 2015-12-20 DIAGNOSIS — S81811A Laceration without foreign body, right lower leg, initial encounter: Secondary | ICD-10-CM | POA: Diagnosis not present

## 2015-12-20 DIAGNOSIS — L97213 Non-pressure chronic ulcer of right calf with necrosis of muscle: Secondary | ICD-10-CM | POA: Diagnosis not present

## 2015-12-21 NOTE — Progress Notes (Signed)
[name], [name] ([id number]) Visit Report for 12/20/2015 Arrival Information Details Patient Name: [name] [name], [name] Date of Service: 12/20/2015 10:00 AM Medical Record Patient Account Number: [id number] Number: Treating RN: Baruch Gouty, RN, BSN, Velva Harman 1941/07/18 [phone] y.o. Other Clinician: Date of Birth/Sex: Female) Treating ROBSON, Paul Primary Care Physician: Margarita Rana Physician/Extender: G Referring Physician: [name] in Treatment: 2 Visit Information History Since Last Visit Added or deleted any medications: No Patient Arrived: Ambulatory Any new allergies or adverse reactions: No Arrival Time: 09:54 Had a fall or experienced change in No Accompanied By: self activities of daily living that may affect Transfer Assistance: None risk of falls: Patient Identification Verified: Yes Signs or symptoms of abuse/neglect since last No Secondary Verification Process Yes visito Completed: Hospitalized since last visit: No Patient Requires Transmission-Based No Has Dressing in Place as Prescribed: Yes Precautions: Has Compression in Place as Prescribed: Yes Patient Has Alerts: No Pain Present Now: No Electronic Signature(s) Signed: 12/20/2015 4:20:11 PM By: Regan Lemming BSN, RN Entered By: Regan Lemming on 12/20/2015 09:54:45 Hamme, Margerite L. ([id number]) -------------------------------------------------------------------------------- Clinic Level of Care Assessment Details Patient Name: [name], [name] Date of Service: 12/20/2015 10:00 AM Medical Record Patient Account Number: [id number] Number: Treating RN: Baruch Gouty, RN, BSN, Velva Harman 14-Jun-1941 [phone] y.o. Other Clinician: Date of Birth/Sex: Female) Treating ROBSON, Maguayo Primary Care Physician: Margarita Rana Physician/Extender: G Referring Physician: [name] in Treatment: 2 Clinic Level of Care Assessment Items TOOL 4 Quantity Score []  - Use when only an EandM is performed on FOLLOW-UP  visit 0 ASSESSMENTS - Nursing Assessment / Reassessment X - Reassessment of Co-morbidities (includes updates in patient status) 1 10 X - Reassessment of Adherence to Treatment Plan 1 5 ASSESSMENTS - Wound and Skin Assessment / Reassessment []  - Simple Wound Assessment / Reassessment - one wound 0 X - Complex Wound Assessment / Reassessment - multiple wounds 2 5 []  - Dermatologic / Skin Assessment (not related to wound area) 0 ASSESSMENTS - Focused Assessment []  - Circumferential Edema Measurements - multi extremities 0 []  - Nutritional Assessment / Counseling / Intervention 0 X - Lower Extremity Assessment (monofilament, tuning fork, pulses) 1 5 []  - Peripheral Arterial Disease Assessment (using hand held doppler) 0 ASSESSMENTS - Ostomy and/or Continence Assessment and Care []  - Incontinence Assessment and Management 0 []  - Ostomy Care Assessment and Management (repouching, etc.) 0 PROCESS - Coordination of Care []  - Simple Patient / Family Education for ongoing care 0 []  - Complex (extensive) Patient / Family Education for ongoing care 0 []  - Staff obtains Programmer, systems, Records, Test Results / Process Orders 0 []  - Staff telephones HHA, Nursing Homes / Clarify orders / etc 0 Rhyner, Simrin L. ([id number]) []  - Routine Transfer to another Facility (non-emergent condition) 0 []  - Routine Hospital Admission (non-emergent condition) 0 []  - New Admissions / Biomedical engineer / Ordering NPWT, Apligraf, etc. 0 []  - Emergency Hospital Admission (emergent condition) 0 []  - Simple Discharge Coordination 0 []  - Complex (extensive) Discharge Coordination 0 PROCESS - Special Needs []  - Pediatric / Minor Patient Management 0 []  - Isolation Patient Management 0 []  - Hearing / Language / Visual special needs 0 []  - Assessment of Community assistance (transportation, D/C planning, etc.) 0 []  - Additional assistance / Altered mentation 0 []  - Support Surface(s) Assessment (bed, cushion, seat, etc.)  0 INTERVENTIONS - Wound Cleansing / Measurement []  - Simple Wound Cleansing - one wound 0 X - Complex Wound Cleansing - multiple wounds 2 5 X -  Wound Imaging (photographs - any number of wounds) 1 5 []  - Wound Tracing (instead of photographs) 0 []  - Simple Wound Measurement - one wound 0 []  - Complex Wound Measurement - multiple wounds 0 INTERVENTIONS - Wound Dressings X - Small Wound Dressing one or multiple wounds 2 10 []  - Medium Wound Dressing one or multiple wounds 0 []  - Large Wound Dressing one or multiple wounds 0 []  - Application of Medications - topical 0 []  - Application of Medications - injection 0 Prioleau, Margarette L. ([id number]) INTERVENTIONS - Miscellaneous []  - External ear exam 0 []  - Specimen Collection (cultures, biopsies, blood, body fluids, etc.) 0 []  - Specimen(s) / Culture(s) sent or taken to Lab for analysis 0 []  - Patient Transfer (multiple staff / Harrel Lemon Lift / Similar devices) 0 []  - Simple Staple / Suture removal (25 or less) 0 []  - Complex Staple / Suture removal (26 or more) 0 []  - Hypo / Hyperglycemic Management (close monitor of Blood Glucose) 0 []  - Ankle / Brachial Index (ABI) - do not check if billed separately 0 X - Vital Signs 1 5 Has the patient been seen at the hospital within the last three years: Yes Total Score: 70 Level Of Care: New/Established - Level 2 Electronic Signature(s) Signed: 12/20/2015 10:28:11 AM By: Regan Lemming BSN, RN Entered By: Regan Lemming on 12/20/2015 10:28:09 Singleterry, Journey L. ([id number]) -------------------------------------------------------------------------------- Encounter Discharge Information Details Patient Name: [name], [name] Date of Service: 12/20/2015 10:00 AM Medical Record Patient Account Number: [id number] Number: Treating RN: Baruch Gouty, RN, BSN, Velva Harman [birth year] [phone] y.o. Other Clinician: Date of Birth/Sex: Female) Treating ROBSON, MICHAEL Primary Care Physician: Margarita Rana Physician/Extender:  G Referring Physician: [name] in Treatment: 2 Encounter Discharge Information Items Discharge Pain Level: 0 Discharge Condition: Stable Ambulatory Status: Ambulatory Discharge Destination: Home Transportation: Private Auto Accompanied By: self Schedule Follow-up Appointment: No Medication Reconciliation completed and provided to Patient/Care No Shawntez Dickison: Provided on Clinical Summary of Care: 12/20/2015 Form Type Recipient Paper Patient NB Electronic Signature(s) Signed: 12/20/2015 10:29:40 AM By: Regan Lemming BSN, RN Previous Signature: 12/20/2015 10:22:22 AM Version By: Ruthine Dose Entered By: Regan Lemming on 12/20/2015 10:29:40 Gander, Avleen L. ([id number]) -------------------------------------------------------------------------------- Lower Extremity Assessment Details Patient Name: Markham, Aneli L. Date of Service: 12/20/2015 10:00 AM Medical Record Patient Account Number: [id number] Number: Treating RN: Baruch Gouty, RN, BSN, Velva Harman 11/25/1940 [phone] y.o. Other Clinician: Date of Birth/Sex: Female) Treating ROBSON, Edison Primary Care Physician: Margarita Rana Physician/Extender: G Referring Physician: [name] in Treatment: 2 Edema Assessment Assessed: [Left: No] [Right: No] E[Left: dema] [Right: :] Calf Left: Right: Point of Measurement: 32 cm From Medial Instep cm 36 cm Ankle Left: Right: Point of Measurement: 7 cm From Medial Instep cm 21 cm Vascular Assessment Claudication: Claudication Assessment [Right:None] Pulses: Posterior Tibial Dorsalis Pedis Palpable: [Right:Yes] Extremity colors, hair growth, and conditions: Extremity Color: [Right:Mottled] Hair Growth on Extremity: [Right:Yes] Temperature of Extremity: [Right:Warm] Capillary Refill: [Right:< 3 seconds] Toe Nail Assessment Left: Right: Thick: Yes Discolored: No Deformed: No Improper Length and Hygiene: No Electronic Signature(s) Signed: 12/20/2015 4:20:11 PM By:  Regan Lemming BSN, RN Bodner, Gaila L. ([id number]) Entered By: Regan Lemming on 12/20/2015 10:01:39 Gaffey, Lissett L. ([id number]) -------------------------------------------------------------------------------- Multi Wound Chart Details Patient Name: Leitzel, Raeden L. Date of Service: 12/20/2015 10:00 AM Medical Record Patient Account Number: [id number] Number: Treating RN: Baruch Gouty, RN, BSN, Velva Harman [birth year] [phone] y.o. Other Clinician: Date of Birth/Sex: Female) Treating ROBSON, Bannock Primary Care Physician: Margarita Rana Physician/Extender: Darnell Level  Referring Physician: [name] in Treatment: 2 Vital Signs Height(in): 65 Pulse(bpm): 68 Weight(lbs): 134 Blood Pressure 133/49 (mmHg): Body Mass Index(BMI): 22 Temperature(F): 97.5 Respiratory Rate 18 (breaths/min): Photos: [1:No Photos] [2:No Photos] [N/A:N/A] Wound Location: [1:Right Lower Leg - Lateral] [2:Right Lower Leg - Distal] [N/A:N/A] Wounding Event: [1:Trauma] [2:Blister] [N/A:N/A] Primary Etiology: [1:Skin Tear] [2:Trauma, Other] [N/A:N/A] Comorbid History: [1:Cataracts, Chronic Obstructive Pulmonary Disease (COPD), Hypertension, Osteoarthritis, Neuropathy, Confinement Anxiety] [2:Cataracts, Chronic Obstructive Pulmonary Disease (COPD), Hypertension, Osteoarthritis, Neuropathy, Confinement  Anxiety] [N/A:N/A] Date Acquired: [1:10/11/2015] [2:12/13/2015] [N/A:N/A] Weeks of Treatment: [1:2] [2:1] [N/A:N/A] Wound Status: [1:Open] [2:Open] [N/A:N/A] Measurements L x W x D 1.5x1.4x0.3 [2:2x3x0.1] [N/A:N/A] (cm) Area (cm) : [1:1.649] [2:4.712] [N/A:N/A] Volume (cm) : [1:0.495] [2:0.471] [N/A:N/A] % Reduction in Area: [1:47.50%] [2:-328.40%] [N/A:N/A] % Reduction in Volume: -57.60% [2:-328.20%] [N/A:N/A] Classification: [1:Full Thickness Without Exposed Support Structures] [2:Full Thickness Without Exposed Support Structures] [N/A:N/A] Exudate Amount: [1:Medium] [2:Medium] [N/A:N/A] Exudate Type:  [1:Serosanguineous] [2:Serosanguineous] [N/A:N/A] Exudate Color: [1:red, brown] [2:red, brown] [N/A:N/A] Wound Margin: [1:Distinct, outline attached] [2:Distinct, outline attached] [N/A:N/A] Granulation Amount: [1:Small (1-33%)] [2:Large (67-100%)] [N/A:N/A] Granulation Quality: N/A Pink, Pale N/A Necrotic Amount: Large (67-100%) None Present (0%) N/A Exposed Structures: Fascia: No Fascia: No N/A Fat: No Fat: No Tendon: No Tendon: No Muscle: No Muscle: No Joint: No Joint: No Bone: No Bone: No Limited to Skin Limited to Skin Breakdown Breakdown Epithelialization: None Small (1-33%) N/A Periwound Skin Texture: Edema: Yes Edema: Yes N/A Excoriation: No Excoriation: No Induration: No Induration: No Callus: No Callus: No Crepitus: No Crepitus: No Fluctuance: No Fluctuance: No Friable: No Friable: No Rash: No Rash: No Scarring: No Scarring: No Periwound Skin Moist: Yes Moist: Yes N/A Moisture: Maceration: No Maceration: No Dry/Scaly: No Dry/Scaly: No Periwound Skin Color: Atrophie Blanche: No Atrophie Blanche: No N/A Cyanosis: No Cyanosis: No Ecchymosis: No Ecchymosis: No Erythema: No Erythema: No Hemosiderin Staining: No Hemosiderin Staining: No Mottled: No Mottled: No Pallor: No Pallor: No Rubor: No Rubor: No Temperature: No Abnormality No Abnormality N/A Tenderness on Yes Yes N/A Palpation: Wound Preparation: Ulcer Cleansing: Ulcer Cleansing: Other: N/A Rinsed/Irrigated with water and soap Saline Topical Anesthetic Topical Anesthetic Applied: Other: lidociane Applied: Other: lidocaine 4% 4% Treatment Notes Electronic Signature(s) Signed: 12/20/2015 4:20:11 PM By: Regan Lemming BSN, RN Entered By: Regan Lemming on 12/20/2015 10:12:09 Effinger, Deloria Carlean Jews ([id number]) -------------------------------------------------------------------------------- Multi-Disciplinary Care Plan Details Patient Name: Jupin, Allisha L. Date of Service: 12/20/2015 10:00  AM Medical Record Patient Account Number: [id number] Number: Treating RN: Baruch Gouty, RN, BSN, Velva Harman [birth year] [phone] y.o. Other Clinician: Date of Birth/Sex: Female) Treating ROBSON, MICHAEL Primary Care Physician: Margarita Rana Physician/Extender: G Referring Physician: [name] in Treatment: 2 Active Inactive Orientation to the Wound Care Program Nursing Diagnoses: Knowledge deficit related to the wound healing center program Goals: Patient/caregiver will verbalize understanding of the [facility] Date Initiated: 12/06/2015 Goal Status: Active Interventions: Provide education on orientation to the wound center Notes: Venous Leg Ulcer Nursing Diagnoses: Knowledge deficit related to disease process and management Potential for venous Insuffiency (use before diagnosis confirmed) Goals: Non-invasive venous studies are completed as ordered Date Initiated: 12/06/2015 Goal Status: Active Patient will maintain optimal edema control Date Initiated: 12/06/2015 Goal Status: Active Patient/caregiver will verbalize understanding of disease process and disease management Date Initiated: 12/06/2015 Goal Status: Active Verify adequate tissue perfusion prior to therapeutic compression application Date Initiated: 12/06/2015 Goal Status: Active Ryce, Sarely L. ([id number]) Interventions: Assess peripheral edema status every visit. Compression as ordered Provide education on venous insufficiency  Treatment Activities: Non-invasive vascular studies : 12/13/2015 Therapeutic compression applied : 12/13/2015 Notes: Wound/Skin Impairment Nursing Diagnoses: Impaired tissue integrity Knowledge deficit related to smoking impact on wound healing Knowledge deficit related to ulceration/compromised skin integrity Goals: Patient/caregiver will verbalize understanding of skin care regimen Date Initiated: 12/06/2015 Goal Status: Active Ulcer/skin breakdown will have a  volume reduction of 30% by week 4 Date Initiated: 12/06/2015 Goal Status: Active Ulcer/skin breakdown will have a volume reduction of 50% by week 8 Date Initiated: 12/06/2015 Goal Status: Active Ulcer/skin breakdown will have a volume reduction of 80% by week 12 Date Initiated: 12/06/2015 Goal Status: Active Ulcer/skin breakdown will heal within 14 weeks Date Initiated: 12/06/2015 Goal Status: Active Interventions: Assess patient/caregiver ability to perform ulcer/skin care regimen upon admission and as needed Assess ulceration(s) every visit Provide education on ulcer and skin care Treatment Activities: Referred to DME Shayden Gingrich for dressing supplies : 12/13/2015 Skin care regimen initiated : 12/13/2015 Topical wound management initiated : 12/13/2015 ARAYA, NORRED ([id number]) Notes: Electronic Signature(s) Signed: 12/20/2015 4:20:11 PM By: Regan Lemming BSN, RN Entered By: Regan Lemming on 12/20/2015 10:11:57 Kops, Angi L. ([id number]) -------------------------------------------------------------------------------- Pain Assessment Details Patient Name: Goodlin, Jashiya L. Date of Service: 12/20/2015 10:00 AM Medical Record Patient Account Number: [id number] Number: Treating RN: Baruch Gouty, RN, BSN, Velva Harman 06/02/1941 [phone] y.o. Other Clinician: Date of Birth/Sex: Female) Treating ROBSON, MICHAEL Primary Care Physician: Margarita Rana Physician/Extender: G Referring Physician: [name] in Treatment: 2 Active Problems Location of Pain Severity and Description of Pain Patient Has Paino No Site Locations Pain Management and Medication Current Pain Management: Electronic Signature(s) Signed: 12/20/2015 4:20:11 PM By: Regan Lemming BSN, RN Entered By: Regan Lemming on 12/20/2015 09:54:50 Mustard, Yarelin L. ([id number]) -------------------------------------------------------------------------------- Patient/Caregiver Education Details Patient Name: Manthe, Tannia L. Date of Service:  12/20/2015 10:00 AM Medical Record Patient Account Number: [id number] Number: Treating RN: Baruch Gouty, RN, BSN, Velva Harman May 31, 1941 [phone] y.o. Other Clinician: Date of Birth/Gender: Female) Treating ROBSON, MICHAEL Primary Care Physician: Margarita Rana Physician/Extender: G Referring Physician: [name] in Treatment: 2 Education Assessment Education Provided To: Patient Education Topics Provided Venous: Methods: Explain/Verbal Responses: State content correctly Welcome To [facility]: Methods: Explain/Verbal Responses: State content correctly Wound/Skin Impairment: Methods: Explain/Verbal Responses: State content correctly Electronic Signature(s) Signed: 12/20/2015 10:30:01 AM By: Regan Lemming BSN, RN Entered By: Regan Lemming on 12/20/2015 10:30:00 Grout, Mindi L. ([id number]) -------------------------------------------------------------------------------- Wound Assessment Details Patient Name: Lamantia, Arrianna L. Date of Service: 12/20/2015 10:00 AM Medical Record Patient Account Number: [id number] Number: Treating RN: Baruch Gouty, RN, BSN, Velva Harman 08-08-1941 [phone] y.o. Other Clinician: Date of Birth/Sex: Female) Treating ROBSON, Gilbert Primary Care Physician: Margarita Rana Physician/Extender: G Referring Physician: [name] in Treatment: 2 Wound Status Wound Number: 1 Primary Skin Tear Etiology: Wound Location: Right Lower Leg - Lateral Wound Open Wounding Event: Trauma Status: Date Acquired: 10/11/2015 Comorbid Cataracts, Chronic Obstructive Weeks Of Treatment: 2 History: Pulmonary Disease (COPD), Clustered Wound: No Hypertension, Osteoarthritis, Neuropathy, Confinement Anxiety Photos Photo Uploaded By: Regan Lemming on 12/20/2015 15:36:59 Wound Measurements Length: (cm) 1.5 Width: (cm) 1.4 Depth: (cm) 0.3 Area: (cm) 1.649 Volume: (cm) 0.495 % Reduction in Area: 47.5% % Reduction in Volume: -57.6% Epithelialization:  None Tunneling: No Undermining: No Wound Description Full Thickness Without Exposed Classification: Support Structures Wound Margin: Distinct, outline attached Exudate Medium Amount: Exudate Type: Serosanguineous Exudate Color: red, brown Savas, Burnetta L. ([id number]) Foul Odor After Cleansing: No Wound Bed Granulation Amount: Small (1-33%) Exposed Structure Necrotic Amount: Large (67-100%) Fascia  Exposed: No Necrotic Quality: Adherent Slough Fat Layer Exposed: No Tendon Exposed: No Muscle Exposed: No Joint Exposed: No Bone Exposed: No Limited to Skin Breakdown Periwound Skin Texture Texture Color No Abnormalities Noted: No No Abnormalities Noted: No Callus: No Atrophie Blanche: No Crepitus: No Cyanosis: No Excoriation: No Ecchymosis: No Fluctuance: No Erythema: No Friable: No Hemosiderin Staining: No Induration: No Mottled: No Localized Edema: Yes Pallor: No Rash: No Rubor: No Scarring: No Temperature / Pain Moisture Temperature: No Abnormality No Abnormalities Noted: No Tenderness on Palpation: Yes Dry / Scaly: No Maceration: No Moist: Yes Wound Preparation Ulcer Cleansing: Rinsed/Irrigated with Saline Topical Anesthetic Applied: Other: lidocaine 4%, Treatment Notes Wound #1 (Right, Lateral Lower Leg) 1. Cleansed with: Cleanse wound with antibacterial soap and water 3. Peri-wound Care: Moisturizing lotion 4. Dressing Applied: Aquacel Ag 5. Secondary Dressing Applied Gauze and Kerlix/Conform 7. Secured with Tape Electronic Signature(s) AYONNA, RUTHERFORD ([id number]) Signed: 12/20/2015 4:20:11 PM By: Regan Lemming BSN, RN Entered By: Regan Lemming on 12/20/2015 10:11:26 Gorka, Amorette L. ([id number]) -------------------------------------------------------------------------------- Wound Assessment Details Patient Name: Martinez, Janeene L. Date of Service: 12/20/2015 10:00 AM Medical Record Patient Account Number: [id number] Number: Treating RN:  Baruch Gouty, RN, BSN, Velva Harman [birth year] [phone] y.o. Other Clinician: Date of Birth/Sex: Female) Treating ROBSON, MICHAEL Primary Care Physician: Margarita Rana Physician/Extender: G Referring Physician: [name] in Treatment: 2 Wound Status Wound Number: 2 Primary Trauma, Other Etiology: Wound Location: Right Lower Leg - Distal Wound Open Wounding Event: Blister Status: Date Acquired: 12/13/2015 Comorbid Cataracts, Chronic Obstructive Weeks Of Treatment: 1 History: Pulmonary Disease (COPD), Clustered Wound: No Hypertension, Osteoarthritis, Neuropathy, Confinement Anxiety Photos Photo Uploaded By: Regan Lemming on 12/20/2015 15:36:59 Wound Measurements Length: (cm) 2 Width: (cm) 3 Depth: (cm) 0.1 Area: (cm) 4.712 Volume: (cm) 0.471 % Reduction in Area: -328.4% % Reduction in Volume: -328.2% Epithelialization: Small (1-33%) Tunneling: No Undermining: No Wound Description Classification: Foul Odor Aft Berkowitz, Lyndsi L. ([id number]) er Cleansing: No Full Thickness Without Exposed Support Structures Wound Margin: Distinct, outline attached Exudate Medium Amount: Exudate Type: Serosanguineous Exudate Color: red, brown Wound Bed Granulation Amount: Large (67-100%) Exposed Structure Granulation Quality: Pink, Pale Fascia Exposed: No Necrotic Amount: None Present (0%) Fat Layer Exposed: No Tendon Exposed: No Muscle Exposed: No Joint Exposed: No Bone Exposed: No Limited to Skin Breakdown Periwound Skin Texture Texture Color No Abnormalities Noted: No No Abnormalities Noted: No Callus: No Atrophie Blanche: No Crepitus: No Cyanosis: No Excoriation: No Ecchymosis: No Fluctuance: No Erythema: No Friable: No Hemosiderin Staining: No Induration: No Mottled: No Localized Edema: Yes Pallor: No Rash: No Rubor: No Scarring: No Temperature / Pain Moisture Temperature: No Abnormality No Abnormalities Noted: No Tenderness on Palpation: Yes Dry / Scaly:  No Maceration: No Moist: Yes Wound Preparation Ulcer Cleansing: Other: water and soap, Topical Anesthetic Applied: Other: lidociane 4%, Treatment Notes Wound #2 (Right, Distal Lower Leg) 1. Cleansed with: Cleanse wound with antibacterial soap and water 3. Peri-wound Care: Moisturizing lotion Vanvleck, Max L. ([id number]) 4. Dressing Applied: Aquacel Ag 5. Secondary Dressing Applied Gauze and Kerlix/Conform 7. Secured with Recruitment consultant) Signed: 12/20/2015 4:20:11 PM By: Regan Lemming BSN, RN Entered By: Regan Lemming on 12/20/2015 10:11:41 Troung, Emmajo L. ([id number]) -------------------------------------------------------------------------------- Vitals Details Patient Name: Sleeth, Aprile L. Date of Service: 12/20/2015 10:00 AM Medical Record Patient Account Number: [id number] Number: Treating RN: Baruch Gouty, RN, BSN, Velva Harman 08-Sep-1940 [phone] y.o. Other Clinician: Date of Birth/Sex: Female) Treating ROBSON, MICHAEL Primary Care Physician: Margarita Rana Physician/Extender: G Referring Physician: Margarita Rana  Weeks in Treatment: 2 Vital Signs Time Taken: 09:54 Temperature (F): 97.5 Height (in): 65 Pulse (bpm): 68 Weight (lbs): 134 Respiratory Rate (breaths/min): 18 Body Mass Index (BMI): 22.3 Blood Pressure (mmHg): 133/49 Reference Range: 80 - 120 mg / dl Electronic Signature(s) Signed: 12/20/2015 4:20:11 PM By: Regan Lemming BSN, RN Entered By: Regan Lemming on 12/20/2015 09:57:24

## 2015-12-21 NOTE — Progress Notes (Signed)
[name] [name], [name] Visit Report for 12/20/2015 Chief Complaint Document Details Patient Name: [name] [name], [name] Date of Service: 12/20/2015 10:00 AM Medical Record Patient Account Number: [id number] Number: Treating RN: Baruch Gouty, RN, BSN, Velva Harman 06/07/1941 [phone] y.o. Other Clinician: Date of Birth/Sex: Female) Treating Saidi Santacroce Primary Care Physician/Extender: Lonn Georgia Physician: Referring Physician: [name] in Treatment: 2 Information Obtained from: Patient Chief Complaint Patient is here for a chronic wound on her right anterior lateral leg Electronic Signature(s) Signed: 12/21/2015 7:59:30 AM By: Linton Ham MD Entered By: Linton Ham on 12/20/2015 10:20:49 [name] [name], [place]. ([id number]) -------------------------------------------------------------------------------- HPI Details Patient Name: [name] [name], [name] Date of Service: 12/20/2015 10:00 AM Medical Record Patient Account Number: [id number] Number: Treating RN: Baruch Gouty, RN, BSN, Velva Harman 1941-08-13 [phone] y.o. Other Clinician: Date of Birth/Sex: Female) Treating Erynne Kealey Primary Care Physician/Extender: Lonn Georgia Physician: Referring Physician: [name] in Treatment: 2 History of Present Illness HPI Description: 12/06/15; this is a patient who has no prior wound history and is not a diabetic. In February she was getting into her family truck and the wind pushed the door against the outer aspect of her right lower leg. I believe there was initially some swelling but not clearly a hematoma. 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 Has been using topical antibiotics on this. 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 There is already been some improvement. She has no prior history of PAD and no major history of venous insufficiency. 12/13/15; wound appears healthy, using prisma 12/20/15; patient arrives complaining of  increasing pain around the wound. She also had a blister under the wound. Electronic Signature(s) Signed: 12/21/2015 7:59:30 AM By: Linton Ham MD Entered By: Linton Ham on 12/20/2015 10:22:21 [name] [name], [name] -------------------------------------------------------------------------------- Physical Exam Details Patient Name: [name] [name], [name] Date of Service: 12/20/2015 10:00 AM Medical Record Patient Account Number: [id number] Number: Treating RN: Baruch Gouty, RN, BSN, Velva Harman Oct 17, 1940 [phone] y.o. Other Clinician: Date of Birth/Sex: Female) Treating Ceili Boshers Primary Care Physician/Extender: Lonn Georgia Physician: Referring Physician: [name] in Treatment: 2 Constitutional Sitting or standing Blood Pressure is within target range for patient.. Pulse regular and within target range for patient.Marland Kitchen Respirations regular, non-labored and within target range.. Temperature is normal and within the target range for the patient.. Patient's appearance is neat and clean. Appears in no acute distress. Well nourished and well developed.. Eyes Conjunctivae clear. No discharge. No scleral icterus. Cardiovascular Femoral arteries without bruits and pulses strong.. Pedal pulses palpable and strong bilaterally.. Edema edema is controlled. Lymphatic None palpable in the popliteal or inguinal areas. Psychiatric No evidence of depression, anxiety, or agitation. Calm, cooperative, and communicative. Appropriate interactions and affect.. Notes Wound exam; no debridement is required. There is less surface slough visible. She has a blister that was excised underneath the wound tenderness spreading anteriorly compatible I think with cellulitis Electronic Signature(s) Signed: 12/21/2015 7:59:30 AM By: Linton Ham MD Entered By: Linton Ham on 12/20/2015 10:23:53 [name] [name], [name]  ([id number]) -------------------------------------------------------------------------------- Physician Orders Details Patient Name: [name] [name], [name] Date of Service: 12/20/2015 10:00 AM Medical Record Patient Account Number: [id number] Number: Treating RN: Baruch Gouty, RN, BSN, Velva Harman 1940/10/22 [phone] y.o. Other Clinician: Date of Birth/Sex: Female) Treating Sabrine Patchen Primary Care Physician/Extender: Lonn Georgia Physician: Referring Physician: [name] in Treatment: 2 Verbal / Phone Orders: Yes Clinician: Afful, RN, BSN, Rita Read Back and Verified: Yes Diagnosis Coding Wound  Cleansing Wound #1 Right,Lateral Lower Leg o Cleanse wound with mild soap and water o May Shower, gently pat wound dry prior to applying new dressing. o May shower with protection. Wound #2 Right,Distal Lower Leg o Cleanse wound with mild soap and water o May Shower, gently pat wound dry prior to applying new dressing. o May shower with protection. Anesthetic Wound #1 Right,Lateral Lower Leg o Topical Lidocaine 4% cream applied to wound bed prior to debridement Wound #2 Right,Distal Lower Leg o Topical Lidocaine 4% cream applied to wound bed prior to debridement Skin Barriers/Peri-Wound Care Wound #1 Right,Lateral Lower Leg o Barrier cream Wound #2 Right,Distal Lower Leg o Barrier cream Primary Wound Dressing Wound #1 Right,Lateral Lower Leg o Aquacel Ag Wound #2 Right,Distal Lower Leg o Aquacel Ag Secondary Dressing [name] [name], [name] Wound #1 Right,Lateral Lower Leg o Gauze and Kerlix/Conform Wound #2 Right,Distal Lower Leg o Gauze and Kerlix/Conform Dressing Change Frequency Wound #1 Right,Lateral Lower Leg o Change dressing every day. Wound #2 Right,Distal Lower Leg o Change dressing every day. Follow-up Appointments Wound #1 Right,Lateral Lower Leg o Return Appointment in 1 week. Wound #2 Right,Distal Lower Leg o  Return Appointment in 1 week. Additional Orders / Instructions Wound #1 Right,Lateral Lower Leg o Increase protein intake. o Activity as tolerated Wound #2 Right,Distal Lower Leg o Increase protein intake. o Activity as tolerated Medications-please add to medication list. Wound #1 Right,Lateral Lower Leg o P.O. Antibiotics - Doxy 100mg  bid x 7 days Wound #2 Right,Distal Lower Leg o P.O. Antibiotics - Doxy 100mg  bid x 7 days Electronic Signature(s) Signed: 12/20/2015 4:20:11 PM By: Regan Lemming BSN, RN Signed: 12/21/2015 7:59:30 AM By: Linton Ham MD Entered By: Regan Lemming on 12/20/2015 10:14:32 [name] [name], [name] -------------------------------------------------------------------------------- Problem List Details Patient Name: [name] [name], [name] Date of Service: 12/20/2015 10:00 AM Medical Record Patient Account Number: [id number] Number: Treating RN: Baruch Gouty, RN, BSN, Velva Harman 23-Jan-1941 [phone] y.o. 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 -------------------------------------------------------------------------------- Progress Note Details Patient Name: [name] [name], [name] Date of Service: 12/20/2015 10:00 AM Medical Record Patient Account Number: [id number] Number: Treating RN: Baruch Gouty, RN, BSN, Velva Harman 1941/04/19 [phone] y.o. Other Clinician: Date of Birth/Sex: Female) Treating Halvor Behrend,  Haytham Maher Primary Care Physician/Extender: Lonn Georgia Physician: Referring Physician: [name] in Treatment: 2 Subjective Chief Complaint Information obtained from Patient Patient is here for a chronic wound on her right anterior lateral leg History of Present Illness (HPI) 12/06/15; this is a patient who has no prior wound history and is not a diabetic. In February she was getting into her family truck and the wind pushed the door against the outer aspect of her right lower leg. I believe there was initially some swelling but not clearly a hematoma. 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 Has been using topical antibiotics on this. 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 There is already been some improvement. She has no prior history of PAD and no major history of venous insufficiency. 12/13/15; wound appears healthy, using prisma  12/20/15; patient arrives complaining of increasing pain around the wound. She also had a blister under the wound. Objective Constitutional Sitting or standing Blood Pressure is within target range for patient.. Pulse regular and within target range for patient.Marland Kitchen Respirations regular, non-labored and within target range.. Temperature is normal and within the target range for the patient.. Patient's appearance is neat and clean. Appears in no acute distress. Well nourished and well developed.. Vitals Time Taken: 9:54 AM, Height: 65 in, Weight: 134 lbs, BMI: 22.3, Temperature: 97.5 F, Pulse: 68 bpm, Respiratory Rate: 18 breaths/min, Blood Pressure: 133/49 mmHg. Eyes Conjunctivae clear. No discharge. No scleral icterus. Obeso, Edda L. ([id number]) Cardiovascular Femoral arteries without bruits and pulses strong.. Pedal pulses palpable and strong bilaterally.. Edema edema is controlled. Lymphatic None palpable in the popliteal or inguinal areas. Psychiatric No evidence of depression, anxiety, or  agitation. Calm, cooperative, and communicative. Appropriate interactions and affect.. General Notes: Wound exam; no debridement is required. There is less surface slough visible. She has a blister that was excised underneath the wound tenderness spreading anteriorly compatible I think with cellulitis Integumentary (Hair, Skin) Wound #1 status is Open. Original cause of wound was Trauma. The wound is located on the Right,Lateral Lower Leg. The wound measures 1.5cm length x 1.4cm width x 0.3cm depth; 1.649cm^2 area and 0.495cm^3 volume. The wound is limited to skin breakdown. There is no tunneling or undermining noted. There is a medium amount of serosanguineous drainage noted. The wound margin is distinct with the outline attached to the wound base. There is small (1-33%) granulation within the wound bed. There is a large (67-100%) amount of necrotic tissue within the wound bed including Adherent Slough. The periwound skin appearance exhibited: Localized Edema, Moist. The periwound skin appearance did not exhibit: Callus, Crepitus, Excoriation, Fluctuance, Friable, Induration, Rash, Scarring, Dry/Scaly, Maceration, Atrophie Blanche, Cyanosis, Ecchymosis, Hemosiderin Staining, Mottled, Pallor, Rubor, Erythema. Periwound temperature was noted as No Abnormality. The periwound has tenderness on palpation. Wound #2 status is Open. Original cause of wound was Blister. The wound is located on the Right,Distal Lower Leg. The wound measures 2cm length x 3cm width x 0.1cm depth; 4.712cm^2 area and 0.471cm^3 volume. The wound is limited to skin breakdown. There is no tunneling or undermining noted. There is a medium amount of serosanguineous drainage noted. The wound margin is distinct with the outline attached to the wound base. There is large (67-100%) pink, pale granulation within the wound bed. There is no necrotic tissue within the wound bed. The periwound skin appearance exhibited: Localized Edema,  Moist. The periwound skin appearance did not exhibit: Callus, Crepitus, Excoriation, Fluctuance, Friable, Induration, Rash, Scarring, Dry/Scaly, Maceration, Atrophie Blanche, Cyanosis, Ecchymosis, Hemosiderin Staining, Mottled, Pallor, Rubor, Erythema. Periwound temperature was noted as No Abnormality. The periwound has tenderness on palpation. 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 ([id number]) Plan Wound Cleansing: Wound #1 Right,Lateral Lower Leg: Cleanse wound with mild soap and water May Shower, gently pat wound dry prior to applying new dressing. May shower with protection. Wound #2 Right,Distal Lower Leg: Cleanse wound with mild soap and water May Shower, gently pat wound dry prior to applying new dressing. May shower with protection. Anesthetic: Wound #1 Right,Lateral Lower Leg: Topical Lidocaine 4% cream applied to wound bed prior to debridement Wound #2 Right,Distal Lower Leg: Topical Lidocaine 4% cream applied to wound bed prior to debridement Skin Barriers/Peri-Wound Care: Wound #1 Right,Lateral Lower  Leg: Barrier cream Wound #2 Right,Distal Lower Leg: Barrier cream Primary Wound Dressing: Wound #1 Right,Lateral Lower Leg: Aquacel Ag Wound #2 Right,Distal Lower Leg: Aquacel Ag Secondary Dressing: Wound #1 Right,Lateral Lower Leg: Gauze and Kerlix/Conform Wound #2 Right,Distal Lower Leg: Gauze and Kerlix/Conform Dressing Change Frequency: Wound #1 Right,Lateral Lower Leg: Change dressing every day. Wound #2 Right,Distal Lower Leg: Change dressing every day. Follow-up Appointments: Wound #1 Right,Lateral Lower Leg: Return Appointment in 1 week. Wound #2 Right,Distal Lower Leg: Return Appointment in 1 week. Additional Orders / Instructions: Wound #1 Right,Lateral Lower Leg: Increase protein intake. Haile, Amel L.  ([id number]) Activity as tolerated Wound #2 Right,Distal Lower Leg: Increase protein intake. Activity as tolerated Medications-please add to medication list.: Wound #1 Right,Lateral Lower Leg: P.O. Antibiotics - Doxy 100mg  bid x 7 days Wound #2 Right,Distal Lower Leg: P.O. Antibiotics - Doxy 100mg  bid x 7 days #1 we applied Aquacel Ag to this area. #2 the excised blister left a healthy appearing surface #3 we did not wrap her leg today so she can keep an eye on this use Kerlix and conform #4. Doxycycline Electronic Signature(s) Signed: 12/21/2015 7:59:30 AM By: Linton Ham MD Entered By: Linton Ham on 12/20/2015 10:26:38 Pulis, [name] [name] ([id number]) -------------------------------------------------------------------------------- SuperBill Details Patient Name: Kaeding, Kyarra L. Date of Service: 12/20/2015 Medical Record Patient Account Number: [id number] Number: Treating RN: Baruch Gouty, RN, BSN, Rita 1941-04-20 [phone] y.o. 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

## 2015-12-26 ENCOUNTER — Encounter: Payer: PPO | Attending: Internal Medicine | Admitting: Internal Medicine

## 2015-12-26 DIAGNOSIS — M1990 Unspecified osteoarthritis, unspecified site: Secondary | ICD-10-CM | POA: Diagnosis not present

## 2015-12-26 DIAGNOSIS — F419 Anxiety disorder, unspecified: Secondary | ICD-10-CM | POA: Insufficient documentation

## 2015-12-26 DIAGNOSIS — Z885 Allergy status to narcotic agent status: Secondary | ICD-10-CM | POA: Insufficient documentation

## 2015-12-26 DIAGNOSIS — I872 Venous insufficiency (chronic) (peripheral): Secondary | ICD-10-CM | POA: Diagnosis not present

## 2015-12-26 DIAGNOSIS — Z9109 Other allergy status, other than to drugs and biological substances: Secondary | ICD-10-CM | POA: Diagnosis not present

## 2015-12-26 DIAGNOSIS — Z87891 Personal history of nicotine dependence: Secondary | ICD-10-CM | POA: Insufficient documentation

## 2015-12-26 DIAGNOSIS — S81811A Laceration without foreign body, right lower leg, initial encounter: Secondary | ICD-10-CM | POA: Diagnosis not present

## 2015-12-26 DIAGNOSIS — I10 Essential (primary) hypertension: Secondary | ICD-10-CM | POA: Insufficient documentation

## 2015-12-26 DIAGNOSIS — Z886 Allergy status to analgesic agent status: Secondary | ICD-10-CM | POA: Insufficient documentation

## 2015-12-26 DIAGNOSIS — L97213 Non-pressure chronic ulcer of right calf with necrosis of muscle: Secondary | ICD-10-CM | POA: Diagnosis not present

## 2015-12-26 DIAGNOSIS — J449 Chronic obstructive pulmonary disease, unspecified: Secondary | ICD-10-CM | POA: Insufficient documentation

## 2015-12-27 ENCOUNTER — Other Ambulatory Visit
Admission: RE | Admit: 2015-12-27 | Discharge: 2015-12-27 | Disposition: A | Discharge status: Home / Self Care | Payer: PPO | Source: Ambulatory Visit | Attending: Internal Medicine | Admitting: Internal Medicine

## 2015-12-27 DIAGNOSIS — S91001A Unspecified open wound, right ankle, initial encounter: Secondary | ICD-10-CM | POA: Diagnosis not present

## 2015-12-27 DIAGNOSIS — M25471 Effusion, right ankle: Secondary | ICD-10-CM | POA: Insufficient documentation

## 2015-12-27 DIAGNOSIS — S81801A Unspecified open wound, right lower leg, initial encounter: Secondary | ICD-10-CM | POA: Diagnosis not present

## 2015-12-27 NOTE — Progress Notes (Signed)
ALPA, SCHEHR ([id number]) Visit Report for 12/26/2015 Arrival Information Details Patient Name: [name] [name], [name] Date of Service: 12/26/2015 2:15 PM Medical Record Patient Account Number: [id number] Number: Treating RN: Baruch Gouty, RN, BSN, Rita 05-04-1941 [phone] y.o. Other Clinician: Date of Birth/Sex: Female) Treating ROBSON, Louise Primary Care Physician: Margarita Rana Physician/Extender: G Referring Physician: [name] in Treatment: 2 Visit Information History Since Last Visit Added or deleted any medications: No Patient Arrived: Ambulatory Any new allergies or adverse reactions: No Arrival Time: 14:32 Had a fall or experienced change in No Accompanied By: self activities of daily living that may affect Transfer Assistance: None risk of falls: Patient Identification Verified: Yes Signs or symptoms of abuse/neglect since last No Secondary Verification Process Yes visito Completed: Hospitalized since last visit: No Patient Requires Transmission-Based No Has Dressing in Place as Prescribed: Yes Precautions: Has Compression in Place as Prescribed: No Patient Has Alerts: No Pain Present Now: No Electronic Signature(s) Signed: 12/26/2015 5:12:30 PM By: Regan Lemming BSN, RN Entered By: Regan Lemming on 12/26/2015 14:33:59 Brandstetter, Aaliya L. ([id number]) -------------------------------------------------------------------------------- Encounter Discharge Information Details Patient Name: [name] [name], [name] Date of Service: 12/26/2015 2:15 PM Medical Record Patient Account Number: [id number] Number: Treating RN: Baruch Gouty, RN, BSN, Rita Jun 25, 1941 [phone] y.o. Other Clinician: Date of Birth/Sex: Female) Treating ROBSON, MICHAEL Primary Care Physician: Margarita Rana Physician/Extender: G Referring Physician: [name] in Treatment: 2 Encounter Discharge Information Items Discharge Pain Level: 0 Discharge Condition: Stable Ambulatory Status:  Ambulatory Discharge Destination: Home Transportation: Private Auto Accompanied By: self Schedule Follow-up Appointment: No Medication Reconciliation completed and provided to Patient/Care No Itzamara Casas: Provided on Clinical Summary of Care: 12/26/2015 Form Type Recipient Paper Patient NB Electronic Signature(s) Signed: 12/26/2015 3:06:45 PM By: Ruthine Dose Entered By: Ruthine Dose on 12/26/2015 15:06:44 Delahunty, Devonne L. ([id number]) -------------------------------------------------------------------------------- Lower Extremity Assessment Details Patient Name: [name] [name], [name] Date of Service: 12/26/2015 2:15 PM Medical Record Patient Account Number: [id number] Number: Treating RN: Baruch Gouty, RN, BSN, Rita 04/03/1941 [phone] y.o. Other Clinician: Date of Birth/Sex: Female) Treating ROBSON, MICHAEL Primary Care Physician: Margarita Rana Physician/Extender: G Referring Physician: [name] in Treatment: 2 Edema Assessment Assessed: [Left: No] [Right: No] E[Left: dema] [Right: :] Calf Left: Right: Point of Measurement: 32 cm From Medial Instep cm 36 cm Ankle Left: Right: Point of Measurement: 7 cm From Medial Instep cm 21 cm Vascular Assessment Claudication: Claudication Assessment [Right:None] Pulses: Posterior Tibial Palpable: [Right:Yes] Dorsalis Pedis Palpable: [Right:Yes] Extremity colors, hair growth, and conditions: Extremity Color: [Right:Mottled] Hair Growth on Extremity: [Right:No] Temperature of Extremity: [Right:Warm] Capillary Refill: [Right:< 3 seconds] Toe Nail Assessment Left: Right: Thick: No Discolored: No Deformed: No Improper Length and Hygiene: No Electronic Signature(s) KRITHIKA, ROMANIELLO ([id number]) Signed: 12/26/2015 5:12:30 PM By: Regan Lemming BSN, RN Entered By: Regan Lemming on 12/26/2015 14:35:32 [name] [name], [name] ([id number]) -------------------------------------------------------------------------------- Multi Wound Chart  Details Patient Name: [name] [name], [name] Date of Service: 12/26/2015 2:15 PM Medical Record Patient Account Number: [id number] Number: Treating RN: Baruch Gouty, RN, BSN, Rita 01/05/1941 [phone] y.o. Other Clinician: Date of Birth/Sex: Female) Treating ROBSON, MICHAEL Primary Care Physician: Margarita Rana Physician/Extender: G Referring Physician: [name] in Treatment: 2 Vital Signs Height(in): 65 Pulse(bpm): 66 Weight(lbs): 134 Blood Pressure 122/50 (mmHg): Body Mass Index(BMI): 22 Temperature(F): 97.5 Respiratory Rate 18 (breaths/min): Photos: [1:No Photos] [2:No Photos] [N/A:N/A] Wound Location: [1:Right Lower Leg - Lateral] [2:Right Lower Leg - Distal] [N/A:N/A] Wounding Event: [1:Trauma] [2:Blister] [N/A:N/A] Primary Etiology: [1:Skin Tear] [2:Trauma, Other] [N/A:N/A] Comorbid History: [  1:Cataracts, Chronic Obstructive Pulmonary Disease (COPD), Hypertension, Osteoarthritis, Neuropathy, Confinement Anxiety] [2:Cataracts, Chronic Obstructive Pulmonary Disease (COPD), Hypertension, Osteoarthritis, Neuropathy, Confinement  Anxiety] [N/A:N/A] Date Acquired: [1:10/11/2015] [2:12/13/2015] [N/A:N/A] Weeks of Treatment: [1:2] [2:1] [N/A:N/A] Wound Status: [1:Open] [2:Open] [N/A:N/A] Measurements L x W x D 1.5x1.3x0.3 [2:2x3x0.1] [N/A:N/A] (cm) Area (cm) : [1:1.532] [2:4.712] [N/A:N/A] Volume (cm) : [1:0.459] [2:0.471] [N/A:N/A] % Reduction in Area: [1:51.20%] [2:-328.40%] [N/A:N/A] % Reduction in Volume: -46.20% [2:-328.20%] [N/A:N/A] Classification: [1:Full Thickness Without Exposed Support Structures] [2:Full Thickness Without Exposed Support Structures] [N/A:N/A] Exudate Amount: [1:Medium] [2:Medium] [N/A:N/A] Exudate Type: [1:Serosanguineous] [2:Serosanguineous] [N/A:N/A] Exudate Color: [1:red, brown] [2:red, brown] [N/A:N/A] Wound Margin: [1:Distinct, outline attached] [2:Distinct, outline attached] [N/A:N/A] Granulation Amount: [1:Small (1-33%)] [2:Large  (67-100%)] [N/A:N/A] Granulation Quality: N/A Pink, Pale N/A Necrotic Amount: Large (67-100%) None Present (0%) N/A Exposed Structures: Fascia: No Fascia: No N/A Fat: No Fat: No Tendon: No Tendon: No Muscle: No Muscle: No Joint: No Joint: No Bone: No Bone: No Limited to Skin Limited to Skin Breakdown Breakdown Epithelialization: None Small (1-33%) N/A Periwound Skin Texture: Edema: Yes Edema: Yes N/A Excoriation: No Excoriation: No Induration: No Induration: No Callus: No Callus: No Crepitus: No Crepitus: No Fluctuance: No Fluctuance: No Friable: No Friable: No Rash: No Rash: No Scarring: No Scarring: No Periwound Skin Moist: Yes Moist: Yes N/A Moisture: Maceration: No Maceration: No Dry/Scaly: No Dry/Scaly: No Periwound Skin Color: Atrophie Blanche: No Atrophie Blanche: No N/A Cyanosis: No Cyanosis: No Ecchymosis: No Ecchymosis: No Erythema: No Erythema: No Hemosiderin Staining: No Hemosiderin Staining: No Mottled: No Mottled: No Pallor: No Pallor: No Rubor: No Rubor: No Temperature: No Abnormality No Abnormality N/A Tenderness on Yes Yes N/A Palpation: Wound Preparation: Ulcer Cleansing: Ulcer Cleansing: Other: N/A Rinsed/Irrigated with water and soap Saline Topical Anesthetic Topical Anesthetic Applied: Other: lidociane Applied: Other: lidocaine 4% 4% Treatment Notes Electronic Signature(s) Signed: 12/26/2015 5:12:30 PM By: Regan Lemming BSN, RN Entered By: Regan Lemming on 12/26/2015 14:43:07 Beauregard, Cariah LMarland Kitchen ([id number]) -------------------------------------------------------------------------------- [place] Details Patient Name: [name] [name], [name] Date of Service: 12/26/2015 2:15 PM Medical Record Patient Account Number: [id number] Number: Treating RN: Baruch Gouty, RN, BSN, Rita 1941-06-03 [phone] y.o. Other Clinician: Date of Birth/Sex: Female) Treating ROBSON, MICHAEL Primary Care Physician: Margarita Rana Physician/Extender: G Referring Physician: [name] in Treatment: 2 Active Inactive Orientation to the Wound Care Program Nursing Diagnoses: Knowledge deficit related to the wound healing center program Goals: Patient/caregiver will verbalize understanding of the [facility] Date Initiated: 12/06/2015 Goal Status: Active Interventions: Provide education on orientation to the wound center Notes: Venous Leg Ulcer Nursing Diagnoses: Knowledge deficit related to disease process and management Potential for venous Insuffiency (use before diagnosis confirmed) Goals: Non-invasive venous studies are completed as ordered Date Initiated: 12/06/2015 Goal Status: Active Patient will maintain optimal edema control Date Initiated: 12/06/2015 Goal Status: Active Patient/caregiver will verbalize understanding of disease process and disease management Date Initiated: 12/06/2015 Goal Status: Active Verify adequate tissue perfusion prior to therapeutic compression application Date Initiated: 12/06/2015 Goal Status: Active [name] [name], [name] ([id number]) Interventions: Assess peripheral edema status every visit. Compression as ordered Provide education on venous insufficiency Treatment Activities: Non-invasive vascular studies : 12/13/2015 Therapeutic compression applied : 12/13/2015 Notes: Wound/Skin Impairment Nursing Diagnoses: Impaired tissue integrity Knowledge deficit related to smoking impact on wound healing Knowledge deficit related to ulceration/compromised skin integrity Goals: Patient/caregiver will verbalize understanding of skin care regimen Date Initiated: 12/06/2015 Goal Status: Active Ulcer/skin breakdown will have a volume reduction of 30% by week 4  Date Initiated: 12/06/2015 Goal Status: Active Ulcer/skin breakdown will have a volume reduction of 50% by week 8 Date Initiated: 12/06/2015 Goal Status: Active Ulcer/skin breakdown will have a  volume reduction of 80% by week 12 Date Initiated: 12/06/2015 Goal Status: Active Ulcer/skin breakdown will heal within 14 weeks Date Initiated: 12/06/2015 Goal Status: Active Interventions: Assess patient/caregiver ability to perform ulcer/skin care regimen upon admission and as needed Assess ulceration(s) every visit Provide education on ulcer and skin care Treatment Activities: Referred to DME Romilda Proby for dressing supplies : 12/13/2015 Skin care regimen initiated : 12/13/2015 Topical wound management initiated : 12/13/2015 JANESHIA, GALAS ([id number]) Notes: Electronic Signature(s) Signed: 12/26/2015 5:12:30 PM By: Regan Lemming BSN, RN Entered By: Regan Lemming on 12/26/2015 14:42:59 [name] [name], [name] ([id number]) -------------------------------------------------------------------------------- Pain Assessment Details Patient Name: [name] [name], [name] Date of Service: 12/26/2015 2:15 PM Medical Record Patient Account Number: [id number] Number: Treating RN: Baruch Gouty, RN, BSN, Rita 12-23-1940 [phone] y.o. Other Clinician: Date of Birth/Sex: Female) Treating ROBSON, MICHAEL Primary Care Physician: Margarita Rana Physician/Extender: G Referring Physician: [name] in Treatment: 2 Active Problems Location of Pain Severity and Description of Pain Patient Has Paino No Site Locations Pain Management and Medication Current Pain Management: Electronic Signature(s) Signed: 12/26/2015 5:12:30 PM By: Regan Lemming BSN, RN Entered By: Regan Lemming on 12/26/2015 14:34:12 [name] [name], [name] ([id number]) -------------------------------------------------------------------------------- Patient/Caregiver Education Details Patient Name: [name] [name], [name] Date of Service: 12/26/2015 2:15 PM Medical Record Patient Account Number: [id number] Number: Treating RN: Baruch Gouty, RN, BSN, Rita [birth year] [phone] y.o. Other Clinician: Date of Birth/Gender: Female) Treating ROBSON, MICHAEL Primary Care  Physician: Margarita Rana Physician/Extender: G Referring Physician: [name] in Treatment: 2 Education Assessment Education Provided To: Patient Education Topics Provided Venous: Methods: Explain/Verbal Responses: State content correctly Welcome To [facility]: Methods: Explain/Verbal Wound/Skin Impairment: Methods: Explain/Verbal Responses: State content correctly Electronic Signature(s) Signed: 12/26/2015 5:12:30 PM By: Regan Lemming BSN, RN Entered By: Regan Lemming on 12/26/2015 15:06:14 [name] [name], [name] ([id number]) -------------------------------------------------------------------------------- Wound Assessment Details Patient Name: [name] [name], [name] Date of Service: 12/26/2015 2:15 PM Medical Record Patient Account Number: [id number] Number: Treating RN: Baruch Gouty, RN, BSN, Rita 1941/04/23 [phone] y.o. Other Clinician: Date of Birth/Sex: Female) Treating ROBSON, [place] Primary Care Physician: Margarita Rana Physician/Extender: G Referring Physician: [name] in Treatment: 2 Wound Status Wound Number: 1 Primary Skin Tear Etiology: Wound Location: Right Lower Leg - Lateral Wound Open Wounding Event: Trauma Status: Date Acquired: 10/11/2015 Comorbid Cataracts, Chronic Obstructive Weeks Of Treatment: 2 History: Pulmonary Disease (COPD), Clustered Wound: No Hypertension, Osteoarthritis, Neuropathy, Confinement Anxiety Photos Photo Uploaded By: Regan Lemming on 12/26/2015 16:35:39 Wound Measurements Length: (cm) 1.5 Width: (cm) 1.3 Depth: (cm) 0.3 Area: (cm) 1.532 Volume: (cm) 0.459 % Reduction in Area: 51.2% % Reduction in Volume: -46.2% Epithelialization: None Tunneling: No Undermining: No Wound Description Classification: Bibbee, Daylani L. ([id number]) Foul Odor After Cleansing: No Full Thickness Without Exposed Support Structures Wound Margin: Distinct, outline attached Exudate Large Amount: Exudate Type:  Serosanguineous Exudate Color: red, brown Wound Bed Granulation Amount: Small (1-33%) Exposed Structure Necrotic Amount: Large (67-100%) Fascia Exposed: No Necrotic Quality: Adherent Slough Fat Layer Exposed: No Tendon Exposed: No Muscle Exposed: No Joint Exposed: No Bone Exposed: No Limited to Skin Breakdown Periwound Skin Texture Texture Color No Abnormalities Noted: No No Abnormalities Noted: No Callus: No Atrophie Blanche: No Crepitus: No Cyanosis: No Excoriation: No Ecchymosis: No Fluctuance: No Erythema: No Friable: No Hemosiderin Staining: No Induration: No Mottled: No Localized Edema:  Yes Pallor: No Rash: No Rubor: No Scarring: No Temperature / Pain Moisture Temperature: No Abnormality No Abnormalities Noted: No Tenderness on Palpation: Yes Dry / Scaly: No Maceration: No Moist: Yes Wound Preparation Ulcer Cleansing: Rinsed/Irrigated with Saline Topical Anesthetic Applied: Other: lidocaine 4%, Treatment Notes Wound #1 (Right, Lateral Lower Leg) 1. Cleansed with: Cleanse wound with antibacterial soap and water 3. Peri-wound Care: Moisturizing lotion Reitter, Colbi L. ([id number]) 4. Dressing Applied: Aquacel Ag 5. Secondary Dressing Applied Gauze and Kerlix/Conform 7. Secured with Recruitment consultant) Signed: 12/26/2015 5:12:30 PM By: Regan Lemming BSN, RN Entered By: Regan Lemming on 12/26/2015 15:06:36 Havrilla, Rondalyn L. ([id number]) -------------------------------------------------------------------------------- Wound Assessment Details Patient Name: Dry, Ramla L. Date of Service: 12/26/2015 2:15 PM Medical Record Patient Account Number: [id number] Number: Treating RN: Baruch Gouty, RN, BSN, Rita [birth year] [phone] y.o. Other Clinician: Date of Birth/Sex: Female) Treating ROBSON, MICHAEL Primary Care Physician: Margarita Rana Physician/Extender: G Referring Physician: [name] in Treatment: 2 Wound Status Wound Number: 2  Primary Trauma, Other Etiology: Wound Location: Right Lower Leg - Distal Wound Open Wounding Event: Blister Status: Date Acquired: 12/13/2015 Comorbid Cataracts, Chronic Obstructive Weeks Of Treatment: 1 History: Pulmonary Disease (COPD), Clustered Wound: No Hypertension, Osteoarthritis, Neuropathy, Confinement Anxiety Photos Photo Uploaded By: Regan Lemming on 12/26/2015 16:35:40 Wound Measurements Length: (cm) 2 Width: (cm) 3 Depth: (cm) 0.1 Area: (cm) 4.712 Volume: (cm) 0.471 % Reduction in Area: -328.4% % Reduction in Volume: -328.2% Epithelialization: Small (1-33%) Tunneling: No Undermining: No Wound Description Classification: Foul Odor Aft Brickman, Yanitza L. ([id number]) er Cleansing: No Full Thickness Without Exposed Support Structures Wound Margin: Distinct, outline attached Exudate Large Amount: Exudate Type: Serosanguineous Exudate Color: red, brown Wound Bed Granulation Amount: Large (67-100%) Exposed Structure Granulation Quality: Pink, Pale Fascia Exposed: No Necrotic Amount: None Present (0%) Fat Layer Exposed: No Tendon Exposed: No Muscle Exposed: No Joint Exposed: No Bone Exposed: No Limited to Skin Breakdown Periwound Skin Texture Texture Color No Abnormalities Noted: No No Abnormalities Noted: No Callus: No Atrophie Blanche: No Crepitus: No Cyanosis: No Excoriation: No Ecchymosis: No Fluctuance: No Erythema: No Friable: No Hemosiderin Staining: No Induration: No Mottled: No Localized Edema: Yes Pallor: No Rash: No Rubor: No Scarring: No Temperature / Pain Moisture Temperature: No Abnormality No Abnormalities Noted: No Tenderness on Palpation: Yes Dry / Scaly: No Maceration: No Moist: Yes Wound Preparation Ulcer Cleansing: Other: water and soap, Topical Anesthetic Applied: Other: lidociane 4%, Treatment Notes Wound #2 (Right, Distal Lower Leg) 1. Cleansed with: Cleanse wound with antibacterial soap and water 3.  Peri-wound Care: Moisturizing lotion Eddleman, Valeri L. ([id number]) 4. Dressing Applied: Aquacel Ag 5. Secondary Dressing Applied Gauze and Kerlix/Conform 7. Secured with Recruitment consultant) Signed: 12/26/2015 5:12:30 PM By: Regan Lemming BSN, RN Entered By: Regan Lemming on 12/26/2015 15:06:50 Ihrig, Dreama L. ([id number]) -------------------------------------------------------------------------------- Vitals Details Patient Name: Stafford, Nicol L. Date of Service: 12/26/2015 2:15 PM Medical Record Patient Account Number: [id number] Number: Treating RN: Baruch Gouty, RN, BSN, Rita May 08, 1941 [phone] y.o. Other Clinician: Date of Birth/Sex: Female) Treating ROBSON, MICHAEL Primary Care Physician: Margarita Rana Physician/Extender: G Referring Physician: [name] in Treatment: 2 Vital Signs Time Taken: 14:35 Temperature (F): 97.5 Height (in): 65 Pulse (bpm): 66 Weight (lbs): 134 Respiratory Rate (breaths/min): 18 Body Mass Index (BMI): 22.3 Blood Pressure (mmHg): 122/50 Reference Range: 80 - 120 mg / dl Electronic Signature(s) Signed: 12/26/2015 5:12:30 PM By: Regan Lemming BSN, RN Entered By: Regan Lemming on 12/26/2015 14:35:55

## 2015-12-27 NOTE — Progress Notes (Signed)
[name] [name] ([id number]) Visit Report for 12/26/2015 Chief Complaint Document Details Patient Name: [name] [name], [name] Date of Service: 12/26/2015 2:15 PM Medical Record Patient Account Number: [id number] Number: Treating RN: Baruch Gouty, RN, BSN, Rita 13-Sep-1940 [phone] y.o. Other Clinician: Date of Birth/Sex: Female) Treating Shawnique Mariotti Primary Care Physician/Extender: Lonn Georgia Physician: Referring Physician: [name] in Treatment: 2 Information Obtained from: Patient Chief Complaint Patient is here for a chronic wound on her right anterior lateral leg Electronic Signature(s) Signed: 12/27/2015 8:00:31 AM By: Linton Ham MD Entered By: Linton Ham on 12/26/2015 15:05:02 [name], [name] ([id number]) -------------------------------------------------------------------------------- Debridement Details Patient Name: [name], [name] Date of Service: 12/26/2015 2:15 PM Medical Record Patient Account Number: [id number] Number: Treating RN: Baruch Gouty, RN, BSN, Rita 25-Apr-1941 [phone] y.o. Other Clinician: Date of Birth/Sex: Female) Treating Yaziel Brandon Primary Care Physician/Extender: Lonn Georgia Physician: Referring Physician: [name] in Treatment: 2 Debridement Performed for Wound #1 Right,Lateral Lower Leg Assessment: Performed By: Physician Ricard Dillon, MD Debridement: Debridement Pre-procedure Yes Verification/Time Out Taken: Start Time: 14:52 Pain Control: Lidocaine 4% Topical Solution Level: Skin/Subcutaneous Tissue Total Area Debrided ([name] x 1.5 (cm) x 1.3 (cm) = 1.95 (cm) W): Tissue and other Non-Viable, Exudate, Fibrin/Slough, Subcutaneous material debrided: Instrument: Curette Bleeding: Minimum Hemostasis Achieved: Pressure End Time: 14:55 Procedural Pain: 0 Post Procedural Pain: 0 Response to Treatment: Procedure was tolerated well Post Debridement Measurements of Total Wound Length: (cm) 1.5 Width:  (cm) 1.3 Depth: (cm) 0.3 Volume: (cm) 0.459 Post Procedure Diagnosis Same as Pre-procedure Electronic Signature(s) Signed: 12/26/2015 5:12:30 PM By: Regan Lemming BSN, RN Signed: 12/27/2015 8:00:31 AM By: Linton Ham MD Entered By: Linton Ham on 12/26/2015 15:04:24 [name], [name] ([id number]) [name], [name] ([id number]) -------------------------------------------------------------------------------- Debridement Details Patient Name: [name], [name] Date of Service: 12/26/2015 2:15 PM Medical Record Patient Account Number: [id number] Number: Treating RN: Baruch Gouty, RN, BSN, Rita May 29, 1941 [phone] y.o. Other Clinician: Date of Birth/Sex: Female) Treating Kehlani Vancamp Primary Care Physician/Extender: Lonn Georgia Physician: Referring Physician: [name] in Treatment: 2 Debridement Performed for Wound #2 Right,Distal Lower Leg Assessment: Performed By: Physician Ricard Dillon, MD Debridement: Debridement Pre-procedure Yes Verification/Time Out Taken: Start Time: 14:55 Pain Control: Lidocaine 4% Topical Solution Level: Skin/Subcutaneous Tissue Total Area Debrided ([name] x 2 (cm) x 3 (cm) = 6 (cm) W): Tissue and other Non-Viable, Exudate, Fibrin/Slough, Subcutaneous material debrided: Instrument: Curette Bleeding: Minimum Hemostasis Achieved: Pressure End Time: 15:00 Procedural Pain: 0 Post Procedural Pain: 0 Response to Treatment: Procedure was tolerated well Post Debridement Measurements of Total Wound Length: (cm) 2 Width: (cm) 3 Depth: (cm) 0.1 Volume: (cm) 0.471 Post Procedure Diagnosis Same as Pre-procedure Electronic Signature(s) Signed: 12/26/2015 5:12:30 PM By: Regan Lemming BSN, RN Signed: 12/27/2015 8:00:31 AM By: Linton Ham MD Entered By: Linton Ham on 12/26/2015 15:04:42 [name] [name], [name] [name] [name], [name] -------------------------------------------------------------------------------- HPI  Details Patient Name: [name] [name], [name] Date of Service: 12/26/2015 2:15 PM Medical Record Patient Account Number: [id number] Number: Treating RN: Baruch Gouty, RN, BSN, Rita 25-Nov-1940 [phone] y.o. Other Clinician: Date of Birth/Sex: Female) Treating Laticia Vannostrand Primary Care Physician/Extender: Lonn Georgia Physician: Referring Physician: [name] in Treatment: 2 History of Present Illness HPI Description: 12/06/15; this is a patient who has no prior wound history and is not a diabetic. In February she was getting into her family truck and the wind pushed the door against the outer aspect of her right lower leg. I believe there  was initially some swelling but not clearly a hematoma. 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 Has been using topical antibiotics on this. 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 There is already been some improvement. She has no prior history of PAD and no major history of venous insufficiency. 12/13/15; wound appears healthy, using prisma 12/20/15; patient arrives complaining of increasing pain around the wound. She also had a blister under the wound. 12/26/15; the patient arrives with the blister last week fully excised and healed however she has another floppy flaccid blister just below her wound. The wound does not appear to be infected. The cause of this is not really clear, he does walk 3 miles a day but doesn't think that the area with a blister is currently rubs the bandage at all. She said she felt a stinging and then noticed this on the weekend. She will complete the antibiotics I gave hertomorrow Electronic Signature(s) Signed: 12/27/2015 8:00:31 AM By: Linton Ham MD Entered By: Linton Ham on 12/26/2015 15:07:34 [name] [name], [name] -------------------------------------------------------------------------------- Physical Exam Details Patient Name: [name] [name], [name] Date of Service:  12/26/2015 2:15 PM Medical Record Patient Account Number: [id number] Number: Treating RN: Baruch Gouty, RN, BSN, Rita 12/08/1940 [phone] y.o. Other Clinician: Date of Birth/Sex: Female) Treating Gracen Ringwald Primary Care Physician/Extender: Lonn Georgia Physician: Referring Physician: [name] in Treatment: 2 Notes Wound exam; the wound was debridement it as circumferential eschar and fibrinous slough. She has a new blister which is now inferior to the wound quite a bit separated from last week's which is already heel I went ahead and cultured this although I have no specific reason to believe this is infected Electronic Signature(s) Signed: 12/27/2015 8:00:31 AM By: Linton Ham MD Entered By: Linton Ham on 12/26/2015 15:11:36 [name] [name], [name] -------------------------------------------------------------------------------- Physician Orders Details Patient Name: [name] [name], [name] Date of Service: 12/26/2015 2:15 PM Medical Record Patient Account Number: [id number] Number: Treating RN: Baruch Gouty, RN, BSN, Rita 1940/10/01 [phone] y.o. Other Clinician: Date of Birth/Sex: Female) Treating Bellarae Lizer Primary Care Physician/Extender: Lonn Georgia Physician: Referring Physician: [name] in Treatment: 2 Verbal / Phone Orders: Yes Clinician: Afful, RN, BSN, Rita Read Back and Verified: Yes Diagnosis Coding Wound Cleansing Wound #1 Right,Lateral Lower Leg o Cleanse wound with mild soap and water o May Shower, gently pat wound dry prior to applying new dressing. o May shower with protection. Wound #2 Right,Distal Lower Leg o Cleanse wound with mild soap and water o May Shower, gently pat wound dry prior to applying new dressing. o May shower with protection. Anesthetic Wound #1 Right,Lateral Lower Leg o Topical Lidocaine 4% cream applied to wound bed prior to debridement Wound #2 Right,Distal Lower Leg o  Topical Lidocaine 4% cream applied to wound bed prior to debridement Skin Barriers/Peri-Wound Care Wound #1 Right,Lateral Lower Leg o Barrier cream Wound #2 Right,Distal Lower Leg o Barrier cream Primary Wound Dressing Wound #1 Right,Lateral Lower Leg o Aquacel Ag Wound #2 Right,Distal Lower Leg o Aquacel Ag Secondary Dressing Philemon, Shivon [name] ([id number]) Wound #1 Right,Lateral Lower Leg o Gauze and Kerlix/Conform Wound #2 Right,Distal Lower Leg o Gauze and Kerlix/Conform Dressing Change Frequency Wound #1 Right,Lateral Lower Leg o Change dressing every day. Wound #2 Right,Distal Lower Leg o Change dressing every day. Follow-up Appointments Wound #1 Right,Lateral Lower Leg o Return Appointment in 1 week. Wound #2 Right,Distal Lower Leg o Return Appointment in 1 week. Additional  Orders / Instructions Wound #1 Right,Lateral Lower Leg o Increase protein intake. o Activity as tolerated Wound #2 Right,Distal Lower Leg o Increase protein intake. o Activity as tolerated Medications-please add to medication list. Wound #1 Right,Lateral Lower Leg o P.O. Antibiotics - Continue Doxy 100mg  bid x 7 days Wound #2 Right,Distal Lower Leg o P.O. Antibiotics - Continue Doxy 100mg  bid x 7 days Laboratory o Bacteria identified in Wound by Culture (MICRO) - right ankle oooo LOINC Code: [id number] oooo Convenience Name: Wound culture routine Electronic Signature(s) Signed: 12/26/2015 5:12:30 PM By: Regan Lemming BSN, RN Signed: 12/27/2015 8:00:31 AM By: Linton Ham MD [name] [name], [name] [name] ([id number]) Entered By: Regan Lemming on 12/26/2015 15:00:27 [name] [name], [name] -------------------------------------------------------------------------------- Problem List Details Patient Name: Reddinger, Hydia [name] Date of Service: 12/26/2015 2:15 PM Medical Record Patient Account Number: [id number] Number: Treating RN: Baruch Gouty, RN, BSN, Rita 02-06-1941 [phone] y.o. 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 -------------------------------------------------------------------------------- Progress Note Details Patient Name: [name] [name], [name] Date of Service: 12/26/2015 2:15 PM Medical Record Patient Account Number: [id number] Number: Treating RN: Baruch Gouty, RN, BSN, Rita [birth year] [phone] y.o. Other Clinician: Date of Birth/Sex: Female) Treating Khiry Pasquariello Primary Care Physician/Extender: Lonn Georgia Physician: Referring Physician: [name] in Treatment: 2 Subjective Chief Complaint Information obtained from Patient Patient is here for a chronic wound on her right anterior lateral leg History of Present Illness (HPI) 12/06/15; this is a patient who has no prior wound history and is not a diabetic. In February she was getting into her family truck and the wind pushed the door against the outer aspect of her right lower leg. I believe there was initially some swelling but not clearly a hematoma. 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 Has been using topical antibiotics on this. 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 There is already been some improvement. She has no prior history of PAD and no major history of venous insufficiency. 12/13/15; wound appears healthy, using prisma 12/20/15; patient arrives complaining of increasing pain  around the wound. She also had a blister under the wound. 12/26/15; the patient arrives with the blister last week fully excised and healed however she has another floppy flaccid blister just below her wound. The wound does not appear to be infected. The cause of this is not really clear, he does walk 3 miles a day but doesn't think that the area with a blister is currently rubs the bandage at all. She said she felt a stinging and then noticed this on the weekend. She will complete the antibiotics I gave hertomorrow Objective Constitutional Vitals Time Taken: 2:35 PM, Height: 65 in, Weight: 134 lbs, BMI: 22.3, Temperature: 97.5 F, Pulse: 66 bpm, Respiratory Rate: 18 breaths/min, Blood Pressure: 122/50 mmHg. Integumentary (Hair, Skin) Wound #1 status is Open. Original cause of wound was Trauma. The wound is located on the Right,Lateral Lower Leg. The wound measures 1.5cm length x 1.3cm width x 0.3cm depth; 1.532cm^2 area and Zeiss, Venise [name] ([id number]) 0.459cm^3 volume. The wound is limited  to skin breakdown. There is no tunneling or undermining noted. There is a large amount of serosanguineous drainage noted. The wound margin is distinct with the outline attached to the wound base. There is small (1-33%) granulation within the wound bed. There is a large (67- 100%) amount of necrotic tissue within the wound bed including Adherent Slough. The periwound skin appearance exhibited: Localized Edema, Moist. The periwound skin appearance did not exhibit: Callus, Crepitus, Excoriation, Fluctuance, Friable, Induration, Rash, Scarring, Dry/Scaly, Maceration, Atrophie Blanche, Cyanosis, Ecchymosis, Hemosiderin Staining, Mottled, Pallor, Rubor, Erythema. Periwound temperature was noted as No Abnormality. The periwound has tenderness on palpation. Wound #2 status is Open. Original cause of wound was Blister. The wound is located on the Right,Distal Lower Leg. The wound measures 2cm length x 3cm width x  0.1cm depth; 4.712cm^2 area and 0.471cm^3 volume. The wound is limited to skin breakdown. There is no tunneling or undermining noted. There is a large amount of serosanguineous drainage noted. The wound margin is distinct with the outline attached to the wound base. There is large (67-100%) pink, pale granulation within the wound bed. There is no necrotic tissue within the wound bed. The periwound skin appearance exhibited: Localized Edema, Moist. The periwound skin appearance did not exhibit: Callus, Crepitus, Excoriation, Fluctuance, Friable, Induration, Rash, Scarring, Dry/Scaly, Maceration, Atrophie Blanche, Cyanosis, Ecchymosis, Hemosiderin Staining, Mottled, Pallor, Rubor, Erythema. Periwound temperature was noted as No Abnormality. The periwound has tenderness on palpation. 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 There was a Skin/Subcutaneous Tissue Debridement [id number]) debridement with total area of 1.95 sq cm performed by Ricard Dillon, MD. with the following instrument(s): Curette to remove Non-Viable tissue/material including Exudate, Fibrin/Slough, and Subcutaneous after achieving pain control using Lidocaine 4% Topical Solution. A time out was conducted prior to the start of the procedure. A Minimum amount of bleeding was controlled with Pressure. The procedure was tolerated well with a pain level of 0 throughout and a pain level of 0 following the procedure. Post Debridement Measurements: 1.5cm length x 1.3cm width x 0.3cm depth; 0.459cm^3 volume. Post procedure Diagnosis Wound #1: Same as Pre-Procedure Scherger, Joette [name] ([id number]) Wound #2 Wound #2 is a Trauma, Other located on the Right,Distal Lower Leg . There was a Skin/Subcutaneous Tissue Debridement  [id number]) debridement with total area of 6 sq cm performed by Ricard Dillon, MD. with the following instrument(s): Curette to remove Non-Viable tissue/material including Exudate, Fibrin/Slough, and Subcutaneous after achieving pain control using Lidocaine 4% Topical Solution. A time out was conducted prior to the start of the procedure. A Minimum amount of bleeding was controlled with Pressure. The procedure was tolerated well with a pain level of 0 throughout and a pain level of 0 following the procedure. Post Debridement Measurements: 2cm length x 3cm width x 0.1cm depth; 0.471cm^3 volume. Post procedure Diagnosis Wound #2: Same as Pre-Procedure Plan Wound Cleansing: Wound #1 Right,Lateral Lower Leg: Cleanse wound with mild soap and water May Shower, gently pat wound dry prior to applying new dressing. May shower with protection. Wound #2 Right,Distal Lower Leg: Cleanse wound with mild soap and water May Shower, gently pat wound dry prior to applying new dressing. May shower with protection. Anesthetic: Wound #1 Right,Lateral Lower Leg: Topical Lidocaine 4% cream applied to wound bed prior to debridement Wound #2 Right,Distal Lower Leg: Topical Lidocaine  4% cream applied to wound bed prior to debridement Skin Barriers/Peri-Wound Care: Wound #1 Right,Lateral Lower Leg: Barrier cream Wound #2 Right,Distal Lower Leg: Barrier cream Primary Wound Dressing: Wound #1 Right,Lateral Lower Leg: Aquacel Ag Wound #2 Right,Distal Lower Leg: Aquacel Ag Secondary Dressing: Wound #1 Right,Lateral Lower Leg: Gauze and Kerlix/Conform Wound #2 Right,Distal Lower Leg: Gauze and Kerlix/Conform Dressing Change Frequency: Wound #1 Right,Lateral Lower Leg: Change dressing every day. Kakar, Tomeca [name] ([id number]) Wound #2 Right,Distal Lower Leg: Change dressing every day. Follow-up Appointments: Wound #1 Right,Lateral Lower Leg: Return Appointment in 1 week. Wound #2 Right,Distal Lower  Leg: Return Appointment in 1 week. Additional Orders / Instructions: Wound #1 Right,Lateral Lower Leg: Increase protein intake. Activity as tolerated Wound #2 Right,Distal Lower Leg: Increase protein intake. Activity as tolerated Medications-please add to medication list.: Wound #1 Right,Lateral Lower Leg: P.O. Antibiotics - Continue Doxy 100mg  bid x 7 days Wound #2 Right,Distal Lower Leg: P.O. Antibiotics - Continue Doxy 100mg  bid x 7 days Laboratory ordered were: Wound culture routine - right ankle Aquacel Ag to continue with the wound and the exercise blister. Curlex and conform Electronic Signature(s) Signed: 12/27/2015 8:00:31 AM By: Linton Ham MD Entered By: Linton Ham on 12/26/2015 15:12:47 Flaharty, Yanina [name] ([id number]) -------------------------------------------------------------------------------- SuperBill Details Patient Name: Headrick, Laveta [name] Date of Service: 12/26/2015 Medical Record Patient Account Number: [id number] Number: Treating RN: Baruch Gouty, RN, BSN, Rita 18-Apr-1941 [phone] y.o. Other Clinician: Date of Birth/Sex: Female) Treating Elana [name] Primary Care Physician/Extender: Lonn Georgia Physician: Suella Grove in Treatment: 2 Referring Physician: Margarita Rana Diagnosis Coding ICD-10 Codes Code Description [phone] Non-pressure chronic ulcer of right calf with necrosis of muscle I87.2 Venous insufficiency (chronic) (peripheral) Facility Procedures CPT4 Code Description: [id number] - DEB SUBQ TISSUE 20 SQ CM/< ICD-10 Description Diagnosis L97.213 Non-pressure chronic ulcer of right calf with necro Modifier: sis of muscl Quantity: 1 e Physician Procedures CPT4 Code Description: [id number] - WC PHYS SUBQ TISS 20 SQ CM ICD-10 Description Diagnosis L97.213 Non-pressure chronic ulcer of right calf with necro Modifier: sis of muscle Quantity: 1 Electronic Signature(s) Signed: 12/27/2015 8:00:31 AM By: Linton Ham MD Entered By: Linton Ham on 12/26/2015 15:14:06

## 2015-12-30 LAB — WOUND CULTURE: CULTURE: NO GROWTH

## 2016-01-01 ENCOUNTER — Ambulatory Visit
Admission: RE | Admit: 2016-01-01 | Discharge: 2016-01-01 | Disposition: A | Discharge status: Home / Self Care | Payer: PPO | Source: Ambulatory Visit | Attending: Obstetrics and Gynecology | Admitting: Obstetrics and Gynecology

## 2016-01-01 DIAGNOSIS — N839 Noninflammatory disorder of ovary, fallopian tube and broad ligament, unspecified: Secondary | ICD-10-CM | POA: Insufficient documentation

## 2016-01-01 DIAGNOSIS — M542 Cervicalgia: Secondary | ICD-10-CM

## 2016-01-01 DIAGNOSIS — N83202 Unspecified ovarian cyst, left side: Secondary | ICD-10-CM | POA: Diagnosis not present

## 2016-01-01 DIAGNOSIS — N838 Other noninflammatory disorders of ovary, fallopian tube and broad ligament: Secondary | ICD-10-CM

## 2016-01-02 ENCOUNTER — Encounter: Payer: PPO | Admitting: Internal Medicine

## 2016-01-02 DIAGNOSIS — L97213 Non-pressure chronic ulcer of right calf with necrosis of muscle: Secondary | ICD-10-CM | POA: Diagnosis not present

## 2016-01-02 DIAGNOSIS — S81811A Laceration without foreign body, right lower leg, initial encounter: Secondary | ICD-10-CM | POA: Diagnosis not present

## 2016-01-03 ENCOUNTER — Inpatient Hospital Stay: Payer: PPO | Attending: Obstetrics and Gynecology | Admitting: Obstetrics and Gynecology

## 2016-01-03 ENCOUNTER — Inpatient Hospital Stay: Payer: PPO

## 2016-01-03 VITALS — BP 130/76 | HR 68 | Temp 98.2°F | Wt 133.2 lb

## 2016-01-03 DIAGNOSIS — Z87891 Personal history of nicotine dependence: Secondary | ICD-10-CM | POA: Diagnosis not present

## 2016-01-03 DIAGNOSIS — Z7982 Long term (current) use of aspirin: Secondary | ICD-10-CM | POA: Diagnosis not present

## 2016-01-03 DIAGNOSIS — N838 Other noninflammatory disorders of ovary, fallopian tube and broad ligament: Secondary | ICD-10-CM

## 2016-01-03 DIAGNOSIS — I493 Ventricular premature depolarization: Secondary | ICD-10-CM | POA: Diagnosis not present

## 2016-01-03 DIAGNOSIS — E7800 Pure hypercholesterolemia, unspecified: Secondary | ICD-10-CM | POA: Diagnosis not present

## 2016-01-03 DIAGNOSIS — J329 Chronic sinusitis, unspecified: Secondary | ICD-10-CM | POA: Diagnosis not present

## 2016-01-03 DIAGNOSIS — Z79899 Other long term (current) drug therapy: Secondary | ICD-10-CM | POA: Insufficient documentation

## 2016-01-03 DIAGNOSIS — K219 Gastro-esophageal reflux disease without esophagitis: Secondary | ICD-10-CM | POA: Diagnosis not present

## 2016-01-03 DIAGNOSIS — J439 Emphysema, unspecified: Secondary | ICD-10-CM | POA: Insufficient documentation

## 2016-01-03 DIAGNOSIS — Z780 Asymptomatic menopausal state: Secondary | ICD-10-CM | POA: Diagnosis not present

## 2016-01-03 DIAGNOSIS — D3912 Neoplasm of uncertain behavior of left ovary: Secondary | ICD-10-CM | POA: Insufficient documentation

## 2016-01-03 DIAGNOSIS — E039 Hypothyroidism, unspecified: Secondary | ICD-10-CM | POA: Insufficient documentation

## 2016-01-03 DIAGNOSIS — D3502 Benign neoplasm of left adrenal gland: Secondary | ICD-10-CM | POA: Diagnosis not present

## 2016-01-03 NOTE — Progress Notes (Signed)
EBELIN, ANIOL ([id number]) Visit Report for 01/02/2016 Arrival Information Details Patient Name: [name] [name], [name] Date of Service: 01/02/2016 2:15 PM Medical Record Patient Account Number: [id number] Number: Treating RN: Baruch Gouty, RN, BSN, Rita [birth year] [phone] y.o. Other Clinician: Date of Birth/Sex: Female) Treating ROBSON, [place] Primary Care Physician: Margarita Rana Physician/Extender: G Referring Physician: [name] in Treatment: 3 Visit Information History Since Last Visit Added or deleted any medications: No Patient Arrived: Ambulatory Any new allergies or adverse reactions: No Arrival Time: 14:25 Had a fall or experienced change in No Accompanied By: self activities of daily living that may affect Transfer Assistance: None risk of falls: Patient Identification Verified: Yes Signs or symptoms of abuse/neglect since last No Secondary Verification Process Yes visito Completed: Hospitalized since last visit: No Patient Requires Transmission-Based No Has Dressing in Place as Prescribed: Yes Precautions: Pain Present Now: No Patient Has Alerts: No Electronic Signature(s) Signed: 01/02/2016 5:19:00 PM By: Regan Lemming BSN, RN Entered By: Regan Lemming on 01/02/2016 14:25:29 [name] [name], [name] -------------------------------------------------------------------------------- Encounter Discharge Information Details Patient Name: [name] [name], [name] Date of Service: 01/02/2016 2:15 PM Medical Record Patient Account Number: [id number] Number: Treating RN: Baruch Gouty, RN, BSN, Rita 1941/04/04 [phone] y.o. Other Clinician: Date of Birth/Sex: Female) Treating ROBSON, MICHAEL Primary Care Physician: Margarita Rana Physician/Extender: G Referring Physician: [name] in Treatment: 3 Encounter Discharge Information Items Discharge Pain Level: 0 Discharge Condition: Stable Ambulatory Status: Ambulatory Discharge Destination: Home Transportation:  Private Auto Accompanied By: self Schedule Follow-up Appointment: No Medication Reconciliation completed and provided to Patient/Care No Jovon Winterhalter: Provided on Clinical Summary of Care: 01/02/2016 Form Type Recipient Paper Patient NB Electronic Signature(s) Signed: 01/02/2016 2:47:25 PM By: Ruthine Dose Entered By: Ruthine Dose on 01/02/2016 14:47:25 [name] [name], [place]. ([id number]) -------------------------------------------------------------------------------- Lower Extremity Assessment Details Patient Name: [name] [name], [name] Date of Service: 01/02/2016 2:15 PM Medical Record Patient Account Number: [id number] Number: Treating RN: Baruch Gouty, RN, BSN, Rita [birth year] [phone] y.o. Other Clinician: Date of Birth/Sex: Female) Treating ROBSON, [place] Primary Care Physician: Margarita Rana Physician/Extender: G Referring Physician: [name] in Treatment: 3 Edema Assessment Assessed: [Left: No] [Right: No] E[Left: dema] [Right: :] Calf Left: Right: Point of Measurement: 32 cm From Medial Instep cm 35.6 cm Ankle Left: Right: Point of Measurement: 7 cm From Medial Instep cm 21 cm Vascular Assessment Claudication: Claudication Assessment [Right:None] Pulses: Posterior Tibial Dorsalis Pedis Palpable: [Right:Yes] Extremity colors, hair growth, and conditions: Extremity Color: [Right:Normal] Hair Growth on Extremity: [Right:No] Temperature of Extremity: [Right:Warm] Capillary Refill: [Right:< 3 seconds] Toe Nail Assessment Left: Right: Thick: No Discolored: No Deformed: No Improper Length and Hygiene: No Electronic Signature(s) Signed: 01/02/2016 5:19:00 PM By: Regan Lemming BSN, RN [name] [name], [name] Entered By: Regan Lemming on 01/02/2016 14:28:55 [name] [name], [name] -------------------------------------------------------------------------------- Multi Wound Chart Details Patient Name: [name] [name], [name] Date of Service: 01/02/2016 2:15 PM Medical  Record Patient Account Number: [id number] Number: Treating RN: Baruch Gouty, RN, BSN, Rita 12/08/1940 [phone] y.o. Other Clinician: Date of Birth/Sex: Female) Treating ROBSON, [place] Primary Care Physician: Margarita Rana Physician/Extender: G Referring Physician: [name] in Treatment: 3 Vital Signs Height(in): 65 Pulse(bpm): 69 Weight(lbs): 134 Blood Pressure 143/69 (mmHg): Body Mass Index(BMI): 22 Temperature(F): 97.2 Respiratory Rate 16 (breaths/min): Photos: [1:No Photos] [2:No Photos] [N/A:N/A] Wound Location: [1:Right Lower Leg - Lateral] [2:Right Lower Leg - Distal] [N/A:N/A] Wounding Event: [1:Trauma] [2:Blister] [N/A:N/A] Primary Etiology: [1:Skin Tear] [2:Trauma, Other] [N/A:N/A] Comorbid History: [1:Cataracts, Chronic Obstructive Pulmonary Disease (COPD), Hypertension, Osteoarthritis, Neuropathy,  Confinement Anxiety] [2:Cataracts, Chronic Obstructive Pulmonary Disease (COPD), Hypertension, Osteoarthritis, Neuropathy, Confinement  Anxiety] [N/A:N/A] Date Acquired: [1:10/11/2015] [2:12/13/2015] [N/A:N/A] Weeks of Treatment: [1:3] [2:2] [N/A:N/A] Wound Status: [1:Open] [2:Open] [N/A:N/A] Measurements [name] x W x D 1x1x0.2 [2:1.4x1.5x0.1] [N/A:N/A] (cm) Area (cm) : [1:0.785] [2:1.649] [N/A:N/A] Volume (cm) : [1:0.157] [2:0.165] [N/A:N/A] % Reduction in Area: [1:75.00%] [2:-49.90%] [N/A:N/A] % Reduction in Volume: 50.00% [2:-50.00%] [N/A:N/A] Classification: [1:Full Thickness Without Exposed Support Structures] [2:Full Thickness Without Exposed Support Structures] [N/A:N/A] Exudate Amount: [1:Small] [2:Small] [N/A:N/A] Exudate Type: [1:Serosanguineous] [2:Serosanguineous] [N/A:N/A] Exudate Color: [1:red, brown] [2:red, brown] [N/A:N/A] Wound Margin: [1:Distinct, outline attached] [2:Distinct, outline attached] [N/A:N/A] Granulation Amount: [1:Large (67-100%)] [2:Medium (34-66%)] [N/A:N/A] Granulation Quality: Pink, Pale Pink, Pale N/A Necrotic Amount:  Small (1-33%) Medium (34-66%) N/A Exposed Structures: Fascia: No Fascia: No N/A Fat: No Fat: No Tendon: No Tendon: No Muscle: No Muscle: No Joint: No Joint: No Bone: No Bone: No Limited to Skin Limited to Skin Breakdown Breakdown Epithelialization: Large (67-100%) Medium (34-66%) N/A Periwound Skin Texture: Edema: Yes Edema: Yes N/A Excoriation: No Excoriation: No Induration: No Induration: No Callus: No Callus: No Crepitus: No Crepitus: No Fluctuance: No Fluctuance: No Friable: No Friable: No Rash: No Rash: No Scarring: No Scarring: No Periwound Skin Moist: Yes Moist: Yes N/A Moisture: Maceration: No Maceration: No Dry/Scaly: No Dry/Scaly: No Periwound Skin Color: Atrophie Blanche: No Atrophie Blanche: No N/A Cyanosis: No Cyanosis: No Ecchymosis: No Ecchymosis: No Erythema: No Erythema: No Hemosiderin Staining: No Hemosiderin Staining: No Mottled: No Mottled: No Pallor: No Pallor: No Rubor: No Rubor: No Temperature: No Abnormality No Abnormality N/A Tenderness on Yes Yes N/A Palpation: Wound Preparation: Ulcer Cleansing: Ulcer Cleansing: Other: N/A Rinsed/Irrigated with water and soap Saline Topical Anesthetic Topical Anesthetic Applied: Other: lidociane Applied: Other: lidocaine 4% 4% Treatment Notes Electronic Signature(s) Signed: 01/02/2016 5:19:00 PM By: Regan Lemming BSN, RN Entered By: Regan Lemming on 01/02/2016 14:34:15 [name] [name], [name] [name] [place] ([id number]) -------------------------------------------------------------------------------- [place] Details Patient Name: [name] [name], [name] Date of Service: 01/02/2016 2:15 PM Medical Record Patient Account Number: [id number] Number: Treating RN: Baruch Gouty, RN, BSN, Rita 11-01-1940 [phone] y.o. Other Clinician: Date of Birth/Sex: Female) Treating ROBSON, MICHAEL Primary Care Physician: Margarita Rana Physician/Extender: G Referring Physician: [name] in Treatment:  3 Active Inactive Orientation to the Wound Care Program Nursing Diagnoses: Knowledge deficit related to the wound healing center program Goals: Patient/caregiver will verbalize understanding of the [place] Program Date Initiated: 12/06/2015 Goal Status: Active Interventions: Provide education on orientation to the wound center Notes: Venous Leg Ulcer Nursing Diagnoses: Knowledge deficit related to disease process and management Potential for venous Insuffiency (use before diagnosis confirmed) Goals: Non-invasive venous studies are completed as ordered Date Initiated: 12/06/2015 Goal Status: Active Patient will maintain optimal edema control Date Initiated: 12/06/2015 Goal Status: Active Patient/caregiver will verbalize understanding of disease process and disease management Date Initiated: 12/06/2015 Goal Status: Active Verify adequate tissue perfusion prior to therapeutic compression application Date Initiated: 12/06/2015 Goal Status: Active Rubiano, Cionna [name] ([id number]) Interventions: Assess peripheral edema status every visit. Compression as ordered Provide education on venous insufficiency Treatment Activities: Non-invasive vascular studies : 12/13/2015 Therapeutic compression applied : 12/13/2015 Notes: Wound/Skin Impairment Nursing Diagnoses: Impaired tissue integrity Knowledge deficit related to smoking impact on wound healing Knowledge deficit related to ulceration/compromised skin integrity Goals: Patient/caregiver will verbalize understanding of skin care regimen Date Initiated: 12/06/2015 Goal Status: Active Ulcer/skin breakdown will have a volume reduction of 30% by week 4 Date Initiated: 12/06/2015 Goal Status: Active Ulcer/skin breakdown  will have a volume reduction of 50% by week 8 Date Initiated: 12/06/2015 Goal Status: Active Ulcer/skin breakdown will have a volume reduction of 80% by week 12 Date Initiated: 12/06/2015 Goal Status:  Active Ulcer/skin breakdown will heal within 14 weeks Date Initiated: 12/06/2015 Goal Status: Active Interventions: Assess patient/caregiver ability to perform ulcer/skin care regimen upon admission and as needed Assess ulceration(s) every visit Provide education on ulcer and skin care Treatment Activities: Referred to DME Karmello Abercrombie for dressing supplies : 12/13/2015 Skin care regimen initiated : 12/13/2015 Topical wound management initiated : 12/13/2015 [name] [name], [name] Notes: Electronic Signature(s) Signed: 01/02/2016 5:19:00 PM By: Regan Lemming BSN, RN Entered By: Regan Lemming on 01/02/2016 14:34:04 [name] [name], [name] -------------------------------------------------------------------------------- Pain Assessment Details Patient Name: Wagman, Marietta [name] Date of Service: 01/02/2016 2:15 PM Medical Record Patient Account Number: [id number] Number: Treating RN: Baruch Gouty, RN, BSN, Rita 01-Mar-1941 [phone] y.o. Other Clinician: Date of Birth/Sex: Female) Treating ROBSON, MICHAEL Primary Care Physician: Margarita Rana Physician/Extender: G Referring Physician: [name] in Treatment: 3 Active Problems Location of Pain Severity and Description of Pain Patient Has Paino No Site Locations Pain Management and Medication Current Pain Management: Electronic Signature(s) Signed: 01/02/2016 5:19:00 PM By: Regan Lemming BSN, RN Entered By: Regan Lemming on 01/02/2016 14:24:58 [name] [name], [name] -------------------------------------------------------------------------------- Patient/Caregiver Education Details Patient Name: Shelton, Zarayah [name] Date of Service: 01/02/2016 2:15 PM Medical Record Patient Account Number: [id number] Number: Treating RN: Baruch Gouty, RN, BSN, Rita 12-16-1940 [phone] y.o. Other Clinician: Date of Birth/Gender: Female) Treating ROBSON, [place] Primary Care Physician: Margarita Rana Physician/Extender: G Referring Physician: [name] in Treatment: 3 Education Assessment Education Provided To: Patient Education Topics Provided Basic Hygiene: Methods: Explain/Verbal Responses: State content correctly Venous: Methods: Explain/Verbal Responses: State content correctly Welcome To [facility]: Methods: Explain/Verbal Responses: State content correctly Wound/Skin Impairment: Methods: Explain/Verbal Responses: State content correctly Electronic Signature(s) Signed: 01/02/2016 5:19:00 PM By: Regan Lemming BSN, RN Entered By: Regan Lemming on 01/02/2016 14:44:47 Soden, Soniyah [name] ([id number]) -------------------------------------------------------------------------------- Wound Assessment Details Patient Name: Sylva, Carizma [name] Date of Service: 01/02/2016 2:15 PM Medical Record Patient Account Number: [id number] Number: Treating RN: Baruch Gouty, RN, BSN, Rita [birth year] [phone] y.o. Other Clinician: Date of Birth/Sex: Female) Treating ROBSON, [place] Primary Care Physician: Margarita Rana Physician/Extender: G Referring Physician: [name] in Treatment: 3 Wound Status Wound Number: 1 Primary Skin Tear Etiology: Wound Location: Right Lower Leg - Lateral Wound Open Wounding Event: Trauma Status: Date Acquired: 10/11/2015 Comorbid Cataracts, Chronic Obstructive Weeks Of Treatment: 3 History: Pulmonary Disease (COPD), Clustered Wound: No Hypertension, Osteoarthritis, Neuropathy, Confinement Anxiety Photos Photo Uploaded By: Regan Lemming on 01/02/2016 17:15:54 Wound Measurements Length: (cm) 1 Width: (cm) 1 Depth: (cm) 0.2 Area: (cm) 0.785 Volume: (cm) 0.157 % Reduction in Area: 75% % Reduction in Volume: 50% Epithelialization: Large (67-100%) Tunneling: No Undermining: No Wound Description Classification: Dax, Kristy [name] ([id number]) Foul Odor After Cleansing: No Full Thickness Without Exposed Support Structures Wound Margin: Distinct, outline  attached Exudate Small Amount: Exudate Type: Serosanguineous Exudate Color: red, brown Wound Bed Granulation Amount: Large (67-100%) Exposed Structure Granulation Quality: Pink, Pale Fascia Exposed: No Necrotic Amount: Small (1-33%) Fat Layer Exposed: No Necrotic Quality: Adherent Slough Tendon Exposed: No Muscle Exposed: No Joint Exposed: No Bone Exposed: No Limited to Skin Breakdown Periwound Skin Texture Texture Color No Abnormalities Noted: No No Abnormalities Noted: No Callus: No Atrophie Blanche: No Crepitus: No Cyanosis: No Excoriation: No Ecchymosis: No Fluctuance: No Erythema: No Friable: No  Hemosiderin Staining: No Induration: No Mottled: No Localized Edema: Yes Pallor: No Rash: No Rubor: No Scarring: No Temperature / Pain Moisture Temperature: No Abnormality No Abnormalities Noted: No Tenderness on Palpation: Yes Dry / Scaly: No Maceration: No Moist: Yes Wound Preparation Ulcer Cleansing: Rinsed/Irrigated with Saline Topical Anesthetic Applied: Other: lidocaine 4%, Treatment Notes Wound #1 (Right, Lateral Lower Leg) 1. Cleansed with: Clean wound with Normal Saline 3. Peri-wound Care: Barrier cream Menzie, Kresha [name] ([id number]) 4. Dressing Applied: Prisma Ag 5. Secondary Dressing Applied Gauze and Kerlix/Conform 7. Secured with Recruitment consultant) Signed: 01/02/2016 5:19:00 PM By: Regan Lemming BSN, RN Entered By: Regan Lemming on 01/02/2016 14:33:02 Fanning, Shontelle [name] ([id number]) -------------------------------------------------------------------------------- Wound Assessment Details Patient Name: Placide, Ashani [name] Date of Service: 01/02/2016 2:15 PM Medical Record Patient Account Number: [id number] Number: Treating RN: Baruch Gouty, RN, BSN, Rita [birth year] [phone] y.o. Other Clinician: Date of Birth/Sex: Female) Treating ROBSON, MICHAEL Primary Care Physician: Margarita Rana Physician/Extender: G Referring Physician: [name] in Treatment: 3 Wound Status Wound Number: 2 Primary Trauma, Other Etiology: Wound Location: Right Lower Leg - Distal Wound Open Wounding Event: Blister Status: Date Acquired: 12/13/2015 Comorbid Cataracts, Chronic Obstructive Weeks Of Treatment: 2 History: Pulmonary Disease (COPD), Clustered Wound: No Hypertension, Osteoarthritis, Neuropathy, Confinement Anxiety Photos Photo Uploaded By: Regan Lemming on 01/02/2016 17:15:55 Wound Measurements Length: (cm) 1.4 Width: (cm) 1.5 Depth: (cm) 0.1 Area: (cm) 1.649 Volume: (cm) 0.165 % Reduction in Area: -49.9% % Reduction in Volume: -50% Epithelialization: Medium (34-66%) Tunneling: No Undermining: No Wound Description Full Thickness Without Exposed Classification: Support Structures Wound Margin: Distinct, outline attached Exudate Small Amount: Exudate Type: Serosanguineous Exudate Color: red, brown Weyland, Kaniah [name] ([id number]) Foul Odor After Cleansing: No Wound Bed Granulation Amount: Medium (34-66%) Exposed Structure Granulation Quality: Pink, Pale Fascia Exposed: No Necrotic Amount: Medium (34-66%) Fat Layer Exposed: No Necrotic Quality: Adherent Slough Tendon Exposed: No Muscle Exposed: No Joint Exposed: No Bone Exposed: No Limited to Skin Breakdown Periwound Skin Texture Texture Color No Abnormalities Noted: No No Abnormalities Noted: No Callus: No Atrophie Blanche: No Crepitus: No Cyanosis: No Excoriation: No Ecchymosis: No Fluctuance: No Erythema: No Friable: No Hemosiderin Staining: No Induration: No Mottled: No Localized Edema: Yes Pallor: No Rash: No Rubor: No Scarring: No Temperature / Pain Moisture Temperature: No Abnormality No Abnormalities Noted: No Tenderness on Palpation: Yes Dry / Scaly: No Maceration: No Moist: Yes Wound Preparation Ulcer Cleansing: Other: water and soap, Topical Anesthetic Applied: Other: lidociane 4%, Treatment Notes Wound #2 (Right,  Distal Lower Leg) 1. Cleansed with: Clean wound with Normal Saline 3. Peri-wound Care: Barrier cream 4. Dressing Applied: Prisma Ag 5. Secondary Dressing Applied Gauze and Kerlix/Conform 7. Secured with Tape Electronic Signature(s) KOSHA, MCINTURF ([id number]) Signed: 01/02/2016 5:19:00 PM By: Regan Lemming BSN, RN Entered By: Regan Lemming on 01/02/2016 14:33:56 Tardiff, Beya [name] ([id number]) -------------------------------------------------------------------------------- Vitals Details Patient Name: Kalb, Kiosha [name] Date of Service: 01/02/2016 2:15 PM Medical Record Patient Account Number: [id number] Number: Treating RN: Baruch Gouty, RN, BSN, Rita 1941/05/30 [phone] y.o. Other Clinician: Date of Birth/Sex: Female) Treating ROBSON, MICHAEL Primary Care Physician: Margarita Rana Physician/Extender: G Referring Physician: [name] in Treatment: 3 Vital Signs Time Taken: 14:24 Temperature (F): 97.2 Height (in): 65 Pulse (bpm): 69 Weight (lbs): 134 Respiratory Rate (breaths/min): 16 Body Mass Index (BMI): 22.3 Blood Pressure (mmHg): 143/69 Reference Range: 80 - 120 mg / dl Electronic Signature(s) Signed: 01/02/2016 5:19:00 PM By: Regan Lemming BSN, RN Entered By: Regan Lemming on 01/02/2016 14:24:50

## 2016-01-03 NOTE — Progress Notes (Signed)
Gynecologic Oncology Consult Visit   Referring Provider: Dr Genevive Bi  Chief Concern: PET positive ovary found after CT scan for lung cancer screening.  Subjective:  [name] [name] is a 75 y.o. female who is seen in consultation from Dr. Genevive Bi for PET positive left ovary.  She returns today for follow up.  CA125 drawn today.  Pelvic US 01/01/16 IMPRESSION: 10 mm left ovarian cyst/follicle, poorly visualized. In a postmenopausal female, annual follow-up pelvic ultrasound is suggested.  Seen by Dr Faith Rogue 12/07/15 "She did have a follow-up CT scan which have independently reviewed. The nodules in question appear smaller or completely resolved. I see no new significant findings. 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  Oncology History She presented to Dr Genevive Bi with a right lower lobe mass. She is a smoker and had a screening CT scan showing a 1 cm lesion in the right lower lobe highly suspicious for a malignancy. She did have some pulmonary function studies done which I have independently reviewed. This reveals an FEV1 of 73% and a DLCO of 48%. She has an extensive history of smoking and severe bilateral upper lobe emphysema. She was scheduled to have a CT-guided needle biopsy and the radiologist was quite concerned about the ability to make a diagnosis on transthoracic needle biopsy without complications. Therefore this approach was abandoned. She states that she was able to walk up a flight of stairs today but was quite winded at the top. She denied any recent fevers or chills. She does not smoke any longer.    PET last month showed Interval decreased size of the 1.0 cm right lower lobe pulmonary nodule described on the 08/01/2015 chest CT study, now 0.5 cm, which demonstrates no significant metabolism, in keeping with a resolving benign inflammatory nodule.  Plan is for  further follow up, as this is reassuring and she is not a good surgical candidate. Follow up PET/CT last month also showed a complex partially cystic and partially calcified 1.5 cm left ovarian mass (series 3/image 199) with associated hypermetabolism (max SUV 4.9), which is mildly increased in size from 1.1 cm on 12/20/2008 CT abdomen/pelvis. No abnormal free fluid in the pelvis.  Patient denies any pelvic complaints.  No prior Gyn surgery.  PET/CT 09/04/15 IMPRESSION: 1. Interval decreased size of the 1.0 cm right lower lobe pulmonary nodule described on the 08/01/2015 chest CT study, now 0.5 cm, which demonstrates no significant metabolism, in keeping with a resolving benign inflammatory nodule. 2. Three additional 0.4 cm pulmonary nodules, which are stable or new since 08/01/2015, none of which demonstrate significant metabolism, however which are below PET resolution. 3. Mildly hypermetabolic AP window mediastinal lymph node, which is stable in size since 08/01/2015 and nonspecific. A follow-up chest CT with IV contrast is recommended in 3 months, at which time the pulmonary nodules can also be reassessed. 4. Mildly hypermetabolic small left ovarian mass with cystic and calcified components, which is mildly increased in size since 12/20/2008 CT. A left ovarian neoplasm cannot be excluded. Consider further evaluation with transabdominal/transvaginal pelvic sonography and/or pelvic MRI with and without intravenous contrast.  5. Additional chronic findings including small left adrenal adenoma, moderate to severe centrilobular emphysema and chronic paranasal sinusitis.   Problem List: Patient Active Problem List   Diagnosis Date Noted  . COPD (chronic obstructive pulmonary disease) ([place]) 12/04/2015  .  Ovarian mass, left 10/04/2015  . Osteoporosis 09/05/2015  . Skin lesion 08/01/2015  . Chronic infection of sinus 06/30/2015  . Cramp in limb 06/30/2015  . H/O varicella 06/30/2015  . Other skin  changes 06/30/2015  . Extremity pain 06/30/2015  . Encounter for general adult medical examination without abnormal findings 06/30/2015  . Lung nodule, solitary 06/30/2015  . Infection of urinary tract 06/30/2015  . Chronic obstructive pulmonary disease with acute exacerbation ([place]) 06/30/2015  . Pneumonia due to infectious agent 06/30/2015  . Acute infection of nasal sinus 06/30/2015  . Chronic obstructive pulmonary disease ([place]) 05/29/2015  . Allergic rhinitis 05/26/2015  . Bilateral cataracts 05/26/2015  . 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 H/O respiratory system disease 05/26/2015  . Hypercholesteremia 05/26/2015  . Adult hypothyroidism 05/26/2015  . Cramps of lower extremity 05/26/2015  . Hemorrhoids, internal 05/26/2015  . Cervical pain 05/26/2015  . Osteopenia 05/26/2015  . 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 Acquired trigger finger 05/26/2015  . Disease of thyroid gland 05/26/2015  . Spasm 05/26/2015  . Diverticulitis 05/26/2015    Past Medical History: Past Medical History  Diagnosis Date  . Allergy   . Thyroid disease   . Hyperlipidemia   . PVC (premature ventricular contraction)   . GERD (gastroesophageal reflux disease)   . Dysrhythmia     PVC's  . COPD (chronic obstructive pulmonary disease) ([place])   . Shortness of breath dyspnea   . Asthma   . Pneumonia   . Hypothyroidism     Past Surgical History: Past Surgical History  Procedure Laterality Date  . Cholecystectomy  03/2004  . 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 Patelectomy Right   . Removal of medical meniscus  1972  . Laparotomy  1969    fertility testing  . Dilation and curettage of uterus  1968    fertility testing  . Appendectomy    . Abdominal surgery    . Blepharoplasty Bilateral 11/2014    left eyelid 08/03/2015 Dr. Vickki Muff  . Breast cyst aspiration Left   . Eye surgery Bilateral 2015    cataract excision    OB History:  OB History   No data available    Family History: Family History  Problem Relation Age of Onset  . Diabetes Mother   . Hypertension Mother   . Congestive Heart Failure Mother   . Heart disease Father   . Hypertension Father   . Hyperlipidemia Sister   . Heart disease Sister   . Paget's disease of bone Sister   . Hyperthyroidism Sister   . Cancer Sister     Social History: Social History   Social History  . Marital Status: Married    Spouse Name: N/A  . Number of Children: N/A  . Years of Education: N/A   Occupational History  . Not on file.   Social History Main Topics  . Smoking status: Former Smoker    Quit date: 08/25/1998  . Smokeless tobacco: Never Used  . Alcohol Use: 1.2 oz/week    2 Glasses of wine per week     Comment: occasionally  . Drug Use: No  . Sexual Activity: Not on file   Other Topics Concern  . Not on file   Social History Narrative    Allergies: Allergies  Allergen Reactions  . Codeine   . Diphenhydramine     Decreased BP and pulse rate  . Montelukast Sodium     Insomnia  .  Morphine Sulfate   . Nsaids     Can take Meloxicam, Naproxen  . Tramadol     Mental status change  . Celecoxib Rash    Epigastric pain  . Rofecoxib Rash    Epigastric pain    Current Medications: Current Outpatient Prescriptions  Medication Sig Dispense Refill  . Acetaminophen 500 MG coapsule Take 1 capsule by mouth every 6 (six) hours as needed.     Marland Kitchen albuterol (VENTOLIN HFA) 108 (90 Base) MCG/ACT inhaler Inhale 1-2 puffs into the lungs every 4 (four) hours as needed for wheezing or shortness of breath. Reported on 12/04/2015 1 Inhaler 12  . aspirin 81 MG tablet Take 81 mg by mouth daily.     . fexofenadine (ALLEGRA) 180 MG tablet Take 1 tablet by mouth daily.    . flecainide (TAMBOCOR) 50 MG tablet Take 50 mg by mouth once.     . Fluticasone-Salmeterol (ADVAIR DISKUS) 250-50 MCG/DOSE AEPB Inhale 1 puff into the lungs 2 (two) times daily. 1 puff BID daily 180 each 3   . levothyroxine (SYNTHROID, LEVOTHROID) 75 MCG tablet Take 1 tablet (75 mcg total) by mouth daily. 90 tablet 3  . MULTIPLE VITAMIN PO Take 1 tablet by mouth daily.    Marland Kitchen omeprazole (PRILOSEC) 20 MG capsule Take 1 capsule by mouth as needed. Reported on 12/04/2015    . sennosides-docusate sodium (SENOKOT-S) 8.6-50 MG tablet Take 1 tablet by mouth daily.    Marland Kitchen tiotropium (SPIRIVA HANDIHALER) 18 MCG inhalation capsule Place 1 capsule (18 mcg total) into inhaler and inhale daily. 90 capsule 3  . triamcinolone (NASACORT ALLERGY 24HR) 55 MCG/ACT AERO nasal inhaler Place 2 sprays into the nose daily.     No current facility-administered medications for this visit.    Review of Systems Per HPI  Objective:  Physical Examination:  BP 130/76 mmHg  Pulse 68  Temp(Src) 98.2 F (36.8 C) (Tympanic)  Wt 133 lb 2.5 oz (60.4 kg)   ECOG Performance Status: 1 - Symptomatic but completely ambulatory  General appearance: alert, cooperative and appears stated age HEENT:PERRLA and thyroid without masses Lymph node survey: non-palpable, axillary, inguinal, supraclavicular Cardiovascular: regular rate and rhythm, no murmurs or gallops Respiratory: normal air entry, lungs clear to auscultation Abdomen: soft, non-tender, without masses or organomegaly, no hernias and well healed incision Back: inspection of back is normal Extremities: extremities normal, atraumatic, no cyanosis or edema Skin exam - normal coloration and turgor, no rashes, no suspicious skin lesions noted. Neurological exam reveals alert, oriented, normal speech, no focal findings or movement disorder noted.  Pelvic:deferred today    Assessment:  LAVONYA LANGER is a 75 y.o. female diagnosed with 1.5 cm left ovarian PET positive lesion found on PET/CT done for follow up of lung nodule on screening CT scan performed because she is a former smoker.  The lesion in the ovary was noted on a CT scan in 2010 and is only slightly larger. Most likely  this is an incidental finding even though it is mildly PET positive.  Recent ultrasound was reassuring.  Plan:   Problem List Items Addressed This Visit      Other   Ovarian mass, left - Primary       We will also check a CA125 today.  These results will likely be reassuring and the ovarian mass can be followed on ultrasound in one year per radiology suggestion by Dr Faith Rogue.   The patient's diagnosis, an outline of the further diagnostic and laboratory studies  which will be required, the recommendation, and alternatives were discussed.  All questions were answered to the patient's satisfaction.  Mellody Drown, MD   CC:  Margarita Rana, MD [address] [place], [place] [address] [phone]

## 2016-01-03 NOTE — Progress Notes (Signed)
AZALEYA, STROUSS ([id number]) Visit Report for 01/02/2016 Chief Complaint Document Details Patient Name: [name], [name] Date of Service: 01/02/2016 2:15 PM Medical Record Patient Account Number: [id number] Number: Treating RN: Baruch Gouty, RN, BSN, Rita 07-Apr-1941 [phone] y.o. Other Clinician: Date of Birth/Sex: Female) Treating ROBSON, MICHAEL Primary Care Physician/Extender: Lonn Georgia Physician: Referring Physician: [name] in Treatment: 3 Information Obtained from: Patient Chief Complaint Patient is here for a chronic wound on her right anterior lateral leg Electronic Signature(s) Signed: 01/03/2016 7:54:52 AM By: Linton Ham MD Entered By: Linton Ham on 01/02/2016 14:40:50 [name], [name] ([id number]) -------------------------------------------------------------------------------- Debridement Details Patient Name: [name], [name] Date of Service: 01/02/2016 2:15 PM Medical Record Patient Account Number: [id number] Number: Treating RN: Baruch Gouty, RN, BSN, Rita September 01, 1940 [phone] y.o. Other Clinician: Date of Birth/Sex: Female) Treating ROBSON, MICHAEL Primary Care Physician/Extender: Lonn Georgia Physician: Referring Physician: [name] in Treatment: 3 Debridement Performed for Wound #1 Right,Lateral Lower Leg Assessment: Performed By: Physician Ricard Dillon, MD Debridement: Debridement Pre-procedure Yes Verification/Time Out Taken: Start Time: 14:32 Pain Control: Lidocaine 4% Topical Solution Level: Skin/Subcutaneous Tissue Total Area Debrided (L x 1 (cm) x 1 (cm) = 1 (cm) W): Tissue and other Non-Viable, Eschar, Exudate, Fibrin/Slough, Subcutaneous material debrided: Instrument: Curette Bleeding: Minimum Hemostasis Achieved: Pressure End Time: 14:34 Procedural Pain: 0 Post Procedural Pain: 0 Response to Treatment: Procedure was tolerated well Post Debridement Measurements of Total Wound Length: (cm) 1 Width:  (cm) 1 Depth: (cm) 0.2 Volume: (cm) 0.157 Post Procedure Diagnosis Same as Pre-procedure Electronic Signature(s) Signed: 01/02/2016 5:19:00 PM By: Regan Lemming BSN, RN Signed: 01/03/2016 7:54:52 AM By: Linton Ham MD Entered By: Linton Ham on 01/02/2016 14:40:14 [name], [name] ([id number]) [name], [name] ([id number]) -------------------------------------------------------------------------------- Debridement Details Patient Name: [name], [name] Date of Service: 01/02/2016 2:15 PM Medical Record Patient Account Number: [id number] Number: Treating RN: Baruch Gouty, RN, BSN, Rita [birth year] [phone] y.o. Other Clinician: Date of Birth/Sex: Female) Treating ROBSON, MICHAEL Primary Care Physician/Extender: Lonn Georgia Physician: Referring Physician: [name] in Treatment: 3 Debridement Performed for Wound #2 Right,Distal Lower Leg Assessment: Performed By: Physician Ricard Dillon, MD Debridement: Debridement Pre-procedure Yes Verification/Time Out Taken: Start Time: 14:34 Pain Control: Lidocaine 4% Topical Solution Level: Skin/Subcutaneous Tissue Total Area Debrided (L x 1.4 (cm) x 1.5 (cm) = 2.1 (cm) W): Tissue and other Non-Viable, Eschar, Exudate, Fibrin/Slough, Subcutaneous material debrided: Instrument: Curette Bleeding: Minimum Hemostasis Achieved: Pressure End Time: 14:36 Procedural Pain: 0 Post Procedural Pain: 0 Response to Treatment: Procedure was tolerated well Post Debridement Measurements of Total Wound Length: (cm) 1.4 Width: (cm) 1.5 Depth: (cm) 0.1 Volume: (cm) 0.165 Post Procedure Diagnosis Same as Pre-procedure Electronic Signature(s) Signed: 01/02/2016 5:19:00 PM By: Regan Lemming BSN, RN Signed: 01/03/2016 7:54:52 AM By: Linton Ham MD Entered By: Linton Ham on 01/02/2016 14:40:37 [name], [name] [name], [name]  ([id number]) -------------------------------------------------------------------------------- HPI Details Patient Name: [name], [name] Date of Service: 01/02/2016 2:15 PM Medical Record Patient Account Number: [id number] Number: Treating RN: Baruch Gouty, RN, BSN, Rita 09-03-1940 [phone] y.o. Other Clinician: Date of Birth/Sex: Female) Treating ROBSON, MICHAEL Primary Care Physician/Extender: Lonn Georgia Physician: Referring Physician: [name] in Treatment: 3 History of Present Illness HPI Description: 12/06/15; this is a patient who has no prior wound history and is not a diabetic. In February she was getting into her family truck and the wind pushed the door against the outer aspect of her right lower leg. I  believe there was initially some swelling but not clearly a hematoma. 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 Has been using topical antibiotics on this. 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 There is already been some improvement. She has no prior history of PAD and no major history of venous insufficiency. 12/13/15; wound appears healthy, using prisma 12/20/15; patient arrives complaining of increasing pain around the wound. She also had a blister under the wound. 12/26/15; the patient arrives with the blister last week fully excised and healed however she has another floppy flaccid blister just below her wound. The wound does not appear to be infected. The cause of this is not really clear, he does walk 3 miles a day but doesn't think that the area with a blister is currently rubs the bandage at all. She said she felt a stinging and then noticed this on the weekend. She will complete the antibiotics I gave hertomorrow 01/02/16; the patient's original wound in the right lower leg requires debridement of surface slough and surrounding eschar. The base of this then looks healthy. The area that was a blister underneath this  of uncertain etiology also appears to be better. Culture of this blister from last week was negative Electronic Signature(s) Signed: 01/03/2016 7:54:52 AM By: Linton Ham MD Entered By: Linton Ham on 01/02/2016 14:41:43 [name], [name] ([id number]) -------------------------------------------------------------------------------- Physical Exam Details Patient Name: [name], [name] Date of Service: 01/02/2016 2:15 PM Medical Record Patient Account Number: [id number] Number: Treating RN: Baruch Gouty, RN, BSN, Rita Oct 26, 1940 [phone] y.o. Other Clinician: Date of Birth/Sex: Female) Treating ROBSON, MICHAEL Primary Care Physician/Extender: Lonn Georgia Physician: Referring Physician: [name] in Treatment: 3 Notes Wound exam; both wounds were debridement the original wound and the lower blister. Both of these appear to be healthy. Will change the dressing to silver collagen under kerlix wrap it. There is no evidence of surrounding infection Electronic Signature(s) Signed: 01/03/2016 7:54:52 AM By: Linton Ham MD Entered By: Linton Ham on 01/02/2016 14:42:33 [name], [name] ([id number]) -------------------------------------------------------------------------------- Physician Orders Details Patient Name: Wheller, Daniel L. Date of Service: 01/02/2016 2:15 PM Medical Record Patient Account Number: [id number] Number: Treating RN: Baruch Gouty, RN, BSN, Rita 09-26-1940 [phone] y.o. Other Clinician: Date of Birth/Sex: Female) Treating ROBSON, MICHAEL Primary Care Physician/Extender: Lonn Georgia Physician: Referring Physician: [name] in Treatment: 3 Verbal / Phone Orders: Yes Clinician: Afful, RN, BSN, Rita Read Back and Verified: Yes Diagnosis Coding Wound Cleansing Wound #1 Right,Lateral Lower Leg o Cleanse wound with mild soap and water o May Shower, gently pat wound dry prior to applying new dressing. o May shower with  protection. Wound #2 Right,Distal Lower Leg o Cleanse wound with mild soap and water o May Shower, gently pat wound dry prior to applying new dressing. o May shower with protection. Anesthetic Wound #1 Right,Lateral Lower Leg o Topical Lidocaine 4% cream applied to wound bed prior to debridement Wound #2 Right,Distal Lower Leg o Topical Lidocaine 4% cream applied to wound bed prior to debridement Skin Barriers/Peri-Wound Care Wound #1 Right,Lateral Lower Leg o Barrier cream Wound #2 Right,Distal Lower Leg o Barrier cream Primary Wound Dressing Wound #1 Right,Lateral Lower Leg o Prisma Ag Wound #2 Right,Distal Lower Leg o Prisma Ag Secondary Dressing [name], [name] ([id number]) Wound #1 Right,Lateral Lower Leg o Gauze and Kerlix/Conform Wound #2 Right,Distal Lower Leg o Gauze and Kerlix/Conform Dressing Change Frequency Wound #1 Right,Lateral Lower Leg o Change dressing  every day. Wound #2 Right,Distal Lower Leg o Change dressing every day. Follow-up Appointments Wound #1 Right,Lateral Lower Leg o Return Appointment in 1 week. Wound #2 Right,Distal Lower Leg o Return Appointment in 1 week. Additional Orders / Instructions Wound #1 Right,Lateral Lower Leg o Increase protein intake. o Activity as tolerated Wound #2 Right,Distal Lower Leg o Increase protein intake. o Activity as tolerated Electronic Signature(s) Signed: 01/02/2016 5:19:00 PM By: Regan Lemming BSN, RN Signed: 01/03/2016 7:54:52 AM By: Linton Ham MD Entered By: Regan Lemming on 01/02/2016 14:37:06 [name], [name] -------------------------------------------------------------------------------- Problem List Details Patient Name: Pedley, Annasofia L. Date of Service: 01/02/2016 2:15 PM Medical Record Patient Account Number: [id number] Number: Treating RN: Baruch Gouty, RN, BSN, Rita November 14, 1940 [phone] y.o. 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 -------------------------------------------------------------------------------- Progress Note Details Patient Name: [name], [name] Date of Service: 01/02/2016 2:15 PM Medical Record Patient Account Number: [id number] Number: Treating RN: Baruch Gouty, RN, BSN, Rita [birth year] [phone] y.o. Other Clinician: Date of Birth/Sex: Female) Treating ROBSON, MICHAEL Primary Care Physician/Extender: Lonn [place] Physician: Referring Physician: [name] in Treatment: 3 Subjective Chief Complaint Information obtained from Patient Patient is here for a chronic wound on her right anterior lateral leg History of Present Illness (HPI) 12/06/15; this is a patient who has no prior wound history and is not a diabetic. In February she was getting into her family truck and the wind pushed the door against the outer aspect of her right lower leg. I believe there was initially some swelling but not clearly a hematoma. 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 Has been using topical antibiotics on this. 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 There is already been some improvement. She has no prior history of PAD and no major history of venous insufficiency. 12/13/15; wound appears healthy, using prisma 12/20/15; patient arrives complaining of increasing pain around the wound. She also had a blister under  the wound. 12/26/15; the patient arrives with the blister last week fully excised and healed however she has another floppy flaccid blister just below her wound. The wound does not appear to be infected. The cause of this is not really clear, he does walk 3 miles a day but doesn't think that the area with a blister is currently rubs the bandage at all. She said she felt a stinging and then noticed this on the weekend. She will complete the antibiotics I gave hertomorrow 01/02/16; the patient's original wound in the right lower leg requires debridement of surface slough and surrounding eschar. The base of this then looks healthy. The area that was a blister underneath this of uncertain etiology also appears to be better. Culture of this blister from last week was negative Objective Constitutional Vitals Time Taken: 2:24 PM, Height: 65 in, Weight: 134 lbs, BMI: 22.3, Temperature: 97.2 F, Pulse: 69 bpm, Respiratory Rate: 16 breaths/min, Blood Pressure: 143/69 mmHg. [name], [name] Integumentary (Hair, Skin) Wound #1 status is Open. Original cause of wound was Trauma. The wound is located on  the Right,Lateral Lower Leg. The wound measures 1cm length x 1cm width x 0.2cm depth; 0.785cm^2 area and 0.157cm^3 volume. The wound is limited to skin breakdown. There is no tunneling or undermining noted. There is a small amount of serosanguineous drainage noted. The wound margin is distinct with the outline attached to the wound base. There is large (67-100%) pink, pale granulation within the wound bed. There is a small (1-33%) amount of necrotic tissue within the wound bed including Adherent Slough. The periwound skin appearance exhibited: Localized Edema, Moist. The periwound skin appearance did not exhibit: Callus, Crepitus, Excoriation, Fluctuance, Friable, Induration, Rash, Scarring, Dry/Scaly, Maceration, Atrophie Blanche, Cyanosis, Ecchymosis, Hemosiderin Staining, Mottled, Pallor, Rubor,  Erythema. Periwound temperature was noted as No Abnormality. The periwound has tenderness on palpation. Wound #2 status is Open. Original cause of wound was Blister. The wound is located on the Right,Distal Lower Leg. The wound measures 1.4cm length x 1.5cm width x 0.1cm depth; 1.649cm^2 area and 0.165cm^3 volume. The wound is limited to skin breakdown. There is no tunneling or undermining noted. There is a small amount of serosanguineous drainage noted. The wound margin is distinct with the outline attached to the wound base. There is medium (34-66%) pink, pale granulation within the wound bed. There is a medium (34-66%) amount of necrotic tissue within the wound bed including Adherent Slough. The periwound skin appearance exhibited: Localized Edema, Moist. The periwound skin appearance did not exhibit: Callus, Crepitus, Excoriation, Fluctuance, Friable, Induration, Rash, Scarring, Dry/Scaly, Maceration, Atrophie Blanche, Cyanosis, Ecchymosis, Hemosiderin Staining, Mottled, Pallor, Rubor, Erythema. Periwound temperature was noted as No Abnormality. The periwound has tenderness on palpation. 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 There was a Skin/Subcutaneous Tissue Debridement [id number]) debridement with total area of 1 sq cm performed by Ricard Dillon, MD. with the following instrument(s): Curette to remove Non-Viable tissue/material including Exudate, Fibrin/Slough, Eschar, and Subcutaneous after achieving pain control using Lidocaine 4% Topical Solution. A time out was conducted prior to the start of the procedure. A Minimum amount of bleeding was controlled with Pressure. The procedure was tolerated well with a pain level of 0 throughout and a pain level of 0 following the  procedure. Post Debridement Measurements: 1cm length x 1cm width x 0.2cm depth; Arcia, Tamy L. ([id number]) 0.157cm^3 volume. Post procedure Diagnosis Wound #1: Same as Pre-Procedure Wound #2 Wound #2 is a Trauma, Other located on the Right,Distal Lower Leg . There was a Skin/Subcutaneous Tissue Debridement [id number]) debridement with total area of 2.1 sq cm performed by Ricard Dillon, MD. with the following instrument(s): Curette to remove Non-Viable tissue/material including Exudate, Fibrin/Slough, Eschar, and Subcutaneous after achieving pain control using Lidocaine 4% Topical Solution. A time out was conducted prior to the start of the procedure. A Minimum amount of bleeding was controlled with Pressure. The procedure was tolerated well with a pain level of 0 throughout and a pain level of 0 following the procedure. Post Debridement Measurements: 1.4cm length x 1.5cm width x 0.1cm depth; 0.165cm^3 volume. Post procedure Diagnosis Wound #2: Same as Pre-Procedure Plan Wound Cleansing: Wound #1 Right,Lateral Lower Leg: Cleanse wound with mild soap and water May Shower, gently pat wound dry prior to applying new dressing. May shower with protection. Wound #2 Right,Distal Lower Leg: Cleanse wound with mild soap and water May Shower, gently pat wound  dry prior to applying new dressing. May shower with protection. Anesthetic: Wound #1 Right,Lateral Lower Leg: Topical Lidocaine 4% cream applied to wound bed prior to debridement Wound #2 Right,Distal Lower Leg: Topical Lidocaine 4% cream applied to wound bed prior to debridement Skin Barriers/Peri-Wound Care: Wound #1 Right,Lateral Lower Leg: Barrier cream Wound #2 Right,Distal Lower Leg: Barrier cream Primary Wound Dressing: Wound #1 Right,Lateral Lower Leg: Prisma Ag Wound #2 Right,Distal Lower Leg: Prisma Ag Secondary Dressing: Wound #1 Right,Lateral Lower Leg: Gauze and Kerlix/Conform Wound #2 Right,Distal Lower  Leg: Nakanishi, Tenaya L. ([id number]) Gauze and Kerlix/Conform Dressing Change Frequency: Wound #1 Right,Lateral Lower Leg: Change dressing every day. Wound #2 Right,Distal Lower Leg: Change dressing every day. Follow-up Appointments: Wound #1 Right,Lateral Lower Leg: Return Appointment in 1 week. Wound #2 Right,Distal Lower Leg: Return Appointment in 1 week. Additional Orders / Instructions: Wound #1 Right,Lateral Lower Leg: Increase protein intake. Activity as tolerated Wound #2 Right,Distal Lower Leg: Increase protein intake. Activity as tolerated #1 we'll use Prisma to both wound areas under Kerlix and Conform Electronic Signature(s) Signed: 01/03/2016 7:54:52 AM By: Linton Ham MD Entered By: Linton Ham on 01/02/2016 14:43:37 Tseng, Keerthi L. ([id number]) -------------------------------------------------------------------------------- SuperBill Details Patient Name: Garant, Aylyn L. Date of Service: 01/02/2016 Medical Record Patient Account Number: [id number] Number: Treating RN: Baruch Gouty, RN, BSN, Rita Feb 09, 1941 [phone] y.o. Other Clinician: Date of Birth/Sex: Female) Treating ROBSON, MICHAEL Primary Care Physician/Extender: Lonn [place] Physician: Suella [place] in Treatment: 3 Referring Physician: Margarita Rana Diagnosis Coding ICD-10 Codes Code Description [phone] Non-pressure chronic ulcer of right calf with necrosis of muscle I87.2 Venous insufficiency (chronic) (peripheral) Facility Procedures CPT4 Code Description: [id number] 11042 - DEB SUBQ TISSUE 20 SQ CM/< ICD-10 Description Diagnosis L97.213 Non-pressure chronic ulcer of right calf with necro Modifier: sis of muscl Quantity: 1 e Physician Procedures CPT4 Code Description: [id number] 11042 - WC PHYS SUBQ TISS 20 SQ CM ICD-10 Description Diagnosis L97.213 Non-pressure chronic ulcer of right calf with necro Modifier: sis of muscle Quantity: 1 Electronic Signature(s) Signed: 01/03/2016 7:54:52 AM By: Linton Ham MD Entered By: Linton Ham on 01/02/2016 14:44:00

## 2016-01-04 LAB — CA 125: CA 125: 14.6 U/mL (ref 0.0–38.1)

## 2016-01-09 ENCOUNTER — Encounter: Payer: PPO | Admitting: Internal Medicine

## 2016-01-09 DIAGNOSIS — L97213 Non-pressure chronic ulcer of right calf with necrosis of muscle: Secondary | ICD-10-CM | POA: Diagnosis not present

## 2016-01-09 DIAGNOSIS — S81801A Unspecified open wound, right lower leg, initial encounter: Secondary | ICD-10-CM | POA: Diagnosis not present

## 2016-01-09 DIAGNOSIS — S80821A Blister (nonthermal), right lower leg, initial encounter: Secondary | ICD-10-CM | POA: Diagnosis not present

## 2016-01-10 NOTE — Progress Notes (Signed)
[name] Visit Report for 01/09/2016 Arrival Information Details Patient Name: [name] [name], [name] Date of Service: 01/09/2016 3:00 PM Medical Record Patient Account Number: [id number] Number: Treating RN: Baruch Gouty, RN, BSN, Velva Harman [birth year] [phone] y.o. Other Clinician: Date of Birth/Sex: Female) Treating ROBSON, [place] Primary Care Physician: Margarita Rana Physician/Extender: G Referring Physician: [name] in Treatment: 4 Visit Information History Since Last Visit Added or deleted any medications: No Patient Arrived: Ambulatory Any new allergies or adverse reactions: No Arrival Time: 15:00 Signs or symptoms of abuse/neglect since last No Accompanied By: self visito Transfer Assistance: None Has Dressing in Place as Prescribed: Yes Patient Identification Verified: Yes Pain Present Now: No Secondary Verification Process Yes Completed: Patient Requires Transmission-Based No Precautions: Patient Has Alerts: No Electronic Signature(s) Signed: 01/10/2016 11:39:04 AM By: Regan Lemming BSN, RN Entered By: Regan Lemming on 01/09/2016 15:00:31 [name] -------------------------------------------------------------------------------- Encounter Discharge Information Details Patient Name: [name] [name], [name] Date of Service: 01/09/2016 3:00 PM Medical Record Patient Account Number: [id number] Number: Treating RN: Baruch Gouty, RN, BSN, Velva Harman June 09, 1941 [phone] y.o. Other Clinician: Date of Birth/Sex: Female) Treating ROBSON, MICHAEL Primary Care Physician: Margarita Rana Physician/Extender: G Referring Physician: [name] in Treatment: 4 Encounter Discharge Information Items Discharge Pain Level: 0 Discharge Condition: Stable Ambulatory Status: Ambulatory Discharge Destination: Home Private Transportation: Auto Accompanied By: self Schedule Follow-up Appointment: No Medication Reconciliation completed and No provided to  Patient/Care [name] [name]: Clinical Summary of Care: Electronic Signature(s) Signed: 01/10/2016 11:39:04 AM By: Regan Lemming BSN, RN Entered By: Regan Lemming on 01/09/2016 15:24:21 [name] -------------------------------------------------------------------------------- Lower Extremity Assessment Details Patient Name: [name] [name], [name] Date of Service: 01/09/2016 3:00 PM Medical Record Patient Account Number: [id number] Number: Treating RN: Baruch Gouty, RN, BSN, Velva Harman 10-01-1940 [phone] y.o. Other Clinician: Date of Birth/Sex: Female) Treating ROBSON, [place] Primary Care Physician: Margarita Rana Physician/Extender: G Referring Physician: [name] in Treatment: 4 Edema Assessment Assessed: [Left: No] [Right: No] E[Left: dema] [Right: :] Calf Left: Right: Point of Measurement: 32 cm From Medial Instep cm 35 cm Ankle Left: Right: Point of Measurement: 7 cm From Medial Instep cm 21 cm Vascular Assessment Pulses: Posterior Tibial Dorsalis Pedis Palpable: [Right:Yes] Extremity colors, hair growth, and conditions: Extremity Color: [Right:Mottled] Hair Growth on Extremity: [Right:No] Temperature of Extremity: [Right:Warm] Capillary Refill: [Right:< 3 seconds] Electronic Signature(s) Signed: 01/10/2016 11:39:04 AM By: Regan Lemming BSN, RN Entered By: Regan Lemming on 01/09/2016 15:02:10 [name] -------------------------------------------------------------------------------- Multi Wound Chart Details Patient Name: [name] [name], [name] Date of Service: 01/09/2016 3:00 PM Medical Record Patient Account Number: [id number] Number: Treating RN: Baruch Gouty, RN, BSN, Velva Harman 07-Sep-1940 [phone] y.o. Other Clinician: Date of Birth/Sex: Female) Treating ROBSON, MICHAEL Primary Care Physician: Margarita Rana Physician/Extender: G Referring Physician: [name] in Treatment: 4 Vital Signs Height(in): 65 Pulse(bpm): 74 Weight(lbs): 134 Blood  Pressure 130/50 (mmHg): Body Mass Index(BMI): 22 Temperature(F): 97.7 Respiratory Rate 16 (breaths/min): Photos: [1:No Photos] [2:No Photos] [N/A:N/A] Wound Location: [1:Right Lower Leg - Lateral] [2:Right Lower Leg - Distal] [N/A:N/A] Wounding Event: [1:Trauma] [2:Blister] [N/A:N/A] Primary Etiology: [1:Skin Tear] [2:Trauma, Other] [N/A:N/A] Comorbid History: [1:Cataracts, Chronic Obstructive Pulmonary Disease (COPD), Hypertension, Osteoarthritis, Neuropathy, Confinement Anxiety] [2:Cataracts, Chronic Obstructive Pulmonary Disease (COPD), Hypertension, Osteoarthritis, Neuropathy, Confinement  Anxiety] [N/A:N/A] Date Acquired: [1:10/11/2015] [2:12/13/2015] [N/A:N/A] Weeks of Treatment: [1:4] [2:3] [N/A:N/A] Wound Status: [1:Open] [2:Open] [N/A:N/A] Measurements [name] x W x D 0.5x0.5x0.2 [2:1x1x0.1] [N/A:N/A] (cm) Area (cm) : [1:0.196] [2:0.785] [N/A:N/A] Volume (cm) : [1:0.039] [2:0.079] [N/A:N/A] %  Reduction in Area: [1:93.80%] [2:28.60%] [N/A:N/A] % Reduction in Volume: 87.60% [2:28.20%] [N/A:N/A] Classification: [1:Full Thickness Without Exposed Support Structures] [2:Full Thickness Without Exposed Support Structures] [N/A:N/A] Exudate Amount: [1:Small] [2:Small] [N/A:N/A] Exudate Type: [1:Serosanguineous] [2:Serosanguineous] [N/A:N/A] Exudate Color: [1:red, brown] [2:red, brown] [N/A:N/A] Wound Margin: [1:Distinct, outline attached] [2:Distinct, outline attached] [N/A:N/A] Granulation Amount: [1:Medium (34-66%)] [2:Large (67-100%)] [N/A:N/A] Granulation Quality: Pink, Pale Pink, Pale N/A Necrotic Amount: Small (1-33%) Small (1-33%) N/A Exposed Structures: Fascia: No Fascia: No N/A Fat: No Fat: No Tendon: No Tendon: No Muscle: No Muscle: No Joint: No Joint: No Bone: No Bone: No Limited to Skin Limited to Skin Breakdown Breakdown Epithelialization: Large (67-100%) Medium (34-66%) N/A Periwound Skin Texture: Edema: Yes Edema: Yes N/A Excoriation: No Excoriation:  No Induration: No Induration: No Callus: No Callus: No Crepitus: No Crepitus: No Fluctuance: No Fluctuance: No Friable: No Friable: No Rash: No Rash: No Scarring: No Scarring: No Periwound Skin Moist: Yes Moist: Yes N/A Moisture: Maceration: No Maceration: No Dry/Scaly: No Dry/Scaly: No Periwound Skin Color: Atrophie Blanche: No Atrophie Blanche: No N/A Cyanosis: No Cyanosis: No Ecchymosis: No Ecchymosis: No Erythema: No Erythema: No Hemosiderin Staining: No Hemosiderin Staining: No Mottled: No Mottled: No Pallor: No Pallor: No Rubor: No Rubor: No Temperature: No Abnormality No Abnormality N/A Tenderness on Yes Yes N/A Palpation: Wound Preparation: Ulcer Cleansing: Ulcer Cleansing: Other: N/A Rinsed/Irrigated with water and soap Saline Topical Anesthetic Topical Anesthetic Applied: Other: lidociane Applied: Other: lidocaine 4% 4% Treatment Notes Electronic Signature(s) Signed: 01/10/2016 11:39:04 AM By: Regan Lemming BSN, RN Entered By: Regan Lemming on 01/09/2016 15:14:11 [name] [name], [name] LMarland Kitchen ([id number]) -------------------------------------------------------------------------------- [place]ermit Details Patient Name: [name] [name], [name] Date of Service: 01/09/2016 3:00 PM Medical Record Patient Account Number: [id number] Number: Treating RN: Baruch Gouty, RN, BSN, Velva Harman August 09, 1941 [phone] y.o. Other Clinician: Date of Birth/Sex: Female) Treating ROBSON, MICHAEL Primary Care Physician: Margarita Rana Physician/Extender: G Referring Physician: [name] in Treatment: 4 Active Inactive Orientation to the Wound Care Program Nursing Diagnoses: Knowledge deficit related to the wound healing center program Goals: Patient/caregiver will verbalize understanding of the [place] Program Date Initiated: 12/06/2015 Goal Status: Active Interventions: Provide education on orientation to the wound center Notes: Venous Leg  Ulcer Nursing Diagnoses: Knowledge deficit related to disease process and management Potential for venous Insuffiency (use before diagnosis confirmed) Goals: Non-invasive venous studies are completed as ordered Date Initiated: 12/06/2015 Goal Status: Active Patient will maintain optimal edema control Date Initiated: 12/06/2015 Goal Status: Active Patient/caregiver will verbalize understanding of disease process and disease management Date Initiated: 12/06/2015 Goal Status: Active Verify adequate tissue perfusion prior to therapeutic compression application Date Initiated: 12/06/2015 Goal Status: Active [name] [name], [name] Interventions: Assess peripheral edema status every visit. Compression as ordered Provide education on venous insufficiency Treatment Activities: Non-invasive vascular studies : 12/13/2015 Therapeutic compression applied : 12/13/2015 Notes: Wound/Skin Impairment Nursing Diagnoses: Impaired tissue integrity Knowledge deficit related to smoking impact on wound healing Knowledge deficit related to ulceration/compromised skin integrity Goals: Patient/caregiver will verbalize understanding of skin care regimen Date Initiated: 12/06/2015 Goal Status: Active Ulcer/skin breakdown will have a volume reduction of 30% by week 4 Date Initiated: 12/06/2015 Goal Status: Active Ulcer/skin breakdown will have a volume reduction of 50% by week 8 Date Initiated: 12/06/2015 Goal Status: Active Ulcer/skin breakdown will have a volume reduction of 80% by week 12 Date Initiated: 12/06/2015 Goal Status: Active Ulcer/skin breakdown will heal within 14 weeks Date Initiated: 12/06/2015 Goal Status: Active Interventions: Assess patient/caregiver ability to perform ulcer/skin  care regimen upon admission and as needed Assess ulceration(s) every visit Provide education on ulcer and skin care Treatment Activities: Referred to DME Cynthia Stainback for dressing supplies : 12/13/2015 Skin  care regimen initiated : 12/13/2015 Topical wound management initiated : 12/13/2015 [name] [name], [name] [name] ([id number]) Notes: Electronic Signature(s) Signed: 01/10/2016 11:39:04 AM By: Regan Lemming BSN, RN Entered By: Regan Lemming on 01/09/2016 15:09:57 [name] [name], [name] -------------------------------------------------------------------------------- Patient/Caregiver Education Details Patient Name: [name] [name], [name] Date of Service: 01/09/2016 3:00 PM Medical Record Patient Account Number: [id number] Number: Treating RN: Baruch Gouty, RN, BSN, Velva Harman 09/23/1940 [phone] y.o. Other Clinician: Date of Birth/Gender: Female) Treating ROBSON, MICHAEL Primary Care Physician: Margarita Rana Physician/Extender: G Referring Physician: [name] in Treatment: 4 Education Assessment Education Provided To: Patient Education Topics Provided Venous: Methods: Explain/Verbal Responses: State content correctly Welcome To [facility] [place]: Methods: Explain/Verbal Responses: State content correctly Wound/Skin Impairment: Methods: Explain/Verbal Responses: State content correctly Electronic Signature(s) Signed: 01/10/2016 11:39:04 AM By: Regan Lemming BSN, RN Entered By: Regan Lemming on 01/09/2016 15:24:36 [name] [name], [name] -------------------------------------------------------------------------------- Wound Assessment Details Patient Name: [name] [name], [name] Date of Service: 01/09/2016 3:00 PM Medical Record Patient Account Number: [id number] Number: Treating RN: Baruch Gouty, RN, BSN, Velva Harman [birth year] [phone] y.o. Other Clinician: Date of Birth/Sex: Female) Treating ROBSON, MICHAEL Primary Care Physician: Margarita Rana Physician/Extender: G Referring Physician: [name] in Treatment: 4 Wound Status Wound Number: 1 Primary Skin Tear Etiology: Wound Location: Right Lower Leg - Lateral Wound Open Wounding Event: Trauma Status: Date Acquired:  10/11/2015 Comorbid Cataracts, Chronic Obstructive Weeks Of Treatment: 4 History: Pulmonary Disease (COPD), Clustered Wound: No Hypertension, Osteoarthritis, Neuropathy, Confinement Anxiety Photos Photo Uploaded By: Regan Lemming on 01/09/2016 15:38:43 Wound Measurements Length: (cm) 0.5 Width: (cm) 0.5 Depth: (cm) 0.2 Area: (cm) 0.196 Volume: (cm) 0.039 % Reduction in Area: 93.8% % Reduction in Volume: 87.6% Epithelialization: Large (67-100%) Tunneling: No Undermining: No Wound Description Full Thickness Without Exposed Classification: Support Structures Wound Margin: Distinct, outline attached Exudate Small Amount: Exudate Type: Serosanguineous Exudate Color: red, brown Gjerde, Bianco [name] ([id number]) Foul Odor After Cleansing: No Wound Bed Granulation Amount: Medium (34-66%) Exposed Structure Granulation Quality: Pink, Pale Fascia Exposed: No Necrotic Amount: Small (1-33%) Fat Layer Exposed: No Necrotic Quality: Adherent Slough Tendon Exposed: No Muscle Exposed: No Joint Exposed: No Bone Exposed: No Limited to Skin Breakdown Periwound Skin Texture Texture Color No Abnormalities Noted: No No Abnormalities Noted: No Callus: No Atrophie Blanche: No Crepitus: No Cyanosis: No Excoriation: No Ecchymosis: No Fluctuance: No Erythema: No Friable: No Hemosiderin Staining: No Induration: No Mottled: No Localized Edema: Yes Pallor: No Rash: No Rubor: No Scarring: No Temperature / Pain Moisture Temperature: No Abnormality No Abnormalities Noted: No Tenderness on Palpation: Yes Dry / Scaly: No Maceration: No Moist: Yes Wound Preparation Ulcer Cleansing: Rinsed/Irrigated with Saline Topical Anesthetic Applied: Other: lidocaine 4%, Treatment Notes Wound #1 (Right, Lateral Lower Leg) 1. Cleansed with: Clean wound with Normal Saline 3. Peri-wound Care: Barrier cream 4. Dressing Applied: Prisma Ag 5. Secondary Dressing Applied Gauze and  Kerlix/Conform 7. Secured with Tape Electronic Signature(s) SLYVIA, CURIEL ([id number]) Signed: 01/10/2016 11:39:04 AM By: Regan Lemming BSN, RN Entered By: Regan Lemming on 01/09/2016 15:08:17 Depaul, Priscila [name] ([id number]) -------------------------------------------------------------------------------- Wound Assessment Details Patient Name: Landgrebe, Mohini [name] Date of Service: 01/09/2016 3:00 PM Medical Record Patient Account Number: [id number] Number: Treating RN: Baruch Gouty, RN, BSN, Velva Harman 01-28-1941 [phone] y.o. Other Clinician: Date of Birth/Sex: Female) Treating ROBSON, [place] Primary Care Physician: Margarita Rana  Physician/Extender: G Referring Physician: [name] in Treatment: 4 Wound Status Wound Number: 2 Primary Trauma, Other Etiology: Wound Location: Right, Distal Lower Leg Wound Open Wounding Event: Blister Status: Date Acquired: 12/13/2015 Comorbid Cataracts, Chronic Obstructive Weeks Of Treatment: 3 History: Pulmonary Disease (COPD), Clustered Wound: No Hypertension, Osteoarthritis, Neuropathy, Confinement Anxiety Photos Photo Uploaded By: Regan Lemming on 01/09/2016 15:39:08 Wound Measurements Length: (cm) 1.5 Width: (cm) 1 Depth: (cm) 0.1 Area: (cm) 1.178 Volume: (cm) 0.118 % Reduction in Area: -7.1% % Reduction in Volume: -7.3% Epithelialization: Medium (34-66%) Wound Description Full Thickness Without Exposed Classification: Support Structures Wound Margin: Distinct, outline attached Exudate Small Amount: Exudate Type: Serosanguineous Exudate Color: red, brown Hanselman, Ayaka [name] ([id number]) Foul Odor After Cleansing: No Wound Bed Granulation Amount: Large (67-100%) Exposed Structure Granulation Quality: Pink, Pale Fascia Exposed: No Necrotic Amount: Small (1-33%) Fat Layer Exposed: No Necrotic Quality: Adherent Slough Tendon Exposed: No Muscle Exposed: No Joint Exposed: No Bone Exposed: No Limited to Skin Breakdown Periwound  Skin Texture Texture Color No Abnormalities Noted: No No Abnormalities Noted: No Callus: No Atrophie Blanche: No Crepitus: No Cyanosis: No Excoriation: No Ecchymosis: No Fluctuance: No Erythema: No Friable: No Hemosiderin Staining: No Induration: No Mottled: No Localized Edema: Yes Pallor: No Rash: No Rubor: No Scarring: No Temperature / Pain Moisture Temperature: No Abnormality No Abnormalities Noted: No Tenderness on Palpation: Yes Dry / Scaly: No Maceration: No Moist: Yes Wound Preparation Ulcer Cleansing: Other: water and soap, Topical Anesthetic Applied: Other: lidociane 4%, Treatment Notes Wound #2 (Right, Distal Lower Leg) 1. Cleansed with: Clean wound with Normal Saline 3. Peri-wound Care: Barrier cream 4. Dressing Applied: Prisma Ag 5. Secondary Dressing Applied Gauze and Kerlix/Conform 7. Secured with Tape Electronic Signature(s) REIKA, PROSE ([id number]) Signed: 01/10/2016 11:39:04 AM By: Regan Lemming BSN, RN Entered By: Regan Lemming on 01/09/2016 15:18:10 Daise, Jewelianna [name] ([id number]) -------------------------------------------------------------------------------- Vitals Details Patient Name: Gilani, Glynna [name] Date of Service: 01/09/2016 3:00 PM Medical Record Patient Account Number: [id number] Number: Treating RN: Baruch Gouty, RN, BSN, Velva Harman 1941-04-06 [phone] y.o. Other Clinician: Date of Birth/Sex: Female) Treating ROBSON, MICHAEL Primary Care Physician: Margarita Rana Physician/Extender: G Referring Physician: [name] in Treatment: 4 Vital Signs Time Taken: 15:02 Temperature (F): 97.7 Height (in): 65 Pulse (bpm): 74 Weight (lbs): 134 Respiratory Rate (breaths/min): 16 Body Mass Index (BMI): 22.3 Blood Pressure (mmHg): 130/50 Reference Range: 80 - 120 mg / dl Electronic Signature(s) Signed: 01/10/2016 11:39:04 AM By: Regan Lemming BSN, RN Entered By: Regan Lemming on 01/09/2016 15:02:56

## 2016-01-10 NOTE — Progress Notes (Signed)
ALEETA, STALLING ([id number]) Visit Report for 01/09/2016 Chief Complaint Document Details Patient Name: [name], [name] Date of Service: 01/09/2016 3:00 PM Medical Record Patient Account Number: [id number] Number: Treating RN: Baruch Gouty, RN, BSN, Velva Harman 04-24-1941 [phone] y.o. Other Clinician: Date of Birth/Sex: Female) Treating Hydeia Mcatee Primary Care Physician/Extender: Lonn Georgia Physician: Referring Physician: [name] in Treatment: 4 Information Obtained from: Patient Chief Complaint Patient is here for a chronic wound on her right anterior lateral leg Electronic Signature(s) Signed: 01/10/2016 7:56:10 AM By: Linton Ham MD Entered By: Linton Ham on 01/09/2016 15:23:52 Bradway, Alorah L. ([id number]) -------------------------------------------------------------------------------- Debridement Details Patient Name: [name], [name] Date of Service: 01/09/2016 3:00 PM Medical Record Patient Account Number: [id number] Number: Treating RN: Baruch Gouty, RN, BSN, Velva Harman [birth year] [phone] y.o. Other Clinician: Date of Birth/Sex: Female) Treating Jarry Manon Primary Care Physician/Extender: Lonn Georgia Physician: Referring Physician: [name] in Treatment: 4 Debridement Performed for Wound #1 Right,Lateral Lower Leg Assessment: Performed By: Physician Ricard Dillon, MD Debridement: Debridement Pre-procedure Yes Verification/Time Out Taken: Start Time: 15:05 Pain Control: Lidocaine 4% Topical Solution Level: Skin/Subcutaneous Tissue Total Area Debrided (L x 0.5 (cm) x 0.5 (cm) = 0.25 (cm) W): Tissue and other Non-Viable, Fibrin/Slough, Subcutaneous material debrided: Instrument: Curette Bleeding: Minimum Hemostasis Achieved: Pressure End Time: 15:09 Procedural Pain: 0 Post Procedural Pain: 0 Response to Treatment: Procedure was tolerated well Post Debridement Measurements of Total Wound Length: (cm) 0.5 Width: (cm)  0.5 Depth: (cm) 0.2 Volume: (cm) 0.039 Post Procedure Diagnosis Same as Pre-procedure Electronic Signature(s) Signed: 01/10/2016 7:56:10 AM By: Linton Ham MD Signed: 01/10/2016 11:39:04 AM By: Regan Lemming BSN, RN Entered By: Linton Ham on 01/09/2016 15:23:40 Alessandrini, Reigan L. ([id number]) Dines, Tessy L. ([id number]) -------------------------------------------------------------------------------- HPI Details Patient Name: [name], [name] Date of Service: 01/09/2016 3:00 PM Medical Record Patient Account Number: [id number] Number: Treating RN: Baruch Gouty, RN, BSN, Velva Harman 12-09-1940 [phone] y.o. Other Clinician: Date of Birth/Sex: Female) Treating Jamall Strohmeier Primary Care Physician/Extender: Lonn Georgia Physician: Referring Physician: [name] in Treatment: 4 History of Present Illness HPI Description: 12/06/15; this is a patient who has no prior wound history and is not a diabetic. In February she was getting into her family truck and the wind pushed the door against the outer aspect of her right lower leg. I believe there was initially some swelling but not clearly a hematoma. 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 Has been using topical antibiotics on this. 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 There is already been some improvement. She has no prior history of PAD and no major history of venous insufficiency. 12/13/15; wound appears healthy, using prisma 12/20/15; patient arrives complaining of increasing pain around the wound. She also had a blister under the wound. 12/26/15; the patient arrives with the blister last week fully excised and healed however she has another floppy flaccid blister just below her wound. The wound does not appear to be infected. The cause of this is not really clear, he does walk 3 miles a day but doesn't think that the area with a blister is currently rubs the bandage at all. She said  she felt a stinging and then noticed this on the weekend. She will complete the antibiotics I gave hertomorrow 01/02/16; the patient's original wound in the right lower leg requires debridement of surface slough and surrounding eschar. The base of this then looks healthy. The area that was a  blister underneath this of uncertain etiology also appears to be better. Culture of this blister from last week was negative 01/09/16; the patient's original wound on the lower right lateral calf requires debridement of surface slough and surrounding eschar. Base of this looks healthy once again. She has another recurrent blister underneath this wound. This is his second one of these. I cultured the last one which was negative. The patient is very active and I wonder whether this has something to do with this there is no evidence of surrounding cellulitis Electronic Signature(s) Signed: 01/10/2016 7:56:10 AM By: Linton Ham MD Entered By: Linton Ham on 01/09/2016 15:25:44 Krempasky, Clarann L. ([id number]) -------------------------------------------------------------------------------- Physical Exam Details Patient Name: [name], [name] Date of Service: 01/09/2016 3:00 PM Medical Record Patient Account Number: [id number] Number: Treating RN: Baruch Gouty, RN, BSN, Velva Harman 09-23-1940 [phone] y.o. Other Clinician: Date of Birth/Sex: Female) Treating Aqsa Sensabaugh Primary Care Physician/Extender: Lonn Georgia Physician: Referring Physician: [name] in Treatment: 4 Notes Wound exam; her original wound was debridement to a healthy-looking base. The blister was fully excised. No evidence of surrounding infection Electronic Signature(s) Signed: 01/10/2016 7:56:10 AM By: Linton Ham MD Entered By: Linton Ham on 01/09/2016 15:26:20 Krontz, [name] ([id number]) -------------------------------------------------------------------------------- Physician Orders Details Patient Name:  Mcmorris, Khai L. Date of Service: 01/09/2016 3:00 PM Medical Record Patient Account Number: [id number] Number: Treating RN: Baruch Gouty, RN, BSN, Velva Harman 01-02-1941 [phone] y.o. Other Clinician: Date of Birth/Sex: Female) Treating Jamason Peckham Primary Care Physician/Extender: Lonn Georgia Physician: Referring Physician: [name] in Treatment: 4 Verbal / Phone Orders: Yes Clinician: Afful, RN, BSN, Rita Read Back and Verified: Yes Diagnosis Coding Wound Cleansing Wound #1 Right,Lateral Lower Leg o Cleanse wound with mild soap and water o May Shower, gently pat wound dry prior to applying new dressing. o May shower with protection. Wound #2 Right,Distal Lower Leg o Cleanse wound with mild soap and water o May Shower, gently pat wound dry prior to applying new dressing. o May shower with protection. Anesthetic Wound #1 Right,Lateral Lower Leg o Topical Lidocaine 4% cream applied to wound bed prior to debridement Wound #2 Right,Distal Lower Leg o Topical Lidocaine 4% cream applied to wound bed prior to debridement Skin Barriers/Peri-Wound Care Wound #1 Right,Lateral Lower Leg o Barrier cream Wound #2 Right,Distal Lower Leg o Barrier cream Primary Wound Dressing Wound #1 Right,Lateral Lower Leg o Prisma Ag Wound #2 Right,Distal Lower Leg o Prisma Ag Secondary Dressing Glogowski, Gean L. ([id number]) Wound #1 Right,Lateral Lower Leg o Gauze and Kerlix/Conform Wound #2 Right,Distal Lower Leg o Gauze and Kerlix/Conform Dressing Change Frequency Wound #1 Right,Lateral Lower Leg o Change dressing every day. Wound #2 Right,Distal Lower Leg o Change dressing every day. Follow-up Appointments Wound #1 Right,Lateral Lower Leg o Return Appointment in 1 week. Wound #2 Right,Distal Lower Leg o Return Appointment in 1 week. Additional Orders / Instructions Wound #1 Right,Lateral Lower Leg o Increase protein intake. o Activity  as tolerated Wound #2 Right,Distal Lower Leg o Increase protein intake. o Activity as tolerated Electronic Signature(s) Signed: 01/10/2016 7:56:10 AM By: Linton Ham MD Signed: 01/10/2016 11:39:04 AM By: Regan Lemming BSN, RN Entered By: Regan Lemming on 01/09/2016 15:18:49 Bertholf, Glendy L. ([id number]) -------------------------------------------------------------------------------- Problem List Details Patient Name: Atilano, [name] Date of Service: 01/09/2016 3:00 PM Medical Record Patient Account Number: [id number] Number: Treating RN: Baruch Gouty, RN, BSN, Velva Harman 1941-06-30 [phone] y.o. 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 ([id number]) -------------------------------------------------------------------------------- Progress Note Details Patient Name: Migues, [name] Date of Service: 01/09/2016 3:00 PM Medical Record Patient Account Number: [id number] Number: Treating RN: Baruch Gouty, RN, BSN, Velva Harman 24-Apr-1941 [phone] y.o. Other Clinician: Date of Birth/Sex: Female) Treating Franceska Strahm Primary Care Physician/Extender: Lonn Georgia Physician: Referring Physician: [name] in Treatment: 4 Subjective Chief Complaint Information obtained from Patient Patient is here for a chronic wound on her right anterior lateral leg History of Present Illness (HPI) 12/06/15; this is a patient who has no prior wound  history and is not a diabetic. In February she was getting into her family truck and the wind pushed the door against the outer aspect of her right lower leg. I believe there was initially some swelling but not clearly a hematoma. 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 Has been using topical antibiotics on this. 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 There is already been some improvement. She has no prior history of PAD and no major history of venous insufficiency. 12/13/15; wound appears healthy, using prisma 12/20/15; patient arrives complaining of increasing pain around the wound. She also had a blister under the wound. 12/26/15; the patient arrives with the blister last week fully excised and healed however she has another floppy flaccid blister just below her wound. The wound does not appear to be infected. The cause of this is not really clear, he does walk 3 miles a day but doesn't think that the area with a blister is currently rubs the bandage at all. She said she felt a stinging and then noticed this on the weekend. She will complete the antibiotics I gave hertomorrow 01/02/16; the patient's original wound in the right lower leg requires debridement of surface slough and surrounding eschar. The base of this then looks healthy. The area that was a blister underneath this of uncertain etiology also appears to be better. Culture of this blister from last week was negative 01/09/16; the patient's original wound on the lower right lateral calf requires debridement of surface slough and surrounding eschar. Base of this looks healthy once again. She has another recurrent blister underneath this wound. This is his second one of these. I cultured the last one which was negative. The patient is very active and I wonder whether this has something to do with this there is no evidence of surrounding cellulitis Objective Bacci, Girtrude L.  ([id number]) Constitutional Vitals Time Taken: 3:02 PM, Height: 65 in, Weight: 134 lbs, BMI: 22.3, Temperature: 97.7 F, Pulse: 74 bpm, Respiratory Rate: 16 breaths/min, Blood Pressure: 130/50 mmHg. Integumentary (Hair, Skin) Wound #1 status is Open. Original cause of wound was Trauma. The wound is located on the Right,Lateral Lower Leg. The wound measures 0.5cm length x 0.5cm width x 0.2cm depth; 0.196cm^2 area and 0.039cm^3 volume. The wound is limited to skin breakdown. There is no tunneling or undermining noted. There is a small amount of serosanguineous drainage noted. The wound margin is distinct with the outline attached to the wound base. There is medium (34-66%) pink, pale granulation within the wound bed. There is a small (  1-33%) amount of necrotic tissue within the wound bed including Adherent Slough. The periwound skin appearance exhibited: Localized Edema, Moist. The periwound skin appearance did not exhibit: Callus, Crepitus, Excoriation, Fluctuance, Friable, Induration, Rash, Scarring, Dry/Scaly, Maceration, Atrophie Blanche, Cyanosis, Ecchymosis, Hemosiderin Staining, Mottled, Pallor, Rubor, Erythema. Periwound temperature was noted as No Abnormality. The periwound has tenderness on palpation. Wound #2 status is Open. Original cause of wound was Blister. The wound is located on the Right,Distal Lower Leg. The wound measures 1.5cm length x 1cm width x 0.1cm depth; 1.178cm^2 area and 0.118cm^3 volume. The wound is limited to skin breakdown. There is a small amount of serosanguineous drainage noted. The wound margin is distinct with the outline attached to the wound base. There is large (67-100%) pink, pale granulation within the wound bed. There is a small (1-33%) amount of necrotic tissue within the wound bed including Adherent Slough. The periwound skin appearance exhibited: Localized Edema, Moist. The periwound skin appearance did not exhibit: Callus, Crepitus, Excoriation,  Fluctuance, Friable, Induration, Rash, Scarring, Dry/Scaly, Maceration, Atrophie Blanche, Cyanosis, Ecchymosis, Hemosiderin Staining, Mottled, Pallor, Rubor, Erythema. Periwound temperature was noted as No Abnormality. The periwound has tenderness on palpation. 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 There was a Skin/Subcutaneous Tissue Oriol, Sima L. ([id number]) Debridement [id number]) debridement with total area of 0.25 sq cm performed by Ricard Dillon, MD. with the following instrument(s): Curette to remove Non-Viable tissue/material including Fibrin/Slough and Subcutaneous after achieving pain control using Lidocaine 4% Topical Solution. A time out was conducted prior to the start of the procedure. A Minimum amount of bleeding was controlled with Pressure. The procedure was tolerated well with a pain level of 0 throughout and a pain level of 0 following the procedure. Post Debridement Measurements: 0.5cm length x 0.5cm width x 0.2cm depth; 0.039cm^3 volume. Post procedure Diagnosis Wound #1: Same as Pre-Procedure Plan Wound Cleansing: Wound #1 Right,Lateral Lower Leg: Cleanse wound with mild soap and water May Shower, gently pat wound dry prior to applying new dressing. May shower with protection. Wound #2 Right,Distal Lower Leg: Cleanse wound with mild soap and water May Shower, gently pat wound dry prior to applying new dressing. May shower with protection. Anesthetic: Wound #1 Right,Lateral Lower Leg: Topical Lidocaine 4% cream applied to wound bed prior to debridement Wound #2 Right,Distal Lower Leg: Topical Lidocaine 4% cream applied to wound bed prior to debridement Skin Barriers/Peri-Wound Care: Wound #1 Right,Lateral Lower Leg: Barrier cream Wound #2  Right,Distal Lower Leg: Barrier cream Primary Wound Dressing: Wound #1 Right,Lateral Lower Leg: Prisma Ag Wound #2 Right,Distal Lower Leg: Prisma Ag Secondary Dressing: Wound #1 Right,Lateral Lower Leg: Gauze and Kerlix/Conform Wound #2 Right,Distal Lower Leg: Gauze and Kerlix/Conform Dressing Change Frequency: Wound #1 Right,Lateral Lower Leg: Change dressing every day. Wound #2 Right,Distal Lower Leg: Change dressing every day. Hargreaves, Eleina L. ([id number]) Follow-up Appointments: Wound #1 Right,Lateral Lower Leg: Return Appointment in 1 week. Wound #2 Right,Distal Lower Leg: Return Appointment in 1 week. Additional Orders / Instructions: Wound #1 Right,Lateral Lower Leg: Increase protein intake. Activity as tolerated Wound #2 Right,Distal Lower Leg: Increase protein intake. Activity as tolerated #1 we will continue with Prisma, Kerlix and Klonopin form. #2 the exact etiology of the recurrent blistering around the wound is unclear Electronic Signature(s) Signed: 01/10/2016 7:56:10 AM By: Linton Ham MD Entered By:  Linton Ham on 01/09/2016 15:27:26 Maino, Tameya LMarland Kitchen ([id number]) -------------------------------------------------------------------------------- SuperBill Details Patient Name: Aten, [name] Date of Service: 01/09/2016 Medical Record Patient Account Number: [id number] Number: Treating RN: Baruch Gouty, RN, BSN, Rita 09/16/1940 [phone] y.o. Other Clinician: Date of Birth/Sex: Female) Treating Emillia Weatherly Primary Care Physician/Extender: Lonn Georgia Physician: Suella Grove in Treatment: 4 Referring Physician: Margarita Rana Diagnosis Coding ICD-10 Codes Code Description [phone] Non-pressure chronic ulcer of right calf with necrosis of muscle I87.2 Venous insufficiency (chronic) (peripheral) Facility Procedures CPT4 Code Description: [id number] - DEB SUBQ TISSUE 20 SQ CM/< ICD-10 Description Diagnosis L97.213 Non-pressure chronic ulcer of  right calf with necro Modifier: sis of muscl Quantity: 1 e Physician Procedures CPT4 Code Description: [id number] [id number] - WC PHYS SUBQ TISS 20 SQ CM ICD-10 Description Diagnosis L97.213 Non-pressure chronic ulcer of right calf with necro Modifier: sis of muscle Quantity: 1 Electronic Signature(s) Signed: 01/10/2016 7:56:10 AM By: Linton Ham MD Entered By: Linton Ham on 01/09/2016 15:27:43

## 2016-01-16 ENCOUNTER — Encounter: Payer: PPO | Admitting: Internal Medicine

## 2016-01-16 DIAGNOSIS — L97213 Non-pressure chronic ulcer of right calf with necrosis of muscle: Secondary | ICD-10-CM | POA: Diagnosis not present

## 2016-01-16 DIAGNOSIS — S81801A Unspecified open wound, right lower leg, initial encounter: Secondary | ICD-10-CM | POA: Diagnosis not present

## 2016-01-17 NOTE — Progress Notes (Signed)
JOSELYNE, KOLIS ([id number]) Visit Report for 01/16/2016 Chief Complaint Document Details Patient Name: [name] [name], [name] Date of Service: 01/16/2016 10:45 AM Medical Record Patient Account Number: [id number] Number: Treating RN: Ahmed Prima 1941/07/11 (74 y.o. Other Clinician: Date of Birth/Sex: Female) Treating Marillyn Goren Primary Care Physician/Extender: Lonn Georgia Physician: Referring Physician: [name] in Treatment: 5 Information Obtained from: Patient Chief Complaint Patient is here for a chronic wound on her right anterior lateral leg Electronic Signature(s) Signed: 01/17/2016 7:52:30 AM By: Linton Ham MD Entered By: Linton Ham on 01/16/2016 11:42:37 [place], [place]. ([id number]) -------------------------------------------------------------------------------- Debridement Details Patient Name: [name], [name] Date of Service: 01/16/2016 10:45 AM Medical Record Patient Account Number: [id number] Number: Treating RN: Ahmed Prima 1941/03/08 (74 y.o. Other Clinician: Date of Birth/Sex: Female) Treating Tiajuana Leppanen Primary Care Physician/Extender: Lonn Georgia Physician: Referring Physician: [name] in Treatment: 5 Debridement Performed for Wound #1 Right,Lateral Lower Leg Assessment: Performed By: Physician Ricard Dillon, MD Debridement: Debridement Pre-procedure Yes Verification/Time Out Taken: Start Time: 11:20 Pain Control: Lidocaine 4% Topical Solution Level: Skin/Subcutaneous Tissue Total Area Debrided (L x 1 (cm) x 0.5 (cm) = 0.5 (cm) W): Tissue and other Viable, Non-Viable, Exudate, Fibrin/Slough, Subcutaneous material debrided: Instrument: Curette Bleeding: Minimum Hemostasis Achieved: Pressure End Time: 11:21 Procedural Pain: 0 Post Procedural Pain: 0 Response to Treatment: Procedure was tolerated well Post Debridement Measurements of Total Wound Length: (cm) 1 Width:  (cm) 0.5 Depth: (cm) 0.2 Volume: (cm) 0.079 Post Procedure Diagnosis Same as Pre-procedure Electronic Signature(s) Signed: 01/16/2016 4:50:53 PM By: Alric Quan Signed: 01/17/2016 7:52:30 AM By: Linton Ham MD Entered By: Linton Ham on 01/16/2016 11:41:31 Starner, Chaneka L. ([id number]) Catala, Tyreshia L. ([id number]) -------------------------------------------------------------------------------- Debridement Details Patient Name: [name], [name] Date of Service: 01/16/2016 10:45 AM Medical Record Patient Account Number: [id number] Number: Treating RN: Ahmed Prima 08/18/1941 (74 y.o. Other Clinician: Date of Birth/Sex: Female) Treating Christia Coaxum Primary Care Physician/Extender: Lonn Georgia Physician: Referring Physician: [name] in Treatment: 5 Debridement Performed for Wound #2 Right,Distal Lower Leg Assessment: Performed By: Physician Ricard Dillon, MD Debridement: Debridement Pre-procedure Yes Verification/Time Out Taken: Start Time: 11:22 Pain Control: Lidocaine 4% Topical Solution Level: Skin/Subcutaneous Tissue Total Area Debrided (L x 0.7 (cm) x 0.5 (cm) = 0.35 (cm) W): Tissue and other Viable, Non-Viable, Exudate, Fibrin/Slough, Subcutaneous material debrided: Instrument: Curette Bleeding: Minimum Hemostasis Achieved: Pressure End Time: 11:24 Procedural Pain: 0 Post Procedural Pain: 0 Response to Treatment: Procedure was tolerated well Post Debridement Measurements of Total Wound Length: (cm) 0.7 Width: (cm) 0.5 Depth: (cm) 0.1 Volume: (cm) 0.027 Post Procedure Diagnosis Same as Pre-procedure Electronic Signature(s) Signed: 01/16/2016 4:50:53 PM By: Alric Quan Signed: 01/17/2016 7:52:30 AM By: Linton Ham MD Entered By: Linton Ham on 01/16/2016 11:41:57 Sigal, Vernie L. ([id number]) Stoneham, Aliah L.  ([id number]) -------------------------------------------------------------------------------- HPI Details Patient Name: [name], [name] Date of Service: 01/16/2016 10:45 AM Medical Record Patient Account Number: [id number] Number: Treating RN: Ahmed Prima 1940/10/31 (74 y.o. Other Clinician: Date of Birth/Sex: Female) Treating Satya Bohall Primary Care Physician/Extender: Lonn Georgia Physician: Referring Physician: [name] in Treatment: 5 History of Present Illness HPI Description: 12/06/15; this is a patient who has no prior wound history and is not a diabetic. In February she was getting into her family truck and the wind pushed the door against the outer aspect of her right lower leg. I believe there was initially some swelling but not clearly a hematoma. She  has been left with a chronic nonhealing wound since then. Has been using topical antibiotics on this. 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 There is already been some improvement. She has no prior history of PAD and no major history of venous insufficiency. 12/13/15; wound appears healthy, using prisma 12/20/15; patient arrives complaining of increasing pain around the wound. She also had a blister under the wound. 12/26/15; the patient arrives with the blister last week fully excised and healed however she has another floppy flaccid blister just below her wound. The wound does not appear to be infected. The cause of this is not really clear, he does walk 3 miles a day but doesn't think that the area with a blister is currently rubs the bandage at all. She said she felt a stinging and then noticed this on the weekend. She will complete the antibiotics I gave hertomorrow 01/02/16; the patient's original wound in the right lower leg requires debridement of surface slough and surrounding eschar. The base of this then looks healthy. The area that was a blister underneath this  of uncertain etiology also appears to be better. Culture of this blister from last week was negative 01/09/16; the patient's original wound on the lower right lateral calf requires debridement of surface slough and surrounding eschar. Base of this looks healthy once again. She has another recurrent blister underneath this wound. This is his second one of these. I cultured the last one which was negative. The patient is very active and I wonder whether this has something to do with this there is no evidence of surrounding cellulitis 01/16/16; the patient's original wound on the lower right calf again requires debridement. Her subsequent wound below this which was a blister last week also has a fibrinous surface slough that requires debridement. Again the etiology of these wounds isn't completely clear to me. She does not have PAD, her ABI in this clinic was 1.17 on the right. She may have mild venous reflux but certainly does not have frank venous inflammation. She is very active telling me she walked 5 miles this weekend but states that she did not have anything that showed of caused pressure friction on the wound area. Electronic Signature(s) Signed: 01/17/2016 7:52:30 AM By: Linton Ham MD Entered By: Linton Ham on 01/16/2016 11:45:05 Lares, Mahum L. ([id number]) -------------------------------------------------------------------------------- Physical Exam Details Patient Name: Ramakrishnan, Alexa L. Date of Service: 01/16/2016 10:45 AM Medical Record Patient Account Number: [id number] Number: Treating RN: Ahmed Prima 1940/10/14 (74 y.o. Other Clinician: Date of Birth/Sex: Female) Treating Tameah Mihalko Primary Care Physician/Extender: Lonn Georgia Physician: Referring Physician: [name] in Treatment: 5 Constitutional Sitting or standing Blood Pressure is within target range for patient.. Pulse regular and within target range for patient.Marland Kitchen Respirations  regular, non-labored and within target range.. Temperature is normal and within the target range for the patient.. Cardiovascular Pedal pulses palpable and strong bilaterally.. Extremities are free of varicosities, clubbing or edema. Peripheral pulses strong and equal. Capillary refill < 3 seconds.. Notes Wound exam; the areas are on her medial right ankle. Once again there is a fibrinous surface slough and a surface eschar over the original wound. The the blister underneath this as fibrinous surface slough and there is a small area underneath all of this which appears to be epithelialized Electronic Signature(s) Signed: 01/17/2016 7:52:30 AM By: Linton Ham MD Entered By: Linton Ham on 01/16/2016 11:46:37 Harts, [name] ([id number]) -------------------------------------------------------------------------------- Physician Orders Details Patient Name: Sydnor,  Jaydn L. Date of Service: 01/16/2016 10:45 AM Medical Record Patient Account Number: [id number] Number: Treating RN: Ahmed Prima April 18, 1941 (74 y.o. Other Clinician: Date of Birth/Sex: Female) Treating Albana Saperstein Primary Care Physician/Extender: Lonn Georgia Physician: Referring Physician: [name] in Treatment: 5 Verbal / Phone Orders: Yes Clinician: Carolyne Fiscal, Debi Read Back and Verified: Yes Diagnosis Coding Wound Cleansing Wound #1 Right,Lateral Lower Leg o Cleanse wound with mild soap and water o May Shower, gently pat wound dry prior to applying new dressing. o May shower with protection. Wound #2 Right,Distal Lower Leg o Cleanse wound with mild soap and water o May Shower, gently pat wound dry prior to applying new dressing. o May shower with protection. Anesthetic Wound #1 Right,Lateral Lower Leg o Topical Lidocaine 4% cream applied to wound bed prior to debridement Wound #2 Right,Distal Lower Leg o Topical Lidocaine 4% cream applied to wound bed prior  to debridement Skin Barriers/Peri-Wound Care Wound #1 Right,Lateral Lower Leg o Barrier cream Wound #2 Right,Distal Lower Leg o Barrier cream Primary Wound Dressing Wound #1 Right,Lateral Lower Leg o Iodoflex Wound #2 Right,Distal Lower Leg o Iodoflex Secondary Dressing Courts, Rosalva L. ([id number]) Wound #1 Right,Lateral Lower Leg o Boardered Foam Dressing Wound #2 Right,Distal Lower Leg o Boardered Foam Dressing Dressing Change Frequency Wound #1 Right,Lateral Lower Leg o Change dressing every other day. Wound #2 Right,Distal Lower Leg o Change dressing every week Follow-up Appointments Wound #1 Right,Lateral Lower Leg o Return Appointment in 1 week. Wound #2 Right,Distal Lower Leg o Return Appointment in 1 week. Additional Orders / Instructions Wound #1 Right,Lateral Lower Leg o Increase protein intake. o Activity as tolerated Wound #2 Right,Distal Lower Leg o Increase protein intake. o Activity as tolerated Electronic Signature(s) Signed: 01/16/2016 4:50:53 PM By: Alric Quan Signed: 01/17/2016 7:52:30 AM By: Linton Ham MD Entered By: Alric Quan on 01/16/2016 11:23:58 Bouska, Cricket. ([id number]) -------------------------------------------------------------------------------- Problem List Details Patient Name: Szczesniak, Brailee L. Date of Service: 01/16/2016 10:45 AM Medical Record Patient Account Number: [id number] Number: Treating RN: Ahmed Prima March 04, 1941 (74 y.o. 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 ([id number]) -------------------------------------------------------------------------------- Progress Note Details Patient Name: Detwiler, Maylee L. Date of Service: 01/16/2016 10:45 AM Medical Record Patient Account Number: [id number] Number: Treating RN: Ahmed Prima 14-Oct-1940 (74 y.o. Other Clinician: Date of Birth/Sex: Female) Treating Mayrani Khamis Primary Care Physician/Extender: Lonn Georgia Physician: Referring Physician: [name] in Treatment: 5 Subjective Chief Complaint Information obtained from Patient Patient is here for a chronic wound on her right anterior lateral leg History of Present Illness (HPI) 12/06/15; this is a patient who has no prior wound history and is not a diabetic. In February she was getting into her family truck and the wind pushed the door against the outer aspect of her right lower leg. I believe there was initially some swelling but not clearly a hematoma. 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 Has been using topical antibiotics on this. 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 There is already been some  improvement. She has no prior history of PAD and no major history of venous insufficiency. 12/13/15; wound appears healthy, using prisma 12/20/15; patient arrives complaining of increasing pain around the wound. She also had a blister under the wound. 12/26/15; the patient arrives with the blister last week fully excised and healed however she has another floppy flaccid blister just below her wound. The wound does not appear to be infected. The cause of this is not really clear, he does walk 3 miles a day but doesn't think that the area with a blister is currently rubs the bandage at all. She said she felt a stinging and then noticed  this on the weekend. She will complete the antibiotics I gave hertomorrow 01/02/16; the patient's original wound in the right lower leg requires debridement of surface slough and surrounding eschar. The base of this then looks healthy. The area that was a blister underneath this of uncertain etiology also appears to be better. Culture of this blister from last week was negative 01/09/16; the patient's original wound on the lower right lateral calf requires debridement of surface slough and surrounding eschar. Base of this looks healthy once again. She has another recurrent blister underneath this wound. This is his second one of these. I cultured the last one which was negative. The patient is very active and I wonder whether this has something to do with this there is no evidence of surrounding cellulitis 01/16/16; the patient's original wound on the lower right calf again requires debridement. Her subsequent wound below this which was a blister last week also has a fibrinous surface slough that requires debridement. Again the etiology of these wounds isn't completely clear to me. She does not have PAD, her ABI in this clinic was 1.17 on the right. She may have mild venous reflux but certainly does not have frank venous inflammation. She is very active telling me she walked 5 miles this weekend but states that she did not have anything that showed of caused pressure friction on the wound area. Muecke, Kesia L. ([id number]) Objective Constitutional Sitting or standing Blood Pressure is within target range for patient.. Pulse regular and within target range for patient.Marland Kitchen Respirations regular, non-labored and within target range.. Temperature is normal and within the target range for the patient.. Vitals Time Taken: 10:42 AM, Height: 65 in, Weight: 134 lbs, BMI: 22.3, Pulse: 66 bpm, Respiratory Rate: 18 breaths/min, Blood Pressure: 123/62 mmHg. Cardiovascular Pedal pulses palpable and strong  bilaterally.. Extremities are free of varicosities, clubbing or edema. Peripheral pulses strong and equal. Capillary refill < 3 seconds.. General Notes: Wound exam; the areas are on her medial right ankle. Once again there is a fibrinous surface slough and a surface eschar over the original wound. The the blister underneath this as fibrinous surface slough and there is a small area underneath all of this which appears to be epithelialized Integumentary (Hair, Skin) Wound #1 status is Open. Original cause of wound was Trauma. The wound is located on the Right,Lateral Lower Leg. The wound measures 1cm length x 0.5cm width x 0.1cm depth; 0.393cm^2 area and 0.039cm^3 volume. The wound is limited to skin breakdown. There is no tunneling or undermining noted. There is a medium amount of serosanguineous drainage noted. The wound margin is distinct with the outline attached to the wound base. There is no granulation within the wound bed. There is a large (67- 100%) amount of necrotic tissue within the wound bed including Eschar. The periwound skin appearance exhibited: Localized Edema,  Moist. The periwound skin appearance did not exhibit: Callus, Crepitus, Excoriation, Fluctuance, Friable, Induration, Rash, Scarring, Dry/Scaly, Maceration, Atrophie Blanche, Cyanosis, Ecchymosis, Hemosiderin Staining, Mottled, Pallor, Rubor, Erythema. Periwound temperature was noted as No Abnormality. The periwound has tenderness on palpation. Wound #2 status is Open. Original cause of wound was Blister. The wound is located on the Right,Distal Lower Leg. The wound measures 0.7cm length x 0.5cm width x 0.1cm depth; 0.275cm^2 area and 0.027cm^3 volume. The wound is limited to skin breakdown. There is no tunneling or undermining noted. There is a medium amount of serous drainage noted. The wound margin is distinct with the outline attached to the wound base. There is medium (34-66%) pink, pale granulation within the wound  bed. There is a medium (34-66%) amount of necrotic tissue within the wound bed including Adherent Slough. The periwound skin appearance exhibited: Localized Edema, Moist. The periwound skin appearance did not exhibit: Callus, Crepitus, Excoriation, Fluctuance, Friable, Induration, Rash, Scarring, Dry/Scaly, Maceration, Atrophie Blanche, Cyanosis, Ecchymosis, Hemosiderin Staining, Mottled, Pallor, Rubor, Erythema. Periwound temperature was noted as No Abnormality. The periwound has tenderness on palpation. Sok, Biviana L. ([id number]) Assessment Active Problems ICD-10 [phone] - Non-pressure chronic ulcer of right calf with necrosis of muscle I87.2 - Venous insufficiency (chronic) (peripheral) Procedures Wound #1 Wound #1 is a Skin Tear located on the Right,Lateral Lower Leg . There was a Skin/Subcutaneous Tissue Debridement [id number]) debridement with total area of 0.5 sq cm performed by Ricard Dillon, MD. with the following instrument(s): Curette to remove Viable and Non-Viable tissue/material including Exudate, Fibrin/Slough, and Subcutaneous after achieving pain control using Lidocaine 4% Topical Solution. A time out was conducted prior to the start of the procedure. A Minimum amount of bleeding was controlled with Pressure. The procedure was tolerated well with a pain level of 0 throughout and a pain level of 0 following the procedure. Post Debridement Measurements: 1cm length x 0.5cm width x 0.2cm depth; 0.079cm^3 volume. Post procedure Diagnosis Wound #1: Same as Pre-Procedure Wound #2 Wound #2 is a Trauma, Other located on the Right,Distal Lower Leg . There was a Skin/Subcutaneous Tissue Debridement [id number]) debridement with total area of 0.35 sq cm performed by Ricard Dillon, MD. with the following instrument(s): Curette to remove Viable and Non-Viable tissue/material including Exudate, Fibrin/Slough, and Subcutaneous after achieving pain control using Lidocaine  4% Topical Solution. A time out was conducted prior to the start of the procedure. A Minimum amount of bleeding was controlled with Pressure. The procedure was tolerated well with a pain level of 0 throughout and a pain level of 0 following the procedure. Post Debridement Measurements: 0.7cm length x 0.5cm width x 0.1cm depth; 0.027cm^3 volume. Post procedure Diagnosis Wound #2: Same as Pre-Procedure Plan Wound Cleansing: Wound #1 Right,Lateral Lower Leg: Cleanse wound with mild soap and water May Shower, gently pat wound dry prior to applying new dressing. May shower with protection. Meegan, Terrisa L. ([id number]) Wound #2 Right,Distal Lower Leg: Cleanse wound with mild soap and water May Shower, gently pat wound dry prior to applying new dressing. May shower with protection. Anesthetic: Wound #1 Right,Lateral Lower Leg: Topical Lidocaine 4% cream applied to wound bed prior to debridement Wound #2 Right,Distal Lower Leg: Topical Lidocaine 4% cream applied to wound bed prior to debridement Skin Barriers/Peri-Wound Care: Wound #1 Right,Lateral Lower Leg: Barrier cream Wound #2 Right,Distal Lower Leg: Barrier cream Primary Wound Dressing: Wound #1 Right,Lateral Lower Leg: Iodoflex Wound #2 Right,Distal Lower Leg: Iodoflex Secondary Dressing: Wound #1 Right,Lateral Lower Leg: Boardered Foam  Dressing Wound #2 Right,Distal Lower Leg: Boardered Foam Dressing Dressing Change Frequency: Wound #1 Right,Lateral Lower Leg: Change dressing every other day. Wound #2 Right,Distal Lower Leg: Change dressing every week Follow-up Appointments: Wound #1 Right,Lateral Lower Leg: Return Appointment in 1 week. Wound #2 Right,Distal Lower Leg: Return Appointment in 1 week. Additional Orders / Instructions: Wound #1 Right,Lateral Lower Leg: Increase protein intake. Activity as tolerated Wound #2 Right,Distal Lower Leg: Increase protein intake. Activity as tolerated #1 I switched the  dressing on these areas to Iodoflex covered by a border foam #2 I am still not completely certain of the pathogenesis of these wound areas. They seem to started as blisters. LANAISHA, RAFFA ([id number]) Electronic Signature(s) Signed: 01/17/2016 7:52:30 AM By: Linton Ham MD Entered By: Linton Ham on 01/16/2016 11:47:42 Sanjurjo, Janiylah L. ([id number]) -------------------------------------------------------------------------------- SuperBill Details Patient Name: Sheen, Harryette L. Date of Service: 01/16/2016 Medical Record Patient Account Number: [id number] Number: Treating RN: Ahmed Prima 07/25/1941 (74 y.o. Other Clinician: Date of Birth/Sex: Female) Treating Carlon Davidson Primary Care Physician/Extender: Lonn Georgia Physician: Suella Grove in Treatment: 5 Referring Physician: Margarita Rana Diagnosis Coding ICD-10 Codes Code Description [id number] Non-pressure chronic ulcer of right calf with necrosis of muscle I87.2 Venous insufficiency (chronic) (peripheral) Facility Procedures CPT4 Code Description: [id number] - DEB SUBQ TISSUE 20 SQ CM/< ICD-10 Description Diagnosis L97.213 Non-pressure chronic ulcer of right calf with necro Modifier: sis of muscl Quantity: 1 e Physician Procedures CPT4 Code Description: [id number] 11042 - WC PHYS SUBQ TISS 20 SQ CM ICD-10 Description Diagnosis L97.213 Non-pressure chronic ulcer of right calf with necro Modifier: sis of muscle Quantity: 1 Electronic Signature(s) Signed: 01/17/2016 7:52:30 AM By: Linton Ham MD Entered By: Linton Ham on 01/16/2016 11:48:43

## 2016-01-17 NOTE — Progress Notes (Signed)
[name] [name], [name] Visit Report for 01/16/2016 Arrival Information Details Patient Name: [name], [name] Date of Service: 01/16/2016 10:45 AM Medical Record Patient Account Number: [id number] Number: Treating RN: Ahmed Prima Feb 05, 1941 (74 y.o. Other Clinician: Date of Birth/Sex: Female) Treating ROBSON, [place] Primary Care Physician: Margarita Rana Physician/Extender: G Referring Physician: [name] in Treatment: 5 Visit Information History Since Last Visit All ordered tests and consults were completed: No Patient Arrived: Ambulatory Added or deleted any medications: No Arrival Time: 10:42 Any new allergies or adverse reactions: No Accompanied By: self Had a fall or experienced change in No Transfer Assistance: None activities of daily living that may affect Patient Identification Verified: Yes risk of falls: Secondary Verification Process Yes Signs or symptoms of abuse/neglect since last No Completed: visito Patient Requires Transmission-Based No Hospitalized since last visit: No Precautions: Pain Present Now: Yes Patient Has Alerts: No Electronic Signature(s) Signed: 01/16/2016 4:50:53 PM By: Alric Quan Entered By: Alric Quan on 01/16/2016 10:42:25 Deaver, Woodman. ([id number]) -------------------------------------------------------------------------------- Encounter Discharge Information Details Patient Name: [name], [name] Date of Service: 01/16/2016 10:45 AM Medical Record Patient Account Number: [id number] Number: Treating RN: Ahmed Prima [birth year] (74 y.o. Other Clinician: Date of Birth/Sex: Female) Treating ROBSON, [place] Primary Care Physician: Margarita Rana Physician/Extender: G Referring Physician: [name] in Treatment: 5 Encounter Discharge Information Items Discharge Pain Level: 0 Discharge Condition: Stable Ambulatory Status: Ambulatory Discharge Destination:  Home Transportation: Private Auto Accompanied By: self Schedule Follow-up Appointment: Yes Medication Reconciliation completed and provided to Patient/Care Yes [name]: Provided on Clinical Summary of Care: 01/16/2016 Form Type Recipient Paper Patient NB Electronic Signature(s) Signed: 01/16/2016 11:33:45 AM By: Ruthine Dose Entered By: Ruthine Dose on 01/16/2016 11:33:45 [name], [name] ([id number]) -------------------------------------------------------------------------------- Lower Extremity Assessment Details Patient Name: [name], [name] Date of Service: 01/16/2016 10:45 AM Medical Record Patient Account Number: [id number] Number: Treating RN: Ahmed Prima 1941/07/01 (74 y.o. Other Clinician: Date of Birth/Sex: Female) Treating ROBSON, [place] Primary Care Physician: Margarita Rana Physician/Extender: G Referring Physician: [name] in Treatment: 5 Edema Assessment Assessed: [Left: No] [Right: No] E[Left: dema] [Right: :] Calf Left: Right: Point of Measurement: cm From Medial Instep cm 35 cm Ankle Left: Right: Point of Measurement: cm From Medial Instep cm 21 cm Vascular Assessment Pulses: Posterior Tibial Dorsalis Pedis Palpable: [Right:Yes] Extremity colors, hair growth, and conditions: Extremity Color: [Right:Mottled] Temperature of Extremity: [Right:Warm] Capillary Refill: [Right:< 3 seconds] Electronic Signature(s) Signed: 01/16/2016 4:50:53 PM By: Alric Quan Entered By: Alric Quan on 01/16/2016 10:48:31 [name], [name] ([id number]) -------------------------------------------------------------------------------- Multi Wound Chart Details Patient Name: [name], [name] Date of Service: 01/16/2016 10:45 AM Medical Record Patient Account Number: [id number] Number: Treating RN: Ahmed Prima [birth year] (74 y.o. Other Clinician: Date of Birth/Sex: Female) Treating ROBSON, MICHAEL Primary Care Physician:  Margarita Rana Physician/Extender: G Referring Physician: [name] in Treatment: 5 Vital Signs Height(in): 65 Pulse(bpm): 66 Weight(lbs): 134 Blood Pressure 123/62 (mmHg): Body Mass Index(BMI): 22 Temperature(F): Respiratory Rate 18 (breaths/min): Photos: [1:No Photos] [2:No Photos] [N/A:N/A] Wound Location: [1:Right Lower Leg - Lateral] [2:Right Lower Leg - Distal] [N/A:N/A] Wounding Event: [1:Trauma] [2:Blister] [N/A:N/A] Primary Etiology: [1:Skin Tear] [2:Trauma, Other] [N/A:N/A] Comorbid History: [1:Cataracts, Chronic Obstructive Pulmonary Disease (COPD), Hypertension, Osteoarthritis, Neuropathy, Confinement Anxiety] [2:Cataracts, Chronic Obstructive Pulmonary Disease (COPD), Hypertension, Osteoarthritis, Neuropathy, Confinement  Anxiety] [N/A:N/A] Date Acquired: [1:10/11/2015] [2:12/13/2015] [N/A:N/A] Weeks of Treatment: [1:5] [2:4] [N/A:N/A] Wound Status: [1:Open] [2:Open] [N/A:N/A] Measurements L x W x D 1x0.5x0.1 [2:0.7x0.5x0.1] [N/A:N/A] (cm) Area (cm) : [  1:0.393] [2:0.275] [N/A:N/A] Volume (cm) : [1:0.039] [2:0.027] [N/A:N/A] % Reduction in Area: [1:87.50%] [2:75.00%] [N/A:N/A] % Reduction in Volume: 87.60% [2:75.50%] [N/A:N/A] Classification: [1:Full Thickness Without Exposed Support Structures] [2:Full Thickness Without Exposed Support Structures] [N/A:N/A] Exudate Amount: [1:Medium] [2:Medium] [N/A:N/A] Exudate Type: [1:Serosanguineous] [2:Serous] [N/A:N/A] Exudate Color: [1:red, brown] [2:amber] [N/A:N/A] Wound Margin: [1:Distinct, outline attached] [2:Distinct, outline attached] [N/A:N/A] Granulation Amount: [1:None Present (0%)] [2:Medium (34-66%)] [N/A:N/A] Granulation Quality: N/A Pink, Pale N/A Necrotic Amount: Large (67-100%) Medium (34-66%) N/A Necrotic Tissue: Eschar Adherent Slough N/A Exposed Structures: Fascia: No Fascia: No N/A Fat: No Fat: No Tendon: No Tendon: No Muscle: No Muscle: No Joint: No Joint: No Bone: No Bone:  No Limited to Skin Limited to Skin Breakdown Breakdown Epithelialization: Large (67-100%) Medium (34-66%) N/A Periwound Skin Texture: Edema: Yes Edema: Yes N/A Excoriation: No Excoriation: No Induration: No Induration: No Callus: No Callus: No Crepitus: No Crepitus: No Fluctuance: No Fluctuance: No Friable: No Friable: No Rash: No Rash: No Scarring: No Scarring: No Periwound Skin Moist: Yes Moist: Yes N/A Moisture: Maceration: No Maceration: No Dry/Scaly: No Dry/Scaly: No Periwound Skin Color: Atrophie Blanche: No Atrophie Blanche: No N/A Cyanosis: No Cyanosis: No Ecchymosis: No Ecchymosis: No Erythema: No Erythema: No Hemosiderin Staining: No Hemosiderin Staining: No Mottled: No Mottled: No Pallor: No Pallor: No Rubor: No Rubor: No Temperature: No Abnormality No Abnormality N/A Tenderness on Yes Yes N/A Palpation: Wound Preparation: Ulcer Cleansing: Ulcer Cleansing: Other: N/A Rinsed/Irrigated with water and soap Saline Topical Anesthetic Topical Anesthetic Applied: Other: lidociane Applied: Other: lidocaine 4% 4% Treatment Notes Electronic Signature(s) Signed: 01/16/2016 4:50:53 PM By: Alric Quan Entered By: Alric Quan on 01/16/2016 10:54:35 Otwell, Don L. ([id number]) Baltes, Ellayna LMarland Kitchen ([id number]) -------------------------------------------------------------------------------- Multi-Disciplinary Care Plan Details Patient Name: [name], [name] Date of Service: 01/16/2016 10:45 AM Medical Record Patient Account Number: [id number] Number: Treating RN: Ahmed Prima 02/12/1941 (74 y.o. Other Clinician: Date of Birth/Sex: Female) Treating ROBSON, MICHAEL Primary Care Physician: Margarita Rana Physician/Extender: G Referring Physician: [name] in Treatment: 5 Active Inactive Orientation to the Wound Care Program Nursing Diagnoses: Knowledge deficit related to the wound healing center  program Goals: Patient/caregiver will verbalize understanding of the [place] Program Date Initiated: 12/06/2015 Goal Status: Active Interventions: Provide education on orientation to the wound center Notes: Venous Leg Ulcer Nursing Diagnoses: Knowledge deficit related to disease process and management Potential for venous Insuffiency (use before diagnosis confirmed) Goals: Non-invasive venous studies are completed as ordered Date Initiated: 12/06/2015 Goal Status: Active Patient will maintain optimal edema control Date Initiated: 12/06/2015 Goal Status: Active Patient/caregiver will verbalize understanding of disease process and disease management Date Initiated: 12/06/2015 Goal Status: Active Verify adequate tissue perfusion prior to therapeutic compression application Date Initiated: 12/06/2015 Goal Status: Active [name], [name] ([id number]) Interventions: Assess peripheral edema status every visit. Compression as ordered Provide education on venous insufficiency Treatment Activities: Non-invasive vascular studies : 12/13/2015 Therapeutic compression applied : 12/13/2015 Notes: Wound/Skin Impairment Nursing Diagnoses: Impaired tissue integrity Knowledge deficit related to smoking impact on wound healing Knowledge deficit related to ulceration/compromised skin integrity Goals: Patient/caregiver will verbalize understanding of skin care regimen Date Initiated: 12/06/2015 Goal Status: Active Ulcer/skin breakdown will have a volume reduction of 30% by week 4 Date Initiated: 12/06/2015 Goal Status: Active Ulcer/skin breakdown will have a volume reduction of 50% by week 8 Date Initiated: 12/06/2015 Goal Status: Active Ulcer/skin breakdown will have a volume reduction of 80% by week 12 Date Initiated: 12/06/2015 Goal Status: Active Ulcer/skin breakdown will heal within  14 weeks Date Initiated: 12/06/2015 Goal Status: Active Interventions: Assess patient/caregiver  ability to perform ulcer/skin care regimen upon admission and as needed Assess ulceration(s) every visit Provide education on ulcer and skin care Treatment Activities: Referred to DME Jennett Tarbell for dressing supplies : 12/13/2015 Skin care regimen initiated : 12/13/2015 Topical wound management initiated : 12/13/2015 [name] [name], [name] Notes: Electronic Signature(s) Signed: 01/16/2016 4:50:53 PM By: Alric Quan Entered By: Alric Quan on 01/16/2016 10:54:29 Pepitone, Sreshta L. ([id number]) -------------------------------------------------------------------------------- Pain Assessment Details Patient Name: Martine, Urijah L. Date of Service: 01/16/2016 10:45 AM Medical Record Patient Account Number: [id number] Number: Treating RN: Ahmed Prima [birth year] (74 y.o. Other Clinician: Date of Birth/Sex: Female) Treating ROBSON, MICHAEL Primary Care Physician: Margarita Rana Physician/Extender: G Referring Physician: [name] in Treatment: 5 Active Problems Location of Pain Severity and Description of Pain Patient Has Paino Yes Site Locations Pain Location: Pain in Ulcers Rate the pain. Current Pain Level: 4 Character of Pain Describe the Pain: Burning, Sharp Pain Management and Medication Current Pain Management: Electronic Signature(s) Signed: 01/16/2016 4:50:53 PM By: Alric Quan Entered By: Alric Quan on 01/16/2016 10:42:41 Calandra, Lossie L. ([id number]) -------------------------------------------------------------------------------- Patient/Caregiver Education Details Patient Name: [name], [name] Date of Service: 01/16/2016 10:45 AM Medical Record Patient Account Number: [id number] Number: Treating RN: Ahmed Prima 10/28/1940 (74 y.o. Other Clinician: Date of Birth/Gender: Female) Treating ROBSON, MICHAEL Primary Care Physician: Margarita Rana Physician/Extender: G Referring Physician: [name] in  Treatment: 5 Education Assessment Education Provided To: Patient Education Topics Provided Wound/Skin Impairment: Handouts: Other: change dressing as ordered Methods: Demonstration, Explain/Verbal Responses: State content correctly Electronic Signature(s) Signed: 01/16/2016 4:50:53 PM By: Alric Quan Entered By: Alric Quan on 01/16/2016 10:57:30 [name], [place]. ([id number]) -------------------------------------------------------------------------------- Wound Assessment Details Patient Name: [name], [name] Date of Service: 01/16/2016 10:45 AM Medical Record Patient Account Number: [id number] Number: Treating RN: Ahmed Prima 1941-02-21 (74 y.o. Other Clinician: Date of Birth/Sex: Female) Treating ROBSON, MICHAEL Primary Care Physician: Margarita Rana Physician/Extender: G Referring Physician: [name] in Treatment: 5 Wound Status Wound Number: 1 Primary Skin Tear Etiology: Wound Location: Right Lower Leg - Lateral Wound Open Wounding Event: Trauma Status: Date Acquired: 10/11/2015 Comorbid Cataracts, Chronic Obstructive Weeks Of Treatment: 5 History: Pulmonary Disease (COPD), Clustered Wound: No Hypertension, Osteoarthritis, Neuropathy, Confinement Anxiety Photos Photo Uploaded By: Alric Quan on 01/16/2016 16:41:30 Wound Measurements Length: (cm) 1 Width: (cm) 0.5 Depth: (cm) 0.1 Area: (cm) 0.393 Volume: (cm) 0.039 % Reduction in Area: 87.5% % Reduction in Volume: 87.6% Epithelialization: Large (67-100%) Tunneling: No Undermining: No Wound Description Full Thickness Without Exposed Classification: Support Structures Wound Margin: Distinct, outline attached Exudate Medium Amount: Exudate Type: Serosanguineous Exudate Color: red, brown [name], [name] Foul Odor After Cleansing: No Wound Bed Granulation Amount: None Present (0%) Exposed Structure Necrotic Amount: Large (67-100%) Fascia Exposed:  No Necrotic Quality: Eschar Fat Layer Exposed: No Tendon Exposed: No Muscle Exposed: No Joint Exposed: No Bone Exposed: No Limited to Skin Breakdown Periwound Skin Texture Texture Color No Abnormalities Noted: No No Abnormalities Noted: No Callus: No Atrophie Blanche: No Crepitus: No Cyanosis: No Excoriation: No Ecchymosis: No Fluctuance: No Erythema: No Friable: No Hemosiderin Staining: No Induration: No Mottled: No Localized Edema: Yes Pallor: No Rash: No Rubor: No Scarring: No Temperature / Pain Moisture Temperature: No Abnormality No Abnormalities Noted: No Tenderness on Palpation: Yes Dry / Scaly: No Maceration: No Moist: Yes Wound Preparation Ulcer Cleansing: Rinsed/Irrigated with Saline Topical Anesthetic Applied: Other: lidocaine 4%, Treatment  Notes Wound #1 (Right, Lateral Lower Leg) 1. Cleansed with: Clean wound with Normal Saline 2. Anesthetic Topical Lidocaine 4% cream to wound bed prior to debridement 3. Peri-wound Care: Skin Prep 4. Dressing Applied: Iodoflex 5. Secondary Dressing Applied Bordered Foam Dressing Electronic Signature(s) MANESHA, SIMISON ([id number]) Signed: 01/16/2016 4:50:53 PM By: Alric Quan Entered By: Alric Quan on 01/16/2016 10:52:58 [name], [name] ([id number]) -------------------------------------------------------------------------------- Wound Assessment Details Patient Name: [name], [name] Date of Service: 01/16/2016 10:45 AM Medical Record Patient Account Number: [id number] Number: Treating RN: Ahmed Prima [birth year] (74 y.o. Other Clinician: Date of Birth/Sex: Female) Treating ROBSON, MICHAEL Primary Care Physician: Margarita Rana Physician/Extender: G Referring Physician: [name] in Treatment: 5 Wound Status Wound Number: 2 Primary Trauma, Other Etiology: Wound Location: Right Lower Leg - Distal Wound Open Wounding Event: Blister Status: Date Acquired:  12/13/2015 Comorbid Cataracts, Chronic Obstructive Weeks Of Treatment: 4 History: Pulmonary Disease (COPD), Clustered Wound: No Hypertension, Osteoarthritis, Neuropathy, Confinement Anxiety Photos Photo Uploaded By: Alric Quan on 01/16/2016 16:41:31 Wound Measurements Length: (cm) 0.7 Width: (cm) 0.5 Depth: (cm) 0.1 Area: (cm) 0.275 Volume: (cm) 0.027 % Reduction in Area: 75% % Reduction in Volume: 75.5% Epithelialization: Medium (34-66%) Tunneling: No Undermining: No Wound Description Full Thickness Without Exposed Classification: Support Structures Wound Margin: Distinct, outline attached Exudate Medium Amount: Exudate Type: Serous Exudate Color: amber Bowmer, Infantof L. ([id number]) Foul Odor After Cleansing: No Wound Bed Granulation Amount: Medium (34-66%) Exposed Structure Granulation Quality: Pink, Pale Fascia Exposed: No Necrotic Amount: Medium (34-66%) Fat Layer Exposed: No Necrotic Quality: Adherent Slough Tendon Exposed: No Muscle Exposed: No Joint Exposed: No Bone Exposed: No Limited to Skin Breakdown Periwound Skin Texture Texture Color No Abnormalities Noted: No No Abnormalities Noted: No Callus: No Atrophie Blanche: No Crepitus: No Cyanosis: No Excoriation: No Ecchymosis: No Fluctuance: No Erythema: No Friable: No Hemosiderin Staining: No Induration: No Mottled: No Localized Edema: Yes Pallor: No Rash: No Rubor: No Scarring: No Temperature / Pain Moisture Temperature: No Abnormality No Abnormalities Noted: No Tenderness on Palpation: Yes Dry / Scaly: No Maceration: No Moist: Yes Wound Preparation Ulcer Cleansing: Other: water and soap, Topical Anesthetic Applied: Other: lidociane 4%, Treatment Notes Wound #2 (Right, Distal Lower Leg) 1. Cleansed with: Clean wound with Normal Saline 2. Anesthetic Topical Lidocaine 4% cream to wound bed prior to debridement 3. Peri-wound Care: Skin Prep 4. Dressing  Applied: Iodoflex 5. Secondary Dressing Applied Bordered Foam Dressing Electronic Signature(s) ONEKA, VEGH ([id number]) Signed: 01/16/2016 4:50:53 PM By: Alric Quan Entered By: Alric Quan on 01/16/2016 10:54:21 Verdell, Kathya L. ([id number]) -------------------------------------------------------------------------------- Vitals Details Patient Name: Stidham, Sher L. Date of Service: 01/16/2016 10:45 AM Medical Record Patient Account Number: [id number] Number: Treating RN: Ahmed Prima 10/27/1940 (74 y.o. Other Clinician: Date of Birth/Sex: Female) Treating ROBSON, MICHAEL Primary Care Physician: Margarita Rana Physician/Extender: G Referring Physician: [name] in Treatment: 5 Vital Signs Time Taken: 10:42 Pulse (bpm): 66 Height (in): 65 Respiratory Rate (breaths/min): 18 Weight (lbs): 134 Blood Pressure (mmHg): 123/62 Body Mass Index (BMI): 22.3 Reference Range: 80 - 120 mg / dl Electronic Signature(s) Signed: 01/16/2016 4:50:53 PM By: Alric Quan Entered By: Alric Quan on 01/16/2016 10:45:32

## 2016-01-23 ENCOUNTER — Encounter: Payer: PPO | Admitting: Internal Medicine

## 2016-01-23 DIAGNOSIS — L97213 Non-pressure chronic ulcer of right calf with necrosis of muscle: Secondary | ICD-10-CM | POA: Diagnosis not present

## 2016-01-23 DIAGNOSIS — S81811A Laceration without foreign body, right lower leg, initial encounter: Secondary | ICD-10-CM | POA: Diagnosis not present

## 2016-01-23 DIAGNOSIS — S80821A Blister (nonthermal), right lower leg, initial encounter: Secondary | ICD-10-CM | POA: Diagnosis not present

## 2016-01-23 NOTE — Progress Notes (Addendum)
[name] [name] ([id number]) Visit Report for 01/23/2016 Arrival Information Details Patient Name: [name] [name] [name] L. Date of Service: 01/23/2016 10:45 AM Medical Record Patient Account Number: [id number] Number: Treating RN: Baruch Gouty, RN, BSN, Rita Sep 10, 1940 [phone] y.o. Other Clinician: Date of Birth/Sex: Female) Treating ROBSON, Stuckey Primary Care Physician: Margarita Rana Physician/Extender: G Referring Physician: [name] in Treatment: 6 Visit Information History Since Last Visit Added or deleted any medications: No Patient Arrived: Ambulatory Any new allergies or adverse reactions: No Arrival Time: 10:53 Had a fall or experienced change in No Accompanied By: self activities of daily living that may affect Transfer Assistance: None risk of falls: Patient Identification Verified: Yes Signs or symptoms of abuse/neglect since last No Secondary Verification Process Yes visito Completed: Hospitalized since last visit: No Patient Requires Transmission-Based No Has Dressing in Place as Prescribed: Yes Precautions: Pain Present Now: No Patient Has Alerts: No Electronic Signature(s) Signed: 01/23/2016 3:28:31 PM By: Regan Lemming BSN, RN Entered By: Regan Lemming on 01/23/2016 10:53:23 [name] [name] [name] L. ([id number]) -------------------------------------------------------------------------------- Encounter Discharge Information Details Patient Name: [name] [name] L. Date of Service: 01/23/2016 10:45 AM Medical Record Patient Account Number: [id number] Number: Treating RN: Baruch Gouty, RN, BSN, Rita [birth year] [phone] y.o. Other Clinician: Date of Birth/Sex: Female) Treating ROBSON, MICHAEL Primary Care Physician: Margarita Rana Physician/Extender: G Referring Physician: [name] in Treatment: 6 Encounter Discharge Information Items Schedule Follow-up Appointment: No Medication Reconciliation completed No and provided to Patient/Care Shavonta Gossen: Provided  on Clinical Summary of Care: 01/23/2016 Form Type Recipient Paper Patient NB Electronic Signature(s) Signed: 01/23/2016 11:25:35 AM By: Ruthine Dose Entered By: Ruthine Dose on 01/23/2016 11:25:35 [name] [name], [place]. ([id number]) -------------------------------------------------------------------------------- Lower Extremity Assessment Details Patient Name: [name] [name] L. Date of Service: 01/23/2016 10:45 AM Medical Record Patient Account Number: [id number] Number: Treating RN: Baruch Gouty, RN, BSN, Rita 1940-10-05 [phone] y.o. Other Clinician: Date of Birth/Sex: Female) Treating ROBSON, [place] Primary Care Physician: Margarita Rana Physician/Extender: G Referring Physician: [name] in Treatment: 6 Edema Assessment Assessed: [Left: No] [Right: No] Edema: [Left: Ye] [Right: s] Calf Left: Right: Point of Measurement: cm From Medial Instep cm 35.2 cm Ankle Left: Right: Point of Measurement: cm From Medial Instep cm 21.2 cm Vascular Assessment Claudication: Claudication Assessment [Right:None] Pulses: Posterior Tibial Dorsalis Pedis Palpable: [Right:Yes] Extremity colors, hair growth, and conditions: Extremity Color: [Right:Mottled] Temperature of Extremity: [Right:Warm] Capillary Refill: [Right:< 3 seconds] Electronic Signature(s) Signed: 01/23/2016 3:28:31 PM By: Regan Lemming BSN, RN Entered By: Regan Lemming on 01/23/2016 10:56:16 [name] [name] [name] L. ([id number]) -------------------------------------------------------------------------------- Multi Wound Chart Details Patient Name: [name] [name] [name] L. Date of Service: 01/23/2016 10:45 AM Medical Record Patient Account Number: [id number] Number: Treating RN: Baruch Gouty, RN, BSN, Rita 1941/07/12 [phone] y.o. Other Clinician: Date of Birth/Sex: Female) Treating ROBSON, [place] Primary Care Physician: Margarita Rana Physician/Extender: G Referring Physician: [name] in Treatment: 6 Vital  Signs Height(in): 65 Pulse(bpm): 70 Weight(lbs): 134 Blood Pressure 124/56 (mmHg): Body Mass Index(BMI): 22 Temperature(F): 97.6 Respiratory Rate 17 (breaths/min): Photos: [1:No Photos] [2:No Photos] [N/A:N/A] Wound Location: [1:Right Lower Leg - Lateral] [2:Right Lower Leg - Distal] [N/A:N/A] Wounding Event: [1:Trauma] [2:Blister] [N/A:N/A] Primary Etiology: [1:Skin Tear] [2:Trauma, Other] [N/A:N/A] Comorbid History: [1:Cataracts, Chronic Obstructive Pulmonary Disease (COPD), Hypertension, Osteoarthritis, Neuropathy, Confinement Anxiety] [2:Cataracts, Chronic Obstructive Pulmonary Disease (COPD), Hypertension, Osteoarthritis, Neuropathy, Confinement  Anxiety] [N/A:N/A] Date Acquired: [1:10/11/2015] [2:12/13/2015] [N/A:N/A] Weeks of Treatment: [1:6] [2:5] [N/A:N/A] Wound Status: [1:Open] [2:Open] [N/A:N/A] Measurements L x W x D 1.3x0.8x0.1 [2:1x0.9x0.1] [N/A:N/A] (cm) Area (cm) : [1:0.817] [  2:0.707] [N/A:N/A] Volume (cm) : [1:0.082] [2:0.071] [N/A:N/A] % Reduction in Area: [1:74.00%] [2:35.70%] [N/A:N/A] % Reduction in Volume: 73.90% [2:35.50%] [N/A:N/A] Classification: [1:Full Thickness Without Exposed Support Structures] [2:Full Thickness Without Exposed Support Structures] [N/A:N/A] Exudate Amount: [1:Medium] [2:Medium] [N/A:N/A] Exudate Type: [1:Serosanguineous] [2:Serous] [N/A:N/A] Exudate Color: [1:red, brown] [2:amber] [N/A:N/A] Wound Margin: [1:Distinct, outline attached] [2:Distinct, outline attached] [N/A:N/A] Granulation Amount: [1:Small (1-33%)] [2:Medium (34-66%)] [N/A:N/A] Granulation Quality: Pink, Pale Pink, Pale N/A Necrotic Amount: Large (67-100%) Medium (34-66%) N/A Exposed Structures: Fascia: No Fascia: No N/A Fat: No Fat: No Tendon: No Tendon: No Muscle: No Muscle: No Joint: No Joint: No Bone: No Bone: No Limited to Skin Limited to Skin Breakdown Breakdown Epithelialization: Large (67-100%) Medium (34-66%) N/A Periwound Skin Texture: Edema:  Yes Edema: Yes N/A Excoriation: No Excoriation: No Induration: No Induration: No Callus: No Callus: No Crepitus: No Crepitus: No Fluctuance: No Fluctuance: No Friable: No Friable: No Rash: No Rash: No Scarring: No Scarring: No Periwound Skin Moist: Yes Moist: Yes N/A Moisture: Maceration: No Maceration: No Dry/Scaly: No Dry/Scaly: No Periwound Skin Color: Atrophie Blanche: No Atrophie Blanche: No N/A Cyanosis: No Cyanosis: No Ecchymosis: No Ecchymosis: No Erythema: No Erythema: No Hemosiderin Staining: No Hemosiderin Staining: No Mottled: No Mottled: No Pallor: No Pallor: No Rubor: No Rubor: No Temperature: No Abnormality No Abnormality N/A Tenderness on Yes Yes N/A Palpation: Wound Preparation: Ulcer Cleansing: Ulcer Cleansing: Other: N/A Rinsed/Irrigated with water and soap Saline Topical Anesthetic Topical Anesthetic Applied: Other: lidociane Applied: Other: lidocaine 4% 4% Treatment Notes Electronic Signature(s) Signed: 01/23/2016 3:28:31 PM By: Regan Lemming BSN, RN Entered By: Regan Lemming on 01/23/2016 11:20:32 [name] [name] [name] [name] ([id number]) -------------------------------------------------------------------------------- [place] Details Patient Name: Forand, Shawntee L. Date of Service: 01/23/2016 10:45 AM Medical Record Patient Account Number: [id number] Number: Treating RN: Baruch Gouty, RN, BSN, Rita 04-24-1941 [phone] y.o. Other Clinician: Date of Birth/Sex: Female) Treating ROBSON, MICHAEL Primary Care Physician: Margarita Rana Physician/Extender: G Referring Physician: [name] in Treatment: 6 Active Inactive Orientation to the Wound Care Program Nursing Diagnoses: Knowledge deficit related to the wound healing center program Goals: Patient/caregiver will verbalize understanding of the [facility] Program Date Initiated: 12/06/2015 Goal Status: Active Interventions: Provide education on orientation  to the wound center Notes: Venous Leg Ulcer Nursing Diagnoses: Knowledge deficit related to disease process and management Potential for venous Insuffiency (use before diagnosis confirmed) Goals: Non-invasive venous studies are completed as ordered Date Initiated: 12/06/2015 Goal Status: Active Patient will maintain optimal edema control Date Initiated: 12/06/2015 Goal Status: Active Patient/caregiver will verbalize understanding of disease process and disease management Date Initiated: 12/06/2015 Goal Status: Active Verify adequate tissue perfusion prior to therapeutic compression application Date Initiated: 12/06/2015 Goal Status: Active Kuehne, Anique L. ([id number]) Interventions: Assess peripheral edema status every visit. Compression as ordered Provide education on venous insufficiency Treatment Activities: Non-invasive vascular studies : 12/13/2015 Therapeutic compression applied : 12/13/2015 Notes: Wound/Skin Impairment Nursing Diagnoses: Impaired tissue integrity Knowledge deficit related to smoking impact on wound healing Knowledge deficit related to ulceration/compromised skin integrity Goals: Patient/caregiver will verbalize understanding of skin care regimen Date Initiated: 12/06/2015 Goal Status: Active Ulcer/skin breakdown will have a volume reduction of 30% by week 4 Date Initiated: 12/06/2015 Goal Status: Active Ulcer/skin breakdown will have a volume reduction of 50% by week 8 Date Initiated: 12/06/2015 Goal Status: Active Ulcer/skin breakdown will have a volume reduction of 80% by week 12 Date Initiated: 12/06/2015 Goal Status: Active Ulcer/skin breakdown will heal within 14 weeks Date Initiated: 12/06/2015 Goal Status:  Active Interventions: Assess patient/caregiver ability to perform ulcer/skin care regimen upon admission and as needed Assess ulceration(s) every visit Provide education on ulcer and skin care Treatment Activities: Referred to DME Sosie Gato  for dressing supplies : 12/13/2015 Skin care regimen initiated : 12/13/2015 Topical wound management initiated : 12/13/2015 LELA, FARRAN ([id number]) Notes: Electronic Signature(s) Signed: 01/23/2016 3:28:31 PM By: Regan Lemming BSN, RN Entered By: Regan Lemming on 01/23/2016 11:20:18 [name] [name] [name] L. ([id number]) -------------------------------------------------------------------------------- Pain Assessment Details Patient Name: Fendrick, Chastidy L. Date of Service: 01/23/2016 10:45 AM Medical Record Patient Account Number: [id number] Number: Treating RN: Baruch Gouty, RN, BSN, Rita Jul 24, 1941 [phone] y.o. Other Clinician: Date of Birth/Sex: Female) Treating ROBSON, MICHAEL Primary Care Physician: Margarita Rana Physician/Extender: G Referring Physician: [name] in Treatment: 6 Active Problems Location of Pain Severity and Description of Pain Patient Has Paino No Site Locations With Dressing Change: No Pain Management and Medication Current Pain Management: Electronic Signature(s) Signed: 01/23/2016 3:28:31 PM By: Regan Lemming BSN, RN Entered By: Regan Lemming on 01/23/2016 10:53:35 Krutz, [place] ([id number]) -------------------------------------------------------------------------------- Patient/Caregiver Education Details Patient Name: Diantonio, Florella L. Date of Service: 01/23/2016 10:45 AM Medical Record Patient Account Number: [id number] Number: Treating RN: Baruch Gouty, RN, BSN, Rita 1941-06-08 [phone] y.o. Other Clinician: Date of Birth/Gender: Female) Treating ROBSON, MICHAEL Primary Care Physician: Margarita Rana Physician/Extender: G Referring Physician: [name] in Treatment: 6 Education Assessment Education Provided To: Patient Education Topics Provided Venous: Methods: Explain/Verbal Responses: State content correctly Welcome To [facility]: Methods: Explain/Verbal Responses: State content correctly Wound/Skin Impairment: Methods:  Explain/Verbal Responses: State content correctly Electronic Signature(s) Signed: 01/23/2016 3:28:31 PM By: Regan Lemming BSN, RN Entered By: Regan Lemming on 01/23/2016 11:26:01 [name] [name] [name] L. ([id number]) -------------------------------------------------------------------------------- Wound Assessment Details Patient Name: Russomanno, Marshayla L. Date of Service: 01/23/2016 10:45 AM Medical Record Patient Account Number: [id number] Number: Treating RN: Baruch Gouty, RN, BSN, Rita 14-Aug-1941 [phone] y.o. Other Clinician: Date of Birth/Sex: Female) Treating ROBSON, MICHAEL Primary Care Physician: Margarita Rana Physician/Extender: G Referring Physician: [name] in Treatment: 6 Wound Status Wound Number: 1 Primary Skin Tear Etiology: Wound Location: Right Lower Leg - Lateral Wound Open Wounding Event: Trauma Status: Date Acquired: 10/11/2015 Comorbid Cataracts, Chronic Obstructive Weeks Of Treatment: 6 History: Pulmonary Disease (COPD), Clustered Wound: No Hypertension, Osteoarthritis, Neuropathy, Confinement Anxiety Photos Photo Uploaded By: Regan Lemming on 01/23/2016 15:19:00 Wound Measurements Length: (cm) 1.3 Width: (cm) 0.8 Depth: (cm) 0.1 Area: (cm) 0.817 Volume: (cm) 0.082 % Reduction in Area: 74% % Reduction in Volume: 73.9% Epithelialization: Large (67-100%) Tunneling: No Undermining: No Wound Description Full Thickness Without Exposed Classification: Support Structures Wound Margin: Distinct, outline attached Exudate Medium Amount: Exudate Type: Serosanguineous Exudate Color: red, brown Lagares, Nalini L. ([id number]) Foul Odor After Cleansing: No Wound Bed Granulation Amount: Small (1-33%) Exposed Structure Granulation Quality: Pink, Pale Fascia Exposed: No Necrotic Amount: Large (67-100%) Fat Layer Exposed: No Necrotic Quality: Adherent Slough Tendon Exposed: No Muscle Exposed: No Joint Exposed: No Bone Exposed: No Limited to Skin  Breakdown Periwound Skin Texture Texture Color No Abnormalities Noted: No No Abnormalities Noted: No Callus: No Atrophie Blanche: No Crepitus: No Cyanosis: No Excoriation: No Ecchymosis: No Fluctuance: No Erythema: No Friable: No Hemosiderin Staining: No Induration: No Mottled: No Localized Edema: Yes Pallor: No Rash: No Rubor: No Scarring: No Temperature / Pain Moisture Temperature: No Abnormality No Abnormalities Noted: No Tenderness on Palpation: Yes Dry / Scaly: No Maceration: No Moist: Yes Wound Preparation Ulcer Cleansing: Rinsed/Irrigated with Saline Topical Anesthetic  Applied: Other: lidocaine 4%, Treatment Notes Wound #1 (Right, Lateral Lower Leg) 1. Cleansed with: Clean wound with Normal Saline 2. Anesthetic Topical Lidocaine 4% cream to wound bed prior to debridement 3. Peri-wound Care: Skin Prep 4. Dressing Applied: Iodoflex 5. Secondary Dressing Applied Bordered Foam Dressing Electronic Signature(s) PEARLEY, AMSBERRY ([id number]) Signed: 01/23/2016 3:28:31 PM By: Regan Lemming BSN, RN Entered By: Regan Lemming on 01/23/2016 11:01:25 [name] [name] [name] L. ([id number]) -------------------------------------------------------------------------------- Wound Assessment Details Patient Name: [name] [name] [name] L. Date of Service: 01/23/2016 10:45 AM Medical Record Patient Account Number: [id number] Number: Treating RN: Baruch Gouty, RN, BSN, Rita Sep 01, 1940 [phone] y.o. Other Clinician: Date of Birth/Sex: Female) Treating ROBSON, MICHAEL Primary Care Physician: Margarita Rana Physician/Extender: G Referring Physician: [name] in Treatment: 6 Wound Status Wound Number: 2 Primary Trauma, Other Etiology: Wound Location: Right Lower Leg - Distal Wound Open Wounding Event: Blister Status: Date Acquired: 12/13/2015 Comorbid Cataracts, Chronic Obstructive Weeks Of Treatment: 5 History: Pulmonary Disease (COPD), Clustered Wound: No Hypertension,  Osteoarthritis, Neuropathy, Confinement Anxiety Photos Photo Uploaded By: Regan Lemming on 01/23/2016 15:19:01 Wound Measurements Length: (cm) 1 Width: (cm) 0.9 Depth: (cm) 0.1 Area: (cm) 0.707 Volume: (cm) 0.071 % Reduction in Area: 35.7% % Reduction in Volume: 35.5% Epithelialization: Medium (34-66%) Tunneling: No Undermining: No Wound Description Full Thickness Without Exposed Classification: Support Structures Wound Margin: Distinct, outline attached Exudate Medium Amount: Exudate Type: Serous Exudate Color: amber [name] [name] [name] L. ([id number]) Foul Odor After Cleansing: No Wound Bed Granulation Amount: Medium (34-66%) Exposed Structure Granulation Quality: Pink, Pale Fascia Exposed: No Necrotic Amount: Medium (34-66%) Fat Layer Exposed: No Necrotic Quality: Adherent Slough Tendon Exposed: No Muscle Exposed: No Joint Exposed: No Bone Exposed: No Limited to Skin Breakdown Periwound Skin Texture Texture Color No Abnormalities Noted: No No Abnormalities Noted: No Callus: No Atrophie Blanche: No Crepitus: No Cyanosis: No Excoriation: No Ecchymosis: No Fluctuance: No Erythema: No Friable: No Hemosiderin Staining: No Induration: No Mottled: No Localized Edema: Yes Pallor: No Rash: No Rubor: No Scarring: No Temperature / Pain Moisture Temperature: No Abnormality No Abnormalities Noted: No Tenderness on Palpation: Yes Dry / Scaly: No Maceration: No Moist: Yes Wound Preparation Ulcer Cleansing: Other: water and soap, Topical Anesthetic Applied: Other: lidociane 4%, Treatment Notes Wound #2 (Right, Distal Lower Leg) 1. Cleansed with: Clean wound with Normal Saline 2. Anesthetic Topical Lidocaine 4% cream to wound bed prior to debridement 3. Peri-wound Care: Skin Prep 4. Dressing Applied: Iodoflex 5. Secondary Dressing Applied Bordered Foam Dressing Electronic Signature(s) FRANCESSCA, JANUARY ([id number]) Signed: 01/23/2016 3:28:31 PM By: Regan Lemming BSN, RN Entered By: Regan Lemming on 01/23/2016 11:01:42 Schwartz, Merly L. ([id number]) -------------------------------------------------------------------------------- Vitals Details Patient Name: [name] [name], Nieshia L. Date of Service: 01/23/2016 10:45 AM Medical Record Patient Account Number: [id number] Number: Treating RN: Baruch Gouty, RN, BSN, Rita 11-01-1940 [phone] y.o. Other Clinician: Date of Birth/Sex: Female) Treating ROBSON, MICHAEL Primary Care Physician: Margarita Rana Physician/Extender: G Referring Physician: [name] in Treatment: 6 Vital Signs Time Taken: 10:55 Temperature (F): 97.6 Height (in): 65 Pulse (bpm): 70 Weight (lbs): 134 Respiratory Rate (breaths/min): 17 Body Mass Index (BMI): 22.3 Blood Pressure (mmHg): 124/56 Reference Range: 80 - 120 mg / dl Electronic Signature(s) Signed: 01/23/2016 3:28:31 PM By: Regan Lemming BSN, RN Entered By: Regan Lemming on 01/23/2016 10:55:52

## 2016-01-25 NOTE — Progress Notes (Signed)
[name] Visit Report for 01/23/2016 Chief Complaint Document Details Patient Name: [name], [name] Date of Service: 01/23/2016 10:45 AM Medical Record Patient Account Number: [id number] Number: Treating RN: Baruch Gouty, RN, BSN, Rita 1940/08/27 [phone] y.o. Other Clinician: Date of Birth/Sex: Female) Treating Konstantinos Cordoba Primary Care Physician/Extender: Lonn Georgia Physician: Referring Physician: [name] in Treatment: 6 Information Obtained from: Patient Chief Complaint Patient is here for a chronic wound on her right anterior lateral leg Electronic Signature(s) Signed: 01/24/2016 5:25:36 PM By: Linton Ham MD Entered By: Linton Ham on 01/23/2016 15:37:49 [name] [name], [name] -------------------------------------------------------------------------------- Debridement Details Patient Name: [name], [name] Date of Service: 01/23/2016 10:45 AM Medical Record Patient Account Number: [id number] Number: Treating RN: Baruch Gouty, RN, BSN, Rita [birth year] [phone] y.o. Other Clinician: Date of Birth/Sex: Female) Treating Jleigh Striplin Primary Care Physician/Extender: Lonn Georgia Physician: Referring Physician: [name] in Treatment: 6 Debridement Performed for Wound #1 Right,Lateral Lower Leg Assessment: Performed By: Physician Ricard Dillon, MD Debridement: Open Wound/Selective Debridement Selective Description: Pre-procedure Yes Verification/Time Out Taken: Start Time: 11:18 Pain Control: Lidocaine 4% Topical Solution Level: Non-Viable Tissue Total Area Debrided (L x 1.3 (cm) x 0.8 (cm) = 1.04 (cm) W): Tissue and other Non-Viable, Fibrin/Slough, Subcutaneous material debrided: Instrument: Curette Bleeding: Minimum Hemostasis Achieved: Pressure End Time: 11:19 Procedural Pain: 0 Post Procedural Pain: 0 Response to Treatment: Procedure was tolerated well Post Debridement Measurements of  Total Wound Length: (cm) 1.3 Width: (cm) 0.8 Depth: (cm) 0.1 Volume: (cm) 0.082 Post Procedure Diagnosis Same as Pre-procedure Electronic Signature(s) Signed: 01/23/2016 3:28:31 PM By: Regan Lemming BSN, RN Signed: 01/24/2016 5:25:36 PM By: Linton Ham MD [name] Entered By: Regan Lemming on 01/23/2016 11:21:24 [name] [name], [name] -------------------------------------------------------------------------------- HPI Details Patient Name: [name] [name], [name] Date of Service: 01/23/2016 10:45 AM Medical Record Patient Account Number: [id number] Number: Treating RN: Baruch Gouty, RN, BSN, Rita [birth year] [phone] y.o. Other Clinician: Date of Birth/Sex: Female) Treating Ger Ringenberg Primary Care Physician/Extender: Lonn Georgia Physician: Referring Physician: [name] in Treatment: 6 History of Present Illness HPI Description: 12/06/15; this is a patient who has no prior wound history and is not a diabetic. In February she was getting into her family truck and the wind pushed the door against the outer aspect of her right lower leg. I believe there was initially some swelling but not clearly a hematoma. 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 Has been using topical antibiotics on this. 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 There is already been some improvement. She has no prior history of PAD and no major history of venous insufficiency. 12/13/15; wound appears healthy, using prisma 12/20/15; patient arrives complaining of increasing pain around the wound. She also had a blister under the wound. 12/26/15; the patient arrives with the blister last week fully excised and healed however she has another floppy flaccid blister just below her wound. The wound does not appear to be infected. The cause of this is not really clear, he does walk 3 miles a day but doesn't think that the area with a blister is  currently rubs the bandage at all. She said she felt a stinging and then noticed this on the weekend. She will complete the antibiotics I gave hertomorrow 01/02/16; the patient's original wound in the right lower leg requires debridement of surface slough and surrounding eschar. The base of this then looks healthy. The  area that was a blister underneath this of uncertain etiology also appears to be better. Culture of this blister from last week was negative 01/09/16; the patient's original wound on the lower right lateral calf requires debridement of surface slough and surrounding eschar. Base of this looks healthy once again. She has another recurrent blister underneath this wound. This is his second one of these. I cultured the last one which was negative. The patient is very active and I wonder whether this has something to do with this there is no evidence of surrounding cellulitis 01/16/16; the patient's original wound on the lower right calf again requires debridement. Her subsequent wound below this which was a blister last week also has a fibrinous surface slough that requires debridement. Again the etiology of these wounds isn't completely clear to me. She does not have PAD, her ABI in this clinic was 1.17 on the right. She may have mild venous reflux but certainly does not have frank venous inflammation. She is very active telling me she walked 5 miles this weekend but states that she did not have anything that showed of caused pressure friction on the wound area. 01/23/16 both the patient's wounds appear to be improved. Her subsequent wound which was a blister also appears to be smaller. Electronic Signature(s) Signed: 01/24/2016 5:25:36 PM By: Linton Ham MD Entered By: Linton Ham on 01/23/2016 15:38:55 [name] [name], [name] [name] [name], [name] -------------------------------------------------------------------------------- Physical Exam Details Patient Name:  [name] [name], [name] Date of Service: 01/23/2016 10:45 AM Medical Record Patient Account Number: [id number] Number: Treating RN: Baruch Gouty, RN, BSN, Rita [birth year] [phone] y.o. Other Clinician: Date of Birth/Sex: Female) Treating Rhylan Kagel Primary Care Physician/Extender: Lonn Georgia Physician: Referring Physician: [name] in Treatment: 6 Notes Wound exam; the area in question is on the medial right ankle. No fibrinous slough is seen. The area is generally appear to be healthy. Both wound areas appear to be smaller Electronic Signature(s) Signed: 01/24/2016 5:25:36 PM By: Linton Ham MD Entered By: Linton Ham on 01/23/2016 15:39:39 [name] [name], Frost. ([id number]) -------------------------------------------------------------------------------- Physician Orders Details Patient Name: [name] [name], [name] Date of Service: 01/23/2016 10:45 AM Medical Record Patient Account Number: [id number] Number: Treating RN: Baruch Gouty, RN, BSN, Rita 01-Aug-1941 [phone] y.o. Other Clinician: Date of Birth/Sex: Female) Treating Teyana Pierron Primary Care Physician/Extender: Lonn Georgia Physician: Referring Physician: [name] in Treatment: 6 Verbal / Phone Orders: Yes Clinician: Afful, RN, BSN, Rita Read Back and Verified: Yes Diagnosis Coding Wound Cleansing Wound #1 Right,Lateral Lower Leg o Cleanse wound with mild soap and water o May Shower, gently pat wound dry prior to applying new dressing. o May shower with protection. Wound #2 Right,Distal Lower Leg o Cleanse wound with mild soap and water o May Shower, gently pat wound dry prior to applying new dressing. o May shower with protection. Anesthetic Wound #1 Right,Lateral Lower Leg o Topical Lidocaine 4% cream applied to wound bed prior to debridement Wound #2 Right,Distal Lower Leg o Topical Lidocaine 4% cream applied to wound bed prior to debridement Skin Barriers/Peri-Wound  Care Wound #1 Right,Lateral Lower Leg o Barrier cream Wound #2 Right,Distal Lower Leg o Barrier cream Primary Wound Dressing Wound #1 Right,Lateral Lower Leg o Iodoflex Wound #2 Right,Distal Lower Leg o Iodoflex Secondary Dressing Jeancharles, Bonnell L. ([id number]) Wound #1 Right,Lateral Lower Leg o Boardered Foam Dressing Wound #2 Right,Distal Lower Leg o Boardered Foam Dressing Dressing Change Frequency Wound #1 Right,Lateral Lower Leg o Change dressing every other  day. Wound #2 Right,Distal Lower Leg o Change dressing every week Follow-up Appointments Wound #1 Right,Lateral Lower Leg o Return Appointment in 1 week. Wound #2 Right,Distal Lower Leg o Return Appointment in 1 week. Additional Orders / Instructions Wound #1 Right,Lateral Lower Leg o Increase protein intake. o Activity as tolerated Wound #2 Right,Distal Lower Leg o Increase protein intake. o Activity as tolerated Electronic Signature(s) Signed: 01/23/2016 3:28:31 PM By: Regan Lemming BSN, RN Signed: 01/24/2016 5:25:36 PM By: Linton Ham MD Entered By: Regan Lemming on 01/23/2016 11:24:41 [name] [name], [name] -------------------------------------------------------------------------------- Problem List Details Patient Name: [name] [name], [name] Date of Service: 01/23/2016 10:45 AM Medical Record Patient Account Number: [id number] Number: Treating RN: Baruch Gouty, RN, BSN, Rita 07/31/1941 [phone] y.o. 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 -------------------------------------------------------------------------------- Progress Note Details Patient Name: Diel, Daniqua L. Date of Service: 01/23/2016 10:45 AM Medical Record Patient Account Number: [id number] Number: Treating RN: Baruch Gouty, RN, BSN, Rita 1941/03/30 [phone] y.o. Other Clinician: Date of Birth/Sex: Female) Treating Cortni Tays Primary Care Physician/Extender: Lonn Georgia Physician: Referring Physician: [name] in Treatment: 6 Subjective Chief Complaint Information obtained from Patient Patient is here for a chronic wound on her right anterior lateral leg History of Present Illness (HPI) 12/06/15; this is a patient who has no prior wound history and is not a diabetic. In February she was getting into her family truck and the wind pushed the door against the outer aspect of her right lower leg. I believe there was initially some swelling but not clearly a hematoma. 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 Has been using topical antibiotics on this. 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 There is already been some improvement. She has no prior history of PAD and no major history of venous insufficiency. 12/13/15; wound appears healthy, using prisma 12/20/15; patient arrives complaining of increasing pain around the wound. She also had a blister under the wound. 12/26/15; the patient arrives with the blister last week fully excised and healed however she has another floppy flaccid blister just below her wound. The wound does not appear to be infected. The cause of this is not really clear, he does walk 3 miles a day but doesn't think that the area with a blister is currently rubs the bandage at all. She said she felt a stinging and then noticed this on the weekend. She will complete  the antibiotics I gave hertomorrow 01/02/16; the patient's original wound in the right lower leg requires debridement of surface slough and surrounding eschar. The base of this then looks healthy. The area that was a blister underneath this of uncertain etiology also appears to be better. Culture of this blister from last week was negative 01/09/16; the patient's original wound on the lower right lateral calf requires debridement of surface slough and surrounding eschar. Base of this looks healthy once again. She has another recurrent blister underneath this wound. This is his second one of these. I cultured the last one which was negative. The patient is very  active and I wonder whether this has something to do with this there is no evidence of surrounding cellulitis 01/16/16; the patient's original wound on the lower right calf again requires debridement. Her subsequent wound below this which was a blister last week also has a fibrinous surface slough that requires debridement. Again the etiology of these wounds isn't completely clear to me. She does not have PAD, her ABI in this clinic was 1.17 on the right. She may have mild venous reflux but certainly does not have frank venous inflammation. She is very active telling me she walked 5 miles this weekend but states that she did not have anything that showed of caused pressure friction on the wound area. 01/23/16 both the patient's wounds appear to be improved. Her subsequent wound which was a blister also appears to be smaller. [name] [name], [name] Objective Constitutional Vitals Time Taken: 10:55 AM, Height: 65 in, Weight: 134 lbs, BMI: 22.3, Temperature: 97.6 F, Pulse: 70 bpm, Respiratory Rate: 17 breaths/min, Blood Pressure: 124/56 mmHg. Integumentary (Hair, Skin) Wound #1 status is Open. Original cause of wound was Trauma. The wound is located on the Right,Lateral Lower Leg. The wound measures 1.3cm length x 0.8cm width x 0.1cm  depth; 0.817cm^2 area and 0.082cm^3 volume. The wound is limited to skin breakdown. There is no tunneling or undermining noted. There is a medium amount of serosanguineous drainage noted. The wound margin is distinct with the outline attached to the wound base. There is small (1-33%) pink, pale granulation within the wound bed. There is a large (67-100%) amount of necrotic tissue within the wound bed including Adherent Slough. The periwound skin appearance exhibited: Localized Edema, Moist. The periwound skin appearance did not exhibit: Callus, Crepitus, Excoriation, Fluctuance, Friable, Induration, Rash, Scarring, Dry/Scaly, Maceration, Atrophie Blanche, Cyanosis, Ecchymosis, Hemosiderin Staining, Mottled, Pallor, Rubor, Erythema. Periwound temperature was noted as No Abnormality. The periwound has tenderness on palpation. Wound #2 status is Open. Original cause of wound was Blister. The wound is located on the Right,Distal Lower Leg. The wound measures 1cm length x 0.9cm width x 0.1cm depth; 0.707cm^2 area and 0.071cm^3 volume. The wound is limited to skin breakdown. There is no tunneling or undermining noted. There is a medium amount of serous drainage noted. The wound margin is distinct with the outline attached to the wound base. There is medium (34-66%) pink, pale granulation within the wound bed. There is a medium (34-66%) amount of necrotic tissue within the wound bed including Adherent Slough. The periwound skin appearance exhibited: Localized Edema, Moist. The periwound skin appearance did not exhibit: Callus, Crepitus, Excoriation, Fluctuance, Friable, Induration, Rash, Scarring, Dry/Scaly, Maceration, Atrophie Blanche, Cyanosis, Ecchymosis, Hemosiderin Staining, Mottled, Pallor, Rubor, Erythema. Periwound temperature was noted as No Abnormality. The periwound has tenderness on palpation. 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 Procedures Wound #1 Wound #1 is a Skin Tear located on the Right,Lateral Lower Leg . There was a Non-Viable Tissue Open Wound/Selective [phone]) debridement with total area of 1.04 sq cm performed by Ricard Dillon, MD. with the following instrument(s): Curette to remove Non-Viable tissue/material including Fibrin/Slough and Subcutaneous after achieving pain control using Lidocaine 4% Topical Solution. A time out was conducted prior to the start of the procedure. A Minimum amount of bleeding was controlled with Pressure. The procedure was tolerated well with a pain level of 0 throughout and a pain level of 0  following the procedure. Post Debridement Measurements: 1.3cm length x 0.8cm width x 0.1cm depth; 0.082cm^3 volume. Post procedure Diagnosis Wound #1: Same as Pre-Procedure Plan Wound Cleansing: Wound #1 Right,Lateral Lower Leg: Cleanse wound with mild soap and water May Shower, gently pat wound dry prior to applying new dressing. May shower with protection. Wound #2 Right,Distal Lower Leg: Cleanse wound with mild soap and water May Shower, gently pat wound dry prior to applying new dressing. May shower with protection. Anesthetic: Wound #1 Right,Lateral Lower Leg: Topical Lidocaine 4% cream applied to wound bed prior to debridement Wound #2 Right,Distal Lower Leg: Topical Lidocaine 4% cream applied to wound bed prior to debridement Skin Barriers/Peri-Wound Care: Wound #1 Right,Lateral Lower Leg: Barrier cream Wound #2 Right,Distal Lower Leg: Barrier cream Primary Wound Dressing: Wound #1 Right,Lateral Lower Leg: Iodoflex Wound #2 Right,Distal Lower Leg: Iodoflex Secondary Dressing: Wound #1 Right,Lateral Lower Leg: Boardered Foam Dressing Bones, Erion L. ([id number]) Wound #2 Right,Distal Lower Leg: Boardered Foam Dressing Dressing Change Frequency: Wound #1  Right,Lateral Lower Leg: Change dressing every other day. Wound #2 Right,Distal Lower Leg: Change dressing every week Follow-up Appointments: Wound #1 Right,Lateral Lower Leg: Return Appointment in 1 week. Wound #2 Right,Distal Lower Leg: Return Appointment in 1 week. Additional Orders / Instructions: Wound #1 Right,Lateral Lower Leg: Increase protein intake. Activity as tolerated Wound #2 Right,Distal Lower Leg: Increase protein intake. Activity as tolerated #1 we will continue with the Iodoflex which appears to have helped the base of these wounds border foam dressing. We will change every second day Electronic Signature(s) Signed: 01/24/2016 5:25:36 PM By: Linton Ham MD Entered By: Linton Ham on 01/23/2016 15:40:16 [name] [name], [name] ([id number]) -------------------------------------------------------------------------------- SuperBill Details Patient Name: Pasquarelli, Jovonda L. Date of Service: 01/23/2016 Medical Record Patient Account Number: [id number] Number: Treating RN: Baruch Gouty, RN, BSN, Rita 1940-09-13 [phone] y.o. Other Clinician: Date of Birth/Sex: Female) Treating Chelise Hanger Primary Care Physician/Extender: Lonn Georgia Physician: Suella Grove in Treatment: 6 Referring Physician: Margarita Rana Diagnosis Coding ICD-10 Codes Code Description [phone] Non-pressure chronic ulcer of right calf with necrosis of muscle I87.2 Venous insufficiency (chronic) (peripheral) Facility Procedures CPT4 Code Description: [id number] - DEBRIDE WOUND 1ST 20 SQ CM OR < ICD-10 Description Diagnosis L97.213 Non-pressure chronic ulcer of right calf with necro Modifier: sis of muscl Quantity: 1 e Physician Procedures CPT4 Code Description: [id number] - WC PHYS DEBR WO ANESTH 20 SQ CM ICD-10 Description Diagnosis L97.213 Non-pressure chronic ulcer of right calf with necro Modifier: sis of muscle Quantity: 1 Electronic Signature(s) Signed: 01/24/2016 5:25:36 PM By:  Linton Ham MD Entered By: Linton Ham on 01/23/2016 15:40:50

## 2016-01-30 ENCOUNTER — Encounter: Payer: PPO | Attending: Internal Medicine | Admitting: Internal Medicine

## 2016-01-30 DIAGNOSIS — S80821D Blister (nonthermal), right lower leg, subsequent encounter: Secondary | ICD-10-CM | POA: Diagnosis not present

## 2016-01-30 DIAGNOSIS — L97213 Non-pressure chronic ulcer of right calf with necrosis of muscle: Secondary | ICD-10-CM | POA: Insufficient documentation

## 2016-01-30 DIAGNOSIS — I872 Venous insufficiency (chronic) (peripheral): Secondary | ICD-10-CM | POA: Insufficient documentation

## 2016-01-30 DIAGNOSIS — Z886 Allergy status to analgesic agent status: Secondary | ICD-10-CM | POA: Insufficient documentation

## 2016-01-30 DIAGNOSIS — S81811A Laceration without foreign body, right lower leg, initial encounter: Secondary | ICD-10-CM | POA: Diagnosis not present

## 2016-01-30 DIAGNOSIS — F419 Anxiety disorder, unspecified: Secondary | ICD-10-CM | POA: Diagnosis not present

## 2016-01-30 DIAGNOSIS — Z885 Allergy status to narcotic agent status: Secondary | ICD-10-CM | POA: Diagnosis not present

## 2016-01-30 DIAGNOSIS — J449 Chronic obstructive pulmonary disease, unspecified: Secondary | ICD-10-CM | POA: Insufficient documentation

## 2016-01-30 DIAGNOSIS — Z87891 Personal history of nicotine dependence: Secondary | ICD-10-CM | POA: Insufficient documentation

## 2016-01-30 DIAGNOSIS — I10 Essential (primary) hypertension: Secondary | ICD-10-CM | POA: Diagnosis not present

## 2016-01-30 DIAGNOSIS — M1990 Unspecified osteoarthritis, unspecified site: Secondary | ICD-10-CM | POA: Diagnosis not present

## 2016-01-30 DIAGNOSIS — Z9109 Other allergy status, other than to drugs and biological substances: Secondary | ICD-10-CM | POA: Insufficient documentation

## 2016-01-30 NOTE — Progress Notes (Addendum)
DAEJHA, HEMMING ([id number]) Visit Report for 01/30/2016 Arrival Information Details Patient Name: [name] [name], [name] Date of Service: 01/30/2016 10:45 AM Medical Record Patient Account Number: [id number] Number: Treating RN: Baruch Gouty, RN, BSN, Rita 02-01-1941 [phone] y.o. Other Clinician: Date of Birth/Sex: Female) Treating ROBSON, [place] Primary Care Physician: Margarita Rana Physician/Extender: G Referring Physician: [name] in Treatment: 7 Visit Information History Since Last Visit Added or deleted any medications: No Patient Arrived: Ambulatory Any new allergies or adverse reactions: No Arrival Time: 10:38 Had a fall or experienced change in No Accompanied By: self activities of daily living that may affect Transfer Assistance: None risk of falls: Patient Identification Verified: Yes Signs or symptoms of abuse/neglect since last No Secondary Verification Process Yes visito Completed: Hospitalized since last visit: No Patient Requires Transmission-Based No Has Dressing in Place as Prescribed: Yes Precautions: Pain Present Now: No Patient Has Alerts: No Electronic Signature(s) Signed: 01/30/2016 10:38:44 AM By: Regan Lemming BSN, RN Entered By: Regan Lemming on 01/30/2016 10:38:44 Enlow, Aniqua L. ([id number]) -------------------------------------------------------------------------------- Encounter Discharge Information Details Patient Name: [name], [name] Date of Service: 01/30/2016 10:45 AM Medical Record Patient Account Number: [id number] Number: Treating RN: Baruch Gouty, RN, BSN, Rita May 26, 1941 [phone] y.o. Other Clinician: Date of Birth/Sex: Female) Treating ROBSON, MICHAEL Primary Care Physician: Margarita Rana Physician/Extender: G Referring Physician: [name] in Treatment: 7 Encounter Discharge Information Items Discharge Pain Level: 0 Discharge Condition: Stable Ambulatory Status: Ambulatory Discharge Destination:  Home Private Transportation: Auto Accompanied By: self Schedule Follow-up Appointment: No Medication Reconciliation completed and No provided to Patient/Care Farron Watrous: Clinical Summary of Care: Electronic Signature(s) Signed: 01/30/2016 2:19:20 PM By: Regan Lemming BSN, RN Entered By: Regan Lemming on 01/30/2016 11:07:14 Meriweather, Jalise L. ([id number]) -------------------------------------------------------------------------------- Lower Extremity Assessment Details Patient Name: [name], [name] Date of Service: 01/30/2016 10:45 AM Medical Record Patient Account Number: [id number] Number: Treating RN: Baruch Gouty, RN, BSN, Rita 01-28-1941 [phone] y.o. Other Clinician: Date of Birth/Sex: Female) Treating ROBSON, [place] Primary Care Physician: Margarita Rana Physician/Extender: G Referring Physician: [name] in Treatment: 7 Edema Assessment Assessed: [Left: No] [Right: No] E[Left: dema] [Right: :] Calf Left: Right: Point of Measurement: cm From Medial Instep cm 35 cm Ankle Left: Right: Point of Measurement: cm From Medial Instep cm 21 cm Vascular Assessment Claudication: Claudication Assessment [Right:None] Pulses: Posterior Tibial Dorsalis Pedis Palpable: [Right:Yes] Extremity colors, hair growth, and conditions: Extremity Color: [Right:Mottled] Hair Growth on Extremity: [Right:No] Temperature of Extremity: [Right:Warm] Capillary Refill: [Right:< 3 seconds] Electronic Signature(s) Signed: 01/30/2016 10:39:36 AM By: Regan Lemming BSN, RN Entered By: Regan Lemming on 01/30/2016 10:39:36 [place], Takelia L. ([id number]) -------------------------------------------------------------------------------- Multi Wound Chart Details Patient Name: [name], [name] Date of Service: 01/30/2016 10:45 AM Medical Record Patient Account Number: [id number] Number: Treating RN: Baruch Gouty, RN, BSN, Rita 1940/12/28 [phone] y.o. Other Clinician: Date of Birth/Sex: Female) Treating ROBSON,  [place] Primary Care Physician: Margarita Rana Physician/Extender: G Referring Physician: [name] in Treatment: 7 Vital Signs Height(in): 65 Pulse(bpm): 69 Weight(lbs): 134 Blood Pressure 132/45 (mmHg): Body Mass Index(BMI): 22 Temperature(F): 97.8 Respiratory Rate 16 (breaths/min): Photos: [1:No Photos] [2:No Photos] [N/A:N/A] Wound Location: [1:Right Lower Leg - Lateral] [2:Right Lower Leg - Distal] [N/A:N/A] Wounding Event: [1:Trauma] [2:Blister] [N/A:N/A] Primary Etiology: [1:Skin Tear] [2:Trauma, Other] [N/A:N/A] Comorbid History: [1:Cataracts, Chronic Obstructive Pulmonary Disease (COPD), Hypertension, Osteoarthritis, Neuropathy, Confinement Anxiety] [2:Cataracts, Chronic Obstructive Pulmonary Disease (COPD), Hypertension, Osteoarthritis, Neuropathy, Confinement  Anxiety] [N/A:N/A] Date Acquired: [1:10/11/2015] [2:12/13/2015] [N/A:N/A] Weeks of Treatment: [1:7] [2:6] [N/A:N/A] Wound Status: [1:Open] [  2:Healed - Epithelialized] [N/A:N/A] Measurements L x W x D 1.3x0.9x0.1 [2:0x0x0] [N/A:N/A] (cm) Area (cm) : [1:0.919] [2:0] [N/A:N/A] Volume (cm) : [1:0.092] [2:0] [N/A:N/A] % Reduction in Area: [1:70.80%] [2:100.00%] [N/A:N/A] % Reduction in Volume: 70.70% [2:100.00%] [N/A:N/A] Classification: [1:Full Thickness Without Exposed Support Structures] [2:Full Thickness Without Exposed Support Structures] [N/A:N/A] Exudate Amount: [1:Medium] [2:None Present] [N/A:N/A] Exudate Type: [1:Serosanguineous] [2:N/A] [N/A:N/A] Exudate Color: [1:red, brown] [2:N/A] [N/A:N/A] Wound Margin: [1:Distinct, outline attached] [2:Distinct, outline attached] [N/A:N/A] Granulation Amount: [1:Small (1-33%)] [2:None Present (0%)] [N/A:N/A] Granulation Quality: Pink, Pale N/A N/A Necrotic Amount: Large (67-100%) None Present (0%) N/A Exposed Structures: Fascia: No Fascia: No N/A Fat: No Fat: No Tendon: No Tendon: No Muscle: No Muscle: No Joint: No Joint: No Bone: No Bone:  No Limited to Skin Limited to Skin Breakdown Breakdown Epithelialization: Large (67-100%) Large (67-100%) N/A Periwound Skin Texture: Edema: Yes Edema: Yes N/A Excoriation: No Excoriation: No Induration: No Induration: No Callus: No Callus: No Crepitus: No Crepitus: No Fluctuance: No Fluctuance: No Friable: No Friable: No Rash: No Rash: No Scarring: No Scarring: No Periwound Skin Moist: Yes Dry/Scaly: Yes N/A Moisture: Maceration: No Maceration: No Dry/Scaly: No Moist: No Periwound Skin Color: Atrophie Blanche: No Atrophie Blanche: No N/A Cyanosis: No Cyanosis: No Ecchymosis: No Ecchymosis: No Erythema: No Erythema: No Hemosiderin Staining: No Hemosiderin Staining: No Mottled: No Mottled: No Pallor: No Pallor: No Rubor: No Rubor: No Temperature: No Abnormality No Abnormality N/A Tenderness on Yes No N/A Palpation: Wound Preparation: Ulcer Cleansing: Ulcer Cleansing: N/A Rinsed/Irrigated with Rinsed/Irrigated with Saline Saline Topical Anesthetic Topical Anesthetic Applied: Other: lidocaine Applied: None 4% Treatment Notes Electronic Signature(s) Signed: 01/30/2016 2:19:20 PM By: Regan Lemming BSN, RN Entered By: Regan Lemming on 01/30/2016 11:01:05 Siordia, Layan L. ([id number]) -------------------------------------------------------------------------------- Multi-Disciplinary Care Plan Details Patient Name: Reindl, Tylesha L. Date of Service: 01/30/2016 10:45 AM Medical Record Patient Account Number: [id number] Number: Treating RN: Baruch Gouty, RN, BSN, Rita 08-Feb-1941 [phone] y.o. Other Clinician: Date of Birth/Sex: Female) Treating ROBSON, MICHAEL Primary Care Physician: Margarita Rana Physician/Extender: G Referring Physician: [name] in Treatment: 7 Active Inactive Orientation to the Wound Care Program Nursing Diagnoses: Knowledge deficit related to the wound healing center program Goals: Patient/caregiver will verbalize understanding  of the [facility] Date Initiated: 12/06/2015 Goal Status: Active Interventions: Provide education on orientation to the wound center Notes: Venous Leg Ulcer Nursing Diagnoses: Knowledge deficit related to disease process and management Potential for venous Insuffiency (use before diagnosis confirmed) Goals: Non-invasive venous studies are completed as ordered Date Initiated: 12/06/2015 Goal Status: Active Patient will maintain optimal edema control Date Initiated: 12/06/2015 Goal Status: Active Patient/caregiver will verbalize understanding of disease process and disease management Date Initiated: 12/06/2015 Goal Status: Active Verify adequate tissue perfusion prior to therapeutic compression application Date Initiated: 12/06/2015 Goal Status: Active Dengel, Myleah L. ([id number]) Interventions: Assess peripheral edema status every visit. Compression as ordered Provide education on venous insufficiency Treatment Activities: Non-invasive vascular studies : 12/13/2015 Therapeutic compression applied : 12/13/2015 Notes: Wound/Skin Impairment Nursing Diagnoses: Impaired tissue integrity Knowledge deficit related to smoking impact on wound healing Knowledge deficit related to ulceration/compromised skin integrity Goals: Patient/caregiver will verbalize understanding of skin care regimen Date Initiated: 12/06/2015 Goal Status: Active Ulcer/skin breakdown will have a volume reduction of 30% by week 4 Date Initiated: 12/06/2015 Goal Status: Active Ulcer/skin breakdown will have a volume reduction of 50% by week 8 Date Initiated: 12/06/2015 Goal Status: Active Ulcer/skin breakdown will have a volume reduction of 80% by week 12 Date  Initiated: 12/06/2015 Goal Status: Active Ulcer/skin breakdown will heal within 14 weeks Date Initiated: 12/06/2015 Goal Status: Active Interventions: Assess patient/caregiver ability to perform ulcer/skin care regimen upon admission and as  needed Assess ulceration(s) every visit Provide education on ulcer and skin care Treatment Activities: Referred to DME Aleli Navedo for dressing supplies : 12/13/2015 Skin care regimen initiated : 12/13/2015 Topical wound management initiated : 12/13/2015 XENA, OMAN ([id number]) Notes: Electronic Signature(s) Signed: 01/30/2016 2:19:20 PM By: Regan Lemming BSN, RN Entered By: Regan Lemming on 01/30/2016 11:00:53 Matus, Ashani L. ([id number]) -------------------------------------------------------------------------------- Pain Assessment Details Patient Name: Heninger, Verenise L. Date of Service: 01/30/2016 10:45 AM Medical Record Patient Account Number: [id number] Number: Treating RN: Baruch Gouty, RN, BSN, Rita 11-17-1940 [phone] y.o. Other Clinician: Date of Birth/Sex: Female) Treating ROBSON, MICHAEL Primary Care Physician: Margarita Rana Physician/Extender: G Referring Physician: [name] in Treatment: 7 Active Problems Location of Pain Severity and Description of Pain Patient Has Paino No Site Locations With Dressing Change: No Pain Management and Medication Current Pain Management: Electronic Signature(s) Signed: 01/30/2016 10:38:51 AM By: Regan Lemming BSN, RN Entered By: Regan Lemming on 01/30/2016 10:38:51 Kuehl, Giulianna Carlean Jews ([id number]) -------------------------------------------------------------------------------- Patient/Caregiver Education Details Patient Name: Nordquist, Reana L. Date of Service: 01/30/2016 10:45 AM Medical Record Patient Account Number: [id number] Number: Treating RN: Baruch Gouty, RN, BSN, Rita 07/18/1941 [phone] y.o. Other Clinician: Date of Birth/Gender: Female) Treating ROBSON, MICHAEL Primary Care Physician: Margarita Rana Physician/Extender: G Referring Physician: [name] in Treatment: 7 Education Assessment Education Provided To: Patient Education Topics Provided Venous: Methods: Explain/Verbal Responses: State content  correctly Welcome To [facility]: Methods: Explain/Verbal Responses: State content correctly Wound/Skin Impairment: Methods: Explain/Verbal Responses: State content correctly Electronic Signature(s) Signed: 01/30/2016 2:19:20 PM By: Regan Lemming BSN, RN Entered By: Regan Lemming on 01/30/2016 11:07:30 Barkan, Juna L. ([id number]) -------------------------------------------------------------------------------- Wound Assessment Details Patient Name: Carstens, Yelina L. Date of Service: 01/30/2016 10:45 AM Medical Record Patient Account Number: [id number] Number: Treating RN: Baruch Gouty, RN, BSN, Rita Dec 22, 1940 [phone] y.o. Other Clinician: Date of Birth/Sex: Female) Treating ROBSON, MICHAEL Primary Care Physician: Margarita Rana Physician/Extender: G Referring Physician: [name] in Treatment: 7 Wound Status Wound Number: 1 Primary Skin Tear Etiology: Wound Location: Right Lower Leg - Lateral Wound Open Wounding Event: Trauma Status: Date Acquired: 10/11/2015 Comorbid Cataracts, Chronic Obstructive Weeks Of Treatment: 7 History: Pulmonary Disease (COPD), Clustered Wound: No Hypertension, Osteoarthritis, Neuropathy, Confinement Anxiety Photos Photo Uploaded By: Regan Lemming on 01/30/2016 14:17:19 Wound Measurements Length: (cm) 1.3 Width: (cm) 0.9 Depth: (cm) 0.1 Area: (cm) 0.919 Volume: (cm) 0.092 % Reduction in Area: 70.8% % Reduction in Volume: 70.7% Epithelialization: Large (67-100%) Tunneling: No Undermining: No Wound Description Full Thickness Without Exposed Classification: Support Structures Wound Margin: Distinct, outline attached Exudate Medium Amount: Exudate Type: Serosanguineous Exudate Color: red, brown Payment, Aalaya L. ([id number]) Foul Odor After Cleansing: No Wound Bed Granulation Amount: Small (1-33%) Exposed Structure Granulation Quality: Pink, Pale Fascia Exposed: No Necrotic Amount: Large (67-100%) Fat Layer Exposed:  No Necrotic Quality: Adherent Slough Tendon Exposed: No Muscle Exposed: No Joint Exposed: No Bone Exposed: No Limited to Skin Breakdown Periwound Skin Texture Texture Color No Abnormalities Noted: No No Abnormalities Noted: No Callus: No Atrophie Blanche: No Crepitus: No Cyanosis: No Excoriation: No Ecchymosis: No Fluctuance: No Erythema: No Friable: No Hemosiderin Staining: No Induration: No Mottled: No Localized Edema: Yes Pallor: No Rash: No Rubor: No Scarring: No Temperature / Pain Moisture Temperature: No Abnormality No Abnormalities Noted: No Tenderness on Palpation: Yes  Dry / Scaly: No Maceration: No Moist: Yes Wound Preparation Ulcer Cleansing: Rinsed/Irrigated with Saline Topical Anesthetic Applied: Other: lidocaine 4%, Treatment Notes Wound #1 (Right, Lateral Lower Leg) 1. Cleansed with: Clean wound with Normal Saline 4. Dressing Applied: Iodoflex 5. Secondary Dressing Applied Bordered Foam Dressing Electronic Signature(s) Signed: 01/30/2016 2:19:20 PM By: Regan Lemming BSN, RN Entered By: Regan Lemming on 01/30/2016 10:47:36 Mousseau, Tanyiah L. ([id number]) -------------------------------------------------------------------------------- Wound Assessment Details Patient Name: Whitham, Lauree L. Date of Service: 01/30/2016 10:45 AM Medical Record Patient Account Number: [id number] Number: Treating RN: Baruch Gouty, RN, BSN, Rita 04-Mar-1941 [phone] y.o. Other Clinician: Date of Birth/Sex: Female) Treating ROBSON, MICHAEL Primary Care Physician: Margarita Rana Physician/Extender: G Referring Physician: [name] in Treatment: 7 Wound Status Wound Number: 2 Primary Trauma, Other Etiology: Wound Location: Right Lower Leg - Distal Wound Healed - Epithelialized Wounding Event: Blister Status: Date Acquired: 12/13/2015 Comorbid Cataracts, Chronic Obstructive Weeks Of Treatment: 6 History: Pulmonary Disease (COPD), Clustered Wound: No Hypertension,  Osteoarthritis, Neuropathy, Confinement Anxiety Photos Photo Uploaded By: Regan Lemming on 01/30/2016 14:17:20 Wound Measurements Length: (cm) 0 % Reduction in Width: (cm) 0 % Reduction in Depth: (cm) 0 Epithelializat Area: (cm) 0 Tunneling: Volume: (cm) 0 Undermining: Area: 100% Volume: 100% ion: Large (67-100%) No No Wound Description Classification: Cuadros, Kenora L. ([id number]) Foul Odor After Cleansing: No Full Thickness Without Exposed Support Structures Wound Margin: Distinct, outline attached Exudate None Present Amount: Wound Bed Granulation Amount: None Present (0%) Exposed Structure Necrotic Amount: None Present (0%) Fascia Exposed: No Fat Layer Exposed: No Tendon Exposed: No Muscle Exposed: No Joint Exposed: No Bone Exposed: No Limited to Skin Breakdown Periwound Skin Texture Texture Color No Abnormalities Noted: No No Abnormalities Noted: No Callus: No Atrophie Blanche: No Crepitus: No Cyanosis: No Excoriation: No Ecchymosis: No Fluctuance: No Erythema: No Friable: No Hemosiderin Staining: No Induration: No Mottled: No Localized Edema: Yes Pallor: No Rash: No Rubor: No Scarring: No Temperature / Pain Moisture Temperature: No Abnormality No Abnormalities Noted: No Dry / Scaly: Yes Maceration: No Moist: No Wound Preparation Ulcer Cleansing: Rinsed/Irrigated with Saline Topical Anesthetic Applied: None Electronic Signature(s) Signed: 01/30/2016 2:19:20 PM By: Regan Lemming BSN, RN Entered By: Regan Lemming on 01/30/2016 10:48:06 Guerin, Kaslyn L. ([id number]) -------------------------------------------------------------------------------- Vitals Details Patient Name: Ho, Shayanne L. Date of Service: 01/30/2016 10:45 AM Medical Record Patient Account Number: [id number] Number: Treating RN: Baruch Gouty, RN, BSN, Rita 1940/09/07 [phone] y.o. Other Clinician: Date of Birth/Sex: Female) Treating ROBSON, MICHAEL Primary Care Physician: Margarita Rana Physician/Extender: G Referring Physician: [name] in Treatment: 7 Vital Signs Time Taken: 10:43 Temperature (F): 97.8 Height (in): 65 Pulse (bpm): 69 Weight (lbs): 134 Respiratory Rate (breaths/min): 16 Body Mass Index (BMI): 22.3 Blood Pressure (mmHg): 132/45 Reference Range: 80 - 120 mg / dl Electronic Signature(s) Signed: 01/30/2016 2:19:20 PM By: Regan Lemming BSN, RN Entered By: Regan Lemming on 01/30/2016 10:44:00

## 2016-01-31 NOTE — Progress Notes (Signed)
[name] Visit Report for 01/30/2016 Chief Complaint Document Details Patient Name: [name], [name] Date of Service: 01/30/2016 10:45 AM Medical Record Patient Account Number: [id number] Number: Treating RN: Baruch Gouty, RN, BSN, Rita 1941-02-20 [phone] y.o. Other Clinician: Date of Birth/Sex: Female) Treating Pakou Rainbow Primary Care Physician/Extender: Lonn Georgia Physician: Referring Physician: [name] in Treatment: 7 Information Obtained from: Patient Chief Complaint Patient is here for a chronic wound on her right anterior lateral leg Electronic Signature(s) Signed: 01/31/2016 7:56:34 AM By: Linton Ham MD Entered By: Linton Ham on 01/30/2016 11:10:34 Fiorenza, Lowell L. ([id number]) -------------------------------------------------------------------------------- Debridement Details Patient Name: [name], [name] Date of Service: 01/30/2016 10:45 AM Medical Record Patient Account Number: [id number] Number: Treating RN: Baruch Gouty, RN, BSN, Rita 1940-12-30 [phone] y.o. Other Clinician: Date of Birth/Sex: Female) Treating Kylil Swopes Primary Care Physician/Extender: Lonn Georgia Physician: Referring Physician: [name] in Treatment: 7 Debridement Performed for Wound #1 Right,Lateral Lower Leg Assessment: Performed By: Physician Ricard Dillon, MD Debridement: Debridement Pre-procedure Yes Verification/Time Out Taken: Start Time: 11:00 Pain Control: Lidocaine 4% Topical Solution Level: Skin/Subcutaneous Tissue Total Area Debrided (L x 1.3 (cm) x 0.9 (cm) = 1.17 (cm) W): Tissue and other Non-Viable, Fibrin/Slough, Subcutaneous material debrided: Instrument: Curette Bleeding: Minimum Hemostasis Achieved: Pressure End Time: 11:03 Procedural Pain: 0 Post Procedural Pain: 0 Response to Treatment: Procedure was tolerated well Post Debridement Measurements of Total Wound Length: (cm) 1.3 Width: (cm)  0.9 Depth: (cm) 0.1 Volume: (cm) 0.092 Post Procedure Diagnosis Same as Pre-procedure Electronic Signature(s) Signed: 01/30/2016 2:19:20 PM By: Regan Lemming BSN, RN Signed: 01/31/2016 7:56:34 AM By: Linton Ham MD Entered By: Linton Ham on 01/30/2016 11:09:46 Diliberto, Perpetua L. ([id number]) Einspahr, Rozelle L. ([id number]) -------------------------------------------------------------------------------- HPI Details Patient Name: [name] [name], [name] Date of Service: 01/30/2016 10:45 AM Medical Record Patient Account Number: [id number] Number: Treating RN: Baruch Gouty, RN, BSN, Rita [birth year] [phone] y.o. Other Clinician: Date of Birth/Sex: Female) Treating Airiel Oblinger Primary Care Physician/Extender: Lonn Georgia Physician: Referring Physician: [name] in Treatment: 7 History of Present Illness HPI Description: 12/06/15; this is a patient who has no prior wound history and is not a diabetic. In February she was getting into her family truck and the wind pushed the door against the outer aspect of her right lower leg. I believe there was initially some swelling but not clearly a hematoma. 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 Has been using topical antibiotics on this. 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 There is already been some improvement. She has no prior history of PAD and no major history of venous insufficiency. 12/13/15; wound appears healthy, using prisma 12/20/15; patient arrives complaining of increasing pain around the wound. She also had a blister under the wound. 12/26/15; the patient arrives with the blister last week fully excised and healed however she has another floppy flaccid blister just below her wound. The wound does not appear to be infected. The cause of this is not really clear, he does walk 3 miles a day but doesn't think that the area with a blister is currently rubs the bandage at all. She said she  felt a stinging and then noticed this on the weekend. She will complete the antibiotics I gave hertomorrow 01/02/16; the patient's original wound in the right lower leg requires debridement of surface slough and surrounding eschar. The base of this then looks healthy. The area that was a  blister underneath this of uncertain etiology also appears to be better. Culture of this blister from last week was negative 01/09/16; the patient's original wound on the lower right lateral calf requires debridement of surface slough and surrounding eschar. Base of this looks healthy once again. She has another recurrent blister underneath this wound. This is his second one of these. I cultured the last one which was negative. The patient is very active and I wonder whether this has something to do with this there is no evidence of surrounding cellulitis 01/16/16; the patient's original wound on the lower right calf again requires debridement. Her subsequent wound below this which was a blister last week also has a fibrinous surface slough that requires debridement. Again the etiology of these wounds isn't completely clear to me. She does not have PAD, her ABI in this clinic was 1.17 on the right. She may have mild venous reflux but certainly does not have frank venous inflammation. She is very active telling me she walked 5 miles this weekend but states that she did not have anything that showed of caused pressure friction on the wound area. 01/23/16 both the patient's wounds appear to be improved. Her subsequent wound which was a blister also appears to be smaller. 01/30/16; the inferior wound which was a secondary wound has resolved. Her original wound appears better. Still using Iodoflex Electronic Signature(s) Signed: 01/31/2016 7:56:34 AM By: Linton Ham MD Kazee, [name] ([id number]) Entered By: Linton Ham on 01/30/2016 11:11:27 Rohm, [name]  ([id number]) -------------------------------------------------------------------------------- Physical Exam Details Patient Name: [name] [name], [name] Date of Service: 01/30/2016 10:45 AM Medical Record Patient Account Number: [id number] Number: Treating RN: Baruch Gouty, RN, BSN, Rita 1940/09/10 [phone] y.o. Other Clinician: Date of Birth/Sex: Female) Treating Shafin Pollio Primary Care Physician/Extender: Lonn Georgia Physician: Referring Physician: [name] in Treatment: 7 Notes Wound exam; the area is on the right medial lower leg. 2 small open areas the lower one of which is closed. Her original wound is slightly debridement of the surface slough and subcutaneous tissue. The tissue underneath this looks very healthy and I think is on its way to closure Electronic Signature(s) Signed: 01/31/2016 7:56:34 AM By: Linton Ham MD Entered By: Linton Ham on 01/30/2016 11:12:32 Christon, Edyn L. ([id number]) -------------------------------------------------------------------------------- Physician Orders Details Patient Name: Lipps, Velma L. Date of Service: 01/30/2016 10:45 AM Medical Record Patient Account Number: [id number] Number: Treating RN: Baruch Gouty, RN, BSN, Rita 1941/06/03 [phone] y.o. Other Clinician: Date of Birth/Sex: Female) Treating Niambi Smoak Primary Care Physician/Extender: Lonn Georgia Physician: Referring Physician: [name] in Treatment: 7 Verbal / Phone Orders: Yes Clinician: Afful, RN, BSN, Rita Read Back and Verified: Yes Diagnosis Coding Wound Cleansing Wound #1 Right,Lateral Lower Leg o Cleanse wound with mild soap and water o May Shower, gently pat wound dry prior to applying new dressing. o May shower with protection. Anesthetic Wound #1 Right,Lateral Lower Leg o Topical Lidocaine 4% cream applied to wound bed prior to debridement Skin Barriers/Peri-Wound Care Wound #1 Right,Lateral Lower Leg o  Barrier cream Primary Wound Dressing Wound #1 Right,Lateral Lower Leg o Iodoflex Secondary Dressing Wound #1 Right,Lateral Lower Leg o Boardered Foam Dressing Dressing Change Frequency Wound #1 Right,Lateral Lower Leg o Change dressing every other day. Follow-up Appointments Wound #1 Right,Lateral Lower Leg o Return Appointment in 1 week. Additional Orders / Instructions Wound #1 Right,Lateral Lower Leg Minogue, Haylei L. ([id number]) o Increase protein intake. o Activity as tolerated Electronic Signature(s) Signed: 01/30/2016 2:19:20 PM  By: Regan Lemming BSN, RN Signed: 01/31/2016 7:56:34 AM By: Linton Ham MD Entered By: Regan Lemming on 01/30/2016 11:03:32 Stansel, Pachia L. ([id number]) -------------------------------------------------------------------------------- Problem List Details Patient Name: [name] [name], [name] Date of Service: 01/30/2016 10:45 AM Medical Record Patient Account Number: [id number] Number: Treating RN: Baruch Gouty, RN, BSN, Rita [birth year] [phone] y.o. 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 ([id number]) -------------------------------------------------------------------------------- Progress Note Details Patient Name: [name] [name], [name] Date of Service: 01/30/2016 10:45 AM Medical Record Patient Account Number: [id number] Number: Treating RN: Baruch Gouty, RN, BSN, Rita [birth year] [phone] y.o. Other  Clinician: Date of Birth/Sex: Female) Treating Nazaret Chea Primary Care Physician/Extender: Lonn Georgia Physician: Referring Physician: [name] in Treatment: 7 Subjective Chief Complaint Information obtained from Patient Patient is here for a chronic wound on her right anterior lateral leg History of Present Illness (HPI) 12/06/15; this is a patient who has no prior wound history and is not a diabetic. In February she was getting into her family truck and the wind pushed the door against the outer aspect of her right lower leg. I believe there was initially some swelling but not clearly a hematoma. 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 Has been using topical antibiotics on this. 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 There is already been some improvement. She has no prior history of PAD and no major history of venous insufficiency. 12/13/15; wound appears healthy, using prisma 12/20/15; patient arrives complaining of increasing pain around the wound. She also had a blister under the wound. 12/26/15; the patient arrives with the blister last week fully excised and healed however she has another floppy flaccid blister just below her wound. The wound does not appear to be infected. The cause of this is not really clear, he does walk 3 miles a day but doesn't think that the area with a blister is currently rubs the bandage at all. She said she felt a stinging and then noticed this on the weekend. She will complete the antibiotics I gave hertomorrow 01/02/16; the patient's original wound in the right lower leg requires debridement of surface slough and surrounding eschar. The base of this then looks healthy. The area that was a blister underneath this of uncertain etiology also appears to be better. Culture of this blister from last week was negative 01/09/16; the patient's original wound on the lower right lateral calf requires  debridement of surface slough and surrounding eschar. Base of this looks healthy once again. She has another recurrent blister underneath this wound. This is his second one of these. I cultured the last one which was negative. The patient is very active and I wonder whether this has something to do with this there is no evidence of surrounding cellulitis 01/16/16; the patient's original wound on the lower right calf again requires debridement. Her subsequent wound below this which was a blister last week also has a fibrinous surface slough that requires debridement. Again the etiology of these wounds isn't completely clear to me. She does not have PAD, her ABI in  this clinic was 1.17 on the right. She may have mild venous reflux but certainly does not have frank venous inflammation. She is very active telling me she walked 5 miles this weekend but states that she did not have anything that showed of caused pressure friction on the wound area. 01/23/16 both the patient's wounds appear to be improved. Her subsequent wound which was a blister also appears to be smaller. Helman, Jaskirat L. ([id number]) 01/30/16; the inferior wound which was a secondary wound has resolved. Her original wound appears better. Still using Iodoflex Objective Constitutional Vitals Time Taken: 10:43 AM, Height: 65 in, Weight: 134 lbs, BMI: 22.3, Temperature: 97.8 F, Pulse: 69 bpm, Respiratory Rate: 16 breaths/min, Blood Pressure: 132/45 mmHg. Integumentary (Hair, Skin) Wound #1 status is Open. Original cause of wound was Trauma. The wound is located on the Right,Lateral Lower Leg. The wound measures 1.3cm length x 0.9cm width x 0.1cm depth; 0.919cm^2 area and 0.092cm^3 volume. The wound is limited to skin breakdown. There is no tunneling or undermining noted. There is a medium amount of serosanguineous drainage noted. The wound margin is distinct with the outline attached to the wound base. There is small (1-33%) pink, pale  granulation within the wound bed. There is a large (67-100%) amount of necrotic tissue within the wound bed including Adherent Slough. The periwound skin appearance exhibited: Localized Edema, Moist. The periwound skin appearance did not exhibit: Callus, Crepitus, Excoriation, Fluctuance, Friable, Induration, Rash, Scarring, Dry/Scaly, Maceration, Atrophie Blanche, Cyanosis, Ecchymosis, Hemosiderin Staining, Mottled, Pallor, Rubor, Erythema. Periwound temperature was noted as No Abnormality. The periwound has tenderness on palpation. Wound #2 status is Healed - Epithelialized. Original cause of wound was Blister. The wound is located on the Right,Distal Lower Leg. The wound measures 0cm length x 0cm width x 0cm depth; 0cm^2 area and 0cm^3 volume. The wound is limited to skin breakdown. There is no tunneling or undermining noted. There is a none present amount of drainage noted. The wound margin is distinct with the outline attached to the wound base. There is no granulation within the wound bed. There is no necrotic tissue within the wound bed. The periwound skin appearance exhibited: Localized Edema, Dry/Scaly. The periwound skin appearance did not exhibit: Callus, Crepitus, Excoriation, Fluctuance, Friable, Induration, Rash, Scarring, Maceration, Moist, Atrophie Blanche, Cyanosis, Ecchymosis, Hemosiderin Staining, Mottled, Pallor, Rubor, Erythema. Periwound temperature was noted as No Abnormality. 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 ([id number]) Procedures Wound #1 Wound #1 is a Skin Tear located on the Right,Lateral Lower Leg . There was a Skin/Subcutaneous Tissue Debridement [id number]) debridement with total area of 1.17 sq cm performed by Ricard Dillon, MD. with the following instrument(s): Curette to remove Non-Viable tissue/material  including Fibrin/Slough and Subcutaneous after achieving pain control using Lidocaine 4% Topical Solution. A time out was conducted prior to the start of the procedure. A Minimum amount of bleeding was controlled with Pressure. The procedure was tolerated well with a pain level of 0 throughout and a pain level of 0 following the procedure. Post Debridement Measurements: 1.3cm length x 0.9cm width x 0.1cm depth; 0.092cm^3 volume. Post procedure Diagnosis Wound #1: Same as Pre-Procedure Plan Wound Cleansing: Wound #1 Right,Lateral Lower Leg: Cleanse wound with mild soap and water May Shower, gently pat wound dry prior to applying new dressing. May shower with protection. Anesthetic: Wound #1 Right,Lateral Lower Leg: Topical Lidocaine 4% cream applied to wound  bed prior to debridement Skin Barriers/Peri-Wound Care: Wound #1 Right,Lateral Lower Leg: Barrier cream Primary Wound Dressing: Wound #1 Right,Lateral Lower Leg: Iodoflex Secondary Dressing: Wound #1 Right,Lateral Lower Leg: Boardered Foam Dressing Dressing Change Frequency: Wound #1 Right,Lateral Lower Leg: Change dressing every other day. Follow-up Appointments: Wound #1 Right,Lateral Lower Leg: Return Appointment in 1 week. Additional Orders / Instructions: Wound #1 Right,Lateral Lower Leg: Increase protein intake. Activity as tolerated Beverley, Odis L. ([id number]) #1 we will continue Iodoflex to the improved, original wound. Border foam Engineer, maintenance) Signed: 01/31/2016 7:56:34 AM By: Linton Ham MD Entered By: Linton Ham on 01/30/2016 11:13:02 Whitter, Natalie L. ([id number]) -------------------------------------------------------------------------------- SuperBill Details Patient Name: [name] [name], [name] Date of Service: 01/30/2016 Medical Record Patient Account Number: [id number] Number: Treating RN: Baruch Gouty, RN, BSN, Rita Jul 23, 1941 [phone] y.o. Other Clinician: Date of Birth/Sex: Female) Treating  Alvey Brockel Primary Care Physician/Extender: Lonn Georgia Physician: Suella Grove in Treatment: 7 Referring Physician: Margarita Rana Diagnosis Coding ICD-10 Codes Code Description [id number] Non-pressure chronic ulcer of right calf with necrosis of muscle I87.2 Venous insufficiency (chronic) (peripheral) Facility Procedures CPT4 Code Description: [id number] - DEB SUBQ TISSUE 20 SQ CM/< ICD-10 Description Diagnosis L97.213 Non-pressure chronic ulcer of right calf with necro Modifier: sis of muscl Quantity: 1 e Physician Procedures CPT4 Code Description: [id number] [id number] - WC PHYS SUBQ TISS 20 SQ CM ICD-10 Description Diagnosis L97.213 Non-pressure chronic ulcer of right calf with necro Modifier: sis of muscle Quantity: 1 Electronic Signature(s) Signed: 01/31/2016 7:56:34 AM By: Linton Ham MD Entered By: Linton Ham on 01/30/2016 11:13:30

## 2016-02-06 ENCOUNTER — Encounter: Payer: PPO | Admitting: Internal Medicine

## 2016-02-06 ENCOUNTER — Other Ambulatory Visit
Admission: RE | Admit: 2016-02-06 | Discharge: 2016-02-06 | Disposition: A | Discharge status: Home / Self Care | Payer: PPO | Source: Ambulatory Visit | Attending: Neurosurgery | Admitting: Neurosurgery

## 2016-02-06 DIAGNOSIS — Z029 Encounter for administrative examinations, unspecified: Secondary | ICD-10-CM | POA: Diagnosis present

## 2016-02-06 DIAGNOSIS — S81811A Laceration without foreign body, right lower leg, initial encounter: Secondary | ICD-10-CM | POA: Diagnosis not present

## 2016-02-06 DIAGNOSIS — L97213 Non-pressure chronic ulcer of right calf with necrosis of muscle: Secondary | ICD-10-CM | POA: Diagnosis not present

## 2016-02-06 NOTE — Progress Notes (Signed)
LIMOR, MAHIN ([id number]) Visit Report for 02/06/2016 Arrival Information Details Patient Name: [name] [name], [name] Date of Service: 02/06/2016 12:45 PM Medical Record Patient Account Number: [id number] Number: Treating RN: Baruch Gouty, RN, BSN, Rita 11/20/1940 [phone] y.o. Other Clinician: Date of Birth/Sex: Female) Treating ROBSON, [place] Primary Care Physician: Margarita Rana Physician/Extender: G Referring Physician: [name] in Treatment: 8 Visit Information History Since Last Visit Added or deleted any medications: No Patient Arrived: Ambulatory Any new allergies or adverse reactions: No Arrival Time: 12:49 Had a fall or experienced change in No Accompanied By: SElf activities of daily living that may affect Transfer Assistance: None risk of falls: Patient Identification Verified: Yes Signs or symptoms of abuse/neglect since last No Secondary Verification Process Yes visito Completed: Hospitalized since last visit: No Patient Requires Transmission-Based No Has Dressing in Place as Prescribed: Yes Precautions: Pain Present Now: No Patient Has Alerts: No Electronic Signature(s) Signed: 02/06/2016 4:40:39 PM By: Regan Lemming BSN, RN Entered By: Regan Lemming on 02/06/2016 12:51:09 [name] [name], [name] -------------------------------------------------------------------------------- Clinic Level of Care Assessment Details Patient Name: [name] Date of Service: 02/06/2016 12:45 PM Medical Record Patient Account Number: [id number] Number: Treating RN: Baruch Gouty, RN, BSN, Rita Mar 08, 1941 [phone] y.o. Other Clinician: Date of Birth/Sex: Female) Treating ROBSON, [place] Primary Care Physician: Margarita Rana Physician/Extender: G Referring Physician: [name] in Treatment: 8 Clinic Level of Care Assessment Items TOOL 4 Quantity Score []  - Use when only an EandM is performed on FOLLOW-UP visit 0 ASSESSMENTS - Nursing Assessment /  Reassessment X - Reassessment of Co-morbidities (includes updates in patient status) 1 10 X - Reassessment of Adherence to Treatment Plan 1 5 ASSESSMENTS - Wound and Skin Assessment / Reassessment X - Simple Wound Assessment / Reassessment - one wound 1 5 []  - Complex Wound Assessment / Reassessment - multiple wounds 0 []  - Dermatologic / Skin Assessment (not related to wound area) 0 ASSESSMENTS - Focused Assessment []  - Circumferential Edema Measurements - multi extremities 0 []  - Nutritional Assessment / Counseling / Intervention 0 X - Lower Extremity Assessment (monofilament, tuning fork, pulses) 1 5 []  - Peripheral Arterial Disease Assessment (using hand held doppler) 0 ASSESSMENTS - Ostomy and/or Continence Assessment and Care []  - Incontinence Assessment and Management 0 []  - Ostomy Care Assessment and Management (repouching, etc.) 0 PROCESS - Coordination of Care X - Simple Patient / Family Education for ongoing care 1 15 []  - Complex (extensive) Patient / Family Education for ongoing care 0 X - Staff obtains Programmer, systems, Records, Test Results / Process Orders 1 10 []  - Staff telephones HHA, Nursing Homes / Clarify orders / etc 0 [name] []  - Routine Transfer to another Facility (non-emergent condition) 0 []  - Routine Hospital Admission (non-emergent condition) 0 []  - New Admissions / Biomedical engineer / Ordering NPWT, Apligraf, etc. 0 []  - Emergency Hospital Admission (emergent condition) 0 []  - Simple Discharge Coordination 0 []  - Complex (extensive) Discharge Coordination 0 PROCESS - Special Needs []  - Pediatric / Minor Patient Management 0 []  - Isolation Patient Management 0 []  - Hearing / Language / Visual special needs 0 []  - Assessment of Community assistance (transportation, D/C planning, etc.) 0 []  - Additional assistance / Altered mentation 0 []  - Support Surface(s) Assessment (bed, cushion, seat, etc.) 0 INTERVENTIONS - Wound Cleansing /  Measurement X - Simple Wound Cleansing - one wound 1 5 []  - Complex Wound Cleansing - multiple wounds 0 X - Wound Imaging (photographs -  any number of wounds) 1 5 []  - Wound Tracing (instead of photographs) 0 X - Simple Wound Measurement - one wound 1 5 []  - Complex Wound Measurement - multiple wounds 0 INTERVENTIONS - Wound Dressings X - Small Wound Dressing one or multiple wounds 1 10 []  - Medium Wound Dressing one or multiple wounds 0 []  - Large Wound Dressing one or multiple wounds 0 []  - Application of Medications - topical 0 []  - Application of Medications - injection 0 [name] [name], [name] INTERVENTIONS - Miscellaneous []  - External ear exam 0 X - Specimen Collection (cultures, biopsies, blood, body fluids, etc.) 1 5 []  - Specimen(s) / Culture(s) sent or taken to Lab for analysis 0 []  - Patient Transfer (multiple staff / Harrel Lemon Lift / Similar devices) 0 []  - Simple Staple / Suture removal (25 or less) 0 []  - Complex Staple / Suture removal (26 or more) 0 []  - Hypo / Hyperglycemic Management (close monitor of Blood Glucose) 0 []  - Ankle / Brachial Index (ABI) - do not check if billed separately 0 X - Vital Signs 1 5 Has the patient been seen at the hospital within the last three years: Yes Total Score: 85 Level Of Care: New/Established - Level 3 Electronic Signature(s) Signed: 02/06/2016 4:27:17 PM By: Regan Lemming BSN, RN Entered By: Regan Lemming on 02/06/2016 16:27:17 [name] [name], [name] -------------------------------------------------------------------------------- Encounter Discharge Information Details Patient Name: [name] Date of Service: 02/06/2016 12:45 PM Medical Record Patient Account Number: [id number] Number: Treating RN: Baruch Gouty, RN, BSN, Rita [birth year] [phone] y.o. Other Clinician: Date of Birth/Sex: Female) Treating ROBSON, MICHAEL Primary Care Physician: Margarita Rana Physician/Extender: G Referring Physician: [name] in Treatment: 8 Encounter Discharge Information Items Discharge Pain Level: 0 Discharge Condition: Stable Ambulatory Status: Ambulatory Discharge Destination: Home Transportation: Private Auto Accompanied By: self Schedule Follow-up Appointment: No Medication Reconciliation completed and provided to Patient/Care No Airon Sahni: Provided on Clinical Summary of Care: 02/06/2016 Form Type Recipient Paper Patient NB Electronic Signature(s) Signed: 02/06/2016 1:15:04 PM By: Ruthine Dose Entered By: Ruthine Dose on 02/06/2016 13:15:04 [name] [name], [name] -------------------------------------------------------------------------------- Lower Extremity Assessment Details Patient Name: Mcghie, Eryanna [name] Date of Service: 02/06/2016 12:45 PM Medical Record Patient Account Number: [id number] Number: Treating RN: Baruch Gouty, RN, BSN, Rita 1941/07/11 [phone] y.o. Other Clinician: Date of Birth/Sex: Female) Treating ROBSON, MICHAEL Primary Care Physician: Margarita Rana Physician/Extender: G Referring Physician: [name] in Treatment: 8 Vascular Assessment Claudication: Claudication Assessment [Right:None] Pulses: Posterior Tibial Dorsalis Pedis Palpable: [Right:Yes] Extremity colors, hair growth, and conditions: Extremity Color: [Right:Normal] Hair Growth on Extremity: [Right:No] Temperature of Extremity: [Right:Warm] Capillary Refill: [Right:< 3 seconds] Toe Nail Assessment Left: Right: Thick: No Discolored: No Deformed: No Improper Length and Hygiene: No Electronic Signature(s) Signed: 02/06/2016 4:40:39 PM By: Regan Lemming BSN, RN Entered By: Regan Lemming on 02/06/2016 12:59:22 [name] [name], [name] -------------------------------------------------------------------------------- Multi Wound Chart Details Patient Name: [name] [name], [name] Date of Service: 02/06/2016 12:45 PM Medical Record Patient Account Number:  [id number] Number: Treating RN: Baruch Gouty, RN, BSN, Rita 1941/07/12 [phone] y.o. Other Clinician: Date of Birth/Sex: Female) Treating ROBSON, [place] Primary Care Physician: Margarita Rana Physician/Extender: G Referring Physician: [name] in Treatment: 8 Vital Signs Height(in): 65 Pulse(bpm): 72 Weight(lbs): 134 Blood Pressure 123/88 (mmHg): Body Mass Index(BMI): 22 Temperature(F): 97.6 Respiratory Rate 16 (breaths/min): Photos: [1:No Photos] [N/A:N/A] Wound Location: [1:Right Lower Leg - Lateral] [N/A:N/A] Wounding Event: [1:Trauma] [N/A:N/A] Primary Etiology: [1:Skin Tear] [N/A:N/A] Comorbid History: [1:Cataracts, Chronic Obstructive  Pulmonary Disease (COPD), Hypertension, Osteoarthritis, Neuropathy, Confinement Anxiety] [N/A:N/A] Date Acquired: [1:10/11/2015] [N/A:N/A] Weeks of Treatment: [1:8] [N/A:N/A] Wound Status: [1:Open] [N/A:N/A] Measurements [name] x W x D 0.2x0.1x0.4 [N/A:N/A] (cm) Area (cm) : [1:0.016] [N/A:N/A] Volume (cm) : [1:0.006] [N/A:N/A] % Reduction in Area: [1:99.50%] [N/A:N/A] % Reduction in Volume: 98.10% [N/A:N/A] Classification: [1:Full Thickness Without Exposed Support Structures] [N/A:N/A] Exudate Amount: [1:Medium] [N/A:N/A] Exudate Type: [1:Serosanguineous] [N/A:N/A] Exudate Color: [1:red, brown] [N/A:N/A] Wound Margin: [1:Distinct, outline attached] [N/A:N/A] Granulation Amount: [1:Large (67-100%)] [N/A:N/A] Granulation Quality: Pink, Pale N/A N/A Necrotic Amount: None Present (0%) N/A N/A Exposed Structures: Fascia: No N/A N/A Fat: No Tendon: No Muscle: No Joint: No Bone: No Limited to Skin Breakdown Epithelialization: Large (67-100%) N/A N/A Periwound Skin Texture: Edema: Yes N/A N/A Excoriation: No Induration: No Callus: No Crepitus: No Fluctuance: No Friable: No Rash: No Scarring: No Periwound Skin Moist: Yes N/A N/A Moisture: Maceration: No Dry/Scaly: No Periwound Skin Color: Atrophie Blanche: No N/A  N/A Cyanosis: No Ecchymosis: No Erythema: No Hemosiderin Staining: No Mottled: No Pallor: No Rubor: No Temperature: No Abnormality N/A N/A Tenderness on Yes N/A N/A Palpation: Wound Preparation: Ulcer Cleansing: N/A N/A Rinsed/Irrigated with Saline Topical Anesthetic Applied: Other: lidocaine 4% Treatment Notes Electronic Signature(s) Signed: 02/06/2016 4:40:39 PM By: Regan Lemming BSN, RN Entered By: Regan Lemming on 02/06/2016 13:05:01 [name] [name], [name] -------------------------------------------------------------------------------- Multi-Disciplinary Care Plan Details Patient Name: [name] [name], [name] Date of Service: 02/06/2016 12:45 PM Medical Record Patient Account Number: [id number] Number: Treating RN: Baruch Gouty, RN, BSN, Rita 08/01/1941 [phone] y.o. Other Clinician: Date of Birth/Sex: Female) Treating ROBSON, MICHAEL Primary Care Physician: Margarita Rana Physician/Extender: G Referring Physician: [name] in Treatment: 8 Active Inactive Orientation to the Wound Care Program Nursing Diagnoses: Knowledge deficit related to the wound healing center program Goals: Patient/caregiver will verbalize understanding of the [facility] Program Date Initiated: 12/06/2015 Goal Status: Active Interventions: Provide education on orientation to the wound center Notes: Venous Leg Ulcer Nursing Diagnoses: Knowledge deficit related to disease process and management Potential for venous Insuffiency (use before diagnosis confirmed) Goals: Non-invasive venous studies are completed as ordered Date Initiated: 12/06/2015 Goal Status: Active Patient will maintain optimal edema control Date Initiated: 12/06/2015 Goal Status: Active Patient/caregiver will verbalize understanding of disease process and disease management Date Initiated: 12/06/2015 Goal Status: Active Verify adequate tissue perfusion prior to therapeutic compression application Date  Initiated: 12/06/2015 Goal Status: Active Rodenberg, Kelis [name] ([id number]) Interventions: Assess peripheral edema status every visit. Compression as ordered Provide education on venous insufficiency Treatment Activities: Non-invasive vascular studies : 12/13/2015 Therapeutic compression applied : 12/13/2015 Notes: Wound/Skin Impairment Nursing Diagnoses: Impaired tissue integrity Knowledge deficit related to smoking impact on wound healing Knowledge deficit related to ulceration/compromised skin integrity Goals: Patient/caregiver will verbalize understanding of skin care regimen Date Initiated: 12/06/2015 Goal Status: Active Ulcer/skin breakdown will have a volume reduction of 30% by week 4 Date Initiated: 12/06/2015 Goal Status: Active Ulcer/skin breakdown will have a volume reduction of 50% by week 8 Date Initiated: 12/06/2015 Goal Status: Active Ulcer/skin breakdown will have a volume reduction of 80% by week 12 Date Initiated: 12/06/2015 Goal Status: Active Ulcer/skin breakdown will heal within 14 weeks Date Initiated: 12/06/2015 Goal Status: Active Interventions: Assess patient/caregiver ability to perform ulcer/skin care regimen upon admission and as needed Assess ulceration(s) every visit Provide education on ulcer and skin care Treatment Activities: Referred to DME Calia Napp for dressing supplies : 12/13/2015 Skin care regimen initiated : 12/13/2015 Topical wound management initiated : 12/13/2015 [name] [name], [name]  [name] ([id number]) Notes: Electronic Signature(s) Signed: 02/06/2016 4:40:39 PM By: Regan Lemming BSN, RN Entered By: Regan Lemming on 02/06/2016 13:04:51 [name] [name], [name] -------------------------------------------------------------------------------- Pain Assessment Details Patient Name: Sturdivant, Kamrie [name] Date of Service: 02/06/2016 12:45 PM Medical Record Patient Account Number: [id number] Number: Treating RN: Baruch Gouty, RN, BSN, Rita 1940/12/29 [phone] y.o. Other  Clinician: Date of Birth/Sex: Female) Treating ROBSON, MICHAEL Primary Care Physician: Margarita Rana Physician/Extender: G Referring Physician: [name] in Treatment: 8 Active Problems Location of Pain Severity and Description of Pain Patient Has Paino No Site Locations With Dressing Change: No Pain Management and Medication Current Pain Management: Electronic Signature(s) Signed: 02/06/2016 4:40:39 PM By: Regan Lemming BSN, RN Entered By: Regan Lemming on 02/06/2016 12:52:06 [name] [name], [name] -------------------------------------------------------------------------------- Patient/Caregiver Education Details Patient Name: Casagrande, Jatara [name] Date of Service: 02/06/2016 12:45 PM Medical Record Patient Account Number: [id number] Number: Treating RN: Baruch Gouty, RN, BSN, Rita 04-Oct-1940 [phone] y.o. Other Clinician: Date of Birth/Gender: Female) Treating ROBSON, MICHAEL Primary Care Physician: Margarita Rana Physician/Extender: G Referring Physician: [name] in Treatment: 8 Education Assessment Education Provided To: Patient Education Topics Provided Venous: Methods: Explain/Verbal Responses: State content correctly Welcome To The [facility]: Methods: Explain/Verbal Responses: State content correctly Wound/Skin Impairment: Methods: Explain/Verbal Responses: State content correctly Electronic Signature(s) Signed: 02/06/2016 4:40:39 PM By: Regan Lemming BSN, RN Entered By: Regan Lemming on 02/06/2016 13:12:10 [name] [name], [name] -------------------------------------------------------------------------------- Wound Assessment Details Patient Name: Stodghill, Suvi [name] Date of Service: 02/06/2016 12:45 PM Medical Record Patient Account Number: [id number] Number: Treating RN: Baruch Gouty, RN, BSN, Rita 18-Jan-1941 [phone] y.o. Other Clinician: Date of Birth/Sex: Female) Treating ROBSON, MICHAEL Primary Care Physician: Margarita Rana Physician/Extender: G Referring Physician: [name] in Treatment: 8 Wound Status Wound Number: 1 Primary Skin Tear Etiology: Wound Location: Right Lower Leg - Lateral Wound Open Wounding Event: Trauma Status: Date Acquired: 10/11/2015 Comorbid Cataracts, Chronic Obstructive Weeks Of Treatment: 8 History: Pulmonary Disease (COPD), Clustered Wound: No Hypertension, Osteoarthritis, Neuropathy, Confinement Anxiety Photos Photo Uploaded By: Regan Lemming on 02/06/2016 16:37:25 Wound Measurements Length: (cm) 0.2 Width: (cm) 0.1 Depth: (cm) 0.4 Area: (cm) 0.016 Volume: (cm) 0.006 % Reduction in Area: 99.5% % Reduction in Volume: 98.1% Epithelialization: Large (67-100%) Tunneling: No Undermining: No Wound Description Full Thickness Without Exposed Classification: Support Structures Wound Margin: Distinct, outline attached Exudate Medium Amount: Exudate Type: Serosanguineous Exudate Color: red, brown Blitzer, Lakesha [name] ([id number]) Foul Odor After Cleansing: No Wound Bed Granulation Amount: Large (67-100%) Exposed Structure Granulation Quality: Pink, Pale Fascia Exposed: No Necrotic Amount: None Present (0%) Fat Layer Exposed: No Tendon Exposed: No Muscle Exposed: No Joint Exposed: No Bone Exposed: No Limited to Skin Breakdown Periwound Skin Texture Texture Color No Abnormalities Noted: No No Abnormalities Noted: No Callus: No Atrophie Blanche: No Crepitus: No Cyanosis: No Excoriation: No Ecchymosis: No Fluctuance: No Erythema: No Friable: No Hemosiderin Staining: No Induration: No Mottled: No Localized Edema: Yes Pallor: No Rash: No Rubor: No Scarring: No Temperature / Pain Moisture Temperature: No Abnormality No Abnormalities Noted: No Tenderness on Palpation: Yes Dry / Scaly: No Maceration: No Moist: Yes Wound Preparation Ulcer Cleansing: Rinsed/Irrigated with Saline Topical Anesthetic Applied: Other: lidocaine  4%, Treatment Notes Wound #1 (Right, Lateral Lower Leg) 1. Cleansed with: Clean wound with Normal Saline 4. Dressing Applied: Iodoflex 5. Secondary [place] Signature(s) Signed: 02/06/2016 4:40:39 PM By: Regan Lemming BSN, RN Entered By: Regan Lemming on 02/06/2016 13:04:35 Claud, Jaydeen [name] ([id number]) -------------------------------------------------------------------------------- Vitals  Details Patient Name: Arenson, Tasnim [name] Date of Service: 02/06/2016 12:45 PM Medical Record Patient Account Number: [id number] Number: Treating RN: Baruch Gouty, RN, BSN, Rita [birth year] [phone] y.o. Other Clinician: Date of Birth/Sex: Female) Treating ROBSON, MICHAEL Primary Care Physician: Margarita Rana Physician/Extender: G Referring Physician: [name] in Treatment: 8 Vital Signs Time Taken: 12:52 Temperature (F): 97.6 Height (in): 65 Pulse (bpm): 72 Weight (lbs): 134 Respiratory Rate (breaths/min): 16 Body Mass Index (BMI): 22.3 Blood Pressure (mmHg): 123/88 Reference Range: 80 - 120 mg / dl Electronic Signature(s) Signed: 02/06/2016 4:40:39 PM By: Regan Lemming BSN, RN Entered By: Regan Lemming on 02/06/2016 12:52:36

## 2016-02-06 NOTE — Progress Notes (Signed)
[name], [name] ([id number]) Visit Report for 02/06/2016 Chief Complaint Document Details Patient Name: [name], [name] Date of Service: 02/06/2016 12:45 PM Medical Record Patient Account Number: [id number] Number: Treating RN: Baruch Gouty, RN, BSN, [name] 1941/04/15 [phone] y.o. Other Clinician: Date of Birth/Sex: Female) Treating [name] [name] Primary Care Physician/Extender: [name] [name] Physician: Referring Physician: Holland [name] in Treatment: 8 Information Obtained from: Patient Chief Complaint Patient is here for a chronic wound on her right anterior lateral leg Electronic Signature(s) Signed: 02/06/2016 4:35:27 PM By: [name] Ham MD Entered By: [name] [name] on 02/06/2016 13:29:32 [name], [place]. ([id number]) -------------------------------------------------------------------------------- HPI Details Patient Name: [name], [name] Date of Service: 02/06/2016 12:45 PM Medical Record Patient Account Number: [id number] Number: Treating RN: Baruch Gouty, RN, BSN, [name] May 02, 1941 [phone] y.o. Other Clinician: Date of Birth/Sex: Female) Treating [name] [name] Primary Care Physician/Extender: [name] [name] Physician: Referring Physician: Holland [name] in Treatment: 8 History of Present Illness HPI Description: 12/06/15; this is a patient who has no prior wound history and is not a diabetic. In February she was getting into her family truck and the wind pushed the door against the outer aspect of her right lower leg. I believe there was initially some swelling but not clearly a hematoma. 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 Has been using topical antibiotics on this. 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 There is already been some improvement. She has no prior history of PAD and no major history of venous insufficiency. 12/13/15; wound appears healthy, using prisma 12/20/15; patient arrives complaining of  increasing pain around the wound. She also had a blister under the wound. 12/26/15; the patient arrives with the blister last week fully excised and healed however she has another floppy flaccid blister just below her wound. The wound does not appear to be infected. The cause of this is not really clear, he does walk 3 miles a day but doesn't think that the area with a blister is currently rubs the bandage at all. She said she felt a stinging and then noticed this on the weekend. She will complete the antibiotics I gave hertomorrow 01/02/16; the patient's original wound in the right lower leg requires debridement of surface slough and surrounding eschar. The base of this then looks healthy. The area that was a blister underneath this of uncertain etiology also appears to be better. Culture of this blister from last week was negative 01/09/16; the patient's original wound on the lower right lateral calf requires debridement of surface slough and surrounding eschar. Base of this looks healthy once again. She has another recurrent blister underneath this wound. This is his second one of these. I cultured the last one which was negative. The patient is very active and I wonder whether this has something to do with this there is no evidence of surrounding cellulitis 01/16/16; the patient's original wound on the lower right calf again requires debridement. Her subsequent wound below this which was a blister last week also has a fibrinous surface slough that requires debridement. Again the etiology of these wounds isn't completely clear to me. She does not have PAD, her ABI in this clinic was 1.17 on the right. She may have mild venous reflux but certainly does not have frank venous inflammation. She is very active telling me she walked 5 miles this weekend but states that she did not have anything that showed of caused pressure friction on the wound area.  01/23/16 both the patient's wounds appear to be  improved. Her subsequent wound which was a blister also appears to be smaller. 01/30/16; the inferior wound which was a secondary wound has resolved. Her original wound appears better. Still using Iodoflex 02/06/16; surprisingly the patient's wound is almost totally closed over except for a small probing, I'll on the medial aspect of the circumference of the wound. The issue here is she has never had tunneling in this, most of this was always superficial. This has a clear, nonpurulent, looking drainage Rensch, [name] Electronic Signature(s) Signed: 02/06/2016 4:35:27 PM By: [name] Ham MD Entered By: [name] [name] on 02/06/2016 13:31:12 [name], [name] ([id number]) -------------------------------------------------------------------------------- Physical Exam Details Patient Name: [name], [name] Date of Service: 02/06/2016 12:45 PM Medical Record Patient Account Number: [id number] Number: Treating RN: Baruch Gouty, RN, BSN, [name] 1940-11-17 [phone] y.o. Other Clinician: Date of Birth/Sex: Female) Treating [name] [name] Primary Care Physician/Extender: [name] [name] Physician: Referring Physician: Holland [name] in Treatment: 8 Constitutional Sitting or standing Blood Pressure is within target range for patient.. Patient is hypertensive.. Pulse regular and within target range for patient.Marland Kitchen Respirations regular, non-labored and within target range.. Temperature is normal and within the target range for the patient.. Eyes Conjunctivae clear. No discharge.Marland Kitchen Respiratory Respiratory effort is easy and symmetric bilaterally. Rate is normal at rest and on room air.. Cardiovascular Pedal pulses palpable and strong bilaterally.. Extremities are free of varicosities, clubbing or edema. Peripheral pulses strong and equal. Capillary refill < 3 seconds.. Lymphatic No palpable lymphadenopathy in the popliteal or inguinal areas. Notes Wound exam; the area on the right  medial leg is fully epithelialized except for a small tunnel going down 0.5 cm on the medial aspect of her original wound. With careful palpation there is clear drainage, this does not look purulent. There is no evidence of soft tissue infection. Nevertheless I've gone ahead and cultured this Electronic Signature(s) Signed: 02/06/2016 4:35:27 PM By: [name] Ham MD Entered By: [name] [name] on 02/06/2016 13:33:26 [name], [name] ([id number]) -------------------------------------------------------------------------------- Physician Orders Details Patient Name: [name], [name] Date of Service: 02/06/2016 12:45 PM Medical Record Patient Account Number: [id number] Number: Treating RN: Baruch Gouty, RN, BSN, [name] 11/26/1940 [phone] y.o. Other Clinician: Date of Birth/Sex: Female) Treating Karlin Heilman Primary Care Physician/Extender: [name] [name] Physician: Referring Physician: Holland [name] in Treatment: 8 Verbal / Phone Orders: Yes Clinician: Afful, RN, BSN, [name] Read Back and Verified: Yes Diagnosis Coding Wound Cleansing Wound #1 Right,Lateral Lower Leg o Cleanse wound with mild soap and water o May Shower, gently pat wound dry prior to applying new dressing. o May shower with protection. Anesthetic Wound #1 Right,Lateral Lower Leg o Topical Lidocaine 4% cream applied to wound bed prior to debridement Skin Barriers/Peri-Wound Care Wound #1 Right,Lateral Lower Leg o Barrier cream Primary Wound Dressing Wound #1 Right,Lateral Lower Leg o Iodoflex Secondary Dressing Wound #1 Right,Lateral Lower Leg o Boardered Foam Dressing Dressing Change Frequency Wound #1 Right,Lateral Lower Leg o Change dressing every other day. Follow-up Appointments Wound #1 Right,Lateral Lower Leg o Return Appointment in 1 week. Additional Orders / Instructions Wound #1 Right,Lateral Lower Leg [name], [name] o Increase protein intake. o Activity  as tolerated Laboratory o Bacteria identified in Wound by Culture (MICRO) - Right lower leg oooo LOINC Code: [id number] oooo Convenience Name: Wound culture routine Electronic Signature(s) Signed: 02/06/2016 1:23:33 PM By: Regan Lemming BSN, RN Signed: 02/06/2016 4:35:27 PM By: [name] Ham MD Entered By: Regan Lemming on  02/06/2016 13:23:33 Brenning, Cadience L. ([id number]) -------------------------------------------------------------------------------- Problem List Details Patient Name: [name], [name] Date of Service: 02/06/2016 12:45 PM Medical Record Patient Account Number: [id number] Number: Treating RN: Baruch Gouty, RN, BSN, [name] [birth year] [phone] y.o. 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 ([id number]) -------------------------------------------------------------------------------- Progress Note Details Patient Name: [name], [name] Date of Service: 02/06/2016 12:45 PM Medical Record Patient Account Number: [id number] Number: Treating RN: Baruch Gouty, RN, BSN, [name] [birth year] [phone] y.o. Other Clinician: Date of Birth/Sex: Female) Treating Jt Brabec Primary Care Physician/Extender: [name] [name] Physician: Referring Physician: Holland [name] in Treatment: 8 Subjective Chief Complaint Information obtained from Patient Patient is here for a chronic wound on her right anterior lateral leg History of Present  Illness (HPI) 12/06/15; this is a patient who has no prior wound history and is not a diabetic. In February she was getting into her family truck and the wind pushed the door against the outer aspect of her right lower leg. I believe there was initially some swelling but not clearly a hematoma. 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 Has been using topical antibiotics on this. 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 There is already been some improvement. She has no prior history of PAD and no major history of venous insufficiency. 12/13/15; wound appears healthy, using prisma 12/20/15; patient arrives complaining of increasing pain around the wound. She also had a blister under the wound. 12/26/15; the patient arrives with the blister last week fully excised and healed however she has another floppy flaccid blister just below her wound. The wound does not appear to be infected. The cause of this is not really clear, he does walk 3 miles a day but doesn't think that the area with a blister is currently rubs the bandage at all. She said she felt a stinging and then noticed this on the weekend. She will complete the antibiotics I gave hertomorrow 01/02/16; the patient's original wound in the right lower leg requires debridement of surface slough and surrounding eschar. The base of this then looks healthy. The area that was a blister underneath this of uncertain etiology also appears to be better. Culture of this blister from last week was negative 01/09/16; the patient's original wound on the lower right lateral calf requires debridement of surface slough and surrounding eschar. Base of this looks healthy once again. She has another recurrent blister underneath this wound. This is his second one of these. I cultured the last one which was negative. The patient is very active and I wonder whether this has something to do with this there is no evidence  of surrounding cellulitis 01/16/16; the patient's original wound on the lower right calf again requires debridement. Her subsequent wound below this which was a blister last week also has a fibrinous surface slough that requires debridement. Again the etiology of these wounds isn't completely clear to me. She does not have PAD, her ABI in this clinic was 1.17 on the right. She may have mild venous reflux but certainly does not have  frank venous inflammation. She is very active telling me she walked 5 miles this weekend but states that she did not have anything that showed of caused pressure friction on the wound area. 01/23/16 both the patient's wounds appear to be improved. Her subsequent wound which was a blister also appears to be smaller. [name], [name] ([id number]) 01/30/16; the inferior wound which was a secondary wound has resolved. Her original wound appears better. Still using Iodoflex 02/06/16; surprisingly the patient's wound is almost totally closed over except for a small probing, I'll on the medial aspect of the circumference of the wound. The issue here is she has never had tunneling in this, most of this was always superficial. This has a clear, nonpurulent, looking drainage Objective Constitutional Sitting or standing Blood Pressure is within target range for patient.. Patient is hypertensive.. Pulse regular and within target range for patient.Marland Kitchen Respirations regular, non-labored and within target range.. Temperature is normal and within the target range for the patient.. Vitals Time Taken: 12:52 PM, Height: 65 in, Weight: 134 lbs, BMI: 22.3, Temperature: 97.6 F, Pulse: 72 bpm, Respiratory Rate: 16 breaths/min, Blood Pressure: 123/88 mmHg. Eyes Conjunctivae clear. No discharge.Marland Kitchen Respiratory Respiratory effort is easy and symmetric bilaterally. Rate is normal at rest and on room air.. Cardiovascular Pedal pulses palpable and strong bilaterally.. Extremities are free of  varicosities, clubbing or edema. Peripheral pulses strong and equal. Capillary refill < 3 seconds.. Lymphatic No palpable lymphadenopathy in the popliteal or inguinal areas. General Notes: Wound exam; the area on the right medial leg is fully epithelialized except for a small tunnel going down 0.5 cm on the medial aspect of her original wound. With careful palpation there is clear drainage, this does not look purulent. There is no evidence of soft tissue infection. Nevertheless I've gone ahead and cultured this Integumentary (Hair, Skin) Wound #1 status is Open. Original cause of wound was Trauma. The wound is located on the Right,Lateral Lower Leg. The wound measures 0.2cm length x 0.1cm width x 0.4cm depth; 0.016cm^2 area and 0.006cm^3 volume. The wound is limited to skin breakdown. There is no tunneling or undermining noted. There is a medium amount of serosanguineous drainage noted. The wound margin is distinct with the outline attached to the wound base. There is large (67-100%) pink, pale granulation within the wound bed. There is no necrotic tissue within the wound bed. The periwound skin appearance exhibited: Localized Edema, Moist. The periwound skin appearance did not exhibit: Callus, Crepitus, Excoriation, Fluctuance, Friable, Induration, Rash, Scarring, Dry/Scaly, Maceration, Atrophie Blanche, Cyanosis, Ecchymosis, Dunlap, Sweetie L. ([id number]) Hemosiderin Staining, Mottled, Pallor, Rubor, Erythema. Periwound temperature was noted as No Abnormality. The periwound has tenderness on palpation. 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 May shower with protection. Anesthetic: Wound #1 Right,Lateral Lower  Leg: Topical Lidocaine 4% cream applied to wound bed prior to debridement Skin Barriers/Peri-Wound Care: Wound #1 Right,Lateral Lower Leg: Barrier cream Primary Wound Dressing: Wound #1 Right,Lateral Lower Leg: Iodoflex Secondary Dressing: Wound #1 Right,Lateral Lower Leg: Boardered Foam Dressing Dressing Change Frequency: Wound #1 Right,Lateral Lower Leg: Change dressing every other day. Follow-up Appointments: Wound #1 Right,Lateral Lower Leg: Return Appointment in 1 week. Additional Orders / Instructions: Wound #1 Right,Lateral Lower Leg: Increase protein intake. Activity as tolerated Laboratory ordered  were: Wound culture routine - Right lower leg [name], [name] A culture of the clear drainage from the small probing area was obtained, no empiric antibiotics Substituted Iodosorb ointment for Iodoflex I don't have a clear explanation for this presentation, This never looked like an exit site wound Electronic Signature(s) Signed: 02/06/2016 4:35:27 PM By: [name] Ham MD Entered By: [name] [name] on 02/06/2016 13:34:54 [name], [name] -------------------------------------------------------------------------------- SuperBill Details Patient Name: Mill, Jaretzy L. Date of Service: 02/06/2016 Medical Record Patient Account Number: [id number] Number: Treating RN: Baruch Gouty, RN, BSN, [name] [birth year] [phone] y.o. Other Clinician: Date of Birth/Sex: Female) Treating Gola Bribiesca Primary Care Physician/Extender: [name] [name] Physician: Suella Grove in Treatment: 8 Referring Physician: Margarita Rana Diagnosis Coding ICD-10 Codes Code Description [phone] Non-pressure chronic ulcer of right calf with necrosis of muscle I87.2 Venous insufficiency (chronic) (peripheral) Facility Procedures CPT4 Code: [id number] Description: [id number] - WOUND CARE VISIT-LEV 3 EST PT Modifier: Quantity: 1 Physician Procedures CPT4 Code Description: [id number] - WC  PHYS LEVEL 3 - EST PT ICD-10 Description Diagnosis L97.213 Non-pressure chronic ulcer of right calf with necro Modifier: sis of muscle Quantity: 1 Electronic Signature(s) Signed: 02/06/2016 4:27:51 PM By: Regan Lemming BSN, RN Signed: 02/06/2016 4:35:27 PM By: [name] Ham MD Entered By: Regan Lemming on 02/06/2016 16:27:51

## 2016-02-09 LAB — AEROBIC CULTURE  (SUPERFICIAL SPECIMEN)

## 2016-02-09 LAB — AEROBIC CULTURE W GRAM STAIN (SUPERFICIAL SPECIMEN)
Culture: NO GROWTH
Gram Stain: NONE SEEN

## 2016-02-13 ENCOUNTER — Encounter (HOSPITAL_BASED_OUTPATIENT_CLINIC_OR_DEPARTMENT_OTHER): Payer: PPO | Admitting: General Surgery

## 2016-02-13 DIAGNOSIS — S81811D Laceration without foreign body, right lower leg, subsequent encounter: Secondary | ICD-10-CM | POA: Diagnosis not present

## 2016-02-13 DIAGNOSIS — S81811A Laceration without foreign body, right lower leg, initial encounter: Secondary | ICD-10-CM | POA: Insufficient documentation

## 2016-02-13 DIAGNOSIS — L97213 Non-pressure chronic ulcer of right calf with necrosis of muscle: Secondary | ICD-10-CM | POA: Diagnosis not present

## 2016-02-13 NOTE — Progress Notes (Signed)
[name], [name] ([id number]) Visit Report for 02/13/2016 Arrival Information Details Patient Name: [name] [name], [name] Date of Service: 02/13/2016 1:30 PM Medical Record Number: [id number] Patient Account Number: [id number] Date of Birth/Sex: Feb 09, 1941 (75 y.o. Female) Treating RN: Afful, RN, BSN, Velva Harman Primary Care Physician: Margarita Rana Other Clinician: Referring Physician: Margarita Rana Treating Physician/Extender: Benjaman Pott in Treatment: 9 Visit Information History Since Last Visit Added or deleted any medications: No Patient Arrived: Ambulatory Any new allergies or adverse reactions: No Arrival Time: 13:50 Had a fall or experienced change in No Accompanied By: self activities of daily living that may affect Transfer Assistance: None risk of falls: Patient Identification Verified: Yes Signs or symptoms of abuse/neglect since last No Secondary Verification Process Yes visito Completed: Hospitalized since last visit: No Patient Requires Transmission-Based No Has Dressing in Place as Prescribed: Yes Precautions: Pain Present Now: No Patient Has Alerts: No Electronic Signature(s) Signed: 02/13/2016 4:13:49 PM By: Regan Lemming BSN, RN Entered By: Regan Lemming on 02/13/2016 13:51:05 Hendricks, Gaige L. ([id number]) -------------------------------------------------------------------------------- Clinic Level of Care Assessment Details Patient Name: Foister, [name] Date of Service: 02/13/2016 1:30 PM Medical Record Number: [id number] Patient Account Number: [id number] Date of Birth/Sex: 09-19-1940 (75 y.o. Female) Treating RN: Afful, RN, BSN, [place] Primary Care Physician: Margarita Rana Other Clinician: Referring Physician: Margarita Rana Treating Physician/Extender: Benjaman Pott in Treatment: 9 Clinic Level of Care Assessment Items TOOL 4 Quantity Score []  - Use when only an EandM is performed on FOLLOW-UP visit 0 ASSESSMENTS - Nursing Assessment / Reassessment X -  Reassessment of Co-morbidities (includes updates in patient status) 1 10 X - Reassessment of Adherence to Treatment Plan 1 5 ASSESSMENTS - Wound and Skin Assessment / Reassessment []  - Simple Wound Assessment / Reassessment - one wound 0 []  - Complex Wound Assessment / Reassessment - multiple wounds 0 []  - Dermatologic / Skin Assessment (not related to wound area) 0 ASSESSMENTS - Focused Assessment []  - Circumferential Edema Measurements - multi extremities 0 []  - Nutritional Assessment / Counseling / Intervention 0 X - Lower Extremity Assessment (monofilament, tuning fork, pulses) 1 5 []  - Peripheral Arterial Disease Assessment (using hand held doppler) 0 ASSESSMENTS - Ostomy and/or Continence Assessment and Care []  - Incontinence Assessment and Management 0 []  - Ostomy Care Assessment and Management (repouching, etc.) 0 PROCESS - Coordination of Care X - Simple Patient / Family Education for ongoing care 1 15 []  - Complex (extensive) Patient / Family Education for ongoing care 0 []  - Staff obtains Programmer, systems, Records, Test Results / Process Orders 0 []  - Staff telephones HHA, Nursing Homes / Clarify orders / etc 0 []  - Routine Transfer to another Facility (non-emergent condition) 0 Ghattas, Xiadani L. ([id number]) []  - Routine Hospital Admission (non-emergent condition) 0 []  - New Admissions / Biomedical engineer / Ordering NPWT, Apligraf, etc. 0 []  - Emergency Hospital Admission (emergent condition) 0 []  - Simple Discharge Coordination 0 []  - Complex (extensive) Discharge Coordination 0 PROCESS - Special Needs []  - Pediatric / Minor Patient Management 0 []  - Isolation Patient Management 0 []  - Hearing / Language / Visual special needs 0 []  - Assessment of Community assistance (transportation, D/C planning, etc.) 0 []  - Additional assistance / Altered mentation 0 []  - Support Surface(s) Assessment (bed, cushion, seat, etc.) 0 INTERVENTIONS - Wound Cleansing / Measurement X - Simple  Wound Cleansing - one wound 1 5 []  - Complex Wound Cleansing - multiple wounds 0 X - Wound Imaging (photographs - any number of wounds)  1 5 []  - Wound Tracing (instead of photographs) 0 X - Simple Wound Measurement - one wound 1 5 []  - Complex Wound Measurement - multiple wounds 0 INTERVENTIONS - Wound Dressings X - Small Wound Dressing one or multiple wounds 1 10 []  - Medium Wound Dressing one or multiple wounds 0 []  - Large Wound Dressing one or multiple wounds 0 []  - Application of Medications - topical 0 []  - Application of Medications - injection 0 INTERVENTIONS - Miscellaneous []  - External ear exam 0 Leifheit, Camrin L. ([id number]) []  - Specimen Collection (cultures, biopsies, blood, body fluids, etc.) 0 []  - Specimen(s) / Culture(s) sent or taken to Lab for analysis 0 []  - Patient Transfer (multiple staff / Harrel Lemon Lift / Similar devices) 0 []  - Simple Staple / Suture removal (25 or less) 0 []  - Complex Staple / Suture removal (26 or more) 0 []  - Hypo / Hyperglycemic Management (close monitor of Blood Glucose) 0 []  - Ankle / Brachial Index (ABI) - do not check if billed separately 0 X - Vital Signs 1 5 Has the patient been seen at the hospital within the last three years: Yes Total Score: 65 Level Of Care: New/Established - Level 2 Electronic Signature(s) Signed: 02/13/2016 4:13:49 PM By: Regan Lemming BSN, RN Entered By: Regan Lemming on 02/13/2016 14:21:09 Remus, Kariana L. ([id number]) -------------------------------------------------------------------------------- Encounter Discharge Information Details Patient Name: Corado, [name] Date of Service: 02/13/2016 1:30 PM Medical Record Number: [id number] Patient Account Number: [id number] Date of Birth/Sex: February 02, 1941 (75 y.o. Female) Treating RN: Baruch Gouty, RN, BSN, Velva Harman Primary Care Physician: Margarita Rana Other Clinician: Referring Physician: Margarita Rana Treating Physician/Extender: Benjaman Pott in Treatment: 9 Encounter  Discharge Information Items Discharge Pain Level: 0 Discharge Condition: Stable Ambulatory Status: Ambulatory Discharge Destination: Home Transportation: Private Auto Accompanied By: self Schedule Follow-up Appointment: No Medication Reconciliation completed and provided to Patient/Care No Jereld Presti: Provided on Clinical Summary of Care: 02/13/2016 Form Type Recipient Paper Patient NB Electronic Signature(s) Signed: 02/13/2016 2:48:20 PM By: Judene Companion MD Previous Signature: 02/13/2016 2:20:07 PM Version By: Ruthine Dose Entered By: Judene Companion on 02/13/2016 14:48:19 Henandez, Wren. ([id number]) -------------------------------------------------------------------------------- Lower Extremity Assessment Details Patient Name: Agard, Marice L. Date of Service: 02/13/2016 1:30 PM Medical Record Number: [id number] Patient Account Number: [id number] Date of Birth/Sex: [birth year] (75 y.o. Female) Treating RN: Afful, RN, BSN, Velva Harman Primary Care Physician: Margarita Rana Other Clinician: Referring Physician: Margarita Rana Treating Physician/Extender: Benjaman Pott in Treatment: 9 Vascular Assessment Pulses: Posterior Tibial Dorsalis Pedis Palpable: [Right:Yes] Extremity colors, hair growth, and conditions: Extremity Color: [Right:Normal] Hair Growth on Extremity: [Right:No] Temperature of Extremity: [Right:Warm] Capillary Refill: [Right:< 3 seconds] Toe Nail Assessment Left: Right: Thick: Yes Discolored: Yes Deformed: No Improper Length and Hygiene: No Electronic Signature(s) Signed: 02/13/2016 4:13:49 PM By: Regan Lemming BSN, RN Entered By: Regan Lemming on 02/13/2016 13:51:38 Daughdrill, Tylynn L. ([id number]) -------------------------------------------------------------------------------- Multi Wound Chart Details Patient Name: Mcclees, Kynleigh L. Date of Service: 02/13/2016 1:30 PM Medical Record Number: [id number] Patient Account Number: [id number] Date of Birth/Sex: 05-Sep-1940  (75 y.o. Female) Treating RN: Baruch Gouty, RN, BSN, Velva Harman Primary Care Physician: Margarita Rana Other Clinician: Referring Physician: Margarita Rana Treating Physician/Extender: Benjaman Pott in Treatment: 9 Vital Signs Height(in): 65 Pulse(bpm): 72 Weight(lbs): 134 Blood Pressure 118/58 (mmHg): Body Mass Index(BMI): 22 Temperature(F): 97.4 Respiratory Rate 16 (breaths/min): Photos: [1:No Photos] [N/A:N/A] Wound Location: [1:Right Lower Leg - Lateral] [N/A:N/A] Wounding Event: [1:Trauma] [N/A:N/A] Primary Etiology: [1:Skin Tear] [N/A:N/A] Comorbid History: [1:Cataracts, Chronic Obstructive Pulmonary Disease (  COPD), Hypertension, Osteoarthritis, Neuropathy, Confinement Anxiety] [N/A:N/A] Date Acquired: [1:10/11/2015] [N/A:N/A] Weeks of Treatment: [1:9] [N/A:N/A] Wound Status: [1:Open] [N/A:N/A] Measurements L x W x D 0.1x0.1x0.4 [N/A:N/A] (cm) Area (cm) : [1:0.008] [N/A:N/A] Volume (cm) : [1:0.003] [N/A:N/A] % Reduction in Area: [1:99.70%] [N/A:N/A] % Reduction in Volume: 99.00% [N/A:N/A] Classification: [1:Full Thickness Without Exposed Support Structures] [N/A:N/A] Exudate Amount: [1:Medium] [N/A:N/A] Exudate Type: [1:Serosanguineous] [N/A:N/A] Exudate Color: [1:red, brown] [N/A:N/A] Wound Margin: [1:Distinct, outline attached] [N/A:N/A] Granulation Amount: [1:Large (67-100%)] [N/A:N/A] Granulation Quality: [1:Pink, Pale] [N/A:N/A] Necrotic Amount: [1:None Present (0%)] [N/A:N/A] Exposed Structures: Fascia: No N/A N/A Fat: No Tendon: No Muscle: No Joint: No Bone: No Limited to Skin Breakdown Epithelialization: Large (67-100%) N/A N/A Periwound Skin Texture: Edema: Yes N/A N/A Excoriation: No Induration: No Callus: No Crepitus: No Fluctuance: No Friable: No Rash: No Scarring: No Periwound Skin Moist: Yes N/A N/A Moisture: Maceration: No Dry/Scaly: No Periwound Skin Color: Atrophie Blanche: No N/A N/A Cyanosis: No Ecchymosis: No Erythema:  No Hemosiderin Staining: No Mottled: No Pallor: No Rubor: No Temperature: No Abnormality N/A N/A Tenderness on Yes N/A N/A Palpation: Wound Preparation: Ulcer Cleansing: N/A N/A Rinsed/Irrigated with Saline Topical Anesthetic Applied: Other: lidocaine 4% Treatment Notes Electronic Signature(s) Signed: 02/13/2016 4:13:49 PM By: Regan Lemming BSN, RN Entered By: Regan Lemming on 02/13/2016 14:20:01 Kondracki, Marivel LMarland Kitchen ([id number]) -------------------------------------------------------------------------------- [place] Details Patient Name: Dibella, Chryl L. Date of Service: 02/13/2016 1:30 PM Medical Record Number: [id number] Patient Account Number: [id number] Date of Birth/Sex: [birth year] (75 y.o. Female) Treating RN: Afful, RN, BSN, Velva Harman Primary Care Physician: Margarita Rana Other Clinician: Referring Physician: Margarita Rana Treating Physician/Extender: Benjaman Pott in Treatment: 9 Active Inactive Orientation to the Wound Care Program Nursing Diagnoses: Knowledge deficit related to the wound healing center program Goals: Patient/caregiver will verbalize understanding of the [place] Program Date Initiated: 12/06/2015 Goal Status: Active Interventions: Provide education on orientation to the wound center Notes: Venous Leg Ulcer Nursing Diagnoses: Knowledge deficit related to disease process and management Potential for venous Insuffiency (use before diagnosis confirmed) Goals: Non-invasive venous studies are completed as ordered Date Initiated: 12/06/2015 Goal Status: Active Patient will maintain optimal edema control Date Initiated: 12/06/2015 Goal Status: Active Patient/caregiver will verbalize understanding of disease process and disease management Date Initiated: 12/06/2015 Goal Status: Active Verify adequate tissue perfusion prior to therapeutic compression application Date Initiated: 12/06/2015 Goal Status:  Active Interventions: Assess peripheral edema status every visit. Langill, Haya L. ([id number]) Compression as ordered Provide education on venous insufficiency Treatment Activities: Non-invasive vascular studies : 12/13/2015 Therapeutic compression applied : 12/13/2015 Notes: Wound/Skin Impairment Nursing Diagnoses: Impaired tissue integrity Knowledge deficit related to smoking impact on wound healing Knowledge deficit related to ulceration/compromised skin integrity Goals: Patient/caregiver will verbalize understanding of skin care regimen Date Initiated: 12/06/2015 Goal Status: Active Ulcer/skin breakdown will have a volume reduction of 30% by week 4 Date Initiated: 12/06/2015 Goal Status: Active Ulcer/skin breakdown will have a volume reduction of 50% by week 8 Date Initiated: 12/06/2015 Goal Status: Active Ulcer/skin breakdown will have a volume reduction of 80% by week 12 Date Initiated: 12/06/2015 Goal Status: Active Ulcer/skin breakdown will heal within 14 weeks Date Initiated: 12/06/2015 Goal Status: Active Interventions: Assess patient/caregiver ability to perform ulcer/skin care regimen upon admission and as needed Assess ulceration(s) every visit Provide education on ulcer and skin care Treatment Activities: Referred to DME Clarke Peretz for dressing supplies : 12/13/2015 Skin care regimen initiated : 12/13/2015 Topical wound management initiated : 12/13/2015 Notes: Eblin, Unika L. ([id number]) Electronic Signature(s)  Signed: 02/13/2016 4:13:49 PM By: Regan Lemming BSN, RN Entered By: Regan Lemming on 02/13/2016 14:19:50 Durfee, Staley L. ([id number]) -------------------------------------------------------------------------------- Pain Assessment Details Patient Name: Falero, Arrietty L. Date of Service: 02/13/2016 1:30 PM Medical Record Number: [id number] Patient Account Number: [id number] Date of Birth/Sex: [birth year] (75 y.o. Female) Treating RN: Baruch Gouty, RN, BSN, Velva Harman Primary Care  Physician: Margarita Rana Other Clinician: Referring Physician: Margarita Rana Treating Physician/Extender: Benjaman Pott in Treatment: 9 Active Problems Location of Pain Severity and Description of Pain Patient Has Paino No Site Locations With Dressing Change: No Pain Management and Medication Current Pain Management: Electronic Signature(s) Signed: 02/13/2016 4:13:49 PM By: Regan Lemming BSN, RN Entered By: Regan Lemming on 02/13/2016 13:51:13 Balow, Jameelah L. ([id number]) -------------------------------------------------------------------------------- Patient/Caregiver Education Details Patient Name: Radford, Kinberly L. Date of Service: 02/13/2016 1:30 PM Medical Record Number: [id number] Patient Account Number: [id number] Date of Birth/Gender: [birth year] (75 y.o. Female) Treating RN: Baruch Gouty, RN, BSN, Velva Harman Primary Care Physician: Margarita Rana Other Clinician: Referring Physician: Margarita Rana Treating Physician/Extender: Benjaman Pott in Treatment: 9 Education Assessment Education Provided To: Patient Education Topics Provided Venous: Methods: Explain/Verbal Responses: State content correctly Welcome To [facility] [place]: Methods: Explain/Verbal Responses: State content correctly Wound/Skin Impairment: Methods: Explain/Verbal Responses: State content correctly Electronic Signature(s) Signed: 02/13/2016 2:48:27 PM By: Judene Companion MD Entered By: Judene Companion on 02/13/2016 14:48:27 Batchelder, Arra L. ([id number]) -------------------------------------------------------------------------------- Wound Assessment Details Patient Name: Kroner, Andie L. Date of Service: 02/13/2016 1:30 PM Medical Record Number: [id number] Patient Account Number: [id number] Date of Birth/Sex: 1941-06-09 (75 y.o. Female) Treating RN: Afful, RN, BSN, [place] Primary Care Physician: Margarita Rana Other Clinician: Referring Physician: Margarita Rana Treating Physician/Extender: Benjaman Pott in Treatment: 9 Wound Status Wound Number: 1 Primary Skin Tear Etiology: Wound Location: Right Lower Leg - Lateral Wound Open Wounding Event: Trauma Status: Date Acquired: 10/11/2015 Comorbid Cataracts, Chronic Obstructive Weeks Of Treatment: 9 History: Pulmonary Disease (COPD), Clustered Wound: No Hypertension, Osteoarthritis, Neuropathy, Confinement Anxiety Photos Photo Uploaded By: Regan Lemming on 02/13/2016 16:11:47 Wound Measurements Length: (cm) 0.1 Width: (cm) 0.1 Depth: (cm) 0.4 Area: (cm) 0.008 Volume: (cm) 0.003 % Reduction in Area: 99.7% % Reduction in Volume: 99% Epithelialization: Large (67-100%) Tunneling: No Undermining: No Wound Description Full Thickness Without Exposed Classification: Support Structures Wound Margin: Distinct, outline attached Exudate Medium Amount: Exudate Type: Serosanguineous Exudate Color: red, brown Foul Odor After Cleansing: No Wound Bed Granulation Amount: Large (67-100%) Exposed Structure Harvie, Amal L. ([id number]) Granulation Quality: Pink, Pale Fascia Exposed: No Necrotic Amount: None Present (0%) Fat Layer Exposed: No Tendon Exposed: No Muscle Exposed: No Joint Exposed: No Bone Exposed: No Limited to Skin Breakdown Periwound Skin Texture Texture Color No Abnormalities Noted: No No Abnormalities Noted: No Callus: No Atrophie Blanche: No Crepitus: No Cyanosis: No Excoriation: No Ecchymosis: No Fluctuance: No Erythema: No Friable: No Hemosiderin Staining: No Induration: No Mottled: No Localized Edema: Yes Pallor: No Rash: No Rubor: No Scarring: No Temperature / Pain Moisture Temperature: No Abnormality No Abnormalities Noted: No Tenderness on Palpation: Yes Dry / Scaly: No Maceration: No Moist: Yes Wound Preparation Ulcer Cleansing: Rinsed/Irrigated with Saline Topical Anesthetic Applied: Other: lidocaine 4%, Treatment Notes Wound #1 (Right, Lateral Lower Leg) 1. Cleansed  with: Clean wound with Normal Saline 4. Dressing Applied: Iodosorb Ointment 5. Secondary [place] Signature(s) Signed: 02/13/2016 4:13:49 PM By: Regan Lemming BSN, RN Entered By: Regan Lemming on 02/13/2016 13:58:50 Dohrman, Hazel L. ([id number]) -------------------------------------------------------------------------------- Vitals Details Patient Name: Ruggiero, Sharlette L. Date of  Service: 02/13/2016 1:30 PM Medical Record Number: [id number] Patient Account Number: [id number] Date of Birth/Sex: 22-Dec-1940 (75 y.o. Female) Treating RN: Afful, RN, BSN, [employer] Primary Care Physician: Margarita Rana Other Clinician: Referring Physician: Margarita Rana Treating Physician/Extender: Benjaman Pott in Treatment: 9 Vital Signs Time Taken: 13:53 Temperature (F): 97.4 Height (in): 65 Pulse (bpm): 72 Weight (lbs): 134 Respiratory Rate (breaths/min): 16 Body Mass Index (BMI): 22.3 Blood Pressure (mmHg): 118/58 Reference Range: 80 - 120 mg / dl Electronic Signature(s) Signed: 02/13/2016 4:13:49 PM By: Regan Lemming BSN, RN Entered By: Regan Lemming on 02/13/2016 13:54:04

## 2016-02-13 NOTE — Progress Notes (Signed)
Wound healing well with coolagen

## 2016-02-13 NOTE — Progress Notes (Addendum)
[name] [name], [name] Visit Report for 02/13/2016 Chief Complaint Document Details Patient Name: [name] [name], [name] Date of Service: 02/13/2016 1:30 PM Medical Record Patient Account Number: [id number] Number: Afful, RN, BSN, Treating RN: May 01, 1941 (75 y.o. [name] [name] Date of Birth/Sex: Female) Other Clinician: Primary Care Physician: [name] [name], [name] [name] Referring Physician: Margarita [name] Physician/Extender: [name] [name] in Treatment: 9 Information Obtained from: Patient Chief Complaint Patient is here for a chronic wound on her right anterior lateral leg Electronic Signature(s) Signed: 02/13/2016 2:46:03 PM By: [name] Companion MD Previous Signature: 02/13/2016 2:08:42 PM Version By: [name] Companion MD Entered By: [name] [name] on 02/13/2016 14:46:03 Buckner, [place] L. ([id number]) -------------------------------------------------------------------------------- HPI Details Patient Name: [name], [name] Date of Service: 02/13/2016 1:30 PM Medical Record Patient Account Number: [id number] Number: Afful, RN, BSN, Treating RN: [birth year] (75 y.o. [name] [name] Date of Birth/Sex: Female) Other Clinician: Primary Care Physician: [name] [name], [place] Referring Physician: Margarita [name] Physician/Extender: [name] [name] in Treatment: 9 History of Present Illness HPI Description: 12/06/15; this is a patient who has no prior wound history and is not a diabetic. In February she was getting into her family truck and the wind pushed the door against the outer aspect of her right lower leg. I believe there was initially some swelling but not clearly a hematoma. 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 Has been using topical antibiotics on this. 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 There is already been some improvement. She has no prior history of PAD and no major history of venous insufficiency. 12/13/15; wound appears healthy,  using prisma 12/20/15; patient arrives complaining of increasing pain around the wound. She also had a blister under the wound. 12/26/15; the patient arrives with the blister last week fully excised and healed however she has another floppy flaccid blister just below her wound. The wound does not appear to be infected. The cause of this is not really clear, he does walk 3 miles a day but doesn't think that the area with a blister is currently rubs the bandage at all. She said she felt a stinging and then noticed this on the weekend. She will complete the antibiotics I gave hertomorrow 01/02/16; the patient's original wound in the right lower leg requires debridement of surface slough and surrounding eschar. The base of this then looks healthy. The area that was a blister underneath this of uncertain etiology also appears to be better. Culture of this blister from last week was negative 01/09/16; the patient's original wound on the lower right lateral calf requires debridement of surface slough and surrounding eschar. Base of this looks healthy once again. She has another recurrent blister underneath this wound. This is his second one of these. I cultured the last one which was negative. The patient is very active and I wonder whether this has something to do with this there is no evidence of surrounding cellulitis 01/16/16; the patient's original wound on the lower right calf again requires debridement. Her subsequent wound below this which was a blister last week also has a fibrinous surface slough that requires debridement. Again the etiology of these wounds isn't completely clear to me. She does not have PAD, her ABI in this clinic was 1.17 on the right. She may have mild venous reflux but certainly does not have frank venous inflammation. She is very active telling me she walked 5 miles this weekend but states that she did not have anything that showed  of caused pressure friction on the wound  area. 01/23/16 both the patient's wounds appear to be improved. Her subsequent wound which was a blister also appears to be smaller. 01/30/16; the inferior wound which was a secondary wound has resolved. Her original wound appears better. Still using Iodoflex 02/06/16; surprisingly the patient's wound is almost totally closed over except for a small probing, I'll on the medial aspect of the circumference of the wound. The issue here is she has never had tunneling in this, most of this was always superficial. This has a clear, nonpurulent, looking drainage [name], [name] ([id number]) Electronic Signature(s) Signed: 02/13/2016 2:46:19 PM By: [name] Companion MD Previous Signature: 02/13/2016 2:34:31 PM Version By: [name] Companion MD Previous Signature: 02/13/2016 2:09:14 PM Version By: [name] Companion MD Entered By: [name] [name] on 02/13/2016 14:46:19 [name], [name] ([id number]) -------------------------------------------------------------------------------- Physical Exam Details Patient Name: [name], [name] Date of Service: 02/13/2016 1:30 PM Medical Record Patient Account Number: [id number] Number: Afful, RN, BSN, Treating RN: 09/10/1940 (75 y.o. [name] [name] Date of Birth/Sex: Female) Other Clinician: Primary Care Physician: [name] [name], [place] Referring Physician: Margarita [name] Physician/Extender: [name] [name] in Treatment: 9 Electronic Signature(s) Signed: 02/13/2016 2:46:27 PM By: [name] Companion MD Entered By: [name] [name] on 02/13/2016 14:46:27 [name] -------------------------------------------------------------------------------- Physician Orders Details Patient Name: [name], [name] Date of Service: 02/13/2016 1:30 PM Medical Record Patient Account Number: [id number] Number: Afful, RN, BSN, Treating RN: 1941/08/15 (75 y.o. 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 o May shower with protection. Anesthetic Wound #1 Right,Lateral Lower Leg o Topical Lidocaine 4% cream applied to wound bed prior to debridement Skin Barriers/Peri-Wound Care Wound #1 Right,Lateral Lower Leg o Barrier cream Primary Wound Dressing Wound #1 Right,Lateral Lower Leg o Iodoflex Secondary Dressing Wound #1 Right,Lateral Lower Leg o Boardered Foam Dressing Dressing Change Frequency Wound #1 Right,Lateral Lower Leg o Change dressing every other day. Follow-up Appointments Wound #1 Right,Lateral Lower Leg Nabor, Jene L. ([id number]) o Return Appointment in 1 week. Additional Orders / Instructions Wound #1 Right,Lateral Lower Leg o Increase protein intake. o Activity as tolerated Electronic Signature(s) Signed: 02/13/2016 3:04:31 PM By: [name] Companion MD Signed: 02/13/2016 4:13:49 PM By: Regan Lemming BSN, RN Entered By: Regan Lemming on 02/13/2016 14:19:38 Mathenia, Lloyd L. ([id number]) -------------------------------------------------------------------------------- Problem List Details Patient Name: Glasby, Kiante L. Date of Service: 02/13/2016 1:30 PM Medical Record Patient Account Number: [id number] Number: Afful, RN, BSN, Treating RN: May 12, 1941 (75 y.o. 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 ([id number]) -------------------------------------------------------------------------------- Progress Note Details Patient Name: Bogue,  Skylin L. Date of Service: 02/13/2016 1:30 PM Medical Record Patient Account Number: [id number] Number: Afful, RN, BSN, Treating RN: Oct 12, 1940 (75 y.o. [name] [name] Date of Birth/Sex: Female) Other Clinician: Primary Care Physician: [name] [name], [place] Referring Physician: Margarita [name] Physician/Extender: [name] [name] in Treatment: 9 Subjective Chief Complaint Information obtained from Patient Patient is here for a chronic wound on her right anterior lateral leg History of Present Illness (HPI) 12/06/15; this is a patient who has no prior wound history and is not a diabetic. In February she was getting into her family truck and the wind pushed the door against the outer aspect of her right lower leg. I believe there was initially some swelling but not clearly a hematoma. 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 Has been using topical antibiotics on this. 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 There is already been some improvement. She has no prior history of PAD and no major history of venous insufficiency. 12/13/15; wound appears healthy, using prisma 12/20/15; patient arrives complaining of increasing pain around the wound. She also had a blister under the wound. 12/26/15; the patient arrives with the blister last week fully  excised and healed however she has another floppy flaccid blister just below her wound. The wound does not appear to be infected. The cause of this is not really clear, he does walk 3 miles a day but doesn't think that the area with a blister is currently rubs the bandage at all. She said she felt a stinging and then noticed this on the weekend. She will complete the antibiotics I gave hertomorrow 01/02/16; the patient's original wound in the right lower leg requires debridement of surface slough and surrounding eschar. The base of this then looks healthy. The area that was a blister underneath this of uncertain etiology also appears to be better. Culture of this blister from last week was negative 01/09/16; the patient's original wound on the lower right lateral calf requires debridement of surface slough and surrounding eschar. Base of this looks healthy once again. She has another recurrent blister underneath this wound. This is his second one of these. I cultured the last one which was negative. The patient is very active and I wonder whether this has something to do with this there is no evidence of surrounding cellulitis 01/16/16; the patient's original wound on the lower right calf again requires debridement. Her subsequent wound below this which was a blister last week also has a fibrinous surface slough that requires debridement. Again the etiology of these wounds isn't completely clear to me. She does not have PAD, her ABI in this clinic was 1.17 on the right. She may have mild venous reflux but certainly does not have frank venous inflammation. She is very active telling me she walked 5 miles this weekend but states that she did not have anything that showed of caused pressure friction on the wound area. 01/23/16 both the patient's wounds appear to be improved. Her subsequent wound which was a blister also appears to be smaller. 01/30/16; the inferior wound which was a secondary wound has  resolved. Her original wound appears better. Markie, Namine L. ([id number]) Still using Iodoflex 02/06/16; surprisingly the patient's wound is almost totally closed over except for a small probing, I'll on the medial aspect of the circumference of the wound. The issue here is she has never had tunneling in this, most of this was always superficial. This has a clear,  nonpurulent, looking drainage Objective Constitutional Vitals Time Taken: 1:53 PM, Height: 65 in, Weight: 134 lbs, BMI: 22.3, Temperature: 97.4 F, Pulse: 72 bpm, Respiratory Rate: 16 breaths/min, Blood Pressure: 118/58 mmHg. Integumentary (Hair, Skin) Wound #1 status is Open. Original cause of wound was Trauma. The wound is located on the Right,Lateral Lower Leg. The wound measures 0.1cm length x 0.1cm width x 0.4cm depth; 0.008cm^2 area and 0.003cm^3 volume. The wound is limited to skin breakdown. There is no tunneling or undermining noted. There is a medium amount of serosanguineous drainage noted. The wound margin is distinct with the outline attached to the wound base. There is large (67-100%) pink, pale granulation within the wound bed. There is no necrotic tissue within the wound bed. The periwound skin appearance exhibited: Localized Edema, Moist. The periwound skin appearance did not exhibit: Callus, Crepitus, Excoriation, Fluctuance, Friable, Induration, Rash, Scarring, Dry/Scaly, Maceration, Atrophie Blanche, Cyanosis, Ecchymosis, Hemosiderin Staining, Mottled, Pallor, Rubor, Erythema. Periwound temperature was noted as No Abnormality. The periwound has tenderness on palpation. Small wound almost healed. 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 ([id number]) Wound Cleansing: Wound #1 Right,Lateral Lower Leg: Cleanse wound with mild soap and water May Shower,  gently pat wound dry prior to applying new dressing. May shower with protection. Anesthetic: Wound #1 Right,Lateral Lower Leg: Topical Lidocaine 4% cream applied to wound bed prior to debridement Skin Barriers/Peri-Wound Care: Wound #1 Right,Lateral Lower Leg: Barrier cream Primary Wound Dressing: Wound #1 Right,Lateral Lower Leg: Iodoflex Secondary Dressing: Wound #1 Right,Lateral Lower Leg: Boardered Foam Dressing Dressing Change Frequency: Wound #1 Right,Lateral Lower Leg: Change dressing every other day. Follow-up Appointments: Wound #1 Right,Lateral Lower Leg: Return Appointment in 1 week. Additional Orders / Instructions: Wound #1 Right,Lateral Lower Leg: Increase protein intake. Activity as tolerated Follow-Up Appointments: A Patient Clinical Summary of Care was provided to [facility] Electronic Signature(s) Signed: 02/13/2016 2:47:28 PM By: [name] Companion MD Entered By: [name] [name] on 02/13/2016 14:47:27 Dunn, [place] ([id number]) -------------------------------------------------------------------------------- SuperBill Details Patient Name: Taussig, Avi L. Date of Service: 02/13/2016 Medical Record Patient Account Number: [id number] Number: Afful, RN, BSN, Treating RN: 01-17-1941 (75 y.o. 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

## 2016-02-19 DIAGNOSIS — J42 Unspecified chronic bronchitis: Secondary | ICD-10-CM | POA: Diagnosis not present

## 2016-02-19 DIAGNOSIS — R002 Palpitations: Secondary | ICD-10-CM | POA: Diagnosis not present

## 2016-02-19 DIAGNOSIS — E7800 Pure hypercholesterolemia, unspecified: Secondary | ICD-10-CM | POA: Diagnosis not present

## 2016-02-20 ENCOUNTER — Encounter: Payer: PPO | Admitting: Internal Medicine

## 2016-02-20 DIAGNOSIS — L97213 Non-pressure chronic ulcer of right calf with necrosis of muscle: Secondary | ICD-10-CM | POA: Diagnosis not present

## 2016-02-20 DIAGNOSIS — L2389 Allergic contact dermatitis due to other agents: Secondary | ICD-10-CM | POA: Diagnosis not present

## 2016-02-20 DIAGNOSIS — L299 Pruritus, unspecified: Secondary | ICD-10-CM | POA: Diagnosis not present

## 2016-02-20 DIAGNOSIS — S81801A Unspecified open wound, right lower leg, initial encounter: Secondary | ICD-10-CM | POA: Diagnosis not present

## 2016-02-21 NOTE — Progress Notes (Addendum)
OK, GANO ([id number]) Visit Report for 02/20/2016 Arrival Information Details Patient Name: [name], [name] Date of Service: 02/20/2016 2:15 PM Medical Record Patient Account Number: [id number] Number: Treating RN: Baruch Gouty, RN, BSN, Rita 1940/09/07 [phone] y.o. Other Clinician: Date of Birth/Sex: Female) Treating ROBSON, Arthur Primary Care Physician: Margarita Rana Physician/Extender: G Referring Physician: [name] in Treatment: 10 Visit Information History Since Last Visit Added or deleted any medications: No Patient Arrived: Ambulatory Any new allergies or adverse reactions: No Arrival Time: 14:09 Had a fall or experienced change in No Accompanied By: self activities of daily living that may affect Transfer Assistance: None risk of falls: Patient Identification Verified: Yes Signs or symptoms of abuse/neglect since last No Secondary Verification Process Yes visito Completed: Has Dressing in Place as Prescribed: Yes Patient Requires Transmission-Based No Pain Present Now: No Precautions: Patient Has Alerts: No Electronic Signature(s) Signed: 02/20/2016 3:01:26 PM By: Regan Lemming BSN, RN Entered By: Regan Lemming on 02/20/2016 14:10:21 [name], [name] ([id number]) -------------------------------------------------------------------------------- Clinic Level of Care Assessment Details Patient Name: [name], [name] Date of Service: 02/20/2016 2:15 PM Medical Record Patient Account Number: [id number] Number: Treating RN: Baruch Gouty, RN, BSN, Rita [birth year] [phone] y.o. Other Clinician: Date of Birth/Sex: Female) Treating ROBSON, Luck Primary Care Physician: Margarita Rana Physician/Extender: G Referring Physician: [name] in Treatment: 10 Clinic Level of Care Assessment Items TOOL 4 Quantity Score []  - Use when only an EandM is performed on FOLLOW-UP visit 0 ASSESSMENTS - Nursing Assessment / Reassessment X - Reassessment of  Co-morbidities (includes updates in patient status) 1 10 X - Reassessment of Adherence to Treatment Plan 1 5 ASSESSMENTS - Wound and Skin Assessment / Reassessment []  - Simple Wound Assessment / Reassessment - one wound 0 X - Complex Wound Assessment / Reassessment - multiple wounds 1 5 []  - Dermatologic / Skin Assessment (not related to wound area) 0 ASSESSMENTS - Focused Assessment []  - Circumferential Edema Measurements - multi extremities 0 []  - Nutritional Assessment / Counseling / Intervention 0 X - Lower Extremity Assessment (monofilament, tuning fork, pulses) 1 5 []  - Peripheral Arterial Disease Assessment (using hand held doppler) 0 ASSESSMENTS - Ostomy and/or Continence Assessment and Care []  - Incontinence Assessment and Management 0 []  - Ostomy Care Assessment and Management (repouching, etc.) 0 PROCESS - Coordination of Care X - Simple Patient / Family Education for ongoing care 1 15 []  - Complex (extensive) Patient / Family Education for ongoing care 0 []  - Staff obtains Programmer, systems, Records, Test Results / Process Orders 0 []  - Staff telephones HHA, Nursing Homes / Clarify orders / etc 0 [name], [name] ([id number]) []  - Routine Transfer to another Facility (non-emergent condition) 0 []  - Routine Hospital Admission (non-emergent condition) 0 []  - New Admissions / Biomedical engineer / Ordering NPWT, Apligraf, etc. 0 []  - Emergency Hospital Admission (emergent condition) 0 []  - Simple Discharge Coordination 0 []  - Complex (extensive) Discharge Coordination 0 PROCESS - Special Needs []  - Pediatric / Minor Patient Management 0 []  - Isolation Patient Management 0 []  - Hearing / Language / Visual special needs 0 []  - Assessment of Community assistance (transportation, D/C planning, etc.) 0 []  - Additional assistance / Altered mentation 0 []  - Support Surface(s) Assessment (bed, cushion, seat, etc.) 0 INTERVENTIONS - Wound Cleansing / Measurement []  - Simple Wound  Cleansing - one wound 0 X - Complex Wound Cleansing - multiple wounds 1 5 X - Wound Imaging (photographs - any number of wounds) 1 5 []  -  Wound Tracing (instead of photographs) 0 []  - Simple Wound Measurement - one wound 0 X - Complex Wound Measurement - multiple wounds 1 5 INTERVENTIONS - Wound Dressings X - Small Wound Dressing one or multiple wounds 1 10 []  - Medium Wound Dressing one or multiple wounds 0 []  - Large Wound Dressing one or multiple wounds 0 []  - Application of Medications - topical 0 []  - Application of Medications - injection 0 [name], [name] INTERVENTIONS - Miscellaneous []  - External ear exam 0 []  - Specimen Collection (cultures, biopsies, blood, body fluids, etc.) 0 []  - Specimen(s) / Culture(s) sent or taken to Lab for analysis 0 []  - Patient Transfer (multiple staff / Harrel Lemon Lift / Similar devices) 0 []  - Simple Staple / Suture removal (25 or less) 0 []  - Complex Staple / Suture removal (26 or more) 0 []  - Hypo / Hyperglycemic Management (close monitor of Blood Glucose) 0 []  - Ankle / Brachial Index (ABI) - do not check if billed separately 0 X - Vital Signs 1 5 Has the patient been seen at the hospital within the last three years: Yes Total Score: 70 Level Of Care: New/Established - Level 2 Electronic Signature(s) Signed: 02/20/2016 3:01:26 PM By: Regan Lemming BSN, RN Entered By: Regan Lemming on 02/20/2016 14:48:15 [name], [name] -------------------------------------------------------------------------------- Encounter Discharge Information Details Patient Name: [name], [name] Date of Service: 02/20/2016 2:15 PM Medical Record Patient Account Number: [id number] Number: Treating RN: Baruch Gouty, RN, BSN, Rita Feb 18, 1941 [phone] y.o. Other Clinician: Date of Birth/Sex: Female) Treating ROBSON, MICHAEL Primary Care Physician: Margarita Rana Physician/Extender: G Referring Physician: [name] in Treatment: 10 Encounter  Discharge Information Items Discharge Pain Level: 0 Discharge Condition: Stable Ambulatory Status: Ambulatory Discharge Destination: Home Transportation: Private Auto Accompanied By: self Schedule Follow-up Appointment: No Medication Reconciliation completed and provided to Patient/Care No Hermes Wafer: Provided on Clinical Summary of Care: 02/20/2016 Form Type Recipient Paper Patient NB Electronic Signature(s) Signed: 02/20/2016 4:14:43 PM By: Regan Lemming BSN, RN Previous Signature: 02/20/2016 2:47:35 PM Version By: Ruthine Dose Entered By: Regan Lemming on 02/20/2016 16:14:43 Blackard, Aunesti L. ([id number]) -------------------------------------------------------------------------------- Lower Extremity Assessment Details Patient Name: [name], [name] Date of Service: 02/20/2016 2:15 PM Medical Record Patient Account Number: [id number] Number: Treating RN: Baruch Gouty, RN, BSN, Rita 01/22/1941 [phone] y.o. Other Clinician: Date of Birth/Sex: Female) Treating ROBSON, MICHAEL Primary Care Physician: Margarita Rana Physician/Extender: G Referring Physician: [name] in Treatment: 10 Vascular Assessment Pulses: Posterior Tibial Dorsalis Pedis Palpable: [Right:Yes] Extremity colors, hair growth, and conditions: Extremity Color: [Right:Normal] Hair Growth on Extremity: [Right:No] Temperature of Extremity: [Right:Warm] Capillary Refill: [Right:< 3 seconds] Toe Nail Assessment Left: Right: Thick: No Discolored: No Deformed: No Improper Length and Hygiene: No Electronic Signature(s) Signed: 02/20/2016 3:01:26 PM By: Regan Lemming BSN, RN Entered By: Regan Lemming on 02/20/2016 14:13:22 Leinberger, Ashyia L. ([id number]) -------------------------------------------------------------------------------- Multi Wound Chart Details Patient Name: [name], [name] Date of Service: 02/20/2016 2:15 PM Medical Record Patient Account Number: [id number] Number: Treating RN: Baruch Gouty,  RN, BSN, Rita Nov 30, 1940 [phone] y.o. Other Clinician: Date of Birth/Sex: Female) Treating ROBSON, MICHAEL Primary Care Physician: Margarita Rana Physician/Extender: G Referring Physician: [name] in Treatment: 10 Vital Signs Height(in): 65 Pulse(bpm): 66 Weight(lbs): 134 Blood Pressure 102/39 (mmHg): Body Mass Index(BMI): 22 Temperature(F): 97.8 Respiratory Rate 16 (breaths/min): Photos: [1:No Photos] [3:No Photos] [N/A:N/A] Wound Location: [1:Right Lower Leg - Lateral Right Malleolus - Lateral,] [3:Distal] [N/A:N/A] Wounding Event: [1:Trauma] [3:Blister] [N/A:N/A] Primary Etiology: [1:Skin Tear] [  3:Skin Tear] [N/A:N/A] Comorbid History: [1:Cataracts, Chronic Obstructive Pulmonary Disease (COPD), Hypertension, Osteoarthritis, Neuropathy, Confinement Neuropathy, Confinement Anxiety] [3:Cataracts, Chronic Obstructive Pulmonary Disease (COPD), Hypertension, Osteoarthritis,  Anxiety] [N/A:N/A] Date Acquired: [1:10/11/2015] [3:02/15/2016] [N/A:N/A] Weeks of Treatment: [1:10] [3:0] [N/A:N/A] Wound Status: [1:Open] [3:Open] [N/A:N/A] Measurements L x W x D 0.1x0.1x0.4 [3:1x1x0.1] [N/A:N/A] (cm) Area (cm) : [1:0.008] [3:0.785] [N/A:N/A] Volume (cm) : [1:0.003] [3:0.079] [N/A:N/A] % Reduction in Area: [1:99.70%] [3:0.00%] [N/A:N/A] % Reduction in Volume: 99.00% [3:0.00%] [N/A:N/A] Classification: [1:Full Thickness Without Exposed Support Structures] [3:Full Thickness Without Exposed Support Structures] [N/A:N/A] Exudate Amount: [1:Medium] [3:Medium] [N/A:N/A] Exudate Type: [1:Serosanguineous] [3:Serosanguineous] [N/A:N/A] Exudate Color: [1:red, brown] [3:red, brown] [N/A:N/A] Wound Margin: [1:Distinct, outline attached Distinct, outline attached] [N/A:N/A] Granulation Amount: Large (67-100%) Small (1-33%) N/A Granulation Quality: Pink, Pale N/A N/A Necrotic Amount: None Present (0%) Large (67-100%) N/A Exposed Structures: Fascia: No Fascia: No N/A Fat: No Fat:  No Tendon: No Tendon: No Muscle: No Muscle: No Joint: No Joint: No Bone: No Bone: No Limited to Skin Limited to Skin Breakdown Breakdown Epithelialization: Large (67-100%) None N/A Periwound Skin Texture: Edema: Yes Edema: Yes N/A Excoriation: No Excoriation: No Induration: No Induration: No Callus: No Callus: No Crepitus: No Crepitus: No Fluctuance: No Fluctuance: No Friable: No Friable: No Rash: No Rash: No Scarring: No Scarring: No Periwound Skin Moist: Yes Maceration: No N/A Moisture: Maceration: No Moist: No Dry/Scaly: No Dry/Scaly: No Periwound Skin Color: Atrophie Blanche: No Atrophie Blanche: No N/A Cyanosis: No Cyanosis: No Ecchymosis: No Ecchymosis: No Erythema: No Erythema: No Hemosiderin Staining: No Hemosiderin Staining: No Mottled: No Mottled: No Pallor: No Pallor: No Rubor: No Rubor: No Temperature: No Abnormality No Abnormality N/A Tenderness on Yes No N/A Palpation: Wound Preparation: Ulcer Cleansing: Topical Anesthetic N/A Rinsed/Irrigated with Applied: Other: lidocaine Saline 4% Topical Anesthetic Applied: Other: lidocaine 4% Treatment Notes Electronic Signature(s) Signed: 02/20/2016 3:01:26 PM By: Elpidio EricAfful, Rita BSN, RN Entered By: Elpidio EricAfful, Rita on 02/20/2016 14:33:08 [name], [name] ([id number]) [name], [name] LMarland Kitchen. ([id number]) -------------------------------------------------------------------------------- Multi-Disciplinary Care Plan Details Patient Name: [name], [name] Date of Service: 02/20/2016 2:15 PM Medical Record Patient Account Number: [id number] Number: Treating RN: Clover MealyAfful, RN, BSN, Rita 1941/08/20 [phone](74 y.o. Other Clinician: Date of Birth/Sex: Female) Treating ROBSON, MICHAEL Primary Care Physician: Lorie PhenixMaloney, Nancy Physician/Extender: G Referring Physician: Leanora IvanoffMaloney, Nancy Weeks in Treatment: 10 Active Inactive Orientation to the Wound Care Program Nursing Diagnoses: Knowledge deficit related to the wound  healing center program Goals: Patient/caregiver will verbalize understanding of the Wound Healing Center Program Date Initiated: 12/06/2015 Goal Status: Active Interventions: Provide education on orientation to the wound center Notes: Venous Leg Ulcer Nursing Diagnoses: Knowledge deficit related to disease process and management Potential for venous Insuffiency (use before diagnosis confirmed) Goals: Non-invasive venous studies are completed as ordered Date Initiated: 12/06/2015 Goal Status: Active Patient will maintain optimal edema control Date Initiated: 12/06/2015 Goal Status: Active Patient/caregiver will verbalize understanding of disease process and disease management Date Initiated: 12/06/2015 Goal Status: Active Verify adequate tissue perfusion prior to therapeutic compression application Date Initiated: 12/06/2015 Goal Status: Active [name], [name] ([id number]) Interventions: Assess peripheral edema status every visit. Compression as ordered Provide education on venous insufficiency Treatment Activities: Non-invasive vascular studies : 12/13/2015 Therapeutic compression applied : 12/13/2015 Notes: Wound/Skin Impairment Nursing Diagnoses: Impaired tissue integrity Knowledge deficit related to smoking impact on wound healing Knowledge deficit related to ulceration/compromised skin integrity Goals: Patient/caregiver will verbalize understanding of skin care regimen Date Initiated: 12/06/2015 Goal Status: Active Ulcer/skin breakdown will have a volume reduction of 30%  by week 4 Date Initiated: 12/06/2015 Goal Status: Active Ulcer/skin breakdown will have a volume reduction of 50% by week 8 Date Initiated: 12/06/2015 Goal Status: Active Ulcer/skin breakdown will have a volume reduction of 80% by week 12 Date Initiated: 12/06/2015 Goal Status: Active Ulcer/skin breakdown will heal within 14 weeks Date Initiated: 12/06/2015 Goal Status: Active Interventions: Assess  patient/caregiver ability to perform ulcer/skin care regimen upon admission and as needed Assess ulceration(s) every visit Provide education on ulcer and skin care Treatment Activities: Referred to DME Sherice Ijames for dressing supplies : 12/13/2015 Skin care regimen initiated : 12/13/2015 Topical wound management initiated : 12/13/2015 DORYCE, ALBERTO ([id number]) Notes: Electronic Signature(s) Signed: 02/20/2016 3:01:26 PM By: Regan Lemming BSN, RN Entered By: Regan Lemming on 02/20/2016 14:32:18 Czerwinski, Synai L. ([id number]) -------------------------------------------------------------------------------- Pain Assessment Details Patient Name: [name], [name] Date of Service: 02/20/2016 2:15 PM Medical Record Patient Account Number: [id number] Number: Treating RN: Baruch Gouty, RN, BSN, Rita 21-Jun-1941 [phone] y.o. Other Clinician: Date of Birth/Sex: Female) Treating ROBSON, MICHAEL Primary Care Physician: Margarita Rana Physician/Extender: G Referring Physician: [name] in Treatment: 10 Active Problems Location of Pain Severity and Description of Pain Patient Has Paino No Site Locations With Dressing Change: No Pain Management and Medication Current Pain Management: Electronic Signature(s) Signed: 02/20/2016 3:01:26 PM By: Regan Lemming BSN, RN Entered By: Regan Lemming on 02/20/2016 14:10:28 Spirito, McKinley. ([id number]) -------------------------------------------------------------------------------- Patient/Caregiver Education Details Patient Name: Espina, Annjeanette L. Date of Service: 02/20/2016 2:15 PM Medical Record Patient Account Number: [id number] Number: Treating RN: Baruch Gouty, RN, BSN, Rita [birth year] [phone] y.o. Other Clinician: Date of Birth/Gender: Female) Treating ROBSON, MICHAEL Primary Care Physician: Margarita Rana Physician/Extender: G Referring Physician: [name] in Treatment: 10 Education Assessment Education Provided To: Patient Education  Topics Provided Venous: Methods: Explain/Verbal Responses: State content correctly Welcome To [facility] [place]: Methods: Explain/Verbal Responses: State content correctly Wound/Skin Impairment: Methods: Explain/Verbal Responses: State content correctly Electronic Signature(s) Signed: 02/20/2016 4:15:47 PM By: Regan Lemming BSN, RN Entered By: Regan Lemming on 02/20/2016 16:14:59 Kumpf, Breanda L. ([id number]) -------------------------------------------------------------------------------- Wound Assessment Details Patient Name: Newbrough, Santita L. Date of Service: 02/20/2016 2:15 PM Medical Record Patient Account Number: [id number] Number: Treating RN: Baruch Gouty, RN, BSN, Rita 1941-05-25 [phone] y.o. Other Clinician: Date of Birth/Sex: Female) Treating ROBSON, [place] Primary Care Physician: Margarita Rana Physician/Extender: G Referring Physician: [name] in Treatment: 10 Wound Status Wound Number: 1 Primary Skin Tear Etiology: Wound Location: Right Lower Leg - Lateral Wound Open Wounding Event: Trauma Status: Date Acquired: 10/11/2015 Comorbid Cataracts, Chronic Obstructive Weeks Of Treatment: 10 History: Pulmonary Disease (COPD), Clustered Wound: No Hypertension, Osteoarthritis, Neuropathy, Confinement Anxiety Photos Photo Uploaded By: Regan Lemming on 02/20/2016 14:59:01 Wound Measurements Length: (cm) 0.1 Width: (cm) 0.1 Depth: (cm) 0.4 Area: (cm) 0.008 Volume: (cm) 0.003 % Reduction in Area: 99.7% % Reduction in Volume: 99% Epithelialization: Large (67-100%) Tunneling: No Undermining: No Wound Description Full Thickness Without Exposed Classification: Support Structures Wound Margin: Distinct, outline attached Exudate Medium Amount: Exudate Type: Serosanguineous Exudate Color: red, brown Philipson, Lanita L. ([id number]) Foul Odor After Cleansing: No Wound Bed Granulation Amount: Large (67-100%) Exposed Structure Granulation Quality: Pink,  Pale Fascia Exposed: No Necrotic Amount: None Present (0%) Fat Layer Exposed: No Tendon Exposed: No Muscle Exposed: No Joint Exposed: No Bone Exposed: No Limited to Skin Breakdown Periwound Skin Texture Texture Color No Abnormalities Noted: No No Abnormalities Noted: No Callus: No Atrophie Blanche: No Crepitus: No Cyanosis: No Excoriation: No Ecchymosis: No Fluctuance: No  Erythema: No Friable: No Hemosiderin Staining: No Induration: No Mottled: No Localized Edema: Yes Pallor: No Rash: No Rubor: No Scarring: No Temperature / Pain Moisture Temperature: No Abnormality No Abnormalities Noted: No Tenderness on Palpation: Yes Dry / Scaly: No Maceration: No Moist: Yes Wound Preparation Ulcer Cleansing: Rinsed/Irrigated with Saline Topical Anesthetic Applied: Other: lidocaine 4%, Treatment Notes Wound #1 (Right, Lateral Lower Leg) 1. Cleansed with: Clean wound with Normal Saline 4. Dressing Applied: Iodosorb Ointment 5. Secondary [place] Signature(s) Signed: 02/20/2016 3:01:26 PM By: Regan Lemming BSN, RN Entered By: Regan Lemming on 02/20/2016 14:32:53 Gallery, Aurore L. ([id number]) -------------------------------------------------------------------------------- Wound Assessment Details Patient Name: Shanks, Tamaira L. Date of Service: 02/20/2016 2:15 PM Medical Record Patient Account Number: [id number] Number: Treating RN: Baruch Gouty, RN, BSN, Rita August 08, 1941 [phone] y.o. Other Clinician: Date of Birth/Sex: Female) Treating ROBSON, MICHAEL Primary Care Physician: Margarita Rana Physician/Extender: G Referring Physician: [name] in Treatment: 10 Wound Status Wound Number: 3 Primary Skin Tear Etiology: Wound Location: Right Malleolus - Lateral, Distal Wound Open Wounding Event: Blister Status: Date Acquired: 02/15/2016 Comorbid Cataracts, Chronic Obstructive Weeks Of Treatment: 0 History: Pulmonary Disease  (COPD), Clustered Wound: No Hypertension, Osteoarthritis, Neuropathy, Confinement Anxiety Photos Photo Uploaded By: Regan Lemming on 02/20/2016 14:59:01 Wound Measurements Length: (cm) 1 Width: (cm) 1 Depth: (cm) 0.1 Area: (cm) 0.785 Volume: (cm) 0.079 % Reduction in Area: 0% % Reduction in Volume: 0% Epithelialization: None Tunneling: No Wound Description Full Thickness Without Exposed Classification: Support Structures Wound Margin: Distinct, outline attached Exudate Medium Amount: Exudate Type: Serosanguineous Exudate Color: red, brown Bonifas, Maisyn L. ([id number]) Foul Odor After Cleansing: No Wound Bed Granulation Amount: Small (1-33%) Exposed Structure Necrotic Amount: Large (67-100%) Fascia Exposed: No Necrotic Quality: Adherent Slough Fat Layer Exposed: No Tendon Exposed: No Muscle Exposed: No Joint Exposed: No Bone Exposed: No Limited to Skin Breakdown Periwound Skin Texture Texture Color No Abnormalities Noted: No No Abnormalities Noted: No Callus: No Atrophie Blanche: No Crepitus: No Cyanosis: No Excoriation: No Ecchymosis: No Fluctuance: No Erythema: No Friable: No Hemosiderin Staining: No Induration: No Mottled: No Localized Edema: Yes Pallor: No Rash: No Rubor: No Scarring: No Temperature / Pain Moisture Temperature: No Abnormality No Abnormalities Noted: No Dry / Scaly: No Maceration: No Moist: No Wound Preparation Topical Anesthetic Applied: Other: lidocaine 4%, Electronic Signature(s) Signed: 02/20/2016 3:01:26 PM By: Regan Lemming BSN, RN Entered By: Regan Lemming on 02/20/2016 14:21:21 Bowland, Yarelin L. ([id number]) -------------------------------------------------------------------------------- Vitals Details Patient Name: Radoncic, Julieanne L. Date of Service: 02/20/2016 2:15 PM Medical Record Patient Account Number: [id number] Number: Treating RN: Baruch Gouty, RN, BSN, Rita [birth year] [phone] y.o. Other Clinician: Date of  Birth/Sex: Female) Treating ROBSON, MICHAEL Primary Care Physician: Margarita Rana Physician/Extender: G Referring Physician: [name] in Treatment: 10 Vital Signs Time Taken: 14:11 Temperature (F): 97.8 Height (in): 65 Pulse (bpm): 66 Weight (lbs): 134 Respiratory Rate (breaths/min): 16 Body Mass Index (BMI): 22.3 Blood Pressure (mmHg): 102/39 Reference Range: 80 - 120 mg / dl Electronic Signature(s) Signed: 02/20/2016 3:01:26 PM By: Regan Lemming BSN, RN Entered By: Regan Lemming on 02/20/2016 14:13:45

## 2016-02-21 NOTE — Progress Notes (Signed)
ADAIR, BADMAN ([id number]) Visit Report for 02/20/2016 Chief Complaint Document Details Patient Name: [name] [name], [name] Date of Service: 02/20/2016 2:15 PM Medical Record Patient Account Number: [id number] Number: Treating RN: Baruch Gouty, RN, BSN, Rita [birth year] [phone] y.o. Other Clinician: Date of Birth/Sex: Female) Treating Mikeal Winstanley Primary Care Physician/Extender: Lonn Georgia Physician: Referring Physician: [name] in Treatment: 10 Information Obtained from: Patient Chief Complaint Patient is here for a chronic wound on her right anterior lateral leg Electronic Signature(s) Signed: 02/21/2016 12:38:30 PM By: Linton Ham MD Entered By: Linton Ham on 02/21/2016 07:54:39 [name] [name], Linn. ([id number]) -------------------------------------------------------------------------------- HPI Details Patient Name: Meddings, Tressy L. Date of Service: 02/20/2016 2:15 PM Medical Record Patient Account Number: [id number] Number: Treating RN: Baruch Gouty, RN, BSN, Rita 1941-03-23 [phone] y.o. Other Clinician: Date of Birth/Sex: Female) Treating Neeko Pharo Primary Care Physician/Extender: Lonn Georgia Physician: Referring Physician: [name] in Treatment: 10 History of Present Illness HPI Description: 12/06/15; this is a patient who has no prior wound history and is not a diabetic. In February she was getting into her family truck and the wind pushed the door against the outer aspect of her right lower leg. I believe there was initially some swelling but not clearly a hematoma. 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 Has been using topical antibiotics on this. 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 There is already been some improvement. She has no prior history of PAD and no major history of venous insufficiency. 12/13/15; wound appears healthy, using prisma 12/20/15; patient arrives complaining of  increasing pain around the wound. She also had a blister under the wound. 12/26/15; the patient arrives with the blister last week fully excised and healed however she has another floppy flaccid blister just below her wound. The wound does not appear to be infected. The cause of this is not really clear, he does walk 3 miles a day but doesn't think that the area with a blister is currently rubs the bandage at all. She said she felt a stinging and then noticed this on the weekend. She will complete the antibiotics I gave hertomorrow 01/02/16; the patient's original wound in the right lower leg requires debridement of surface slough and surrounding eschar. The base of this then looks healthy. The area that was a blister underneath this of uncertain etiology also appears to be better. Culture of this blister from last week was negative 01/09/16; the patient's original wound on the lower right lateral calf requires debridement of surface slough and surrounding eschar. Base of this looks healthy once again. She has another recurrent blister underneath this wound. This is his second one of these. I cultured the last one which was negative. The patient is very active and I wonder whether this has something to do with this there is no evidence of surrounding cellulitis 01/16/16; the patient's original wound on the lower right calf again requires debridement. Her subsequent wound below this which was a blister last week also has a fibrinous surface slough that requires debridement. Again the etiology of these wounds isn't completely clear to me. She does not have PAD, her ABI in this clinic was 1.17 on the right. She may have mild venous reflux but certainly does not have frank venous inflammation. She is very active telling me she walked 5 miles this weekend but states that she did not have anything that showed of caused pressure friction on the wound area.  01/23/16 both the patient's wounds appear to be  improved. Her subsequent wound which was a blister also appears to be smaller. 01/30/16; the inferior wound which was a secondary wound has resolved. Her original wound appears better. Still using Iodoflex 02/06/16; surprisingly the patient's wound is almost totally closed over except for a small probing, I'll on the medial aspect of the circumference of the wound. The issue here is she has never had tunneling in this, most of this was always superficial. This has a clear, nonpurulent, looking drainage 02/20/16: Patient arrives today stating she had an intensely puritic area just below her remaining small [name] [name], [name] ([id number]) wound. When she could no longer stand it she scratched a blister and now has a new area. There may be some swelling of the entire part of her leg ocontact dermatitis however she has not erythema OR PAIN and the wound no longer is Medical illustrator) Signed: 02/21/2016 12:38:30 PM By: Linton Ham MD Entered By: Linton Ham on 02/21/2016 07:57:10 [name] [name], [name] ([id number]) -------------------------------------------------------------------------------- Physical Exam Details Patient Name: [name] [name], [name] Date of Service: 02/20/2016 2:15 PM Medical Record Patient Account Number: [id number] Number: Treating RN: Baruch Gouty, RN, BSN, Rita 01-Apr-1941 [phone] y.o. Other Clinician: Date of Birth/Sex: Female) Treating Bradrick Kamau Primary Care Physician/Extender: Lonn Georgia Physician: Referring Physician: [name] in Treatment: 10 Notes Wound exam; she has a new clean-looking wound just below the small area that we have been dealing with most recently. The original wound has a small deeply probing area approximately 4 mm. There is no evidence of infection here. This skin around this area appears to be irritated by something there looks as though there is some swelling but no erythema I am really uncertain about what is causing  this Electronic Signature(s) Signed: 02/21/2016 12:38:30 PM By: Linton Ham MD Entered By: Linton Ham on 02/21/2016 07:58:23 [name] [name], [name] ([id number]) -------------------------------------------------------------------------------- Physician Orders Details Patient Name: [name] [name], [name] Date of Service: 02/20/2016 2:15 PM Medical Record Patient Account Number: [id number] Number: Treating RN: Baruch Gouty, RN, BSN, Rita Aug 22, 1941 [phone] y.o. Other Clinician: Date of Birth/Sex: Female) Treating Erinn Mendosa Primary Care Physician/Extender: Lonn Georgia Physician: Referring Physician: [name] in Treatment: 10 Verbal / Phone Orders: Yes Clinician: Afful, RN, BSN, Rita Read Back and Verified: Yes Diagnosis Coding Wound Cleansing Wound #1 Right,Lateral Lower Leg o Cleanse wound with mild soap and water o May Shower, gently pat wound dry prior to applying new dressing. o May shower with protection. Anesthetic Wound #1 Right,Lateral Lower Leg o Topical Lidocaine 4% cream applied to wound bed prior to debridement Skin Barriers/Peri-Wound Care Wound #1 Right,Lateral Lower Leg o Barrier cream Primary Wound Dressing Wound #1 Right,Lateral Lower Leg o Iodoflex Wound #3 Right,Distal,Lateral Malleolus o Aquacel Ag Secondary Dressing Wound #1 Right,Lateral Lower Leg o Non-adherent pad Wound #3 Right,Distal,Lateral Malleolus o Non-adherent pad Dressing Change Frequency Wound #1 Right,Lateral Lower Leg o Change dressing every other day. Wound #3 Right,Distal,Lateral Malleolus [name] [name], Damariz L. ([id number]) o Change dressing every other day. Follow-up Appointments Wound #1 Right,Lateral Lower Leg o Return Appointment in 1 week. Wound #3 Right,Distal,Lateral Malleolus o Return Appointment in 1 week. Additional Orders / Instructions Wound #1 Right,Lateral Lower Leg o Increase protein intake. o Activity as tolerated Wound #3  Right,Distal,Lateral Malleolus o Increase protein intake. o Activity as tolerated Electronic Signature(s) Signed: 02/20/2016 2:47:03 PM By: Regan Lemming BSN, RN Signed: 02/21/2016 12:38:30 PM By: Linton Ham MD Entered By: Regan Lemming  on 02/20/2016 14:47:02 Ollis, Emalee L. ([id number]) -------------------------------------------------------------------------------- Problem List Details Patient Name: [name] [name], [name] Date of Service: 02/20/2016 2:15 PM Medical Record Patient Account Number: [id number] Number: Treating RN: Baruch Gouty, RN, BSN, Rita March 13, 1941 [phone] y.o. 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 -------------------------------------------------------------------------------- Progress Note Details Patient Name: [name] [name], [name] Date of Service: 02/20/2016 2:15 PM Medical Record Patient Account Number: [id number] Number: Treating RN: Baruch Gouty, RN, BSN, Rita 1941/08/08 [phone] y.o. Other Clinician: Date of Birth/Sex: Female) Treating Tyannah Sane Primary Care Physician/Extender: Lonn Georgia Physician: Referring Physician: [name] in Treatment: 10 Subjective Chief Complaint Information obtained from Patient Patient is here for a chronic wound on her right anterior lateral leg History of Present Illness (HPI) 12/06/15; this is a patient who has no prior wound history and is  not a diabetic. In February she was getting into her family truck and the wind pushed the door against the outer aspect of her right lower leg. I believe there was initially some swelling but not clearly a hematoma. 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 Has been using topical antibiotics on this. 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 There is already been some improvement. She has no prior history of PAD and no major history of venous insufficiency. 12/13/15; wound appears healthy, using prisma 12/20/15; patient arrives complaining of increasing pain around the wound. She also had a blister under the wound. 12/26/15; the patient arrives with the blister last week fully excised and healed however she has another floppy flaccid blister just below her wound. The wound does not appear to be infected. The cause of this is not really clear, he does walk 3 miles a day but doesn't think that the area with a blister is currently rubs the bandage at all. She said she felt a stinging and then noticed this on the weekend. She will complete the antibiotics I gave hertomorrow 01/02/16; the patient's original wound in the right lower leg requires debridement of surface slough and surrounding eschar. The base of this then looks healthy. The area that was a blister underneath this of uncertain etiology also appears to be better. Culture of this blister from last week was negative 01/09/16; the patient's original wound on the lower right lateral calf requires debridement of surface slough and surrounding eschar. Base of this looks healthy once again. She has another recurrent blister underneath this wound. This is his second one of these. I cultured the last one which was negative. The patient is very active and I wonder whether this has something to do with this there is no evidence of surrounding cellulitis 01/16/16; the patient's original wound on the lower right calf  again requires debridement. Her subsequent wound below this which was a blister last week also has a fibrinous surface slough that requires debridement. Again the etiology of these wounds isn't completely clear to me. She does not have PAD, her ABI in this clinic was 1.17 on the right. She may have mild venous reflux but certainly does not  have frank venous inflammation. She is very active telling me she walked 5 miles this weekend but states that she did not have anything that showed of caused pressure friction on the wound area. 01/23/16 both the patient's wounds appear to be improved. Her subsequent wound which was a blister also appears to be smaller. [name] [name], [name] 01/30/16; the inferior wound which was a secondary wound has resolved. Her original wound appears better. Still using Iodoflex 02/06/16; surprisingly the patient's wound is almost totally closed over except for a small probing, I'll on the medial aspect of the circumference of the wound. The issue here is she has never had tunneling in this, most of this was always superficial. This has a clear, nonpurulent, looking drainage 02/20/16: Patient arrives today stating she had an intensely puritic area just below her remaining small wound. When she could no longer stand it she scratched a blister and now has a new area. There may be some swelling of the entire part of her leg ocontact dermatitis however she has not erythema OR PAIN and the wound no longer is itichy Objective Constitutional Vitals Time Taken: 2:11 PM, Height: 65 in, Weight: 134 lbs, BMI: 22.3, Temperature: 97.8 F, Pulse: 66 bpm, Respiratory Rate: 16 breaths/min, Blood Pressure: 102/39 mmHg. Integumentary (Hair, Skin) Wound #1 status is Open. Original cause of wound was Trauma. The wound is located on the Right,Lateral Lower Leg. The wound measures 0.1cm length x 0.1cm width x 0.4cm depth; 0.008cm^2 area and 0.003cm^3 volume. The wound is limited to skin  breakdown. There is no tunneling or undermining noted. There is a medium amount of serosanguineous drainage noted. The wound margin is distinct with the outline attached to the wound base. There is large (67-100%) pink, pale granulation within the wound bed. There is no necrotic tissue within the wound bed. The periwound skin appearance exhibited: Localized Edema, Moist. The periwound skin appearance did not exhibit: Callus, Crepitus, Excoriation, Fluctuance, Friable, Induration, Rash, Scarring, Dry/Scaly, Maceration, Atrophie Blanche, Cyanosis, Ecchymosis, Hemosiderin Staining, Mottled, Pallor, Rubor, Erythema. Periwound temperature was noted as No Abnormality. The periwound has tenderness on palpation. Wound #3 status is Open. Original cause of wound was Blister. The wound is located on the Right,Distal,Lateral Lower Leg. The wound measures 1cm length x 1cm width x 0.1cm depth; 0.785cm^2 area and 0.079cm^3 volume. The wound is limited to skin breakdown. There is no tunneling noted. There is a medium amount of serosanguineous drainage noted. The wound margin is distinct with the outline attached to the wound base. There is small (1-33%) granulation within the wound bed. There is a large (67- 100%) amount of necrotic tissue within the wound bed including Adherent Slough. The periwound skin appearance exhibited: Localized Edema. The periwound skin appearance did not exhibit: Callus, Crepitus, Excoriation, Fluctuance, Friable, Induration, Rash, Scarring, Dry/Scaly, Maceration, Moist, Atrophie Blanche, Cyanosis, Ecchymosis, Hemosiderin Staining, Mottled, Pallor, Rubor, Erythema. Periwound temperature was noted as No Abnormality. Assessment [name] [name], 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 May shower with protection. Anesthetic: Wound #1 Right,Lateral Lower Leg: Topical Lidocaine 4% cream applied to wound bed prior to debridement Skin Barriers/Peri-Wound Care: Wound #1 Right,Lateral Lower Leg: Barrier cream Primary Wound Dressing: Wound #1 Right,Lateral Lower Leg: Iodoflex Wound #3 Right,Distal,Lateral Malleolus: Aquacel Ag  Secondary Dressing: Wound #1 Right,Lateral Lower Leg: Non-adherent pad Wound #3 Right,Distal,Lateral Malleolus: Non-adherent pad Dressing Change Frequency: Wound #1 Right,Lateral Lower Leg: Change dressing every other day. Wound #3 Right,Distal,Lateral Malleolus: Change dressing every other day. Follow-up Appointments: Wound #1 Right,Lateral Lower Leg: Return Appointment in 1 week. Wound #3 Right,Distal,Lateral Malleolus: Return Appointment in 1 week. Additional Orders / Instructions: Wound #1 Right,Lateral Lower Leg: Increase protein intake. Activity as tolerated Wound #3 Right,Distal,Lateral Malleolus: Increase protein intake. Defreitas, Lashay L. ([id number]) Activity as tolerated We continued with Iodoflex to the small open area that was her original wound, Aquacel Ag to the new area. TCA to the skin around this, we applied a nonadherent pad encases is some form of allergic reaction to the foam. Her original wound was trauma however subsequent wounds all of been blisters or irritation sometime associated with scratching Electronic Signature(s) Signed: 02/21/2016 12:38:30 PM By: Linton Ham MD Entered By: Linton Ham on 02/21/2016 07:59:46 Maney, Barbette L. ([id number]) -------------------------------------------------------------------------------- SuperBill Details Patient Name: Aicher, Tanisia L. Date of Service: 02/20/2016 Medical Record Patient Account Number: [id number] Number: Treating RN: Baruch Gouty, RN, BSN, Rita 06-11-1941 [phone] y.o. Other  Clinician: Date of Birth/Sex: Female) Treating Loye Vento Primary Care Physician/Extender: Lonn Georgia Physician: Suella Grove in Treatment: 10 Referring Physician: Margarita Rana Diagnosis Coding ICD-10 Codes Code Description [id number] Non-pressure chronic ulcer of right calf with necrosis of muscle I87.2 Venous insufficiency (chronic) (peripheral) Facility Procedures CPT4 Code: [id number] Description: [phone] - WOUND CARE VISIT-LEV 2 EST PT Modifier: Quantity: 1 Physician Procedures CPT4 Code Description: [id number] - WC PHYS LEVEL 2 - EST PT ICD-10 Description Diagnosis L97.213 Non-pressure chronic ulcer of right calf with necro Modifier: sis of muscle Quantity: 1 Electronic Signature(s) Signed: 02/21/2016 12:38:30 PM By: Linton Ham MD Entered By: Linton Ham on 02/21/2016 08:00:19

## 2016-02-28 ENCOUNTER — Encounter: Payer: PPO | Attending: Internal Medicine | Admitting: Internal Medicine

## 2016-02-28 DIAGNOSIS — L97213 Non-pressure chronic ulcer of right calf with necrosis of muscle: Secondary | ICD-10-CM | POA: Diagnosis not present

## 2016-02-28 DIAGNOSIS — S81801A Unspecified open wound, right lower leg, initial encounter: Secondary | ICD-10-CM | POA: Diagnosis not present

## 2016-02-28 DIAGNOSIS — I872 Venous insufficiency (chronic) (peripheral): Secondary | ICD-10-CM | POA: Diagnosis not present

## 2016-02-29 DIAGNOSIS — H43813 Vitreous degeneration, bilateral: Secondary | ICD-10-CM | POA: Diagnosis not present

## 2016-02-29 NOTE — Progress Notes (Signed)
[name] [name] ([id number]) Visit Report for 02/28/2016 Chief Complaint Document Details Patient Name: [name] [name], [name] Date of Service: 02/28/2016 12:45 PM Medical Record Patient Account Number: [id number] Number: Treating RN: Baruch Gouty, RN, BSN, Rita 04/10/1941 [phone] y.o. Other Clinician: Date of Birth/Sex: Female) Treating ROBSON, MICHAEL Primary Care Physician/Extender: Lonn Georgia Physician: Referring Physician: [name] in Treatment: 12 Information Obtained from: Patient Chief Complaint Patient is here for a chronic wound on her right anterior lateral leg Electronic Signature(s) Signed: 02/29/2016 7:30:59 AM By: Linton Ham MD Entered By: Linton Ham on 02/28/2016 13:12:16 Nghiem, Destony L. ([id number]) -------------------------------------------------------------------------------- HPI Details Patient Name: Begay, Ellamae L. Date of Service: 02/28/2016 12:45 PM Medical Record Patient Account Number: [id number] Number: Treating RN: Baruch Gouty, RN, BSN, Rita 1941-04-30 [phone] y.o. Other Clinician: Date of Birth/Sex: Female) Treating ROBSON, MICHAEL Primary Care Physician/Extender: Lonn Georgia Physician: Referring Physician: [name] in Treatment: 12 History of Present Illness HPI Description: 12/06/15; this is a patient who has no prior wound history and is not a diabetic. In February she was getting into her family truck and the wind pushed the door against the outer aspect of her right lower leg. I believe there was initially some swelling but not clearly a hematoma. 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 Has been using topical antibiotics on this. 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 There is already been some improvement. She has no prior history of PAD and no major history of venous insufficiency. 12/13/15; wound appears healthy, using prisma 12/20/15; patient arrives complaining of  increasing pain around the wound. She also had a blister under the wound. 12/26/15; the patient arrives with the blister last week fully excised and healed however she has another floppy flaccid blister just below her wound. The wound does not appear to be infected. The cause of this is not really clear, he does walk 3 miles a day but doesn't think that the area with a blister is currently rubs the bandage at all. She said she felt a stinging and then noticed this on the weekend. She will complete the antibiotics I gave hertomorrow 01/02/16; the patient's original wound in the right lower leg requires debridement of surface slough and surrounding eschar. The base of this then looks healthy. The area that was a blister underneath this of uncertain etiology also appears to be better. Culture of this blister from last week was negative 01/09/16; the patient's original wound on the lower right lateral calf requires debridement of surface slough and surrounding eschar. Base of this looks healthy once again. She has another recurrent blister underneath this wound. This is his second one of these. I cultured the last one which was negative. The patient is very active and I wonder whether this has something to do with this there is no evidence of surrounding cellulitis 01/16/16; the patient's original wound on the lower right calf again requires debridement. Her subsequent wound below this which was a blister last week also has a fibrinous surface slough that requires debridement. Again the etiology of these wounds isn't completely clear to me. She does not have PAD, her ABI in this clinic was 1.17 on the right. She may have mild venous reflux but certainly does not have frank venous inflammation. She is very active telling me she walked 5 miles this weekend but states that she did not have anything that showed of caused pressure friction on the wound area.  01/23/16 both the patient's wounds appear to be  improved. Her subsequent wound which was a blister also appears to be smaller. 01/30/16; the inferior wound which was a secondary wound has resolved. Her original wound appears better. Still using Iodoflex 02/06/16; surprisingly the patient's wound is almost totally closed over except for a small probing, I'll on the medial aspect of the circumference of the wound. The issue here is she has never had tunneling in this, most of this was always superficial. This has a clear, nonpurulent, looking drainage 02/20/16: Patient arrives today stating she had an intensely puritic area just below her remaining small Ruddock, [name] wound. When she could no longer stand it she scratched a blister and now has a new area. There may be some swelling of the entire part of her leg ocontact dermatitis however she has not erythema OR PAIN and the wound no longer is itichy 02/28/16; everything seems to be much better except for the small deep wound part of the original area. This goes down roughly 0.5 cm. There is no subcutaneous tenderness crepitus or purulence. She has been using Iodosorb. She is traveling to [facility] next week Electronic Signature(s) Signed: 02/29/2016 7:30:59 AM By: Linton Ham MD Entered By: Linton Ham on 02/28/2016 13:13:23 Grotz, [name] ([id number]) -------------------------------------------------------------------------------- Physical Exam Details Patient Name: Deguzman, Anay L. Date of Service: 02/28/2016 12:45 PM Medical Record Patient Account Number: [id number] Number: Treating RN: Baruch Gouty, RN, BSN, Rita 1941/05/22 [phone] y.o. Other Clinician: Date of Birth/Sex: Female) Treating ROBSON, MICHAEL Primary Care Physician/Extender: Lonn Georgia Physician: Referring Physician: [name] in Treatment: 12 Constitutional Sitting or standing Blood Pressure is within target range for patient.. Pulse regular and within target range for  patient.Marland Kitchen Respirations regular, non-labored and within target range.. Temperature is normal and within the target range for the patient.. Cardiovascular Pedal pulses palpable and strong bilaterally.. Notes Wound exam; there is only 1 small open area once again that we are dealing with here. This however is quite deep in comparison with the overall surface area. The the other area below this last week has closed over. Electronic Signature(s) Signed: 02/29/2016 7:30:59 AM By: Linton Ham MD Entered By: Linton Ham on 02/28/2016 13:14:47 Carvalho, [name] ([id number]) -------------------------------------------------------------------------------- Physician Orders Details Patient Name: Muecke, Odelia L. Date of Service: 02/28/2016 12:45 PM Medical Record Patient Account Number: [id number] Number: Treating RN: Baruch Gouty, RN, BSN, Rita 27-Nov-1940 [phone] y.o. Other Clinician: Date of Birth/Sex: Female) Treating ROBSON, MICHAEL Primary Care Physician/Extender: Lonn Georgia Physician: Referring Physician: [name] in Treatment: 12 Verbal / Phone Orders: Yes Clinician: Afful, RN, BSN, Rita Read Back and Verified: Yes Diagnosis Coding Wound Cleansing Wound #1 Right,Lateral Lower Leg o Cleanse wound with mild soap and water o May Shower, gently pat wound dry prior to applying new dressing. o May shower with protection. Anesthetic Wound #1 Right,Lateral Lower Leg o Topical Lidocaine 4% cream applied to wound bed prior to debridement Skin Barriers/Peri-Wound Care Wound #1 Right,Lateral Lower Leg o Barrier cream Primary Wound Dressing Wound #1 Right,Lateral Lower Leg o Iodoflex Secondary Dressing Wound #1 Right,Lateral Lower Leg o Non-adherent pad Dressing Change Frequency Wound #1 Right,Lateral Lower Leg o Change dressing every other day. Follow-up Appointments Wound #1 Right,Lateral Lower Leg o Return Appointment in 1 week. Edema  Control Wound #1 Right,Lateral Lower Leg Koplin, Mckensie L. ([id number]) o Other: - coban wrapped from ankle to mid calf Additional Orders / Instructions Wound #1 Right,Lateral Lower Leg o  Increase protein intake. o Activity as tolerated Electronic Signature(s) Signed: 02/28/2016 5:42:05 PM By: Regan Lemming BSN, RN Signed: 02/29/2016 7:30:59 AM By: Linton Ham MD Entered By: Regan Lemming on 02/28/2016 13:06:33 Bordley, Kinberly L. ([id number]) -------------------------------------------------------------------------------- Problem List Details Patient Name: Sage, Makaila L. Date of Service: 02/28/2016 12:45 PM Medical Record Patient Account Number: [id number] Number: Treating RN: Baruch Gouty, RN, BSN, Rita 1941/01/16 [phone] y.o. 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 ([id number]) -------------------------------------------------------------------------------- Progress Note Details Patient Name: Nowaczyk, Zalma L. Date of Service: 02/28/2016 12:45 PM Medical Record Patient Account Number: [id number] Number: Treating RN: Baruch Gouty, RN, BSN, Rita 1940/09/18 [phone] y.o. Other Clinician: Date of Birth/Sex: Female) Treating ROBSON, MICHAEL Primary Care Physician/Extender: Lonn Georgia Physician: Referring Physician: [name] in Treatment: 12 Subjective Chief Complaint Information obtained from Patient Patient is here  for a chronic wound on her right anterior lateral leg History of Present Illness (HPI) 12/06/15; this is a patient who has no prior wound history and is not a diabetic. In February she was getting into her family truck and the wind pushed the door against the outer aspect of her right lower leg. I believe there was initially some swelling but not clearly a hematoma. 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 Has been using topical antibiotics on this. 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 There is already been some improvement. She has no prior history of PAD and no major history of venous insufficiency. 12/13/15; wound appears healthy, using prisma 12/20/15; patient arrives complaining of increasing pain around the wound. She also had a blister under the wound. 12/26/15; the patient arrives with the blister last week fully excised and healed however she has another floppy flaccid blister just below her wound. The wound does not appear to be infected. The cause of this is not really clear, he does walk 3 miles a day but doesn't think that the area with a blister is currently rubs the bandage at all. She said she felt a stinging and then noticed this on the weekend. She will complete the antibiotics I gave hertomorrow 01/02/16; the patient's original wound in the right lower leg requires debridement of surface slough and surrounding eschar. The base of this then looks healthy. The area that was a blister underneath this of uncertain etiology also appears to be better. Culture of this blister from last week was negative 01/09/16; the patient's original wound on the lower right lateral calf requires debridement of surface slough and surrounding eschar. Base of this looks healthy once again. She has another recurrent blister underneath this wound. This is his second one of these. I cultured the last one which was negative. The patient is very active and I wonder  whether this has something to do with this there is no evidence of surrounding cellulitis 01/16/16; the patient's original wound on the lower right calf again requires debridement. Her subsequent wound below this which was a blister last week also has a fibrinous surface slough that requires debridement. Again the etiology of these wounds  isn't completely clear to me. She does not have PAD, her ABI in this clinic was 1.17 on the right. She may have mild venous reflux but certainly does not have frank venous inflammation. She is very active telling me she walked 5 miles this weekend but states that she did not have anything that showed of caused pressure friction on the wound area. 01/23/16 both the patient's wounds appear to be improved. Her subsequent wound which was a blister also appears to be smaller. Percival, Charlaine L. ([id number]) 01/30/16; the inferior wound which was a secondary wound has resolved. Her original wound appears better. Still using Iodoflex 02/06/16; surprisingly the patient's wound is almost totally closed over except for a small probing, I'll on the medial aspect of the circumference of the wound. The issue here is she has never had tunneling in this, most of this was always superficial. This has a clear, nonpurulent, looking drainage 02/20/16: Patient arrives today stating she had an intensely puritic area just below her remaining small wound. When she could no longer stand it she scratched a blister and now has a new area. There may be some swelling of the entire part of her leg ocontact dermatitis however she has not erythema OR PAIN and the wound no longer is itichy 02/28/16; everything seems to be much better except for the small deep wound part of the original area. This goes down roughly 0.5 cm. There is no subcutaneous tenderness crepitus or purulence. She has been using Iodosorb. She is traveling to [place] next week Objective Constitutional Sitting or  standing Blood Pressure is within target range for patient.. Pulse regular and within target range for patient.Marland Kitchen Respirations regular, non-labored and within target range.. Temperature is normal and within the target range for the patient.. Vitals Time Taken: 12:49 PM, Height: 65 in, Weight: 134 lbs, BMI: 22.3, Temperature: 98.2 F, Pulse: 72 bpm, Respiratory Rate: 16 breaths/min, Blood Pressure: 112/38 mmHg. Cardiovascular Pedal pulses palpable and strong bilaterally.. General Notes: Wound exam; there is only 1 small open area once again that we are dealing with here. This however is quite deep in comparison with the overall surface area. The the other area below this last week has closed over. Integumentary (Hair, Skin) Wound #1 status is Open. Original cause of wound was Trauma. The wound is located on the Right,Lateral Lower Leg. The wound measures 0.1cm length x 0.1cm width x 0.4cm depth; 0.008cm^2 area and 0.003cm^3 volume. The wound is limited to skin breakdown. There is no tunneling or undermining noted. There is a small amount of serosanguineous drainage noted. The wound margin is distinct with the outline attached to the wound base. There is large (67-100%) pink, pale granulation within the wound bed. There is no necrotic tissue within the wound bed. The periwound skin appearance exhibited: Localized Edema, Moist. The periwound skin appearance did not exhibit: Callus, Crepitus, Excoriation, Fluctuance, Friable, Induration, Rash, Scarring, Dry/Scaly, Maceration, Atrophie Blanche, Cyanosis, Ecchymosis, Hemosiderin Staining, Mottled, Pallor, Rubor, Erythema. Periwound temperature was noted as No Abnormality. The periwound has tenderness on palpation. Parrillo, Araiyah L. 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 May shower with protection. Anesthetic: Wound #1 Right,Lateral Lower Leg: Topical Lidocaine 4% cream applied to wound bed prior to debridement Skin Barriers/Peri-Wound  Care: Wound #1 Right,Lateral Lower Leg: Barrier cream Primary Wound Dressing: Wound #1 Right,Lateral Lower Leg: Iodoflex Secondary Dressing: Wound #1 Right,Lateral Lower Leg: Non-adherent pad Dressing Change Frequency: Wound #1 Right,Lateral Lower Leg: Change dressing every other day. Follow-up Appointments: Wound #1 Right,Lateral Lower Leg: Return Appointment in 1 week. Edema Control: Wound #1 Right,Lateral Lower Leg: Other: - coban wrapped from ankle to mid calf Additional Orders / Instructions: Wound #1 Right,Lateral Lower Leg: Increase protein intake. Activity as tolerated Lohr, Abbagale L. ([id number]) #1 there is not a years number of dressings that will fit in a small area with this. We will continue with the Iodosorb. We'll add some more compression over the same area with Coban Electronic Signature(s) Signed: 02/29/2016 7:30:59 AM By: Linton Ham MD Entered By: Linton Ham on 02/28/2016 13:15:21 Kitts, Kayliee L. ([id number]) -------------------------------------------------------------------------------- SuperBill Details Patient Name: Rackley, Daesia L. Date of Service: 02/28/2016 Medical Record Patient Account Number: [id number] Number: Treating RN: Baruch Gouty, RN, BSN, Rita Nov 15, 1940 [phone] y.o. Other Clinician: Date of Birth/Sex: Female) Treating ROBSON, MICHAEL Primary Care Physician/Extender: Lonn Georgia Physician: Suella Grove in Treatment: 12 Referring Physician: Margarita Rana Diagnosis Coding ICD-10 Codes Code Description [phone] Non-pressure chronic ulcer of right calf with necrosis of muscle I87.2 Venous insufficiency (chronic) (peripheral) Facility Procedures CPT4  Code: [id number] Description: [phone] - WOUND CARE VISIT-LEV 2 EST PT Modifier: Quantity: 1 Physician Procedures CPT4 Code Description: [id number] - WC PHYS LEVEL 2 - EST PT ICD-10 Description Diagnosis L97.213 Non-pressure chronic ulcer of right calf with necro Modifier: sis of muscle Quantity: 1 Electronic Signature(s) Signed: 02/29/2016 7:30:59 AM By: Linton Ham MD Entered By: Linton Ham on 02/28/2016 13:16:16

## 2016-02-29 NOTE — Progress Notes (Signed)
[name] Visit Report for 02/28/2016 Arrival Information Details Patient Name: [name] [name], [name] Date of Service: 02/28/2016 12:45 PM Medical Record Patient Account Number: [id number] Number: Treating RN: Baruch Gouty, RN, BSN, Rita 10-16-1940 [phone] y.o. Other Clinician: Date of Birth/Sex: Female) Treating ROBSON, [place] Primary Care Physician: Margarita Rana Physician/Extender: G Referring Physician: [name] in Treatment: 12 Visit Information History Since Last Visit Added or deleted any medications: No Patient Arrived: Ambulatory Any new allergies or adverse reactions: No Arrival Time: 12:48 Had a fall or experienced change in No Accompanied By: self activities of daily living that may affect Transfer Assistance: None risk of falls: Patient Identification Verified: Yes Signs or symptoms of abuse/neglect since last No Secondary Verification Process Yes visito Completed: Hospitalized since last visit: No Patient Requires Transmission-Based No Has Dressing in Place as Prescribed: Yes Precautions: Has Compression in Place as Prescribed: Yes Patient Has Alerts: No Pain Present Now: No Electronic Signature(s) Signed: 02/28/2016 5:42:05 PM By: Regan Lemming BSN, RN Entered By: Regan Lemming on 02/28/2016 12:48:41 Tomaselli, Latona L. ([id number]) -------------------------------------------------------------------------------- Clinic Level of Care Assessment Details Patient Name: [name], [name] Date of Service: 02/28/2016 12:45 PM Medical Record Patient Account Number: [id number] Number: Treating RN: Baruch Gouty, RN, BSN, Rita [birth year] [phone] y.o. Other Clinician: Date of Birth/Sex: Female) Treating ROBSON, [place] Primary Care Physician: Margarita Rana Physician/Extender: G Referring Physician: [name] in Treatment: 12 Clinic Level of Care Assessment Items TOOL 4 Quantity Score []  - Use when only an EandM is performed on FOLLOW-UP visit  0 ASSESSMENTS - Nursing Assessment / Reassessment X - Reassessment of Co-morbidities (includes updates in patient status) 1 10 X - Reassessment of Adherence to Treatment Plan 1 5 ASSESSMENTS - Wound and Skin Assessment / Reassessment X - Simple Wound Assessment / Reassessment - one wound 1 5 []  - Complex Wound Assessment / Reassessment - multiple wounds 0 []  - Dermatologic / Skin Assessment (not related to wound area) 0 ASSESSMENTS - Focused Assessment []  - Circumferential Edema Measurements - multi extremities 0 []  - Nutritional Assessment / Counseling / Intervention 0 X - Lower Extremity Assessment (monofilament, tuning fork, pulses) 1 5 []  - Peripheral Arterial Disease Assessment (using hand held doppler) 0 ASSESSMENTS - Ostomy and/or Continence Assessment and Care []  - Incontinence Assessment and Management 0 []  - Ostomy Care Assessment and Management (repouching, etc.) 0 PROCESS - Coordination of Care X - Simple Patient / Family Education for ongoing care 1 15 []  - Complex (extensive) Patient / Family Education for ongoing care 0 []  - Staff obtains Programmer, systems, Records, Test Results / Process Orders 0 []  - Staff telephones HHA, Nursing Homes / Clarify orders / etc 0 Goatley, Shamarie L. ([id number]) []  - Routine Transfer to another Facility (non-emergent condition) 0 []  - Routine Hospital Admission (non-emergent condition) 0 []  - New Admissions / Biomedical engineer / Ordering NPWT, Apligraf, etc. 0 []  - Emergency Hospital Admission (emergent condition) 0 []  - Simple Discharge Coordination 0 []  - Complex (extensive) Discharge Coordination 0 PROCESS - Special Needs []  - Pediatric / Minor Patient Management 0 []  - Isolation Patient Management 0 []  - Hearing / Language / Visual special needs 0 []  - Assessment of Community assistance (transportation, D/C planning, etc.) 0 []  - Additional assistance / Altered mentation 0 []  - Support Surface(s) Assessment (bed, cushion, seat, etc.)  0 INTERVENTIONS - Wound Cleansing / Measurement X - Simple Wound Cleansing - one wound 1 5 []  - Complex Wound Cleansing - multiple wounds 0  X - Wound Imaging (photographs - any number of wounds) 1 5 []  - Wound Tracing (instead of photographs) 0 X - Simple Wound Measurement - one wound 1 5 []  - Complex Wound Measurement - multiple wounds 0 INTERVENTIONS - Wound Dressings X - Small Wound Dressing one or multiple wounds 1 10 []  - Medium Wound Dressing one or multiple wounds 0 []  - Large Wound Dressing one or multiple wounds 0 []  - Application of Medications - topical 0 []  - Application of Medications - injection 0 Baskerville, Hanako L. ([id number]) INTERVENTIONS - Miscellaneous []  - External ear exam 0 []  - Specimen Collection (cultures, biopsies, blood, body fluids, etc.) 0 []  - Specimen(s) / Culture(s) sent or taken to Lab for analysis 0 []  - Patient Transfer (multiple staff / Harrel Lemon Lift / Similar devices) 0 []  - Simple Staple / Suture removal (25 or less) 0 []  - Complex Staple / Suture removal (26 or more) 0 []  - Hypo / Hyperglycemic Management (close monitor of Blood Glucose) 0 []  - Ankle / Brachial Index (ABI) - do not check if billed separately 0 X - Vital Signs 1 5 Has the patient been seen at the hospital within the last three years: Yes Total Score: 70 Level Of Care: New/Established - Level 2 Electronic Signature(s) Signed: 02/28/2016 5:42:05 PM By: Regan Lemming BSN, RN Entered By: Regan Lemming on 02/28/2016 13:12:31 Dolson, Emil L. ([id number]) -------------------------------------------------------------------------------- Encounter Discharge Information Details Patient Name: [name], [name] Date of Service: 02/28/2016 12:45 PM Medical Record Patient Account Number: [id number] Number: Treating RN: Baruch Gouty, RN, BSN, Rita [birth year] [phone] y.o. Other Clinician: Date of Birth/Sex: Female) Treating ROBSON, MICHAEL Primary Care Physician: Margarita Rana Physician/Extender:  G Referring Physician: [name] in Treatment: 12 Encounter Discharge Information Items Discharge Pain Level: 0 Discharge Condition: Stable Ambulatory Status: Ambulatory Discharge Destination: Home Transportation: Private Auto Accompanied By: self Schedule Follow-up Appointment: No Medication Reconciliation completed and provided to Patient/Care No Teri Diltz: Provided on Clinical Summary of Care: 02/28/2016 Form Type Recipient Paper Patient NB Electronic Signature(s) Signed: 02/28/2016 1:29:12 PM By: Regan Lemming BSN, RN Previous Signature: 02/28/2016 1:13:31 PM Version By: Ruthine Dose Entered By: Regan Lemming on 02/28/2016 13:29:12 Querry, Ikia L. ([id number]) -------------------------------------------------------------------------------- Lower Extremity Assessment Details Patient Name: Beaumier, Latisha L. Date of Service: 02/28/2016 12:45 PM Medical Record Patient Account Number: [id number] Number: Treating RN: Baruch Gouty, RN, BSN, Rita 08/12/1941 [phone] y.o. Other Clinician: Date of Birth/Sex: Female) Treating ROBSON, MICHAEL Primary Care Physician: Margarita Rana Physician/Extender: G Referring Physician: [name] in Treatment: 12 Vascular Assessment Pulses: Posterior Tibial Dorsalis Pedis Palpable: [Right:Yes] Extremity colors, hair growth, and conditions: Extremity Color: [Right:Mottled] Hair Growth on Extremity: [Right:No] Temperature of Extremity: [Right:Warm] Capillary Refill: [Right:< 3 seconds] Toe Nail Assessment Left: Right: Thick: No Discolored: No Deformed: No Improper Length and Hygiene: No Electronic Signature(s) Signed: 02/28/2016 5:42:05 PM By: Regan Lemming BSN, RN Entered By: Regan Lemming on 02/28/2016 12:51:46 Patchin, Soquel ([id number]) -------------------------------------------------------------------------------- Multi Wound Chart Details Patient Name: Tatro, Tanecia L. Date of Service: 02/28/2016 12:45 PM Medical Record  Patient Account Number: [id number] Number: Treating RN: Baruch Gouty, RN, BSN, Rita [birth year] [phone] y.o. Other Clinician: Date of Birth/Sex: Female) Treating ROBSON, [place] Primary Care Physician: Margarita Rana Physician/Extender: G Referring Physician: [name] in Treatment: 12 Vital Signs Height(in): 65 Pulse(bpm): 72 Weight(lbs): 134 Blood Pressure 112/38 (mmHg): Body Mass Index(BMI): 22 Temperature(F): 98.2 Respiratory Rate 16 (breaths/min): Photos: [1:No Photos] [N/A:N/A] Wound Location: [1:Right Lower Leg - Lateral] [N/A:N/A] Wounding Event: [  1:Trauma] [N/A:N/A] Primary Etiology: [1:Skin Tear] [N/A:N/A] Comorbid History: [1:Cataracts, Chronic Obstructive Pulmonary Disease (COPD), Hypertension, Osteoarthritis, Neuropathy, Confinement Anxiety] [N/A:N/A] Date Acquired: [1:10/11/2015] [N/A:N/A] Weeks of Treatment: [1:12] [N/A:N/A] Wound Status: [1:Open] [N/A:N/A] Measurements L x W x D 0.1x0.1x0.4 [N/A:N/A] (cm) Area (cm) : [1:0.008] [N/A:N/A] Volume (cm) : [1:0.003] [N/A:N/A] % Reduction in Area: [1:99.70%] [N/A:N/A] % Reduction in Volume: 99.00% [N/A:N/A] Classification: [1:Full Thickness Without Exposed Support Structures] [N/A:N/A] Exudate Amount: [1:Small] [N/A:N/A] Exudate Type: [1:Serosanguineous] [N/A:N/A] Exudate Color: [1:red, brown] [N/A:N/A] Wound Margin: [1:Distinct, outline attached] [N/A:N/A] Granulation Amount: [1:Large (67-100%)] [N/A:N/A] Granulation Quality: Pink, Pale N/A N/A Necrotic Amount: None Present (0%) N/A N/A Exposed Structures: Fascia: No N/A N/A Fat: No Tendon: No Muscle: No Joint: No Bone: No Limited to Skin Breakdown Epithelialization: Large (67-100%) N/A N/A Periwound Skin Texture: Edema: Yes N/A N/A Excoriation: No Induration: No Callus: No Crepitus: No Fluctuance: No Friable: No Rash: No Scarring: No Periwound Skin Moist: Yes N/A N/A Moisture: Maceration: No Dry/Scaly: No Periwound Skin Color:  Atrophie Blanche: No N/A N/A Cyanosis: No Ecchymosis: No Erythema: No Hemosiderin Staining: No Mottled: No Pallor: No Rubor: No Temperature: No Abnormality N/A N/A Tenderness on Yes N/A N/A Palpation: Wound Preparation: Ulcer Cleansing: N/A N/A Rinsed/Irrigated with Saline Topical Anesthetic Applied: Other: lidocaine 4% Treatment Notes Electronic Signature(s) Signed: 02/28/2016 5:42:05 PM By: Regan Lemming BSN, RN Entered By: Regan Lemming on 02/28/2016 13:04:29 Wendling, Akeiba Carlean Jews ([id number]) -------------------------------------------------------------------------------- [place] Details Patient Name: Viernes, Captola L. Date of Service: 02/28/2016 12:45 PM Medical Record Patient Account Number: [id number] Number: Treating RN: Baruch Gouty, RN, BSN, Rita 03/21/1941 [phone] y.o. Other Clinician: Date of Birth/Sex: Female) Treating ROBSON, MICHAEL Primary Care Physician: Margarita Rana Physician/Extender: G Referring Physician: [name] in Treatment: 12 Active Inactive Orientation to the Wound Care Program Nursing Diagnoses: Knowledge deficit related to the wound healing center program Goals: Patient/caregiver will verbalize understanding of the [place] Program Date Initiated: 12/06/2015 Goal Status: Active Interventions: Provide education on orientation to the wound center Notes: Venous Leg Ulcer Nursing Diagnoses: Knowledge deficit related to disease process and management Potential for venous Insuffiency (use before diagnosis confirmed) Goals: Non-invasive venous studies are completed as ordered Date Initiated: 12/06/2015 Goal Status: Active Patient will maintain optimal edema control Date Initiated: 12/06/2015 Goal Status: Active Patient/caregiver will verbalize understanding of disease process and disease management Date Initiated: 12/06/2015 Goal Status: Active Verify adequate tissue perfusion prior to therapeutic compression  application Date Initiated: 12/06/2015 Goal Status: Active Makarewicz, Bobbette L. ([id number]) Interventions: Assess peripheral edema status every visit. Compression as ordered Provide education on venous insufficiency Treatment Activities: Non-invasive vascular studies : 12/13/2015 Therapeutic compression applied : 12/13/2015 Notes: Wound/Skin Impairment Nursing Diagnoses: Impaired tissue integrity Knowledge deficit related to smoking impact on wound healing Knowledge deficit related to ulceration/compromised skin integrity Goals: Patient/caregiver will verbalize understanding of skin care regimen Date Initiated: 12/06/2015 Goal Status: Active Ulcer/skin breakdown will have a volume reduction of 30% by week 4 Date Initiated: 12/06/2015 Goal Status: Active Ulcer/skin breakdown will have a volume reduction of 50% by week 8 Date Initiated: 12/06/2015 Goal Status: Active Ulcer/skin breakdown will have a volume reduction of 80% by week 12 Date Initiated: 12/06/2015 Goal Status: Active Ulcer/skin breakdown will heal within 14 weeks Date Initiated: 12/06/2015 Goal Status: Active Interventions: Assess patient/caregiver ability to perform ulcer/skin care regimen upon admission and as needed Assess ulceration(s) every visit Provide education on ulcer and skin care Treatment Activities: Referred to DME Jahkai Yandell for dressing supplies : 12/13/2015 Skin care  regimen initiated : 12/13/2015 Topical wound management initiated : 12/13/2015 CAM, MOSCHETTO ([id number]) Notes: Electronic Signature(s) Signed: 02/28/2016 5:42:05 PM By: Regan Lemming BSN, RN Entered By: Regan Lemming on 02/28/2016 13:03:17 Molloy, Rakia L. ([id number]) -------------------------------------------------------------------------------- Pain Assessment Details Patient Name: Reesor, Akeisha L. Date of Service: 02/28/2016 12:45 PM Medical Record Patient Account Number: [id number] Number: Treating RN: Baruch Gouty, RN, BSN, Rita 03/25/1941  [phone] y.o. Other Clinician: Date of Birth/Sex: Female) Treating ROBSON, MICHAEL Primary Care Physician: Margarita Rana Physician/Extender: G Referring Physician: [name] in Treatment: 12 Active Problems Location of Pain Severity and Description of Pain Patient Has Paino No Site Locations With Dressing Change: No Pain Management and Medication Current Pain Management: Electronic Signature(s) Signed: 02/28/2016 5:42:05 PM By: Regan Lemming BSN, RN Entered By: Regan Lemming on 02/28/2016 12:48:58 Sato, Alexanderia L. ([id number]) -------------------------------------------------------------------------------- Patient/Caregiver Education Details Patient Name: Butterfield, Shelbi L. Date of Service: 02/28/2016 12:45 PM Medical Record Patient Account Number: [id number] Number: Treating RN: Baruch Gouty, RN, BSN, Rita 01-14-1941 [phone] y.o. Other Clinician: Date of Birth/Gender: Female) Treating ROBSON, MICHAEL Primary Care Physician: Margarita Rana Physician/Extender: G Referring Physician: [name] in Treatment: 12 Education Assessment Education Provided To: Patient Education Topics Provided Venous: Methods: Explain/Verbal Responses: State content correctly Welcome To [place]: Methods: Explain/Verbal Responses: State content correctly Wound/Skin Impairment: Methods: Explain/Verbal Responses: State content correctly Electronic Signature(s) Signed: 02/28/2016 5:42:05 PM By: Regan Lemming BSN, RN Entered By: Regan Lemming on 02/28/2016 13:29:38 Vanderford, Arnette L. ([id number]) -------------------------------------------------------------------------------- Wound Assessment Details Patient Name: Prentiss, Faelynn L. Date of Service: 02/28/2016 12:45 PM Medical Record Patient Account Number: [id number] Number: Treating RN: Baruch Gouty, RN, BSN, Rita [birth year] [phone] y.o. Other Clinician: Date of Birth/Sex: Female) Treating ROBSON, MICHAEL Primary Care Physician:  Margarita Rana Physician/Extender: G Referring Physician: [name] in Treatment: 12 Wound Status Wound Number: 1 Primary Skin Tear Etiology: Wound Location: Right Lower Leg - Lateral Wound Open Wounding Event: Trauma Status: Date Acquired: 10/11/2015 Comorbid Cataracts, Chronic Obstructive Weeks Of Treatment: 12 History: Pulmonary Disease (COPD), Clustered Wound: No Hypertension, Osteoarthritis, Neuropathy, Confinement Anxiety Photos Photo Uploaded By: Regan Lemming on 02/28/2016 17:28:32 Wound Measurements Length: (cm) 0.1 Width: (cm) 0.1 Depth: (cm) 0.4 Area: (cm) 0.008 Volume: (cm) 0.003 % Reduction in Area: 99.7% % Reduction in Volume: 99% Epithelialization: Large (67-100%) Tunneling: No Undermining: No Wound Description Full Thickness Without Exposed Classification: Support Structures Wound Margin: Distinct, outline attached Exudate Small Amount: Exudate Type: Serosanguineous Exudate Color: red, brown Strehlow, Mckenze L. ([id number]) Foul Odor After Cleansing: No Wound Bed Granulation Amount: Large (67-100%) Exposed Structure Granulation Quality: Pink, Pale Fascia Exposed: No Necrotic Amount: None Present (0%) Fat Layer Exposed: No Tendon Exposed: No Muscle Exposed: No Joint Exposed: No Bone Exposed: No Limited to Skin Breakdown Periwound Skin Texture Texture Color No Abnormalities Noted: No No Abnormalities Noted: No Callus: No Atrophie Blanche: No Crepitus: No Cyanosis: No Excoriation: No Ecchymosis: No Fluctuance: No Erythema: No Friable: No Hemosiderin Staining: No Induration: No Mottled: No Localized Edema: Yes Pallor: No Rash: No Rubor: No Scarring: No Temperature / Pain Moisture Temperature: No Abnormality No Abnormalities Noted: No Tenderness on Palpation: Yes Dry / Scaly: No Maceration: No Moist: Yes Wound Preparation Ulcer Cleansing: Rinsed/Irrigated with Saline Topical Anesthetic Applied: Other: lidocaine  4%, Treatment Notes Wound #1 (Right, Lateral Lower Leg) 1. Cleansed with: Clean wound with Normal Saline 4. Dressing Applied: Iodosorb Ointment 5. Secondary Dressing Applied Non-Adherent pad 7. Secured with Other (specify in notes) Notes light coban Electronic  Signature(s) MACKINLEE, HALDERMAN ([id number]) Signed: 02/28/2016 5:42:05 PM By: Regan Lemming BSN, RN Entered By: Regan Lemming on 02/28/2016 13:03:12 Radice, Kana L. ([id number]) -------------------------------------------------------------------------------- Wound Assessment Details Patient Name: Kane, Dellamae L. Date of Service: 02/28/2016 12:45 PM Medical Record Patient Account Number: [id number] Number: Treating RN: Baruch Gouty, RN, BSN, Rita July 17, 1941 [phone] y.o. Other Clinician: Date of Birth/Sex: Female) Treating ROBSON, MICHAEL Primary Care Physician: Margarita Rana Physician/Extender: G Referring Physician: [name] in Treatment: 12 Wound Status Wound Number: 3 Primary Skin Tear Etiology: Wound Location: Right Malleolus - Lateral, Distal Wound Healed - Epithelialized Wounding Event: Blister Status: Date Acquired: 02/15/2016 Comorbid Cataracts, Chronic Obstructive Weeks Of Treatment: 0 History: Pulmonary Disease (COPD), Clustered Wound: No Hypertension, Osteoarthritis, Neuropathy, Confinement Anxiety Photos Photo Uploaded By: Regan Lemming on 02/28/2016 17:28:32 Wound Measurements Length: (cm) 0 % Reducti Width: (cm) 0 % Reducti Depth: (cm) 0 Epithelia Area: (cm) 0 Tunnelin Volume: (cm) 0 Undermin on in Area: on in Volume: lization: Large (67-100%) g: No ing: No Wound Description Classification: Partial Thickness Exudate Amount: None Present Foul Odor After Cleansing: No Wound Bed Granulation Amount: None Present (0%) Exposed Structure Necrotic Amount: None Present (0%) Fascia Exposed: No Fat Layer Exposed: No Tendon Exposed: No Kinn, Leylani L. ([id number]) Muscle Exposed: No Joint  Exposed: No Bone Exposed: No Limited to Skin Breakdown Periwound Skin Texture Texture Color No Abnormalities Noted: No No Abnormalities Noted: No Callus: No Atrophie Blanche: No Crepitus: No Cyanosis: No Excoriation: No Ecchymosis: No Fluctuance: No Erythema: No Friable: No Hemosiderin Staining: No Induration: No Mottled: No Localized Edema: No Pallor: No Rash: No Rubor: No Scarring: No Temperature / Pain Moisture Temperature: No Abnormality No Abnormalities Noted: No Dry / Scaly: Yes Maceration: No Moist: No Wound Preparation Ulcer Cleansing: Other Topical Anesthetic Applied: None Electronic Signature(s) Signed: 02/28/2016 5:42:05 PM By: Regan Lemming BSN, RN Entered By: Regan Lemming on 02/28/2016 13:25:21 Hutchinson, Hildegard L. ([id number]) -------------------------------------------------------------------------------- Wound Assessment Details Patient Name: Eckstein, Corissa L. Date of Service: 02/28/2016 12:45 PM Medical Record Patient Account Number: [id number] Number: Treating RN: Baruch Gouty, RN, BSN, Rita [birth year] [phone] y.o. Other Clinician: Date of Birth/Sex: Female) Treating ROBSON, MICHAEL Primary Care Physician: Margarita Rana Physician/Extender: G Referring Physician: [name] in Treatment: 12 Wound Status Wound Number: 3 Primary Skin Tear Etiology: Wound Location: Right Lower Leg - Lateral, Distal Wound Healed - Epithelialized Status: Wounding Event: Blister Comorbid Cataracts, Chronic Obstructive Date Acquired: 02/15/2016 History: Pulmonary Disease (COPD), Weeks Of Treatment: 1 Hypertension, Osteoarthritis, Clustered Wound: No Neuropathy, Confinement Anxiety Photos Photo Uploaded By: Regan Lemming on 02/28/2016 17:28:33 Wound Measurements Length: (cm) 0 % Reduction Width: (cm) 0 % Reduction Depth: (cm) 0 Epithelializ Area: (cm) 0 Tunneling: Volume: (cm) 0 Undermining in Area: 100% in Volume: 100% ation: Large (67-100%) No :  No Wound Description Full Thickness Without Exposed Classification: Support Structures Wound Margin: Distinct, outline attached Exudate None Present Amount: Foul Odor After Cleansing: No Wound Bed Granulation Amount: None Present (0%) Exposed Structure Mauney, Aliceson L. ([id number]) Necrotic Amount: None Present (0%) Fascia Exposed: No Fat Layer Exposed: No Tendon Exposed: No Muscle Exposed: No Joint Exposed: No Bone Exposed: No Limited to Skin Breakdown Periwound Skin Texture Texture Color No Abnormalities Noted: No No Abnormalities Noted: No Callus: No Atrophie Blanche: No Crepitus: No Cyanosis: No Excoriation: No Ecchymosis: No Fluctuance: No Erythema: No Friable: No Hemosiderin Staining: No Induration: No Mottled: No Localized Edema: Yes Pallor: No Rash: No Rubor: No Scarring: No Temperature / Pain Moisture Temperature: No  Abnormality No Abnormalities Noted: No Dry / Scaly: Yes Maceration: No Moist: No Wound Preparation Ulcer Cleansing: Other Topical Anesthetic Applied: None Electronic Signature(s) Signed: 02/28/2016 5:42:05 PM By: Regan Lemming BSN, RN Entered By: Regan Lemming on 02/28/2016 13:25:42 Comrie, Rennie L. ([id number]) -------------------------------------------------------------------------------- Vitals Details Patient Name: Ciaramitaro, Shalondra L. Date of Service: 02/28/2016 12:45 PM Medical Record Patient Account Number: [id number] Number: Treating RN: Baruch Gouty, RN, BSN, Rita 24-Oct-1940 [phone] y.o. Other Clinician: Date of Birth/Sex: Female) Treating ROBSON, MICHAEL Primary Care Physician: Margarita Rana Physician/Extender: G Referring Physician: [name] in Treatment: 12 Vital Signs Time Taken: 12:49 Temperature (F): 98.2 Height (in): 65 Pulse (bpm): 72 Weight (lbs): 134 Respiratory Rate (breaths/min): 16 Body Mass Index (BMI): 22.3 Blood Pressure (mmHg): 112/38 Reference Range: 80 - 120 mg / dl Electronic  Signature(s) Signed: 02/28/2016 5:42:05 PM By: Regan Lemming BSN, RN Entered By: Regan Lemming on 02/28/2016 12:51:32

## 2016-03-13 ENCOUNTER — Other Ambulatory Visit
Admission: RE | Admit: 2016-03-13 | Discharge: 2016-03-13 | Disposition: A | Discharge status: Home / Self Care | Payer: PPO | Source: Ambulatory Visit | Attending: Internal Medicine | Admitting: Internal Medicine

## 2016-03-13 ENCOUNTER — Encounter: Payer: PPO | Admitting: Internal Medicine

## 2016-03-13 DIAGNOSIS — X58XXXA Exposure to other specified factors, initial encounter: Secondary | ICD-10-CM | POA: Diagnosis not present

## 2016-03-13 DIAGNOSIS — S81801A Unspecified open wound, right lower leg, initial encounter: Secondary | ICD-10-CM | POA: Diagnosis not present

## 2016-03-13 DIAGNOSIS — L97213 Non-pressure chronic ulcer of right calf with necrosis of muscle: Secondary | ICD-10-CM | POA: Diagnosis not present

## 2016-03-13 NOTE — Progress Notes (Addendum)
[name] [name], PINCOCK ([id number]) Visit Report for 03/13/2016 Arrival Information Details Patient Name: [name] [name], [name] Date of Service: 03/13/2016 11:00 AM Medical Record Patient Account Number: [id number] Number: Treating RN: Baruch Gouty, RN, BSN, Rita [birth year] [phone] y.o. Other Clinician: Date of Birth/Sex: Female) Treating ROBSON, [name] [name] Primary Care Physician: Margarita Rana Physician/Extender: G Referring Physician: [name] in Treatment: 14 Visit Information History Since Last Visit Added or deleted any medications: No Patient Arrived: Ambulatory Any new allergies or adverse reactions: No Arrival Time: 10:56 Had a fall or experienced change in No Accompanied By: self activities of daily living that may affect Transfer Assistance: None risk of falls: Patient Identification Verified: Yes Signs or symptoms of abuse/neglect since last No Secondary Verification Process Yes visito Completed: Hospitalized since last visit: No Patient Requires Transmission-Based No Has Dressing in Place as Prescribed: Yes Precautions: Pain Present Now: No Patient Has Alerts: No Electronic Signature(s) Signed: 03/13/2016 10:57:15 AM By: Regan Lemming BSN, RN Entered By: Regan Lemming on 03/13/2016 10:57:15 Roundtree, [name] -------------------------------------------------------------------------------- Clinic Level of Care Assessment Details Patient Name: [name] [name], [name] Date of Service: 03/13/2016 11:00 AM Medical Record Patient Account Number: [id number] Number: Treating RN: Baruch Gouty, RN, BSN, Rita 1941/03/03 [phone] y.o. Other Clinician: Date of Birth/Sex: Female) Treating ROBSON, [name] [name] Primary Care Physician: Margarita Rana Physician/Extender: G Referring Physician: [name] in Treatment: 14 Clinic Level of Care Assessment Items TOOL 4 Quantity Score []  - Use when only an EandM is performed on FOLLOW-UP visit 0 ASSESSMENTS - Nursing Assessment /  Reassessment X - Reassessment of Co-morbidities (includes updates in patient status) 1 10 X - Reassessment of Adherence to Treatment Plan 1 5 ASSESSMENTS - Wound and Skin Assessment / Reassessment X - Simple Wound Assessment / Reassessment - one wound 1 5 []  - Complex Wound Assessment / Reassessment - multiple wounds 0 []  - Dermatologic / Skin Assessment (not related to wound area) 0 ASSESSMENTS - Focused Assessment []  - Circumferential Edema Measurements - multi extremities 0 []  - Nutritional Assessment / Counseling / Intervention 0 X - Lower Extremity Assessment (monofilament, tuning fork, pulses) 1 5 []  - Peripheral Arterial Disease Assessment (using hand held doppler) 0 ASSESSMENTS - Ostomy and/or Continence Assessment and Care []  - Incontinence Assessment and Management 0 []  - Ostomy Care Assessment and Management (repouching, etc.) 0 PROCESS - Coordination of Care X - Simple Patient / Family Education for ongoing care 1 15 []  - Complex (extensive) Patient / Family Education for ongoing care 0 []  - Staff obtains Programmer, systems, Records, Test Results / Process Orders 0 []  - Staff telephones HHA, Nursing Homes / Clarify orders / etc 0 Repetto, [name] []  - Routine Transfer to another Facility (non-emergent condition) 0 []  - Routine Hospital Admission (non-emergent condition) 0 []  - New Admissions / Biomedical engineer / Ordering NPWT, Apligraf, etc. 0 []  - Emergency Hospital Admission (emergent condition) 0 []  - Simple Discharge Coordination 0 []  - Complex (extensive) Discharge Coordination 0 PROCESS - Special Needs []  - Pediatric / Minor Patient Management 0 []  - Isolation Patient Management 0 []  - Hearing / Language / Visual special needs 0 []  - Assessment of Community assistance (transportation, D/C planning, etc.) 0 []  - Additional assistance / Altered mentation 0 []  - Support Surface(s) Assessment (bed, cushion, seat, etc.) 0 INTERVENTIONS - Wound Cleansing /  Measurement X - Simple Wound Cleansing - one wound 1 5 []  - Complex Wound Cleansing - multiple wounds 0 X - Wound Imaging (photographs - any  number of wounds) 1 5 []  - Wound Tracing (instead of photographs) 0 X - Simple Wound Measurement - one wound 1 5 []  - Complex Wound Measurement - multiple wounds 0 INTERVENTIONS - Wound Dressings X - Small Wound Dressing one or multiple wounds 1 10 []  - Medium Wound Dressing one or multiple wounds 0 []  - Large Wound Dressing one or multiple wounds 0 []  - Application of Medications - topical 0 []  - Application of Medications - injection 0 [name] [name], [name] INTERVENTIONS - Miscellaneous []  - External ear exam 0 []  - Specimen Collection (cultures, biopsies, blood, body fluids, etc.) 0 []  - Specimen(s) / Culture(s) sent or taken to Lab for analysis 0 []  - Patient Transfer (multiple staff / Harrel Lemon Lift / Similar devices) 0 []  - Simple Staple / Suture removal (25 or less) 0 []  - Complex Staple / Suture removal (26 or more) 0 []  - Hypo / Hyperglycemic Management (close monitor of Blood Glucose) 0 []  - Ankle / Brachial Index (ABI) - do not check if billed separately 0 X - Vital Signs 1 5 Has the patient been seen at the hospital within the last three years: Yes Total Score: 70 Level Of Care: New/Established - Level 2 Electronic Signature(s) Signed: 03/15/2016 2:54:06 PM By: Regan Lemming BSN, RN Entered By: Regan Lemming on 03/14/2016 17:24:22 Gleaves, [name] -------------------------------------------------------------------------------- Encounter Discharge Information Details Patient Name: [name] [name], [name] Date of Service: 03/13/2016 11:00 AM Medical Record Patient Account Number: [id number] Number: Treating RN: Baruch Gouty, RN, BSN, Rita 1940/11/13 [phone] y.o. Other Clinician: Date of Birth/Sex: Female) Treating [name] [name] Primary Care Physician: Margarita Rana Physician/Extender: G Referring Physician: [name] in Treatment: 14 Encounter Discharge Information Items Discharge Pain Level: 0 Discharge Condition: Stable Ambulatory Status: Ambulatory Discharge Destination: Home Transportation: Private Auto Accompanied By: self Schedule Follow-up Appointment: No Medication Reconciliation completed and provided to Patient/Care No Densil Ottey: Provided on Clinical Summary of Care: 03/13/2016 Form Type Recipient Paper Patient NB Electronic Signature(s) Signed: 03/14/2016 5:26:14 PM By: Regan Lemming BSN, RN Previous Signature: 03/13/2016 11:22:07 AM Version By: Ruthine Dose Entered By: Regan Lemming on 03/14/2016 17:26:14 [name] [name], [name] -------------------------------------------------------------------------------- Lower Extremity Assessment Details Patient Name: [name] [name], [name] Date of Service: 03/13/2016 11:00 AM Medical Record Patient Account Number: [id number] Number: Treating RN: Baruch Gouty, RN, BSN, Rita 1940/09/14 [phone] y.o. Other Clinician: Date of Birth/Sex: Female) Treating ROBSON, [place] Primary Care Physician: Margarita Rana Physician/Extender: G Referring Physician: [name] in Treatment: 14 Edema Assessment Assessed: [Left: No] [Right: No] Edema: [Left: N] [Right: o] Vascular Assessment Pulses: Posterior Tibial Dorsalis Pedis Palpable: [Right:Yes] Extremity colors, hair growth, and conditions: Extremity Color: [Right:Normal] Hair Growth on Extremity: [Right:No] Temperature of Extremity: [Right:Warm] Capillary Refill: [Right:< 3 seconds] Toe Nail Assessment Left: Right: Thick: No Discolored: No Deformed: No Improper Length and Hygiene: No Electronic Signature(s) Signed: 03/13/2016 10:57:49 AM By: Regan Lemming BSN, RN Entered By: Regan Lemming on 03/13/2016 10:57:49 Hurd, [place] ([id number]) -------------------------------------------------------------------------------- Multi Wound Chart Details Patient Name: [name] [name], [name]  L. Date of Service: 03/13/2016 11:00 AM Medical Record Patient Account Number: [id number] Number: Treating RN: Baruch Gouty, RN, BSN, Rita [birth year] [phone] y.o. Other Clinician: Date of Birth/Sex: Female) Treating ROBSON, [place] Primary Care Physician: Margarita Rana Physician/Extender: G Referring Physician: [name] in Treatment: 14 Vital Signs Height(in): 65 Pulse(bpm): 64 Weight(lbs): 134 Blood Pressure 145/54 (mmHg): Body Mass Index(BMI): 22 Temperature(F): 97.8 Respiratory Rate 16 (breaths/min): Photos: [1:No Photos] [N/A:N/A] Wound Location: [1:Right Lower Leg -  Lateral] [N/A:N/A] Wounding Event: [1:Trauma] [N/A:N/A] Primary Etiology: [1:Skin Tear] [N/A:N/A] Comorbid History: [1:Cataracts, Chronic Obstructive Pulmonary Disease (COPD), Hypertension, Osteoarthritis, Neuropathy, Confinement Anxiety] [N/A:N/A] Date Acquired: [1:10/11/2015] [N/A:N/A] Weeks of Treatment: [1:14] [N/A:N/A] Wound Status: [1:Open] [N/A:N/A] Measurements L x W x D 0.6x0.5x0.4 [N/A:N/A] (cm) Area (cm) : [1:0.236] [N/A:N/A] Volume (cm) : [1:0.094] [N/A:N/A] % Reduction in Area: [1:92.50%] [N/A:N/A] % Reduction in Volume: 70.10% [N/A:N/A] Classification: [1:Full Thickness Without Exposed Support Structures] [N/A:N/A] Exudate Amount: [1:Small] [N/A:N/A] Exudate Type: [1:Serosanguineous] [N/A:N/A] Exudate Color: [1:red, brown] [N/A:N/A] Wound Margin: [1:Distinct, outline attached] [N/A:N/A] Granulation Amount: [1:Large (67-100%)] [N/A:N/A] Granulation Quality: Pink, Pale N/A N/A Necrotic Amount: None Present (0%) N/A N/A Exposed Structures: Fascia: No N/A N/A Fat: No Tendon: No Muscle: No Joint: No Bone: No Limited to Skin Breakdown Epithelialization: Large (67-100%) N/A N/A Periwound Skin Texture: Edema: Yes N/A N/A Excoriation: No Induration: No Callus: No Crepitus: No Fluctuance: No Friable: No Rash: No Scarring: No Periwound Skin Moist: Yes N/A  N/A Moisture: Maceration: No Dry/Scaly: No Periwound Skin Color: Atrophie Blanche: No N/A N/A Cyanosis: No Ecchymosis: No Erythema: No Hemosiderin Staining: No Mottled: No Pallor: No Rubor: No Temperature: No Abnormality N/A N/A Tenderness on Yes N/A N/A Palpation: Wound Preparation: Ulcer Cleansing: N/A N/A Rinsed/Irrigated with Saline Topical Anesthetic Applied: Other: lidocaine 4% Treatment Notes Electronic Signature(s) Signed: 03/13/2016 11:09:04 AM By: Regan Lemming BSN, RN Entered By: Regan Lemming on 03/13/2016 11:09:04 Mura, Vlada Carlean Jews ([id number]) -------------------------------------------------------------------------------- [place] Details Patient Name: [name] [name], [name] Date of Service: 03/13/2016 11:00 AM Medical Record Patient Account Number: [id number] Number: Treating RN: Baruch Gouty, RN, BSN, Rita 03-05-1941 [phone] y.o. Other Clinician: Date of Birth/Sex: Female) Treating [name] [name] Primary Care Physician: Margarita Rana Physician/Extender: G Referring Physician: [name] in Treatment: 14 Active Inactive Orientation to the Wound Care Program Nursing Diagnoses: Knowledge deficit related to the wound healing center program Goals: Patient/caregiver will verbalize understanding of the [facility] Program Date Initiated: 12/06/2015 Goal Status: Active Interventions: Provide education on orientation to the wound center Notes: Venous Leg Ulcer Nursing Diagnoses: Knowledge deficit related to disease process and management Potential for venous Insuffiency (use before diagnosis confirmed) Goals: Non-invasive venous studies are completed as ordered Date Initiated: 12/06/2015 Goal Status: Active Patient will maintain optimal edema control Date Initiated: 12/06/2015 Goal Status: Active Patient/caregiver will verbalize understanding of disease process and disease management Date Initiated: 12/06/2015 Goal Status:  Active Verify adequate tissue perfusion prior to therapeutic compression application Date Initiated: 12/06/2015 Goal Status: Active [name] [name], [name] Interventions: Assess peripheral edema status every visit. Compression as ordered Provide education on venous insufficiency Treatment Activities: Non-invasive vascular studies : 12/13/2015 Therapeutic compression applied : 12/13/2015 Notes: Wound/Skin Impairment Nursing Diagnoses: Impaired tissue integrity Knowledge deficit related to smoking impact on wound healing Knowledge deficit related to ulceration/compromised skin integrity Goals: Patient/caregiver will verbalize understanding of skin care regimen Date Initiated: 12/06/2015 Goal Status: Active Ulcer/skin breakdown will have a volume reduction of 30% by week 4 Date Initiated: 12/06/2015 Goal Status: Active Ulcer/skin breakdown will have a volume reduction of 50% by week 8 Date Initiated: 12/06/2015 Goal Status: Active Ulcer/skin breakdown will have a volume reduction of 80% by week 12 Date Initiated: 12/06/2015 Goal Status: Active Ulcer/skin breakdown will heal within 14 weeks Date Initiated: 12/06/2015 Goal Status: Active Interventions: Assess patient/caregiver ability to perform ulcer/skin care regimen upon admission and as needed Assess ulceration(s) every visit Provide education on ulcer and skin care Treatment Activities: Referred to DME Onalee Steinbach for dressing supplies :  12/13/2015 Skin care regimen initiated : 12/13/2015 Topical wound management initiated : 12/13/2015 CHARIE, SUBIA ([id number]) Notes: Electronic Signature(s) Signed: 03/13/2016 11:08:47 AM By: Regan Lemming BSN, RN Entered By: Regan Lemming on 03/13/2016 11:08:47 [name] [name], [name] ([id number]) -------------------------------------------------------------------------------- Pain Assessment Details Patient Name: [name] [name], [name] Date of Service: 03/13/2016 11:00 AM Medical Record Patient Account  Number: [id number] Number: Treating RN: Baruch Gouty, RN, BSN, Rita 12/27/1940 [phone] y.o. Other Clinician: Date of Birth/Sex: Female) Treating [name] [name] Primary Care Physician: Margarita Rana Physician/Extender: G Referring Physician: [name] in Treatment: 14 Active Problems Location of Pain Severity and Description of Pain Patient Has Paino No Site Locations With Dressing Change: No Pain Management and Medication Current Pain Management: Electronic Signature(s) Signed: 03/13/2016 10:57:23 AM By: Regan Lemming BSN, RN Entered By: Regan Lemming on 03/13/2016 10:57:23 [name] [name], [name] ([id number]) -------------------------------------------------------------------------------- Patient/Caregiver Education Details Patient Name: Souffrant, Lynea L. Date of Service: 03/13/2016 11:00 AM Medical Record Patient Account Number: [id number] Number: Treating RN: Baruch Gouty, RN, BSN, Rita 1940/12/30 [phone] y.o. Other Clinician: Date of Birth/Gender: Female) Treating [name] [name] Primary Care Physician: Margarita Rana Physician/Extender: G Referring Physician: [name] in Treatment: 14 Education Assessment Education Provided To: Patient Education Topics Provided Venous: Methods: Explain/Verbal Responses: State content correctly Welcome To [facility]: Methods: Explain/Verbal Responses: State content correctly Wound/Skin Impairment: Methods: Explain/Verbal Responses: State content correctly Electronic Signature(s) Signed: 03/15/2016 2:54:06 PM By: Regan Lemming BSN, RN Entered By: Regan Lemming on 03/14/2016 17:26:31 Friesenhahn, Shrika L. ([id number]) -------------------------------------------------------------------------------- Wound Assessment Details Patient Name: Yamin, Murlene L. Date of Service: 03/13/2016 11:00 AM Medical Record Patient Account Number: [id number] Number: Treating RN: Baruch Gouty, RN, BSN, Rita 11/17/1940 [phone] y.o. Other  Clinician: Date of Birth/Sex: Female) Treating [name] [name] Primary Care Physician: Margarita Rana Physician/Extender: G Referring Physician: [name] in Treatment: 14 Wound Status Wound Number: 1 Primary Skin Tear Etiology: Wound Location: Right Lower Leg - Lateral Wound Open Wounding Event: Trauma Status: Date Acquired: 10/11/2015 Comorbid Cataracts, Chronic Obstructive Weeks Of Treatment: 14 History: Pulmonary Disease (COPD), Clustered Wound: No Hypertension, Osteoarthritis, Neuropathy, Confinement Anxiety Photos Photo Uploaded By: Regan Lemming on 03/13/2016 12:01:35 Wound Measurements Length: (cm) 0.6 Width: (cm) 0.5 Depth: (cm) 0.4 Area: (cm) 0.236 Volume: (cm) 0.094 % Reduction in Area: 92.5% % Reduction in Volume: 70.1% Epithelialization: Large (67-100%) Tunneling: No Undermining: No Wound Description Full Thickness Without Exposed Classification: Support Structures Wound Margin: Distinct, outline attached Exudate Small Amount: Exudate Type: Serosanguineous Exudate Color: red, brown Mex[place]ano, Aralynn L. ([id number]) Foul Odor After Cleansing: No Wound Bed Granulation Amount: Large (67-100%) Exposed Structure Granulation Quality: Pink, Pale Fascia Exposed: No Necrotic Amount: None Present (0%) Fat Layer Exposed: No Tendon Exposed: No Muscle Exposed: No Joint Exposed: No Bone Exposed: No Limited to Skin Breakdown Periwound Skin Texture Texture Color No Abnormalities Noted: No No Abnormalities Noted: No Callus: No Atrophie Blanche: No Crepitus: No Cyanosis: No Excoriation: No Ecchymosis: No Fluctuance: No Erythema: No Friable: No Hemosiderin Staining: No Induration: No Mottled: No Localized Edema: Yes Pallor: No Rash: No Rubor: No Scarring: No Temperature / Pain Moisture Temperature: No Abnormality No Abnormalities Noted: No Tenderness on Palpation: Yes Dry / Scaly: No Maceration: No Moist: Yes Wound Preparation Ulcer  Cleansing: Rinsed/Irrigated with Saline Topical Anesthetic Applied: Other: lidocaine 4%, Treatment Notes Wound #1 (Right, Lateral Lower Leg) 1. Cleansed with: Clean wound with Normal Saline 4. Dressing Applied: Other dressing (specify in notes) 5. Secondary Dressing Applied Non-Adherent pad Notes Sorbact, light coban Electronic  Signature(s) Signed: 03/14/2016 3:37:03 PM By: Regan Lemming BSN, RN Entered By: Regan Lemming on 03/13/2016 11:05:45 Cryan, Shamiah L. ([id number]) Blixt, Kennedee L. ([id number]) -------------------------------------------------------------------------------- Vitals Details Patient Name: Fishbaugh, Azuri L. Date of Service: 03/13/2016 11:00 AM Medical Record Patient Account Number: [id number] Number: Treating RN: Baruch Gouty, RN, BSN, Rita [birth year] [phone] y.o. Other Clinician: Date of Birth/Sex: Female) Treating [name] [name] Primary Care Physician: Margarita Rana Physician/Extender: G Referring Physician: [name] in Treatment: 14 Vital Signs Time Taken: 11:00 Temperature (F): 97.8 Height (in): 65 Pulse (bpm): 64 Weight (lbs): 134 Respiratory Rate (breaths/min): 16 Body Mass Index (BMI): 22.3 Blood Pressure (mmHg): 145/54 Reference Range: 80 - 120 mg / dl Electronic Signature(s) Signed: 03/14/2016 3:37:03 PM By: Regan Lemming BSN, RN Entered By: Regan Lemming on 03/13/2016 11:01:23

## 2016-03-14 ENCOUNTER — Ambulatory Visit
Admission: RE | Admit: 2016-03-14 | Discharge: 2016-03-14 | Disposition: A | Discharge status: Home / Self Care | Payer: PPO | Source: Ambulatory Visit | Attending: Cardiothoracic Surgery | Admitting: Cardiothoracic Surgery

## 2016-03-14 DIAGNOSIS — D3502 Benign neoplasm of left adrenal gland: Secondary | ICD-10-CM | POA: Diagnosis not present

## 2016-03-14 DIAGNOSIS — I2510 Atherosclerotic heart disease of native coronary artery without angina pectoris: Secondary | ICD-10-CM | POA: Diagnosis not present

## 2016-03-14 DIAGNOSIS — R911 Solitary pulmonary nodule: Secondary | ICD-10-CM | POA: Diagnosis not present

## 2016-03-14 DIAGNOSIS — R918 Other nonspecific abnormal finding of lung field: Secondary | ICD-10-CM | POA: Insufficient documentation

## 2016-03-14 DIAGNOSIS — I700 Atherosclerosis of aorta: Secondary | ICD-10-CM | POA: Diagnosis not present

## 2016-03-14 LAB — POCT I-STAT CREATININE: CREATININE: 0.8 mg/dL (ref 0.44–1.00)

## 2016-03-14 MED ORDER — IOPAMIDOL (ISOVUE-300) INJECTION 61%
75.0000 mL | Freq: Once | INTRAVENOUS | Status: AC | PRN
Start: 2016-03-14 — End: 2016-03-14
  Administered 2016-03-14: 75 mL via INTRAVENOUS

## 2016-03-15 NOTE — Progress Notes (Signed)
[name] Visit Report for 03/13/2016 Chief Complaint Document Details Patient Name: [name] [name], [name] Date of Service: 03/13/2016 11:00 AM Medical Record Patient Account Number: [id number] Number: Treating RN: Baruch Gouty, RN, BSN, Rita 07/12/1941 [id number] y.o. Other Clinician: Date of Birth/Sex: Female) Treating ROBSON, MICHAEL Primary Care Physician/Extender: Lonn Georgia Physician: Referring Physician: [name] in Treatment: 14 Information Obtained from: Patient Chief Complaint Patient is here for a chronic wound on her right anterior lateral leg Electronic Signature(s) Signed: 03/13/2016 4:29:49 PM By: Linton Ham MD Entered By: Linton Ham on 03/13/2016 11:37:02 [name] [name], [name] -------------------------------------------------------------------------------- HPI Details Patient Name: [name] Date of Service: 03/13/2016 11:00 AM Medical Record Patient Account Number: [id number] Number: Treating RN: Baruch Gouty, RN, BSN, Rita Jul 04, 1941 [phone] y.o. Other Clinician: Date of Birth/Sex: Female) Treating ROBSON, MICHAEL Primary Care Physician/Extender: Lonn Georgia Physician: Referring Physician: [name] in Treatment: 14 History of Present Illness HPI Description: 12/06/15; this is a patient who has no prior wound history and is not a diabetic. In February she was getting into her family truck and the wind pushed the door against the outer aspect of her right lower leg. I believe there was initially some swelling but not clearly a hematoma. 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 Has been using topical antibiotics on this. 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 There is already been some improvement. She has no prior history of PAD and no major history of venous insufficiency. 12/13/15; wound appears healthy, using prisma 12/20/15; patient arrives complaining of  increasing pain around the wound. She also had a blister under the wound. 12/26/15; the patient arrives with the blister last week fully excised and healed however she has another floppy flaccid blister just below her wound. The wound does not appear to be infected. The cause of this is not really clear, he does walk 3 miles a day but doesn't think that the area with a blister is currently rubs the bandage at all. She said she felt a stinging and then noticed this on the weekend. She will complete the antibiotics I gave hertomorrow 01/02/16; the patient's original wound in the right lower leg requires debridement of surface slough and surrounding eschar. The base of this then looks healthy. The area that was a blister underneath this of uncertain etiology also appears to be better. Culture of this blister from last week was negative 01/09/16; the patient's original wound on the lower right lateral calf requires debridement of surface slough and surrounding eschar. Base of this looks healthy once again. She has another recurrent blister underneath this wound. This is his second one of these. I cultured the last one which was negative. The patient is very active and I wonder whether this has something to do with this there is no evidence of surrounding cellulitis 01/16/16; the patient's original wound on the lower right calf again requires debridement. Her subsequent wound below this which was a blister last week also has a fibrinous surface slough that requires debridement. Again the etiology of these wounds isn't completely clear to me. She does not have PAD, her ABI in this clinic was 1.17 on the right. She may have mild venous reflux but certainly does not have frank venous inflammation. She is very active telling me she walked 5 miles this weekend but states that she did not have anything that showed of caused pressure friction on the wound area.  01/23/16 both the patient's wounds appear to be  improved. Her subsequent wound which was a blister also appears to be smaller. 01/30/16; the inferior wound which was a secondary wound has resolved. Her original wound appears better. Still using Iodoflex 02/06/16; surprisingly the patient's wound is almost totally closed over except for a small probing, I'll on the medial aspect of the circumference of the wound. The issue here is she has never had tunneling in this, most of this was always superficial. This has a clear, nonpurulent, looking drainage 02/20/16: Patient arrives today stating she had an intensely puritic area just below her remaining small [name] wound. When she could no longer stand it she scratched a blister and now has a new area. There may be some swelling of the entire part of her leg ocontact dermatitis however she has not erythema OR PAIN and the wound no longer is itichy 02/28/16; everything seems to be much better except for the small deep wound part of the original area. This goes down roughly 0.5 cm. There is no subcutaneous tenderness crepitus or purulence. She has been using Iodosorb. She is traveling to [place] next week 03/13/16; the patient was not seen last week as she was traveling in [place]. I had hoped that this would respond to the Iodosorb ointment however the patient arrives today with still a whole of 0.4 cm. When I was doing my initial assessment today moderate amount of frank Green purulent material was expressed from the surface of the wound Electronic Signature(s) Signed: 03/13/2016 4:29:49 PM By: Linton Ham MD Entered By: Linton Ham on 03/13/2016 11:38:00 [name] [name], [name] [name] ([id number]) -------------------------------------------------------------------------------- Physical Exam Details Patient Name: [name] Date of Service: 03/13/2016 11:00 AM Medical Record Patient Account Number: [id number] Number: Treating RN: Baruch Gouty, RN, BSN,  Rita [birth year] [phone] y.o. Other Clinician: Date of Birth/Sex: Female) Treating ROBSON, MICHAEL Primary Care Physician/Extender: Lonn Georgia Physician: Referring Physician: [name] in Treatment: 14 Notes Wound exam; there was more of the circumference this week than I remember from 2 weeks ago. Still a deep probing hold with palpation of the soft tissue around the wound there was a moderate amount of purulent drainage obtained specimen obtained for culture Electronic Signature(s) Signed: 03/13/2016 4:29:49 PM By: Linton Ham MD Entered By: Linton Ham on 03/13/2016 11:40:03 [name] [name], [name] -------------------------------------------------------------------------------- Physician Orders Details Patient Name: [name] [name], [name] Date of Service: 03/13/2016 11:00 AM Medical Record Patient Account Number: [id number] Number: Treating RN: Baruch Gouty, RN, BSN, Rita 03-16-1941 [phone] y.o. Other Clinician: Date of Birth/Sex: Female) Treating ROBSON, MICHAEL Primary Care Physician/Extender: Lonn Georgia Physician: Referring Physician: [name] in Treatment: 14 Verbal / Phone Orders: Yes Clinician: Afful, RN, BSN, Rita Read Back and Verified: Yes Diagnosis Coding Wound Cleansing Wound #1 Right,Lateral Lower Leg o Cleanse wound with mild soap and water o May Shower, gently pat wound dry prior to applying new dressing. o May shower with protection. Anesthetic Wound #1 Right,Lateral Lower Leg o Topical Lidocaine 4% cream applied to wound bed prior to debridement Primary Wound Dressing Wound #1 Right,Lateral Lower Leg o Cutimed Sorbact Secondary Dressing Wound #1 Right,Lateral Lower Leg o Non-adherent pad Dressing Change Frequency Wound #1 Right,Lateral Lower Leg o Change dressing every other day. Follow-up Appointments Wound #1 Right,Lateral Lower Leg o Return Appointment in 1 week. Edema Control Wound #1  Right,Lateral Lower Leg o Other: - coban wrapped from ankle to mid calf Additional Orders / Instructions Wound #  1 Right,Lateral Lower Leg [name] [name], [name] o Increase protein intake. o Activity as tolerated Laboratory o Bacteria identified in Wound by Culture (MICRO) - right lower leg oooo LOINC Code: [id number] oooo Convenience Name: Wound culture routine Electronic Signature(s) Signed: 03/13/2016 4:29:49 PM By: Linton Ham MD Signed: 03/14/2016 3:37:03 PM By: Regan Lemming BSN, RN Entered By: Regan Lemming on 03/13/2016 11:15:55 [name] [name], [name] -------------------------------------------------------------------------------- Problem List Details Patient Name: Florendo, Polina [name] Date of Service: 03/13/2016 11:00 AM Medical Record Patient Account Number: [id number] Number: Treating RN: Baruch Gouty, RN, BSN, Rita 08/07/1941 [phone] y.o. 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 -------------------------------------------------------------------------------- Progress Note Details Patient Name: [name] [name], [name] Date of Service: 03/13/2016 11:00 AM Medical Record Patient Account Number: [id number] Number: Treating RN: Baruch Gouty, RN, BSN, Rita 07/08/1941 [id number] y.o. Other Clinician: Date of Birth/Sex: Female) Treating ROBSON, MICHAEL Primary Care Physician/Extender: Lonn Georgia Physician: Referring Physician: [name] in Treatment: 14 Subjective Chief Complaint Information obtained from Patient Patient is here for a chronic wound on her right anterior lateral leg History of Present Illness (HPI) 12/06/15; this is a patient who has no prior wound history and is not a diabetic. In February she was getting into her family truck and the wind pushed the door against the outer aspect of her right lower leg. I believe there was initially some swelling but not clearly a hematoma. 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 Has been using topical antibiotics on this. 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 There is already been some improvement. She has no prior history of PAD and no major history of venous insufficiency. 12/13/15; wound appears healthy, using prisma 12/20/15; patient arrives complaining of increasing pain around the wound. She also had a blister under the wound. 12/26/15; the patient arrives with the blister last week fully excised and healed however she has another floppy flaccid blister just below her wound. The wound does not appear to be infected. The cause of this is not really clear, he does walk 3 miles a day but doesn't think that the area with a blister is currently rubs the bandage at all. She said she felt a stinging and then noticed this on the weekend. She will complete the antibiotics I gave hertomorrow 01/02/16; the patient's original wound in the right lower leg requires debridement of surface slough and surrounding eschar. The base of this then looks healthy. The area that was a blister underneath this of uncertain etiology also appears to be better. Culture of this blister from last week was negative 01/09/16; the patient's original wound on the lower right lateral calf requires debridement of surface slough and surrounding eschar. Base of this looks healthy once again. She has another  recurrent blister underneath this wound. This is his second one of these. I cultured the last one which was negative. The patient is very active and I wonder whether this has something to do with this there is no evidence of surrounding cellulitis 01/16/16; the patient's original wound on the  lower right calf again requires debridement. Her subsequent wound below this which was a blister last week also has a fibrinous surface slough that requires debridement. Again the etiology of these wounds isn't completely clear to me. She does not have PAD, her ABI in this clinic was 1.17 on the right. She may have mild venous reflux but certainly does not have frank venous inflammation. She is very active telling me she walked 5 miles this weekend but states that she did not have anything that showed of caused pressure friction on the wound area. 01/23/16 both the patient's wounds appear to be improved. Her subsequent wound which was a blister also appears to be smaller. [name] [name], [name] 01/30/16; the inferior wound which was a secondary wound has resolved. Her original wound appears better. Still using Iodoflex 02/06/16; surprisingly the patient's wound is almost totally closed over except for a small probing, I'll on the medial aspect of the circumference of the wound. The issue here is she has never had tunneling in this, most of this was always superficial. This has a clear, nonpurulent, looking drainage 02/20/16: Patient arrives today stating she had an intensely puritic area just below her remaining small wound. When she could no longer stand it she scratched a blister and now has a new area. There may be some swelling of the entire part of her leg ocontact dermatitis however she has not erythema OR PAIN and the wound no longer is itichy 02/28/16; everything seems to be much better except for the small deep wound part of the original area. This goes down roughly 0.5 cm. There is no subcutaneous  tenderness crepitus or purulence. She has been using Iodosorb. She is traveling to [place] next week 03/13/16; the patient was not seen last week as she was traveling in [place]. I had hoped that this would respond to the Iodosorb ointment however the patient arrives today with still a whole of 0.4 cm. When I was doing my initial assessment today moderate amount of frank Green purulent material was expressed from the surface of the wound Objective Constitutional Vitals Time Taken: 11:00 AM, Height: 65 in, Weight: 134 lbs, BMI: 22.3, Temperature: 97.8 F, Pulse: 64 bpm, Respiratory Rate: 16 breaths/min, Blood Pressure: 145/54 mmHg. Integumentary (Hair, Skin) Wound #1 status is Open. Original cause of wound was Trauma. The wound is located on the Right,Lateral Lower Leg. The wound measures 0.6cm length x 0.5cm width x 0.4cm depth; 0.236cm^2 area and 0.094cm^3 volume. The wound is limited to skin breakdown. There is no tunneling or undermining noted. There is a small amount of serosanguineous drainage noted. The wound margin is distinct with the outline attached to the wound base. There is large (67-100%) pink, pale granulation within the wound bed. There is no necrotic tissue within the wound bed. The periwound skin appearance exhibited: Localized Edema, Moist. The periwound skin appearance did not exhibit: Callus, Crepitus, Excoriation, Fluctuance, Friable, Induration, Rash, Scarring, Dry/Scaly, Maceration, Atrophie Blanche, Cyanosis, Ecchymosis, Hemosiderin Staining, Mottled, Pallor, Rubor, Erythema. Periwound temperature was noted as No Abnormality. The periwound has tenderness on palpation. 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 I87.2 - Venous insufficiency (chronic) (peripheral) Plan Wound Cleansing: Wound #1 Right,Lateral Lower Leg: Cleanse wound with mild soap and  water May Shower, gently pat wound dry prior to applying new dressing. May shower with protection. Anesthetic: Wound #1 Right,Lateral Lower Leg: Topical Lidocaine 4% cream applied to wound  bed prior to debridement Primary Wound Dressing: Wound #1 Right,Lateral Lower Leg: Cutimed Sorbact Secondary Dressing: Wound #1 Right,Lateral Lower Leg: Non-adherent pad Dressing Change Frequency: Wound #1 Right,Lateral Lower Leg: Change dressing every other day. Follow-up Appointments: Wound #1 Right,Lateral Lower Leg: Return Appointment in 1 week. Edema Control: Wound #1 Right,Lateral Lower Leg: Other: - coban wrapped from ankle to mid calf Additional Orders / Instructions: Wound #1 Right,Lateral Lower Leg: Increase protein intake. Activity as tolerated Laboratory ordered were: Wound culture routine - right lower leg We changed her dressing to Sorbact Culture of the purulent drainage for C+S Empiric doxycycline for 7 days given [name] [name], [name] [name] ([id number]) Electronic Signature(s) Signed: 03/13/2016 4:29:49 PM By: Linton Ham MD Entered By: Linton Ham on 03/13/2016 11:41:59 [name] [name], [name] -------------------------------------------------------------------------------- SuperBill Details Patient Name: [name] [name], [name] Date of Service: 03/13/2016 Medical Record Patient Account Number: [id number] Number: Treating RN: Baruch Gouty, RN, BSN, Rita 1941-07-11 [phone] y.o. Other Clinician: Date of Birth/Sex: Female) Treating ROBSON, MICHAEL Primary Care Physician/Extender: Lonn Georgia Physician: Suella Grove in Treatment: 14 Referring Physician: Margarita Rana Diagnosis Coding ICD-10 Codes Code Description [phone] Non-pressure chronic ulcer of right calf with necrosis of muscle I87.2 Venous insufficiency (chronic) (peripheral) Facility Procedures CPT4 Code: [id number] Description: [id number] - WOUND CARE VISIT-LEV 2 EST PT Modifier: Quantity: 1 Physician Procedures CPT4  Code Description: [id number] - WC PHYS LEVEL 2 - EST PT ICD-10 Description Diagnosis L97.213 Non-pressure chronic ulcer of right calf with necro Modifier: sis of muscle Quantity: 1 Electronic Signature(s) Signed: 03/14/2016 5:24:36 PM By: Regan Lemming BSN, RN Previous Signature: 03/13/2016 4:29:49 PM Version By: Linton Ham MD Entered By: Regan Lemming on 03/14/2016 17:24:35

## 2016-03-17 LAB — AEROBIC CULTURE  (SUPERFICIAL SPECIMEN): GRAM STAIN: NONE SEEN

## 2016-03-17 LAB — AEROBIC CULTURE W GRAM STAIN (SUPERFICIAL SPECIMEN)

## 2016-03-18 ENCOUNTER — Ambulatory Visit: Payer: Self-pay | Admitting: Cardiothoracic Surgery

## 2016-03-19 ENCOUNTER — Ambulatory Visit: Payer: Self-pay | Admitting: Cardiothoracic Surgery

## 2016-03-20 ENCOUNTER — Encounter: Payer: PPO | Admitting: Internal Medicine

## 2016-03-20 DIAGNOSIS — S81801D Unspecified open wound, right lower leg, subsequent encounter: Secondary | ICD-10-CM | POA: Diagnosis not present

## 2016-03-20 DIAGNOSIS — L97213 Non-pressure chronic ulcer of right calf with necrosis of muscle: Secondary | ICD-10-CM | POA: Diagnosis not present

## 2016-03-20 NOTE — Progress Notes (Addendum)
[name], [name] ([id number]) Visit Report for 03/20/2016 Arrival Information Details Patient Name: [name] [name], [name] Date of Service: 03/20/2016 10:45 AM Medical Record Patient Account Number: [id number] Number: Treating RN: Baruch Gouty, RN, BSN, Rita 1940/12/30 [phone] y.o. Other Clinician: Date of Birth/Sex: Female) Treating ROBSON, [place] [name] Primary Care Physician: Margarita Rana Physician/Extender: G Referring Physician: Holland [name] in Treatment: 15 Visit Information History Since Last Visit All ordered tests and consults were completed: No Patient Arrived: Ambulatory Added or deleted any medications: No Arrival Time: 10:45 Any new allergies or adverse reactions: No Accompanied By: self Had a fall or experienced change in No Transfer Assistance: None activities of daily living that may affect Patient Identification Verified: Yes risk of falls: Secondary Verification Process Yes Signs or symptoms of abuse/neglect since last No Completed: visito Patient Requires Transmission-Based No Hospitalized since last visit: No Precautions: Has Dressing in Place as Prescribed: Yes Patient Has Alerts: No Pain Present Now: No Electronic Signature(s) Signed: 03/20/2016 10:46:20 AM By: Regan Lemming BSN, RN Entered By: Regan Lemming on 03/20/2016 10:46:20 [name] [name], [name] -------------------------------------------------------------------------------- Clinic Level of Care Assessment Details Patient Name: [name], [name] Date of Service: 03/20/2016 10:45 AM Medical Record Patient Account Number: [id number] Number: Treating RN: Baruch Gouty, RN, BSN, Rita [birth year] [phone] y.o. Other Clinician: Date of Birth/Sex: Female) Treating [name] [name] Primary Care Physician: Margarita Rana Physician/Extender: G Referring Physician: Holland [name] in Treatment: 15 Clinic Level of Care Assessment Items TOOL 4 Quantity Score []  - Use when only an EandM is performed on  FOLLOW-UP visit 0 ASSESSMENTS - Nursing Assessment / Reassessment X - Reassessment of Co-morbidities (includes updates in patient status) 1 10 X - Reassessment of Adherence to Treatment Plan 1 5 ASSESSMENTS - Wound and Skin Assessment / Reassessment X - Simple Wound Assessment / Reassessment - one wound 1 5 []  - Complex Wound Assessment / Reassessment - multiple wounds 0 []  - Dermatologic / Skin Assessment (not related to wound area) 0 ASSESSMENTS - Focused Assessment []  - Circumferential Edema Measurements - multi extremities 0 []  - Nutritional Assessment / Counseling / Intervention 0 X - Lower Extremity Assessment (monofilament, tuning fork, pulses) 1 5 []  - Peripheral Arterial Disease Assessment (using hand held doppler) 0 ASSESSMENTS - Ostomy and/or Continence Assessment and Care []  - Incontinence Assessment and Management 0 []  - Ostomy Care Assessment and Management (repouching, etc.) 0 PROCESS - Coordination of Care X - Simple Patient / Family Education for ongoing care 1 15 []  - Complex (extensive) Patient / Family Education for ongoing care 0 []  - Staff obtains Programmer, systems, Records, Test Results / Process Orders 0 []  - Staff telephones HHA, Nursing Homes / Clarify orders / etc 0 [name] [name], [name] []  - Routine Transfer to another Facility (non-emergent condition) 0 []  - Routine Hospital Admission (non-emergent condition) 0 []  - New Admissions / Biomedical engineer / Ordering NPWT, Apligraf, etc. 0 []  - Emergency Hospital Admission (emergent condition) 0 []  - Simple Discharge Coordination 0 []  - Complex (extensive) Discharge Coordination 0 PROCESS - Special Needs []  - Pediatric / Minor Patient Management 0 []  - Isolation Patient Management 0 []  - Hearing / Language / Visual special needs 0 []  - Assessment of Community assistance (transportation, D/C planning, etc.) 0 []  - Additional assistance / Altered mentation 0 []  - Support Surface(s) Assessment (bed, cushion,  seat, etc.) 0 INTERVENTIONS - Wound Cleansing / Measurement X - Simple Wound Cleansing - one wound 1 5 []  - Complex Wound Cleansing - multiple wounds  0 X - Wound Imaging (photographs - any number of wounds) 1 5 []  - Wound Tracing (instead of photographs) 0 X - Simple Wound Measurement - one wound 1 5 []  - Complex Wound Measurement - multiple wounds 0 INTERVENTIONS - Wound Dressings X - Small Wound Dressing one or multiple wounds 1 10 []  - Medium Wound Dressing one or multiple wounds 0 []  - Large Wound Dressing one or multiple wounds 0 []  - Application of Medications - topical 0 []  - Application of Medications - injection 0 [name] [name], [name] INTERVENTIONS - Miscellaneous []  - External ear exam 0 []  - Specimen Collection (cultures, biopsies, blood, body fluids, etc.) 0 []  - Specimen(s) / Culture(s) sent or taken to Lab for analysis 0 []  - Patient Transfer (multiple staff / Harrel Lemon Lift / Similar devices) 0 []  - Simple Staple / Suture removal (25 or less) 0 []  - Complex Staple / Suture removal (26 or more) 0 []  - Hypo / Hyperglycemic Management (close monitor of Blood Glucose) 0 []  - Ankle / Brachial Index (ABI) - do not check if billed separately 0 X - Vital Signs 1 5 Has the patient been seen at the hospital within the last three years: Yes Total Score: 70 Level Of Care: New/Established - Level 2 Electronic Signature(s) Signed: 03/20/2016 3:21:23 PM By: Regan Lemming BSN, RN Entered By: Regan Lemming on 03/20/2016 11:03:15 [name] [name], [name] -------------------------------------------------------------------------------- Encounter Discharge Information Details Patient Name: [name], [name] Date of Service: 03/20/2016 10:45 AM Medical Record Patient Account Number: [id number] Number: Treating RN: Baruch Gouty, RN, BSN, Rita July 31, 1941 [phone] y.o. Other Clinician: Date of Birth/Sex: Female) Treating ROBSON, MICHAEL Primary Care Physician: Margarita Rana Physician/Extender: G Referring Physician: Holland [name] in Treatment: 15 Encounter Discharge Information Items Discharge Pain Level: 0 Discharge Condition: Stable Ambulatory Status: Ambulatory Discharge Destination: Home Transportation: Private Auto Accompanied By: self Schedule Follow-up Appointment: No Medication Reconciliation completed and provided to Patient/Care No Damacio Weisgerber: Provided on Clinical Summary of Care: 03/20/2016 Form Type Recipient Paper Patient NB Electronic Signature(s) Signed: 03/20/2016 3:21:23 PM By: Regan Lemming BSN, RN Previous Signature: 03/20/2016 11:13:00 AM Version By: Ruthine Dose Entered By: Regan Lemming on 03/20/2016 11:14:27 [name] [name], [name] -------------------------------------------------------------------------------- Lower Extremity Assessment Details Patient Name: Cashatt, Roselie L. Date of Service: 03/20/2016 10:45 AM Medical Record Patient Account Number: [id number] Number: Treating RN: Baruch Gouty, RN, BSN, Rita 1941/06/22 [phone] y.o. Other Clinician: Date of Birth/Sex: Female) Treating ROBSON, MICHAEL Primary Care Physician: Margarita Rana Physician/Extender: G Referring Physician: Holland [name] in Treatment: 15 Vascular Assessment Pulses: Posterior Tibial Dorsalis Pedis Palpable: [Right:Yes] Extremity colors, hair growth, and conditions: Extremity Color: [Right:Mottled] Hair Growth on Extremity: [Right:No] Temperature of Extremity: [Right:Warm] Capillary Refill: [Right:< 3 seconds] Toe Nail Assessment Left: Right: Thick: No Discolored: No Deformed: No Improper Length and Hygiene: No Electronic Signature(s) Signed: 03/20/2016 10:45:40 AM By: Regan Lemming BSN, RN Entered By: Regan Lemming on 03/20/2016 10:45:40 [name] [name], [name] -------------------------------------------------------------------------------- Multi Wound Chart Details Patient Name: Lumb, Lillyth L. Date of Service:  03/20/2016 10:45 AM Medical Record Patient Account Number: [id number] Number: Treating RN: Baruch Gouty, RN, BSN, Rita 1941-03-03 [phone] y.o. Other Clinician: Date of Birth/Sex: Female) Treating ROBSON, Alsey Primary Care Physician: Margarita Rana Physician/Extender: G Referring Physician: Holland [name] in Treatment: 15 Vital Signs Height(in): 65 Pulse(bpm): 62 Weight(lbs): 134 Blood Pressure 111/43 (mmHg): Body Mass Index(BMI): 22 Temperature(F): 97.7 Respiratory Rate 17 (breaths/min): Photos: [1:No Photos] [N/A:N/A] Wound Location: [1:Right Lower Leg - Lateral] [N/A:N/A] Wounding  Event: [1:Trauma] [N/A:N/A] Primary Etiology: [1:Skin Tear] [N/A:N/A] Comorbid History: [1:Cataracts, Chronic Obstructive Pulmonary Disease (COPD), Hypertension, Osteoarthritis, Neuropathy, Confinement Anxiety] [N/A:N/A] Date Acquired: [1:10/11/2015] [N/A:N/A] Weeks of Treatment: [1:15] [N/A:N/A] Wound Status: [1:Open] [N/A:N/A] Measurements L x W x D 0.3x0.3x0.3 [N/A:N/A] (cm) Area (cm) : [1:0.071] [N/A:N/A] Volume (cm) : [1:0.021] [N/A:N/A] % Reduction in Area: [1:97.70%] [N/A:N/A] % Reduction in Volume: 93.30% [N/A:N/A] Classification: [1:Full Thickness Without Exposed Support Structures] [N/A:N/A] Exudate Amount: [1:Small] [N/A:N/A] Exudate Type: [1:Serosanguineous] [N/A:N/A] Exudate Color: [1:red, brown] [N/A:N/A] Wound Margin: [1:Distinct, outline attached] [N/A:N/A] Granulation Amount: [1:Large (67-100%)] [N/A:N/A] Granulation Quality: Pink, Pale N/A N/A Necrotic Amount: None Present (0%) N/A N/A Exposed Structures: Fascia: No N/A N/A Fat: No Tendon: No Muscle: No Joint: No Bone: No Limited to Skin Breakdown Epithelialization: Large (67-100%) N/A N/A Periwound Skin Texture: Edema: Yes N/A N/A Excoriation: No Induration: No Callus: No Crepitus: No Fluctuance: No Friable: No Rash: No Scarring: No Periwound Skin Moist: Yes N/A N/A Moisture: Maceration:  No Dry/Scaly: No Periwound Skin Color: Atrophie Blanche: No N/A N/A Cyanosis: No Ecchymosis: No Erythema: No Hemosiderin Staining: No Mottled: No Pallor: No Rubor: No Temperature: No Abnormality N/A N/A Tenderness on Yes N/A N/A Palpation: Wound Preparation: Ulcer Cleansing: N/A N/A Rinsed/Irrigated with Saline Topical Anesthetic Applied: None Treatment Notes Electronic Signature(s) Signed: 03/20/2016 3:21:23 PM By: Regan Lemming BSN, RN Entered By: Regan Lemming on 03/20/2016 11:02:00 [name] [name], [name] -------------------------------------------------------------------------------- [place] Details Patient Name: Quesinberry, Milania L. Date of Service: 03/20/2016 10:45 AM Medical Record Patient Account Number: [id number] Number: Treating RN: Baruch Gouty, RN, BSN, Rita 1941-04-08 [phone] y.o. Other Clinician: Date of Birth/Sex: Female) Treating ROBSON, MICHAEL Primary Care Physician: Margarita Rana Physician/Extender: G Referring Physician: Holland [name] in Treatment: 15 Active Inactive Orientation to the Wound Care Program Nursing Diagnoses: Knowledge deficit related to the wound healing center program Goals: Patient/caregiver will verbalize understanding of the [place] Program Date Initiated: 12/06/2015 Goal Status: Active Interventions: Provide education on orientation to the wound center Notes: Venous Leg Ulcer Nursing Diagnoses: Knowledge deficit related to disease process and management Potential for venous Insuffiency (use before diagnosis confirmed) Goals: Non-invasive venous studies are completed as ordered Date Initiated: 12/06/2015 Goal Status: Active Patient will maintain optimal edema control Date Initiated: 12/06/2015 Goal Status: Active Patient/caregiver will verbalize understanding of disease process and disease management Date Initiated: 12/06/2015 Goal Status: Active Verify adequate tissue perfusion prior  to therapeutic compression application Date Initiated: 12/06/2015 Goal Status: Active [name] [name], [name] Interventions: Assess peripheral edema status every visit. Compression as ordered Provide education on venous insufficiency Treatment Activities: Non-invasive vascular studies : 12/13/2015 Therapeutic compression applied : 12/13/2015 Notes: Wound/Skin Impairment Nursing Diagnoses: Impaired tissue integrity Knowledge deficit related to smoking impact on wound healing Knowledge deficit related to ulceration/compromised skin integrity Goals: Patient/caregiver will verbalize understanding of skin care regimen Date Initiated: 12/06/2015 Goal Status: Active Ulcer/skin breakdown will have a volume reduction of 30% by week 4 Date Initiated: 12/06/2015 Goal Status: Active Ulcer/skin breakdown will have a volume reduction of 50% by week 8 Date Initiated: 12/06/2015 Goal Status: Active Ulcer/skin breakdown will have a volume reduction of 80% by week 12 Date Initiated: 12/06/2015 Goal Status: Active Ulcer/skin breakdown will heal within 14 weeks Date Initiated: 12/06/2015 Goal Status: Active Interventions: Assess patient/caregiver ability to perform ulcer/skin care regimen upon admission and as needed Assess ulceration(s) every visit Provide education on ulcer and skin care Treatment Activities: Referred to DME Yamila Cragin for dressing supplies : 12/13/2015 Skin care regimen  initiated : 12/13/2015 Topical wound management initiated : 12/13/2015 [name] [name], [name] Notes: Electronic Signature(s) Signed: 03/20/2016 3:21:23 PM By: Regan Lemming BSN, RN Entered By: Regan Lemming on 03/20/2016 11:01:48 [name] [name], [name] -------------------------------------------------------------------------------- Pain Assessment Details Patient Name: Skoczylas, Kasarah L. Date of Service: 03/20/2016 10:45 AM Medical Record Patient Account Number: [id number] Number: Treating RN:  Baruch Gouty, RN, BSN, Rita 1941/05/24 [phone] y.o. Other Clinician: Date of Birth/Sex: Female) Treating ROBSON, MICHAEL Primary Care Physician: Margarita Rana Physician/Extender: G Referring Physician: Holland [name] in Treatment: 15 Active Problems Location of Pain Severity and Description of Pain Patient Has Paino No Site Locations With Dressing Change: No Pain Management and Medication Current Pain Management: Electronic Signature(s) Signed: 03/20/2016 10:45:54 AM By: Regan Lemming BSN, RN Entered By: Regan Lemming on 03/20/2016 10:45:53 [place], [place]. ([id number]) -------------------------------------------------------------------------------- Patient/Caregiver Education Details Patient Name: Wahlert, Pixie L. Date of Service: 03/20/2016 10:45 AM Medical Record Patient Account Number: [id number] Number: Treating RN: Baruch Gouty, RN, BSN, Rita 07/23/1941 [phone] y.o. Other Clinician: Date of Birth/Gender: Female) Treating ROBSON, MICHAEL Primary Care Physician: Margarita Rana Physician/Extender: G Referring Physician: Holland [name] in Treatment: 15 Education Assessment Education Provided To: Patient Education Topics Provided Venous: Methods: Explain/Verbal Responses: State content correctly Welcome To [facility] [place]: Methods: Explain/Verbal Responses: State content correctly Wound/Skin Impairment: Methods: Explain/Verbal Responses: State content correctly Electronic Signature(s) Signed: 03/20/2016 3:21:23 PM By: Regan Lemming BSN, RN Entered By: Regan Lemming on 03/20/2016 11:14:48 [name] [name], [name] -------------------------------------------------------------------------------- Wound Assessment Details Patient Name: [name] [name], [name] Date of Service: 03/20/2016 10:45 AM Medical Record Patient Account Number: [id number] Number: Treating RN: Baruch Gouty, RN, BSN, Rita 1940/10/02 [phone] y.o. Other Clinician: Date of Birth/Sex: Female) Treating ROBSON,  MICHAEL Primary Care Physician: Margarita Rana Physician/Extender: G Referring Physician: Holland [name] in Treatment: 15 Wound Status Wound Number: 1 Primary Skin Tear Etiology: Wound Location: Right Lower Leg - Lateral Wound Open Wounding Event: Trauma Status: Date Acquired: 10/11/2015 Comorbid Cataracts, Chronic Obstructive Weeks Of Treatment: 15 History: Pulmonary Disease (COPD), Clustered Wound: No Hypertension, Osteoarthritis, Neuropathy, Confinement Anxiety Photos Photo Uploaded By: Regan Lemming on 03/20/2016 15:22:17 Wound Measurements Length: (cm) 0.3 Width: (cm) 0.3 Depth: (cm) 0.3 Area: (cm) 0.071 Volume: (cm) 0.021 % Reduction in Area: 97.7% % Reduction in Volume: 93.3% Epithelialization: Large (67-100%) Tunneling: No Undermining: No Wound Description Full Thickness Without Exposed Classification: Support Structures Wound Margin: Distinct, outline attached Exudate Small Amount: Exudate Type: Serosanguineous Exudate Color: red, brown Bartnik, Quana L. ([id number]) Foul Odor After Cleansing: No Wound Bed Granulation Amount: Large (67-100%) Exposed Structure Granulation Quality: Pink, Pale Fascia Exposed: No Necrotic Amount: None Present (0%) Fat Layer Exposed: No Tendon Exposed: No Muscle Exposed: No Joint Exposed: No Bone Exposed: No Limited to Skin Breakdown Periwound Skin Texture Texture Color No Abnormalities Noted: No No Abnormalities Noted: No Callus: No Atrophie Blanche: No Crepitus: No Cyanosis: No Excoriation: No Ecchymosis: No Fluctuance: No Erythema: No Friable: No Hemosiderin Staining: No Induration: No Mottled: No Localized Edema: Yes Pallor: No Rash: No Rubor: No Scarring: No Temperature / Pain Moisture Temperature: No Abnormality No Abnormalities Noted: No Tenderness on Palpation: Yes Dry / Scaly: No Maceration: No Moist: Yes Wound Preparation Ulcer Cleansing: Rinsed/Irrigated with Saline Topical  Anesthetic Applied: None Treatment Notes Wound #1 (Right, Lateral Lower Leg) 1. Cleansed with: Clean wound with Normal Saline 4. Dressing Applied: Other dressing (specify in notes) 7. Secured with Self adhesive bandage Notes Sorbact, light coban Electronic Signature(s) Signed: 03/20/2016 3:21:23 PM By:  Afful, Velva Harman BSN, RN Entered By: Regan Lemming on 03/20/2016 11:00:46 [place]haw, Kiara L. ([id number]) Stiefel, Keyshia LMarland Kitchen ([id number]) -------------------------------------------------------------------------------- Vitals Details Patient Name: Rasco, Mannie L. Date of Service: 03/20/2016 10:45 AM Medical Record Patient Account Number: [id number] Number: Treating RN: Baruch Gouty, RN, BSN, Rita 1940/09/19 [phone] y.o. Other Clinician: Date of Birth/Sex: Female) Treating ROBSON, MICHAEL Primary Care Physician: Margarita Rana Physician/Extender: G Referring Physician: Holland [name] in Treatment: 15 Vital Signs Time Taken: 10:50 Temperature (F): 97.7 Height (in): 65 Pulse (bpm): 62 Weight (lbs): 134 Respiratory Rate (breaths/min): 17 Body Mass Index (BMI): 22.3 Blood Pressure (mmHg): 111/43 Reference Range: 80 - 120 mg / dl Electronic Signature(s) Signed: 03/20/2016 3:21:23 PM By: Regan Lemming BSN, RN Entered By: Regan Lemming on 03/20/2016 10:51:47

## 2016-03-21 NOTE — Progress Notes (Signed)
ADLAI, SOS ([id number]) Visit Report for 03/20/2016 Chief Complaint Document Details Patient Name: [name] Date of Service: 03/20/2016 10:45 AM Medical Record Patient Account Number: [id number] Number: Treating RN: [name] [name] 1941-07-25 [phone] y.o. Other Clinician: Date of Birth/Sex: Female) Treating [name] [name] Primary Care Physician/Extender: [name] [name] Physician: Referring Physician: Holland [name] in Treatment: 15 Information Obtained from: Patient Chief Complaint Patient is here for a chronic wound on her right anterior lateral leg Electronic Signature(s) Signed: 03/20/2016 3:57:44 PM By: [name] Ham MD Entered By: [name] [name] on 03/20/2016 11:09:42 [name] -------------------------------------------------------------------------------- HPI Details Patient Name: [name] Date of Service: 03/20/2016 10:45 AM Medical Record Patient Account Number: [id number] Number: Treating RN: [name] [name] [birth year] [phone] y.o. Other Clinician: Date of Birth/Sex: Female) Treating [name] [name] Primary Care Physician/Extender: [name] [name] Physician: Referring Physician: Holland [name] in Treatment: 15 History of Present Illness HPI Description: 12/06/15; this is a patient who has no prior wound history and is not a diabetic. In February she was getting into her family truck and the wind pushed the door against the outer aspect of her right lower leg. I believe there was initially some swelling but not clearly a hematoma. 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 Has been using topical antibiotics on this. 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 There is already been some improvement. She has no prior history of PAD and no major history of venous insufficiency. 12/13/15; wound appears healthy, using prisma 12/20/15; patient arrives complaining of  increasing pain around the wound. She also had a blister under the wound. 12/26/15; the patient arrives with the blister last week fully excised and healed however she has another floppy flaccid blister just below her wound. The wound does not appear to be infected. The cause of this is not really clear, he does walk 3 miles a day but doesn't think that the area with a blister is currently rubs the bandage at all. She said she felt a stinging and then noticed this on the weekend. She will complete the antibiotics I gave hertomorrow 01/02/16; the patient's original wound in the right lower leg requires debridement of surface slough and surrounding eschar. The base of this then looks healthy. The area that was a blister underneath this of uncertain etiology also appears to be better. Culture of this blister from last week was negative 01/09/16; the patient's original wound on the lower right lateral calf requires debridement of surface slough and surrounding eschar. Base of this looks healthy once again. She has another recurrent blister underneath this wound. This is his second one of these. I cultured the last one which was negative. The patient is very active and I wonder whether this has something to do with this there is no evidence of surrounding cellulitis 01/16/16; the patient's original wound on the lower right calf again requires debridement. Her subsequent wound below this which was a blister last week also has a fibrinous surface slough that requires debridement. Again the etiology of these wounds isn't completely clear to me. She does not have PAD, her ABI in this clinic was 1.17 on the right. She may have mild venous reflux but certainly does not have frank venous inflammation. She is very active telling me she walked 5 miles this weekend but states that she did not have anything that showed of caused pressure friction on the wound area.  01/23/16 both the patient's wounds appear to be  improved. Her subsequent wound which was a blister also appears to be smaller. 01/30/16; the inferior wound which was a secondary wound has resolved. Her original wound appears better. Still using Iodoflex 02/06/16; surprisingly the patient's wound is almost totally closed over except for a small probing, I'll on the medial aspect of the circumference of the wound. The issue here is she has never had tunneling in this, most of this was always superficial. This has a clear, nonpurulent, looking drainage 02/20/16: Patient arrives today stating she had an intensely puritic area just below her remaining small [name] wound. When she could no longer stand it she scratched a blister and now has a new area. There may be some swelling of the entire part of her leg ocontact dermatitis however she has not erythema OR PAIN and the wound no longer is itichy 02/28/16; everything seems to be much better except for the small deep wound part of the original area. This goes down roughly 0.5 cm. There is no subcutaneous tenderness crepitus or purulence. She has been using Iodosorb. She is traveling to [place] next week 03/13/16; the patient was not seen last week as she was traveling in [place]. I had hoped that this would respond to the Iodosorb ointment however the patient arrives today with still a whole of 0.4 cm. When I was doing my initial assessment today moderate amount of frank Green purulent material was expressed from the surface of the wound 03/20/16; culture I did of thick greenish drainage [a large amount for such a small wound] grew MRSA and a few Pseudomonas. I had put her on doxycycline already, I elected to see the wound before considering further antibiotic therapy. Patient reports less pain and less drainage and a smaller wound Electronic Signature(s) Signed: 03/20/2016 3:57:44 PM By: [name] Ham MD Entered By: [name] [name] on 03/20/2016 11:10:40 [name],  [name] -------------------------------------------------------------------------------- Physical Exam Details Patient Name: [name] Date of Service: 03/20/2016 10:45 AM Medical Record Patient Account Number: [id number] Number: Treating RN: [name] [name] 25-Nov-1940 [phone] y.o. Other Clinician: Date of Birth/Sex: Female) Treating [name] [name] Primary Care Physician/Extender: [name] [name] Physician: Referring Physician: Holland [name] in Treatment: 15 Notes Exam; wound is smaller still with a scant amount of drainage much less tenderness reported by the patient. There appears to be less surrounding tenderness and erythema Electronic Signature(s) Signed: 03/20/2016 3:57:44 PM By: [name] Ham MD Entered By: [name] [name] on 03/20/2016 11:11:23 [name] -------------------------------------------------------------------------------- Physician Orders Details Patient Name: [name] Date of Service: 03/20/2016 10:45 AM Medical Record Patient Account Number: [id number] Number: Treating RN: [name] [name] 1940-12-02 [phone] y.o. Other Clinician: Date of Birth/Sex: Female) Treating [name] [name] Primary Care Physician/Extender: [name] [name] Physician: Referring Physician: Holland [name] in Treatment: 15 Verbal / Phone Orders: Yes Clinician: Afful, RN, BSN, [name] Read Back and Verified: Yes Diagnosis Coding Wound Cleansing Wound #1 Right,Lateral Lower Leg o Cleanse wound with mild soap and water o May Shower, gently pat wound dry prior to applying new dressing. o May shower with protection. Primary Wound Dressing Wound #1 Right,Lateral Lower Leg o Cutimed Sorbact Secondary Dressing Wound #1 Right,Lateral Lower Leg o Non-adherent pad Dressing Change Frequency Wound #1 Right,Lateral Lower Leg o Change dressing every other day. Follow-up Appointments Wound #1  Right,Lateral Lower Leg o Return Appointment in 1 week. Edema Control Wound #1 Right,Lateral Lower  Leg o Other: - coban wrapped from ankle to mid calf Additional Orders / Instructions Wound #1 Right,Lateral Lower Leg o Increase protein intake. o Activity as tolerated Medications-please add to medication list. [name] Wound #1 Right,Lateral Lower Leg o P.O. Antibiotics - Cipro 500mg  BID x 7 days DOxy 100mg  BIDx 7days Patient Medications Allergies: codeine, diphehydramine, morphine sulphate, NSAIDS (Non-Steroidal Anti-Inflammatory Drug), tramadol, celecoxib, rofecoxib Notifications Medication Indication Start End ciprofloxacin HCl DOSE oral 500 mg tablet - tablet oral doxycycline hyclate 03/20/2016 DOSE oral 100 mg capsule - capsule oral Electronic Signature(s) Signed: 03/20/2016 3:21:23 PM By: Regan Lemming BSN, RN Signed: 03/20/2016 3:57:44 PM By: [name] Ham MD Entered By: Regan Lemming on 03/20/2016 11:13:25 [name] -------------------------------------------------------------------------------- Prescription 03/20/2016 Patient Name: Teare, Lempi [name] Physician: Ricard Dillon MD Date of Birth: 05-23-1941 NPI#: [id number] Sex: F DEA#: [id number] Phone #: [phone] License #: [id number] Patient Address: [place], [place] [address] [facility] [address] [place], [place] [address] [phone] Allergies codeine diphehydramine morphine sulphate NSAIDS (Non-Steroidal Anti-Inflammatory Drug) tramadol celecoxib rofecoxib Medication Medication: Route: Strength: Form: ciprofloxacin HCl oral 500 mg tablet Class: QUINOLONES Dose: Frequency / Time: Indication: tablet oral Number of Refills: Number of Units: 0 Generic Substitution: Start Date: End Date: Substitution Permitted Barnhill, Keonna [name] ([id number]) Administered  at Facility: No Note to Pharmacy: Signature(s): Date(s): ENAYA, WEYLAND ([id number]) Prescription 03/20/2016 Patient Name: Rezek, Jamilah [name] Physician: Ricard Dillon MD Date of Birth: May 12, 1941 NPI#: [id number] Sex: F DEA#: [id number] Phone #: [phone] License #: [id number] Patient Address: [place], [place] [address] [facility] [address], [place],  [address] [phone] Allergies codeine diphehydramine morphine sulphate NSAIDS (Non-Steroidal Anti-Inflammatory Drug) tramadol celecoxib rofecoxib Medication Medication: Route: Strength: Form: doxycycline hyclate oral 100 mg capsule Class: PERIODONTAL COLLAGENASE INHIBITORS Dose: Frequency / Time: Indication: capsule oral Number of Refills: Number of Units: 0 End Date: ARIYELLE, SEKI ([id number]) Generic Substitution: Start Date: Administered at Substitution Permitted [id number] Facility: No Note to Pharmacy: Signature(s): Date(s): Electronic Signature(s) Signed: 03/20/2016 3:57:44 PM By: [name] Ham MD Entered By: [name] [name] on 03/20/2016 11:13:56 [name] --------------------------------------------------------------------------------  Problem List Details Patient Name: [name] Date of Service: 03/20/2016 10:45 AM Medical Record Patient Account Number: [id number] Number: Treating RN: [name] [name] 04-18-1941 [phone] y.o. 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 -------------------------------------------------------------------------------- Progress Note Details Patient Name: Feltes, [name] Date of Service: 03/20/2016 10:45 AM Medical Record Patient Account Number: [id number] Number: Treating RN: [name] [name] 06/12/1941 [phone] y.o. Other Clinician: Date of Birth/Sex: Female) Treating [name] [name] Primary Care Physician/Extender: [name] [name] Physician: Referring Physician: Holland [name] in Treatment: 15 Subjective Chief Complaint Information obtained from Patient Patient is here for a chronic wound on her right anterior lateral leg History of Present Illness (HPI) 12/06/15; this is a patient who has no prior wound history and is not a diabetic. In February she  was getting into her family truck and the wind pushed the door against the outer aspect of her right lower leg. I believe there was initially some swelling but not clearly a hematoma. 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 Has been using topical antibiotics on this. 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 There is already been some improvement. She has no prior history of PAD and no major history of venous insufficiency. 12/13/15; wound appears healthy, using prisma 12/20/15; patient arrives complaining of increasing pain around the wound. She also had a blister under the wound. 12/26/15; the patient arrives with the blister last week fully excised and healed however she has another floppy flaccid blister just below her wound. The wound does not appear to be infected. The cause of this is not really clear, he does walk 3 miles a day but doesn't think that the area with a blister is currently rubs the bandage at all. She said she felt a  stinging and then noticed this on the weekend. She will complete the antibiotics I gave hertomorrow 01/02/16; the patient's original wound in the right lower leg requires debridement of surface slough and surrounding eschar. The base of this then looks healthy. The area that was a blister underneath this of uncertain etiology also appears to be better. Culture of this blister from last week was negative 01/09/16; the patient's original wound on the lower right lateral calf requires debridement of surface slough and surrounding eschar. Base of this looks healthy once again. She has another recurrent blister underneath this wound. This is his second one of these. I cultured the last one which was negative. The patient is very active and I wonder whether this has something to do with this there is no evidence of surrounding cellulitis 01/16/16; the patient's original wound on the lower right calf again requires debridement. Her subsequent wound below this which was a blister last week also has a fibrinous surface slough that requires debridement. Again the etiology of these wounds isn't completely clear to me. She does not have PAD, her ABI in this clinic was 1.17 on the right. She may have mild venous reflux but certainly does not have frank venous inflammation. She is very active telling me she walked 5 miles this weekend but states that she did not have anything that showed of caused pressure friction on the wound area. 01/23/16 both the patient's wounds appear to be improved. Her subsequent wound which was a blister also appears to be smaller. Oshields, Shirley [name] ([id number]) 01/30/16; the inferior wound which was a secondary wound has resolved. Her original wound appears better. Still using Iodoflex 02/06/16; surprisingly the patient's wound is almost totally closed over except for a small probing, I'll on the medial aspect of the circumference of the wound. The issue here is she has never had tunneling  in this, most of this was always superficial. This has a clear, nonpurulent, looking drainage 02/20/16: Patient arrives today stating she had an intensely puritic area just below her remaining small wound. When she could no longer stand it she scratched a blister and now has a new area. There may be some swelling of the entire part of her leg ocontact dermatitis however she has not erythema OR PAIN and the wound no longer is itichy 02/28/16; everything seems to be much better except for the small deep wound part of the original area. This goes down roughly 0.5 cm. There is no  subcutaneous tenderness crepitus or purulence. She has been using Iodosorb. She is traveling to [place] next week 03/13/16; the patient was not seen last week as she was traveling in [place]. I had hoped that this would respond to the Iodosorb ointment however the patient arrives today with still a whole of 0.4 cm. When I was doing my initial assessment today moderate amount of frank Green purulent material was expressed from the surface of the wound 03/20/16; culture I did of thick greenish drainage [a large amount for such a small wound] grew MRSA and a few Pseudomonas. I had put her on doxycycline already, I elected to see the wound before considering further antibiotic therapy. Patient reports less pain and less drainage and a smaller wound Objective Constitutional Vitals Time Taken: 10:50 AM, Height: 65 in, Weight: 134 lbs, BMI: 22.3, Temperature: 97.7 F, Pulse: 62 bpm, Respiratory Rate: 17 breaths/min, Blood Pressure: 111/43 mmHg. Integumentary (Hair, Skin) Wound #1 status is Open. Original cause of wound was Trauma. The wound is located on the Right,Lateral Lower Leg. The wound measures 0.3cm length x 0.3cm width x 0.3cm depth; 0.071cm^2 area and 0.021cm^3 volume. The wound is limited to skin breakdown. There is no tunneling or undermining noted. There is a small amount of serosanguineous drainage  noted. The wound margin is distinct with the outline attached to the wound base. There is large (67-100%) pink, pale granulation within the wound bed. There is no necrotic tissue within the wound bed. The periwound skin appearance exhibited: Localized Edema, Moist. The periwound skin appearance did not exhibit: Callus, Crepitus, Excoriation, Fluctuance, Friable, Induration, Rash, Scarring, Dry/Scaly, Maceration, Atrophie Blanche, Cyanosis, Ecchymosis, Hemosiderin Staining, Mottled, Pallor, Rubor, Erythema. Periwound temperature was noted as No Abnormality. The periwound has tenderness on palpation. Assessment Michelle, Special [name] 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 May shower with protection. Primary Wound Dressing: Wound #1 Right,Lateral Lower Leg: Cutimed Sorbact Secondary Dressing: Wound #1 Right,Lateral Lower Leg: Non-adherent pad Dressing Change Frequency: Wound #1 Right,Lateral Lower Leg: Change dressing every other day. Follow-up Appointments: Wound #1 Right,Lateral Lower Leg: Return Appointment in 1 week. Edema Control: Wound #1 Right,Lateral Lower Leg: Other: - coban wrapped from ankle to mid calf Additional Orders / Instructions: Wound #1 Right,Lateral Lower Leg: Increase protein intake. Activity as tolerated #1 we continued with Sorbact packing strips #2 in view of the continued drainage and improvement in the wound I have given her both doxycycline and Cipro for one further week Severa, JAMIN DOBROWSKI ([id number]) Electronic Signature(s) Signed: 03/20/2016 3:57:44 PM By: [name] Ham MD Entered By: [name] [name] on 03/20/2016 11:12:09 Lyles, Janeka [name]  ([id number]) -------------------------------------------------------------------------------- SuperBill Details Patient Name: Icenhower, Xayla [name] Date of Service: 03/20/2016 Medical Record Patient Account Number: [id number] Number: Treating RN: [name] [name] April 16, 1941 [phone] y.o. Other Clinician: Date of Birth/Sex: Female) Treating [name] [name] Primary Care Physician/Extender: [name] [name] Physician: Suella Grove in Treatment: 15 Referring Physician: Margarita Rana Diagnosis Coding ICD-10 Codes Code Description [phone] Non-pressure chronic ulcer of right calf with necrosis of muscle I87.2 Venous insufficiency (chronic) (peripheral) Facility Procedures CPT4 Code: [id number] Description: [id number] - WOUND CARE VISIT-LEV 2 EST PT Modifier: Quantity: 1 Physician Procedures CPT4 Code Description: [id number] - WC PHYS LEVEL 2 - EST PT ICD-10 Description Diagnosis  [id number] Non-pressure chronic ulcer of right calf with necro Modifier: sis of muscle Quantity: 1 Electronic Signature(s) Signed: 03/20/2016 3:57:44 PM By: [name] Ham MD Entered By: [name] [name] on 03/20/2016 11:13:48

## 2016-03-22 ENCOUNTER — Encounter: Payer: Self-pay | Admitting: Cardiothoracic Surgery

## 2016-03-22 ENCOUNTER — Ambulatory Visit (INDEPENDENT_AMBULATORY_CARE_PROVIDER_SITE_OTHER): Payer: PPO | Admitting: Cardiothoracic Surgery

## 2016-03-22 VITALS — BP 145/77 | HR 67 | Temp 98.3°F | Wt 134.0 lb

## 2016-03-22 DIAGNOSIS — R918 Other nonspecific abnormal finding of lung field: Secondary | ICD-10-CM

## 2016-03-22 NOTE — Progress Notes (Signed)
[name] [name] Inpatient Post-Op Note  Patient ID: [name] [name], female   DOB: May 28, 1941, 75 y.o.   MRN: [id number]  HISTORY: She returns today in follow-up. Her biggest problem is been with her right lower extremity and her crush injury that she suffered. She continues her follow-up with the wound care center and has been placed on another round of antibiotics. He denies any fevers. She does get shortness of breath particularly with the hot weather.   Vitals:   03/22/16 1109  BP: (!) 145/77  Pulse: 67  Temp: 98.3 F (36.8 C)     EXAM: Resp: Lungs are clear bilaterally.  No respiratory distress, normal effort. Heart:  Regular without murmurs Neurological: Alert and oriented to person, place, and time. Coordination normal.  Skin: Skin is warm and dry. No rash noted. No diaphoretic. No erythema. No pallor.  Psychiatric: Normal mood and affect. Normal behavior. Judgment and thought content normal.    ASSESSMENT: I have independently reviewed the patient's CT scan and went over that with her. The index nodule in the left upper lobe is essentially unchanged. The other nodules are relatively stable.   PLAN:   We will see her back again in 6 months time with another CT scan the chest.    Nestor Lewandowsky, MD

## 2016-03-22 NOTE — Patient Instructions (Signed)
We will call you with your CT Scan appointment.

## 2016-03-22 NOTE — Addendum Note (Signed)
Addended by: Wayna Chalet on: 03/22/2016 04:55 PM   Modules accepted: Orders

## 2016-03-27 ENCOUNTER — Encounter: Payer: PPO | Attending: Internal Medicine | Admitting: Internal Medicine

## 2016-03-27 DIAGNOSIS — L97213 Non-pressure chronic ulcer of right calf with necrosis of muscle: Secondary | ICD-10-CM | POA: Insufficient documentation

## 2016-03-27 DIAGNOSIS — L02435 Carbuncle of right lower limb: Secondary | ICD-10-CM | POA: Diagnosis not present

## 2016-03-27 DIAGNOSIS — I872 Venous insufficiency (chronic) (peripheral): Secondary | ICD-10-CM | POA: Insufficient documentation

## 2016-03-27 NOTE — Progress Notes (Addendum)
SHAVY, MIRACLE ([id number]) Visit Report for 03/27/2016 Arrival Information Details Patient Name: [name], [name] Date of Service: 03/27/2016 12:45 PM Medical Record Patient Account Number: [id number] Number: Treating RN: Baruch Gouty, RN, BSN, Rita 1940-10-18 [phone] y.o. Other Clinician: Date of Birth/Sex: Female) Treating ROBSON, [place] Primary Care Physician: Margarita Rana Physician/Extender: G Referring Physician: [name] in Treatment: 16 Visit Information History Since Last Visit All ordered tests and consults were completed: No Patient Arrived: Ambulatory Added or deleted any medications: No Arrival Time: 12:48 Any new allergies or adverse reactions: No Accompanied By: self Had a fall or experienced change in No Transfer Assistance: None activities of daily living that may affect Patient Identification Verified: Yes risk of falls: Secondary Verification Process Yes Signs or symptoms of abuse/neglect since last No Completed: visito Patient Requires Transmission-Based No Hospitalized since last visit: No Precautions: Has Dressing in Place as Prescribed: Yes Patient Has Alerts: No Pain Present Now: No Electronic Signature(s) Signed: 03/27/2016 12:48:24 PM By: Regan Lemming BSN, RN Entered By: Regan Lemming on 03/27/2016 12:48:24 [name], [name] -------------------------------------------------------------------------------- Clinic Level of Care Assessment Details Patient Name: [name], [name] Date of Service: 03/27/2016 12:45 PM Medical Record Patient Account Number: [id number] Number: Treating RN: Baruch Gouty, RN, BSN, Rita 1941-03-29 [phone] y.o. Other Clinician: Date of Birth/Sex: Female) Treating ROBSON, [place] Primary Care Physician: Margarita Rana Physician/Extender: G Referring Physician: [name] in Treatment: 16 Clinic Level of Care Assessment Items TOOL 4 Quantity Score []  - Use when only an EandM is performed on  FOLLOW-UP visit 0 ASSESSMENTS - Nursing Assessment / Reassessment X - Reassessment of Co-morbidities (includes updates in patient status) 1 10 X - Reassessment of Adherence to Treatment Plan 1 5 ASSESSMENTS - Wound and Skin Assessment / Reassessment X - Simple Wound Assessment / Reassessment - one wound 1 5 []  - Complex Wound Assessment / Reassessment - multiple wounds 0 []  - Dermatologic / Skin Assessment (not related to wound area) 0 ASSESSMENTS - Focused Assessment []  - Circumferential Edema Measurements - multi extremities 0 []  - Nutritional Assessment / Counseling / Intervention 0 []  - Lower Extremity Assessment (monofilament, tuning fork, pulses) 0 []  - Peripheral Arterial Disease Assessment (using hand held doppler) 0 ASSESSMENTS - Ostomy and/or Continence Assessment and Care []  - Incontinence Assessment and Management 0 []  - Ostomy Care Assessment and Management (repouching, etc.) 0 PROCESS - Coordination of Care X - Simple Patient / Family Education for ongoing care 1 15 []  - Complex (extensive) Patient / Family Education for ongoing care 0 []  - Staff obtains Programmer, systems, Records, Test Results / Process Orders 0 []  - Staff telephones HHA, Nursing Homes / Clarify orders / etc 0 [name], [name] []  - Routine Transfer to another Facility (non-emergent condition) 0 []  - Routine Hospital Admission (non-emergent condition) 0 []  - New Admissions / Biomedical engineer / Ordering NPWT, Apligraf, etc. 0 []  - Emergency Hospital Admission (emergent condition) 0 []  - Simple Discharge Coordination 0 []  - Complex (extensive) Discharge Coordination 0 PROCESS - Special Needs []  - Pediatric / Minor Patient Management 0 []  - Isolation Patient Management 0 []  - Hearing / Language / Visual special needs 0 []  - Assessment of Community assistance (transportation, D/C planning, etc.) 0 []  - Additional assistance / Altered mentation 0 []  - Support Surface(s) Assessment (bed, cushion,  seat, etc.) 0 INTERVENTIONS - Wound Cleansing / Measurement X - Simple Wound Cleansing - one wound 1 5 []  - Complex Wound Cleansing - multiple wounds 0 []  -  Wound Imaging (photographs - any number of wounds) 0 []  - Wound Tracing (instead of photographs) 0 X - Simple Wound Measurement - one wound 1 5 []  - Complex Wound Measurement - multiple wounds 0 INTERVENTIONS - Wound Dressings X - Small Wound Dressing one or multiple wounds 1 10 []  - Medium Wound Dressing one or multiple wounds 0 []  - Large Wound Dressing one or multiple wounds 0 []  - Application of Medications - topical 0 []  - Application of Medications - injection 0 [name], [name] INTERVENTIONS - Miscellaneous []  - External ear exam 0 []  - Specimen Collection (cultures, biopsies, blood, body fluids, etc.) 0 []  - Specimen(s) / Culture(s) sent or taken to Lab for analysis 0 []  - Patient Transfer (multiple staff / Harrel Lemon Lift / Similar devices) 0 []  - Simple Staple / Suture removal (25 or less) 0 []  - Complex Staple / Suture removal (26 or more) 0 []  - Hypo / Hyperglycemic Management (close monitor of Blood Glucose) 0 []  - Ankle / Brachial Index (ABI) - do not check if billed separately 0 X - Vital Signs 1 5 Has the patient been seen at the hospital within the last three years: Yes Total Score: 60 Level Of Care: New/Established - Level 2 Electronic Signature(s) Signed: 03/27/2016 4:53:09 PM By: Regan Lemming BSN, RN Entered By: Regan Lemming on 03/27/2016 13:09:59 [name], [name] -------------------------------------------------------------------------------- Encounter Discharge Information Details Patient Name: [name], [name] Date of Service: 03/27/2016 12:45 PM Medical Record Patient Account Number: [id number] Number: Treating RN: Baruch Gouty, RN, BSN, Rita 09/26/1940 [phone] y.o. Other Clinician: Date of Birth/Sex: Female) Treating ROBSON, MICHAEL Primary Care Physician: Margarita Rana Physician/Extender: G Referring Physician: [name] in Treatment: 16 Encounter Discharge Information Items Discharge Pain Level: 0 Discharge Condition: Stable Ambulatory Status: Ambulatory Discharge Destination: Home Transportation: Private Auto Accompanied By: self Schedule Follow-up Appointment: No Medication Reconciliation completed and provided to Patient/Care No Michelene [place]: Provided on Clinical Summary of Care: 03/27/2016 Form Type Recipient Paper Patient NB Electronic Signature(s) Signed: 03/27/2016 4:53:09 PM By: Regan Lemming BSN, RN Previous Signature: 03/27/2016 1:10:22 PM Version By: Ruthine Dose Entered By: Regan Lemming on 03/27/2016 13:10:47 Caples, Tanishia L. ([id number]) -------------------------------------------------------------------------------- Lower Extremity Assessment Details Patient Name: [name], [name] Date of Service: 03/27/2016 12:45 PM Medical Record Patient Account Number: [id number] Number: Treating RN: Baruch Gouty, RN, BSN, Rita October 10, 1940 [phone] y.o. Other Clinician: Date of Birth/Sex: Female) Treating ROBSON, [place] Primary Care Physician: Margarita Rana Physician/Extender: G Referring Physician: [name] in Treatment: 16 Edema Assessment Assessed: [Left: No] [Right: No] Edema: [Left: N] [Right: o] Vascular Assessment Claudication: Claudication Assessment [Right:None] Pulses: Posterior Tibial Dorsalis Pedis Palpable: [Right:Yes] Extremity colors, hair growth, and conditions: Extremity Color: [Right:Mottled] Hair Growth on Extremity: [Right:No] Temperature of Extremity: [Right:Warm] Capillary Refill: [Right:< 3 seconds] Toe Nail Assessment Left: Right: Thick: No Discolored: No Deformed: No Improper Length and Hygiene: No Electronic Signature(s) Signed: 03/27/2016 12:48:54 PM By: Regan Lemming BSN, RN Entered By: Regan Lemming on 03/27/2016 12:48:54 Consuegra, Aalaysia L.  ([id number]) -------------------------------------------------------------------------------- Multi Wound Chart Details Patient Name: [name], [name] Date of Service: 03/27/2016 12:45 PM Medical Record Patient Account Number: [id number] Number: Treating RN: Baruch Gouty, RN, BSN, Rita [birth year] [phone] y.o. Other Clinician: Date of Birth/Sex: Female) Treating ROBSON, [place] Primary Care Physician: Margarita Rana Physician/Extender: G Referring Physician: [name] in Treatment: 16 Vital Signs Height(in): 65 Pulse(bpm): 68 Weight(lbs): 134 Blood Pressure 137/46 (mmHg): Body Mass Index(BMI): 22 Temperature(F): 97.9 Respiratory Rate 17 (breaths/min): Photos: [  1:No Photos] [4:No Photos] [N/A:N/A] Wound Location: [1:Right Lower Leg - Lateral] [4:Right Lower Leg - Lateral] [N/A:N/A] Wounding Event: [1:Trauma] [4:Blister] [N/A:N/A] Primary Etiology: [1:Skin Tear] [4:Skin Tear] [N/A:N/A] Comorbid History: [1:Cataracts, Chronic Obstructive Pulmonary Disease (COPD), Hypertension, Osteoarthritis, Neuropathy, Confinement Anxiety] [4:Cataracts, Chronic Obstructive Pulmonary Disease (COPD), Hypertension, Osteoarthritis, Neuropathy, Confinement  Anxiety] [N/A:N/A] Date Acquired: [1:10/11/2015] [4:03/26/2016] [N/A:N/A] Weeks of Treatment: [1:16] [4:0] [N/A:N/A] Wound Status: [1:Healed - Epithelialized] [4:Open] [N/A:N/A] Measurements L x W x D 0x0x0 [4:0.5x0.6x0.1] [N/A:N/A] (cm) Area (cm) : [1:0] [4:0.236] [N/A:N/A] Volume (cm) : [1:0] [4:0.024] [N/A:N/A] % Reduction in Area: [1:100.00%] [4:0.00%] [N/A:N/A] % Reduction in Volume: 100.00% [4:0.00%] [N/A:N/A] Classification: [1:Full Thickness Without Exposed Support Structures] [4:Partial Thickness] [N/A:N/A] Exudate Amount: [1:None Present] [4:Small] [N/A:N/A] Exudate Type: [1:N/A] [4:Serosanguineous] [N/A:N/A] Exudate Color: [1:N/A] [4:red, brown] [N/A:N/A] Wound Margin: [1:Distinct, outline attached] [4:Distinct, outline  attached] [N/A:N/A] Granulation Amount: [1:None Present (0%)] [4:Large (67-100%)] [N/A:N/A] Granulation Quality: N/A Pink, Pale N/A Necrotic Amount: None Present (0%) None Present (0%) N/A Exposed Structures: Fascia: No Fascia: No N/A Fat: No Fat: No Tendon: No Tendon: No Muscle: No Muscle: No Joint: No Joint: No Bone: No Bone: No Limited to Skin Limited to Skin Breakdown Breakdown Epithelialization: Large (67-100%) None N/A Periwound Skin Texture: Edema: Yes Edema: Yes N/A Excoriation: No Excoriation: No Induration: No Induration: No Callus: No Callus: No Crepitus: No Crepitus: No Fluctuance: No Fluctuance: No Friable: No Friable: No Rash: No Rash: No Scarring: No Scarring: No Periwound Skin Dry/Scaly: Yes Moist: Yes N/A Moisture: Maceration: No Maceration: No Moist: No Dry/Scaly: No Periwound Skin Color: Atrophie Blanche: No Atrophie Blanche: No N/A Cyanosis: No Cyanosis: No Ecchymosis: No Ecchymosis: No Erythema: No Erythema: No Hemosiderin Staining: No Hemosiderin Staining: No Mottled: No Mottled: No Pallor: No Pallor: No Rubor: No Rubor: No Temperature: No Abnormality No Abnormality N/A Tenderness on No No N/A Palpation: Wound Preparation: Ulcer Cleansing: Ulcer Cleansing: N/A Rinsed/Irrigated with Rinsed/Irrigated with Saline Saline Topical Anesthetic Topical Anesthetic Applied: None Applied: None Treatment Notes Electronic Signature(s) Signed: 03/27/2016 4:53:09 PM By: Regan Lemming BSN, RN Entered By: Regan Lemming on 03/27/2016 13:07:52 Mielke, Jaysha L. ([id number]) -------------------------------------------------------------------------------- [place] Details Patient Name: [name], [name] Date of Service: 03/27/2016 12:45 PM Medical Record Patient Account Number: [id number] Number: Treating RN: Baruch Gouty, RN, BSN, Rita 11-14-1940 [phone] y.o. Other Clinician: Date of Birth/Sex: Female) Treating ROBSON,  MICHAEL Primary Care Physician: Margarita Rana Physician/Extender: G Referring Physician: [name] in Treatment: 16 Active Inactive Orientation to the Wound Care Program Nursing Diagnoses: Knowledge deficit related to the wound healing center program Goals: Patient/caregiver will verbalize understanding of the [facility] Program Date Initiated: 12/06/2015 Goal Status: Active Interventions: Provide education on orientation to the wound center Notes: Venous Leg Ulcer Nursing Diagnoses: Knowledge deficit related to disease process and management Potential for venous Insuffiency (use before diagnosis confirmed) Goals: Non-invasive venous studies are completed as ordered Date Initiated: 12/06/2015 Goal Status: Active Patient will maintain optimal edema control Date Initiated: 12/06/2015 Goal Status: Active Patient/caregiver will verbalize understanding of disease process and disease management Date Initiated: 12/06/2015 Goal Status: Active Verify adequate tissue perfusion prior to therapeutic compression application Date Initiated: 12/06/2015 Goal Status: Active [name], [name] ([id number]) Interventions: Assess peripheral edema status every visit. Compression as ordered Provide education on venous insufficiency Treatment Activities: Non-invasive vascular studies : 12/13/2015 Therapeutic compression applied : 12/13/2015 Notes: Wound/Skin Impairment Nursing Diagnoses: Impaired tissue integrity Knowledge deficit related to smoking impact on wound healing Knowledge deficit related to ulceration/compromised skin integrity  Goals: Patient/caregiver will verbalize understanding of skin care regimen Date Initiated: 12/06/2015 Goal Status: Active Ulcer/skin breakdown will have a volume reduction of 30% by week 4 Date Initiated: 12/06/2015 Goal Status: Active Ulcer/skin breakdown will have a volume reduction of 50% by week 8 Date Initiated: 12/06/2015 Goal Status:  Active Ulcer/skin breakdown will have a volume reduction of 80% by week 12 Date Initiated: 12/06/2015 Goal Status: Active Ulcer/skin breakdown will heal within 14 weeks Date Initiated: 12/06/2015 Goal Status: Active Interventions: Assess patient/caregiver ability to perform ulcer/skin care regimen upon admission and as needed Assess ulceration(s) every visit Provide education on ulcer and skin care Treatment Activities: Referred to DME Jere Vanburen for dressing supplies : 12/13/2015 Skin care regimen initiated : 12/13/2015 Topical wound management initiated : 12/13/2015 [name], [name] ([id number]) Notes: Electronic Signature(s) Signed: 03/27/2016 4:53:09 PM By: Regan Lemming BSN, RN Entered By: Regan Lemming on 03/27/2016 13:03:42 [name], [name] -------------------------------------------------------------------------------- Pain Assessment Details Patient Name: Kock, [name] Date of Service: 03/27/2016 12:45 PM Medical Record Patient Account Number: [id number] Number: Treating RN: Baruch Gouty, RN, BSN, Rita 1940-12-12 [phone] y.o. Other Clinician: Date of Birth/Sex: Female) Treating ROBSON, MICHAEL Primary Care Physician: Margarita Rana Physician/Extender: G Referring Physician: [name] in Treatment: 16 Active Problems Location of Pain Severity and Description of Pain Patient Has Paino No Site Locations With Dressing Change: No Pain Management and Medication Current Pain Management: Electronic Signature(s) Signed: 03/27/2016 12:48:32 PM By: Regan Lemming BSN, RN Entered By: Regan Lemming on 03/27/2016 12:48:32 Forester, Daphney L. ([id number]) -------------------------------------------------------------------------------- Patient/Caregiver Education Details Patient Name: Cong, Terrin L. Date of Service: 03/27/2016 12:45 PM Medical Record Patient Account Number: [id number] Number: Treating RN: Baruch Gouty, RN, BSN, Rita Feb 08, 1941 [phone] y.o. Other Clinician: Date  of Birth/Gender: Female) Treating ROBSON, MICHAEL Primary Care Physician: Margarita Rana Physician/Extender: G Referring Physician: [name] in Treatment: 16 Education Assessment Education Provided To: Patient Education Topics Provided Venous: Methods: Explain/Verbal Responses: State content correctly Welcome To [facility]: Methods: Explain/Verbal Responses: State content correctly Wound/Skin Impairment: Methods: Explain/Verbal Responses: State content correctly Electronic Signature(s) Signed: 03/27/2016 4:53:09 PM By: Regan Lemming BSN, RN Entered By: Regan Lemming on 03/27/2016 13:11:05 Jaskowiak, Zuzanna L. ([id number]) -------------------------------------------------------------------------------- Wound Assessment Details Patient Name: Dayhoff, Mallorie L. Date of Service: 03/27/2016 12:45 PM Medical Record Patient Account Number: [id number] Number: Treating RN: Baruch Gouty, RN, BSN, Rita 11-Feb-1941 [phone] y.o. Other Clinician: Date of Birth/Sex: Female) Treating ROBSON, [place] Primary Care Physician: Margarita Rana Physician/Extender: G Referring Physician: [name] in Treatment: 16 Wound Status Wound Number: 1 Primary Skin Tear Etiology: Wound Location: Right Lower Leg - Lateral Wound Healed - Epithelialized Wounding Event: Trauma Status: Date Acquired: 10/11/2015 Comorbid Cataracts, Chronic Obstructive Weeks Of Treatment: 16 History: Pulmonary Disease (COPD), Clustered Wound: No Hypertension, Osteoarthritis, Neuropathy, Confinement Anxiety Photos Photo Uploaded By: Regan Lemming on 03/27/2016 16:13:55 Wound Measurements Length: (cm) 0 % Reduction i Width: (cm) 0 % Reduction i Depth: (cm) 0 Epithelializa Area: (cm) 0 Tunneling: Volume: (cm) 0 Undermining: n Area: 100% n Volume: 100% tion: Large (67-100%) No No Wound Description Classification: Mcclish, Alaine L. ([id number]) Foul Odor After Cleansing: No Full Thickness Without  Exposed Support Structures Wound Margin: Distinct, outline attached Exudate None Present Amount: Wound Bed Granulation Amount: None Present (0%) Exposed Structure Necrotic Amount: None Present (0%) Fascia Exposed: No Fat Layer Exposed: No Tendon Exposed: No Muscle Exposed: No Joint Exposed: No Bone Exposed: No Limited to Skin Breakdown Periwound Skin Texture Texture Color No Abnormalities Noted:  No No Abnormalities Noted: No Callus: No Atrophie Blanche: No Crepitus: No Cyanosis: No Excoriation: No Ecchymosis: No Fluctuance: No Erythema: No Friable: No Hemosiderin Staining: No Induration: No Mottled: No Localized Edema: Yes Pallor: No Rash: No Rubor: No Scarring: No Temperature / Pain Moisture Temperature: No Abnormality No Abnormalities Noted: No Dry / Scaly: Yes Maceration: No Moist: No Wound Preparation Ulcer Cleansing: Rinsed/Irrigated with Saline Topical Anesthetic Applied: None Electronic Signature(s) Signed: 03/27/2016 4:53:09 PM By: Regan Lemming BSN, RN Entered By: Regan Lemming on 03/27/2016 13:03:27 Sundt, Laquenta L. ([id number]) -------------------------------------------------------------------------------- Wound Assessment Details Patient Name: Lovings, Angelena L. Date of Service: 03/27/2016 12:45 PM Medical Record Patient Account Number: [id number] Number: Treating RN: Baruch Gouty, RN, BSN, Rita 29-Jul-1941 [phone] y.o. Other Clinician: Date of Birth/Sex: Female) Treating ROBSON, MICHAEL Primary Care Physician: Margarita Rana Physician/Extender: G Referring Physician: [name] in Treatment: 16 Wound Status Wound Number: 4 Primary Skin Tear Etiology: Wound Location: Right Lower Leg - Lateral Wound Open Wounding Event: Blister Status: Date Acquired: 03/26/2016 Comorbid Cataracts, Chronic Obstructive Weeks Of Treatment: 0 History: Pulmonary Disease (COPD), Clustered Wound: No Hypertension, Osteoarthritis, Neuropathy, Confinement  Anxiety Photos Photo Uploaded By: Regan Lemming on 03/27/2016 16:13:55 Wound Measurements Length: (cm) 0.5 Width: (cm) 0.6 Depth: (cm) 0.1 Area: (cm) 0.236 Volume: (cm) 0.024 % Reduction in Area: 0% % Reduction in Volume: 0% Epithelialization: None Tunneling: No Undermining: No Wound Description Classification: Partial Thickness Sedberry, Elesa L. ([id number]) Foul Odor After Cleansing: No Wound Margin: Distinct, outline attached Exudate Amount: Small Exudate Type: Serosanguineous Exudate Color: red, brown Wound Bed Granulation Amount: Large (67-100%) Exposed Structure Granulation Quality: Pink, Pale Fascia Exposed: No Necrotic Amount: None Present (0%) Fat Layer Exposed: No Tendon Exposed: No Muscle Exposed: No Joint Exposed: No Bone Exposed: No Limited to Skin Breakdown Periwound Skin Texture Texture Color No Abnormalities Noted: No No Abnormalities Noted: No Callus: No Atrophie Blanche: No Crepitus: No Cyanosis: No Excoriation: No Ecchymosis: No Fluctuance: No Erythema: No Friable: No Hemosiderin Staining: No Induration: No Mottled: No Localized Edema: Yes Pallor: No Rash: No Rubor: No Scarring: No Temperature / Pain Moisture Temperature: No Abnormality No Abnormalities Noted: No Dry / Scaly: No Maceration: No Moist: Yes Wound Preparation Ulcer Cleansing: Rinsed/Irrigated with Saline Topical Anesthetic Applied: None Treatment Notes Wound #4 (Right, Lateral Lower Leg) 1. Cleansed with: Clean wound with Normal Saline 4. Dressing Applied: Other dressing (specify in notes) 5. Secondary [place] Signature(s) LAURALEA, FASICK ([id number]) Signed: 03/27/2016 4:53:09 PM By: Regan Lemming BSN, RN Entered By: Regan Lemming on 03/27/2016 13:03:01 Pieczynski, Marykate L. ([id number]) -------------------------------------------------------------------------------- Vitals Details Patient Name: Andress, Burnett L. Date of Service: 03/27/2016  12:45 PM Medical Record Patient Account Number: [id number] Number: Treating RN: Baruch Gouty, RN, BSN, Rita 06-30-1941 [phone] y.o. Other Clinician: Date of Birth/Sex: Female) Treating ROBSON, MICHAEL Primary Care Physician: Margarita Rana Physician/Extender: G Referring Physician: [name] in Treatment: 16 Vital Signs Time Taken: 12:52 Temperature (F): 97.9 Height (in): 65 Pulse (bpm): 68 Weight (lbs): 134 Respiratory Rate (breaths/min): 17 Body Mass Index (BMI): 22.3 Blood Pressure (mmHg): 137/46 Reference Range: 80 - 120 mg / dl Electronic Signature(s) Signed: 03/27/2016 4:53:09 PM By: Regan Lemming BSN, RN Entered By: Regan Lemming on 03/27/2016 12:52:47

## 2016-03-28 NOTE — Progress Notes (Signed)
[name] Visit Report for 03/27/2016 Chief Complaint Document Details Patient Name: [name], [name] Date of Service: 03/27/2016 12:45 PM Medical Record Patient Account Number: [id number] Number: Treating RN: [name] Gouty, RN, BSN, [name] [name] 04-20-1941 [phone] y.o. Other Clinician: Date of Birth/Sex: Female) Treating [name] [name] Primary Care Physician/Extender: [name] [name] Physician: Referring Physician: Holland [name] in Treatment: 16 Information Obtained from: Patient Chief Complaint Patient is here for a chronic wound on her right anterior lateral leg Electronic Signature(s) Signed: 03/27/2016 4:24:05 PM By: [name] Ham MD Entered By: [name] [name] on 03/27/2016 13:13:41 [name], [name] -------------------------------------------------------------------------------- HPI Details Patient Name: [name], [name] Date of Service: 03/27/2016 12:45 PM Medical Record Patient Account Number: [id number] Number: Treating RN: [name] Gouty, RN, BSN, [name] [name] [birth year] [phone] y.o. Other Clinician: Date of Birth/Sex: Female) Treating [name] [name] Primary Care Physician/Extender: [name] [name] Physician: Referring Physician: Holland [name] in Treatment: 16 History of Present Illness HPI Description: 12/06/15; this is a patient who has no prior wound history and is not a diabetic. In February she was getting into her family truck and the wind pushed the door against the outer aspect of her right lower leg. I believe there was initially some swelling but not clearly a hematoma. 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 Has been using topical antibiotics on this. 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 There is already been some improvement. She has no prior history of PAD and no major history of venous insufficiency. 12/13/15; wound appears healthy, using prisma 12/20/15; patient arrives complaining of  increasing pain around the wound. She also had a blister under the wound. 12/26/15; the patient arrives with the blister last week fully excised and healed however she has another floppy flaccid blister just below her wound. The wound does not appear to be infected. The cause of this is not really clear, he does walk 3 miles a day but doesn't think that the area with a blister is currently rubs the bandage at all. She said she felt a stinging and then noticed this on the weekend. She will complete the antibiotics I gave hertomorrow 01/02/16; the patient's original wound in the right lower leg requires debridement of surface slough and surrounding eschar. The base of this then looks healthy. The area that was a blister underneath this of uncertain etiology also appears to be better. Culture of this blister from last week was negative 01/09/16; the patient's original wound on the lower right lateral calf requires debridement of surface slough and surrounding eschar. Base of this looks healthy once again. She has another recurrent blister underneath this wound. This is his second one of these. I cultured the last one which was negative. The patient is very active and I wonder whether this has something to do with this there is no evidence of surrounding cellulitis 01/16/16; the patient's original wound on the lower right calf again requires debridement. Her subsequent wound below this which was a blister last week also has a fibrinous surface slough that requires debridement. Again the etiology of these wounds isn't completely clear to me. She does not have PAD, her ABI in this clinic was 1.17 on the right. She may have mild venous reflux but certainly does not have frank venous inflammation. She is very active telling me she walked 5 miles this weekend but states that she did not have anything that showed of caused pressure friction on the wound area.  01/23/16 both the patient's wounds appear to be  improved. Her subsequent wound which was a blister also appears to be smaller. 01/30/16; the inferior wound which was a secondary wound has resolved. Her original wound appears better. Still using Iodoflex 02/06/16; surprisingly the patient's wound is almost totally closed over except for a small probing, I'll on the medial aspect of the circumference of the wound. The issue here is she has never had tunneling in this, most of this was always superficial. This has a clear, nonpurulent, looking drainage 02/20/16: Patient arrives today stating she had an intensely puritic area just below her remaining small [name], [name] ([id number]) wound. When she could no longer stand it she scratched a blister and now has a new area. There may be some swelling of the entire part of her leg ocontact dermatitis however she has not erythema OR PAIN and the wound no longer is itichy 02/28/16; everything seems to be much better except for the small deep wound part of the original area. This goes down roughly 0.5 cm. There is no subcutaneous tenderness crepitus or purulence. She has been using Iodosorb. She is traveling to [place] next week 03/13/16; the patient was not seen last week as she was traveling in [place]. I had hoped that this would respond to the Iodosorb ointment however the patient arrives today with still a whole of 0.4 cm. When I was doing my initial assessment today moderate amount of frank Green purulent material was expressed from the surface of the wound 03/20/16; culture I did of thick greenish drainage [a large amount for such a small wound] grew MRSA and a few Pseudomonas. I had put her on doxycycline already, I elected to see the wound before considering further antibiotic therapy. Patient reports less pain and less drainage and a smaller wound 03/27/16 wound has acutally closed however she has developed another blister, this has opened into another open areal. The draining wound  from last week is closed. Electronic Signature(s) Signed: 03/27/2016 4:24:05 PM By: [name] Ham MD Entered By: [name] [name] on 03/27/2016 13:17:28 [name] -------------------------------------------------------------------------------- Physical Exam Details Patient Name: [name], [name] Date of Service: 03/27/2016 12:45 PM Medical Record Patient Account Number: [id number] Number: Treating RN: [name] Gouty, RN, BSN, [name] [name] 01/09/1941 [phone] y.o. Other Clinician: Date of Birth/Sex: Female) Treating Gilbert Manolis Primary Care Physician/Extender: [name] [name] Physician: Referring Physician: Holland [name] in Treatment: 16 Notes Wound exam; the draining open area from 2 weeks ago has resolved. She is finishing her antibiotics today. Just below this is another open area. Although this was covered with a Telfa cover [allergic to Aleevyn} she does walk 3-1/2 miles a day and I wonder if this is the cause of the recurrent blisters/small ulcerations. I think she had a secondary cellulitis recently which we have successfully treated Electronic Signature(s) Signed: 03/27/2016 4:24:05 PM By: [name] Ham MD Entered By: [name] [name] on 03/27/2016 13:21:16 [name], [place]berry. ([id number]) -------------------------------------------------------------------------------- Physician Orders Details Patient Name: [name], [name] Date of Service: 03/27/2016 12:45 PM Medical Record Patient Account Number: [id number] Number: Treating RN: [name] Gouty, RN, BSN, [name] [name] 09/30/1940 [phone] y.o. Other Clinician: Date of Birth/Sex: Female) Treating Karlynn Furrow Primary Care Physician/Extender: [name] [name] Physician: Referring Physician: Holland [name] in Treatment: 16 Verbal / Phone Orders: Yes Clinician: Afful, RN, BSN, [name] [name] Read Back and Verified: Yes Diagnosis Coding Wound Cleansing Wound #4 Right,Lateral Lower Leg o Cleanse wound with mild soap and  water Primary Wound Dressing  Wound #4 Right,Lateral Lower Leg o Other: - Sorbact Secondary Dressing Wound #4 Right,Lateral Lower Leg o Non-adherent pad Dressing Change Frequency Wound #4 Right,Lateral Lower Leg o Change dressing every other day. Follow-up Appointments Wound #4 Right,Lateral Lower Leg o Return Appointment in 2 weeks. Edema Control Wound #4 Right,Lateral Lower Leg o Patient to wear own compression stockings Electronic Signature(s) Signed: 03/27/2016 4:24:05 PM By: [name] Ham MD Signed: 03/27/2016 4:53:09 PM By: Regan Lemming BSN, RN Entered By: Regan Lemming on 03/27/2016 13:09:28 [name], [name] ([id number]) -------------------------------------------------------------------------------- Problem List Details Patient Name: [name], [name] Date of Service: 03/27/2016 12:45 PM Medical Record Patient Account Number: [id number] Number: Treating RN: [name] Gouty, RN, BSN, [name] [name] 07-19-1941 [phone] y.o. 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 ([id number]) -------------------------------------------------------------------------------- Progress Note Details Patient Name: [name], [name] Date of Service: 03/27/2016 12:45 PM Medical Record Patient Account Number: [id number] Number: Treating RN: [name] Gouty, RN, BSN, [name] [name] 08-16-1941 [phone] y.o. Other Clinician: Date of Birth/Sex: Female) Treating Rihana Kiddy,  Yao Hyppolite Primary Care Physician/Extender: [name] [name] Physician: Referring Physician: Holland [name] in Treatment: 16 Subjective Chief Complaint Information obtained from Patient Patient is here for a chronic wound on her right anterior lateral leg History of Present Illness (HPI) 12/06/15; this is a patient who has no prior wound history and is not a diabetic. In February she was getting into her family truck and the wind pushed the door against the outer aspect of her right lower leg. I believe there was initially some swelling but not clearly a hematoma. 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 Has been using topical antibiotics on this. 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 There is already been some improvement. She has no prior history of PAD and no major history of venous insufficiency. 12/13/15; wound appears healthy, using prisma 12/20/15; patient arrives complaining of increasing pain around the wound. She also had a blister under the wound. 12/26/15; the patient arrives with the blister last week fully excised and healed however she has another floppy flaccid blister just below her wound. The wound does not appear to be infected. The cause of this is not really clear, he does walk 3 miles a day but doesn't think that the area with a blister is currently rubs the bandage at all. She said she felt a stinging and then noticed this on the weekend. She will complete the antibiotics I gave hertomorrow 01/02/16; the patient's original wound in the right lower leg requires debridement of surface slough and surrounding eschar. The base of this then looks healthy. The area that was a blister underneath this of uncertain etiology also appears to be better. Culture of this blister from last week was negative 01/09/16; the patient's original wound on the lower right lateral calf requires debridement of surface slough and surrounding eschar. Base  of this looks healthy once again. She has another recurrent blister underneath this wound. This is his second one of these. I cultured the last one which was negative. The patient is very active and I wonder whether  this has something to do with this there is no evidence of surrounding cellulitis 01/16/16; the patient's original wound on the lower right calf again requires debridement. Her subsequent wound below this which was a blister last week also has a fibrinous surface slough that requires debridement. Again the etiology of these wounds isn't completely clear to me. She does not have PAD, her ABI in this clinic was 1.17 on the right. She may have mild venous reflux but certainly does not have frank venous inflammation. She is very active telling me she walked 5 miles this weekend but states that she did not have anything that showed of caused pressure friction on the wound area. 01/23/16 both the patient's wounds appear to be improved. Her subsequent wound which was a blister also appears to be smaller. [name], [name] 01/30/16; the inferior wound which was a secondary wound has resolved. Her original wound appears better. Still using Iodoflex 02/06/16; surprisingly the patient's wound is almost totally closed over except for a small probing, I'll on the medial aspect of the circumference of the wound. The issue here is she has never had tunneling in this, most of this was always superficial. This has a clear, nonpurulent, looking drainage 02/20/16: Patient arrives today stating she had an intensely puritic area just below her remaining small wound. When she could no longer stand it she scratched a blister and now has a new area. There may be some swelling of the entire part of her leg ocontact dermatitis however she has not erythema OR PAIN and the wound no longer is itichy 02/28/16; everything seems to be much better except for the small deep wound part of the original area.  This goes down roughly 0.5 cm. There is no subcutaneous tenderness crepitus or purulence. She has been using Iodosorb. She is traveling to [place] next week 03/13/16; the patient was not seen last week as she was traveling in [place]. I had hoped that this would respond to the Iodosorb ointment however the patient arrives today with still a whole of 0.4 cm. When I was doing my initial assessment today moderate amount of frank Green purulent material was expressed from the surface of the wound 03/20/16; culture I did of thick greenish drainage [a large amount for such a small wound] grew MRSA and a few Pseudomonas. I had put her on doxycycline already, I elected to see the wound before considering further antibiotic therapy. Patient reports less pain and less drainage and a smaller wound 03/27/16 wound has acutally closed however she has developed another blister, this has opened into another open areal. The draining wound from last week is closed. Objective Constitutional Vitals Time Taken: 12:52 PM, Height: 65 in, Weight: 134 lbs, BMI: 22.3, Temperature: 97.9 F, Pulse: 68 bpm, Respiratory Rate: 17 breaths/min, Blood Pressure: 137/46 mmHg. Integumentary (Hair, Skin) Wound #1 status is Healed - Epithelialized. Original cause of wound was Trauma. The wound is located on the Right,Lateral Lower Leg. The wound measures 0cm length x 0cm width x 0cm depth; 0cm^2 area and 0cm^3 volume. The wound is limited to skin breakdown. There is no tunneling or undermining noted. There is a none present amount of drainage noted. The wound margin is distinct with the outline attached to the wound base. There is no granulation within the wound bed. There is no necrotic tissue within the wound bed. The periwound skin appearance exhibited: Localized Edema, Dry/Scaly. The periwound skin appearance did not exhibit: Callus, Crepitus, Excoriation, Fluctuance, Friable, Induration,  Rash,  Scarring, Maceration, Moist, Atrophie Blanche, Cyanosis, Ecchymosis, Hemosiderin Staining, Mottled, Pallor, Rubor, Erythema. Periwound temperature was noted as No Abnormality. Wound #4 status is Open. Original cause of wound was Blister. The wound is located on the Right,Lateral Lower Leg. The wound measures 0.5cm length x 0.6cm width x 0.1cm depth; 0.236cm^2 area and 0.024cm^3 volume. The wound is limited to skin breakdown. There is no tunneling or undermining noted. There is a small amount of serosanguineous drainage noted. The wound margin is distinct with the outline attached to the wound base. There is large (67-100%) pink, pale granulation within the wound bed. There is [name], [name] no necrotic tissue within the wound bed. The periwound skin appearance exhibited: Localized Edema, Moist. The periwound skin appearance did not exhibit: Callus, Crepitus, Excoriation, Fluctuance, Friable, Induration, Rash, Scarring, Dry/Scaly, Maceration, Atrophie Blanche, Cyanosis, Ecchymosis, Hemosiderin Staining, Mottled, Pallor, Rubor, Erythema. Periwound temperature was noted as No Abnormality. 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 Follow-up Appointments: Wound #4 Right,Lateral Lower Leg: Return Appointment in 2 weeks. Edema Control: Wound #4 Right,Lateral Lower Leg: Patient to wear own compression stockings #1 I'm going to place Sorbact over the new wound, telfa Storey, [name] ([id number]) Electronic Signature(s) Signed:  03/27/2016 4:24:05 PM By: [name] Ham MD Entered By: [name] [name] on 03/27/2016 13:22:03 Brinton, Katlyn L. ([id number]) -------------------------------------------------------------------------------- SuperBill Details Patient Name: Siebel, Shaylen L. Date of Service: 03/27/2016 Medical Record Patient Account Number: [id number] Number: Treating RN: [name] Gouty, RN, BSN, [name] [name] December 01, 1940 [phone] y.o. Other Clinician: Date of Birth/Sex: Female) Treating Sulaiman Imbert Primary Care Physician/Extender: [name] [name] Physician: Suella Grove in Treatment: 16 Referring Physician: Margarita Rana Diagnosis Coding ICD-10 Codes Code Description [phone] Non-pressure chronic ulcer of right calf with necrosis of muscle I87.2 Venous insufficiency (chronic) (peripheral) Facility Procedures CPT4 Code: [id number] Description: [phone] - WOUND CARE VISIT-LEV 2 EST PT Modifier: Quantity: 1 Physician Procedures CPT4 Code Description: [id number] - WC PHYS LEVEL 2 - EST PT ICD-10 Description Diagnosis L97.213 Non-pressure chronic ulcer of right calf with necro Modifier: sis of muscle Quantity: 1 Electronic Signature(s) Signed: 03/27/2016 4:24:05 PM By: [name] Ham MD Entered By: [name] [name] on 03/27/2016 13:23:46

## 2016-04-09 ENCOUNTER — Encounter: Payer: PPO | Admitting: Surgery

## 2016-04-09 DIAGNOSIS — L97213 Non-pressure chronic ulcer of right calf with necrosis of muscle: Secondary | ICD-10-CM | POA: Diagnosis not present

## 2016-04-09 DIAGNOSIS — I872 Venous insufficiency (chronic) (peripheral): Secondary | ICD-10-CM | POA: Diagnosis not present

## 2016-04-09 DIAGNOSIS — Z872 Personal history of diseases of the skin and subcutaneous tissue: Secondary | ICD-10-CM | POA: Diagnosis not present

## 2016-04-09 DIAGNOSIS — Z09 Encounter for follow-up examination after completed treatment for conditions other than malignant neoplasm: Secondary | ICD-10-CM | POA: Diagnosis not present

## 2016-04-09 NOTE — Progress Notes (Addendum)
DUA, DISHON ([id number]) Visit Report for 04/09/2016 Arrival Information Details Patient Name: [name], [name] Date of Service: 04/09/2016 8:45 AM Medical Record Number: [id number] Patient Account Number: [id number] Date of Birth/Sex: Mar 26, 1941 (75 y.o. Female) Treating RN: Afful, RN, BSN, Velva Harman Primary Care Physician: Margarita Rana Other Clinician: Referring Physician: Margarita Rana Treating Physician/Extender: Frann Rider in Treatment: 59 Visit Information History Since Last Visit All ordered tests and consults were completed: No Patient Arrived: Ambulatory Added or deleted any medications: No Arrival Time: 08:38 Any new allergies or adverse reactions: No Accompanied By: self Had a fall or experienced change in No Transfer Assistance: None activities of daily living that may affect Patient Identification Verified: Yes risk of falls: Secondary Verification Process Yes Signs or symptoms of abuse/neglect since last No Completed: visito Patient Requires Transmission-Based No Hospitalized since last visit: No Precautions: Has Dressing in Place as Prescribed: Yes Patient Has Alerts: No Pain Present Now: No Electronic Signature(s) Signed: 04/09/2016 9:00:10 AM By: Regan Lemming BSN, RN Previous Signature: 04/09/2016 8:38:23 AM Version By: Regan Lemming BSN, RN Entered By: Regan Lemming on 04/09/2016 09:00:10 [name], [name] ([id number]) -------------------------------------------------------------------------------- Clinic Level of Care Assessment Details Patient Name: [name], [name] Date of Service: 04/09/2016 8:45 AM Medical Record Patient Account Number: [id number] Number: Treating RN: Baruch Gouty, RN, BSN, Rita 03/11/1941 [id number] 75 y.o. Other Clinician: Date of Birth/Sex: Female) Treating ROBSON, [place] Primary Care Physician: Margarita Rana Physician/Extender: G Referring Physician: [name] in Treatment: 17 Clinic Level of Care Assessment Items TOOL 4  Quantity Score []  - Use when only an EandM is performed on FOLLOW-UP visit 0 ASSESSMENTS - Nursing Assessment / Reassessment X - Reassessment of Co-morbidities (includes updates in patient status) 1 10 X - Reassessment of Adherence to Treatment Plan 1 5 ASSESSMENTS - Wound and Skin Assessment / Reassessment X - Simple Wound Assessment / Reassessment - one wound 1 5 []  - Complex Wound Assessment / Reassessment - multiple wounds 0 []  - Dermatologic / Skin Assessment (not related to wound area) 0 ASSESSMENTS - Focused Assessment []  - Circumferential Edema Measurements - multi extremities 0 []  - Nutritional Assessment / Counseling / Intervention 0 X - Lower Extremity Assessment (monofilament, tuning fork, pulses) 1 5 []  - Peripheral Arterial Disease Assessment (using hand held doppler) 0 ASSESSMENTS - Ostomy and/or Continence Assessment and Care []  - Incontinence Assessment and Management 0 []  - Ostomy Care Assessment and Management (repouching, etc.) 0 PROCESS - Coordination of Care X - Simple Patient / Family Education for ongoing care 1 15 []  - Complex (extensive) Patient / Family Education for ongoing care 0 []  - Staff obtains Programmer, systems, Records, Test Results / Process Orders 0 []  - Staff telephones HHA, Nursing Homes / Clarify orders / etc 0 Welker, Rick L. ([id number]) []  - Routine Transfer to another Facility (non-emergent condition) 0 []  - Routine Hospital Admission (non-emergent condition) 0 []  - New Admissions / Biomedical engineer / Ordering NPWT, Apligraf, etc. 0 []  - Emergency Hospital Admission (emergent condition) 0 []  - Simple Discharge Coordination 0 []  - Complex (extensive) Discharge Coordination 0 PROCESS - Special Needs []  - Pediatric / Minor Patient Management 0 []  - Isolation Patient Management 0 []  - Hearing / Language / Visual special needs 0 []  - Assessment of Community assistance (transportation, D/C planning, etc.) 0 []  - Additional assistance / Altered  mentation 0 []  - Support Surface(s) Assessment (bed, cushion, seat, etc.) 0 INTERVENTIONS - Wound Cleansing / Measurement []  - Simple Wound Cleansing - one wound  0 []  - Complex Wound Cleansing - multiple wounds 0 X - Wound Imaging (photographs - any number of wounds) 1 5 []  - Wound Tracing (instead of photographs) 0 []  - Simple Wound Measurement - one wound 0 []  - Complex Wound Measurement - multiple wounds 0 INTERVENTIONS - Wound Dressings []  - Small Wound Dressing one or multiple wounds 0 []  - Medium Wound Dressing one or multiple wounds 0 []  - Large Wound Dressing one or multiple wounds 0 []  - Application of Medications - topical 0 []  - Application of Medications - injection 0 [name], [name] INTERVENTIONS - Miscellaneous []  - External ear exam 0 []  - Specimen Collection (cultures, biopsies, blood, body fluids, etc.) 0 []  - Specimen(s) / Culture(s) sent or taken to Lab for analysis 0 []  - Patient Transfer (multiple staff / Civil Service fast streamer / Similar devices) 0 []  - Simple Staple / Suture removal (25 or less) 0 []  - Complex Staple / Suture removal (26 or more) 0 []  - Hypo / Hyperglycemic Management (close monitor of Blood Glucose) 0 []  - Ankle / Brachial Index (ABI) - do not check if billed separately 0 X - Vital Signs 1 5 Has the patient been seen at the hospital within the last three years: Yes Total Score: 50 Level Of Care: New/Established - Level 2 Electronic Signature(s) Signed: 04/09/2016 12:30:15 PM By: Regan Lemming BSN, RN Entered By: Regan Lemming on 04/09/2016 08:48:48 [name], [name] -------------------------------------------------------------------------------- Encounter Discharge Information Details Patient Name: [name], [name] Date of Service: 04/09/2016 8:45 AM Medical Record Number: [id number] Patient Account Number: [id number] Date of Birth/Sex: [birth year] (75 y.o. Female) Treating RN: Baruch Gouty, RN, BSN, Velva Harman Primary Care Physician: Margarita Rana Other Clinician: Referring Physician: Margarita Rana Treating Physician/Extender: Frann Rider in Treatment: 17 Encounter Discharge Information Items Discharge Pain Level: 0 Discharge Condition: Stable Ambulatory Status: Ambulatory Discharge Destination: Home Transportation: Private Auto Accompanied By: self Schedule Follow-up Appointment: No Medication Reconciliation completed and provided to Patient/Care No Kennya Schwenn: Provided on Clinical Summary of Care: 04/09/2016 Form Type Recipient Paper Patient NB Electronic Signature(s) Signed: 04/09/2016 8:52:45 AM By: Regan Lemming BSN, RN Previous Signature: 04/09/2016 8:48:46 AM Version By: Ruthine Dose Entered By: Regan Lemming on 04/09/2016 08:52:44 [name], [name] -------------------------------------------------------------------------------- Lower Extremity Assessment Details Patient Name: Neiss, Cordie L. Date of Service: 04/09/2016 8:45 AM Medical Record Number: [id number] Patient Account Number: [id number] Date of Birth/Sex: August 14, 1941 (74 y.o. Female) Treating RN: Afful, RN, BSN, Velva Harman Primary Care Physician: Margarita Rana Other Clinician: Referring Physician: Margarita Rana Treating Physician/Extender: Frann Rider in Treatment: 17 Vascular Assessment Pulses: Posterior Tibial Dorsalis Pedis Palpable: [Right:Yes] Extremity colors, hair growth, and conditions: Extremity Color: [Right:Mottled] Hair Growth on Extremity: [Right:No] Temperature of Extremity: [Right:Warm] Capillary Refill: [Right:< 3 seconds] Toe Nail Assessment Left: Right: Thick: No Discolored: No Deformed: No Improper Length and Hygiene: No Electronic Signature(s) Signed: 04/09/2016 9:02:02 AM By: Regan Lemming BSN, RN Entered By: Regan Lemming on 04/09/2016 09:02:02 [name], [name] -------------------------------------------------------------------------------- Multi Wound Chart Details Patient Name: [name],  [name] Date of Service: 04/09/2016 8:45 AM Medical Record Patient Account Number: [id number] Number: Treating RN: Baruch Gouty, RN, BSN, Rita November 29, 1940 [phone] y.o. Other Clinician: Date of Birth/Sex: Female) Treating ROBSON, [place] Primary Care Physician: Margarita Rana Physician/Extender: G Referring Physician: [name] in Treatment: 17 Vital Signs Height(in): 65 Pulse(bpm): 68 Weight(lbs): 134 Blood Pressure 124/76 (mmHg): Body Mass Index(BMI): 22 Temperature(F): 97.6 Respiratory Rate 16 (breaths/min): Photos: [4:No Photos] [N/A:N/A] Wound Location: [4:Right Lower  Leg - Lateral] [N/A:N/A] Wounding Event: [4:Blister] [N/A:N/A] Primary Etiology: [4:Skin Tear] [N/A:N/A] Comorbid History: [4:Cataracts, Chronic Obstructive Pulmonary Disease (COPD), Hypertension, Osteoarthritis, Neuropathy, Confinement Anxiety] [N/A:N/A] Date Acquired: [4:03/26/2016] [N/A:N/A] Weeks of Treatment: [4:1] [N/A:N/A] Wound Status: [4:Open] [N/A:N/A] Measurements L x W x D 0x0x0 [N/A:N/A] (cm) Area (cm) : [4:0] [N/A:N/A] Volume (cm) : [4:0] [N/A:N/A] % Reduction in Area: [4:100.00%] [N/A:N/A] % Reduction in Volume: 100.00% [N/A:N/A] Classification: [4:Partial Thickness] [N/A:N/A] Exudate Amount: [4:None Present] [N/A:N/A] Wound Margin: [4:Distinct, outline attached] [N/A:N/A] Granulation Amount: [4:None Present (0%)] [N/A:N/A] Necrotic Amount: [4:None Present (0%)] [N/A:N/A] Exposed Structures: [4:Fascia: No Fat: No Tendon: No] [N/A:N/A] Muscle: No Joint: No Bone: No Limited to Skin Breakdown Epithelialization: Large (67-100%) N/A N/A Periwound Skin Texture: Edema: Yes N/A N/A Excoriation: No Induration: No Callus: No Crepitus: No Fluctuance: No Friable: No Rash: No Scarring: No Periwound Skin Dry/Scaly: Yes N/A N/A Moisture: Maceration: No Moist: No Periwound Skin Color: Atrophie Blanche: No N/A N/A Cyanosis: No Ecchymosis: No Erythema: No Hemosiderin  Staining: No Mottled: No Pallor: No Rubor: No Temperature: No Abnormality N/A N/A Tenderness on No N/A N/A Palpation: Wound Preparation: Ulcer Cleansing: N/A N/A Rinsed/Irrigated with Saline Topical Anesthetic Applied: None Treatment Notes Electronic Signature(s) Signed: 04/09/2016 12:30:15 PM By: Regan Lemming BSN, RN Entered By: Regan Lemming on 04/09/2016 08:46:47 [name], [name] ([id number]) -------------------------------------------------------------------------------- [place] Details Patient Name: [name], [name] Date of Service: 04/09/2016 8:45 AM Medical Record Patient Account Number: [id number] Number: Treating RN: Baruch Gouty, RN, BSN, Rita February 21, 1941 [phone] y.o. Other Clinician: Date of Birth/Sex: Female) Treating ROBSON, MICHAEL Primary Care Physician: Margarita Rana Physician/Extender: G Referring Physician: [name] in Treatment: 17 Active Inactive Electronic Signature(s) Signed: 04/09/2016 12:30:15 PM By: Regan Lemming BSN, RN Entered By: Regan Lemming on 04/09/2016 08:46:27 [name], Dillard. ([id number]) -------------------------------------------------------------------------------- Pain Assessment Details Patient Name: Kramar, [name] Date of Service: 04/09/2016 8:45 AM Medical Record Number: [id number] Patient Account Number: [id number] Date of Birth/Sex: 1941/07/21 (74 y.o. Female) Treating RN: Baruch Gouty, RN, BSN, Velva Harman Primary Care Physician: Margarita Rana Other Clinician: Referring Physician: Margarita Rana Treating Physician/Extender: Frann Rider in Treatment: 17 Active Problems Location of Pain Severity and Description of Pain Patient Has Paino No Site Locations With Dressing Change: No Pain Management and Medication Current Pain Management: Electronic Signature(s) Signed: 04/09/2016 9:00:23 AM By: Regan Lemming BSN, RN Previous Signature: 04/09/2016 8:38:30 AM Version By: Regan Lemming BSN, RN Entered By: Regan Lemming on  04/09/2016 09:00:23 [place], [name] ([id number]) -------------------------------------------------------------------------------- Patient/Caregiver Education Details Patient Name: Knick, Nettie L. Date of Service: 04/09/2016 8:45 AM Medical Record Patient Account Number: [id number] Number: Treating RN: Baruch Gouty, RN, BSN, Rita [birth year] [phone] y.o. Other Clinician: Date of Birth/Gender: Female) Treating ROBSON, MICHAEL Primary Care Physician: Margarita Rana Physician/Extender: G Referring Physician: [name] in Treatment: 17 Education Assessment Education Provided To: Patient Education Topics Provided Electronic Signature(s) Signed: 04/09/2016 12:30:15 PM By: Regan Lemming BSN, RN Entered By: Regan Lemming on 04/09/2016 08:53:05 Krichbaum, Marshe L. ([id number]) -------------------------------------------------------------------------------- Wound Assessment Details Patient Name: Rugg, Aileene L. Date of Service: 04/09/2016 8:45 AM Medical Record Patient Account Number: [id number] Number: Treating RN: Baruch Gouty, RN, BSN, Rita 1941-05-31 [phone] y.o. Other Clinician: Date of Birth/Sex: Female) Treating ROBSON, MICHAEL Primary Care Physician: Margarita Rana Physician/Extender: G Referring Physician: [name] in Treatment: 17 Wound Status Wound Number: 4 Primary Skin Tear Etiology: Wound Location: Right Lower Leg - Lateral Wound Open Wounding Event: Blister Status: Date Acquired: 03/26/2016 Comorbid Cataracts, Chronic Obstructive Weeks Of Treatment:  1 History: Pulmonary Disease (COPD), Clustered Wound: No Hypertension, Osteoarthritis, Neuropathy, Confinement Anxiety Photos Photo Uploaded By: Regan Lemming on 04/09/2016 12:28:20 Wound Measurements Length: (cm) 0 % Reductio Width: (cm) 0 % Reductio Depth: (cm) 0 Epithelial Area: (cm) 0 Tunneling Volume: (cm) 0 Undermini n in Area: 100% n in Volume: 100% ization: Large (67-100%) : No ng: No Wound  Description Classification: Partial Thickness Wound Margin: Distinct, outline attached Exudate Amount: None Present Foul Odor After Cleansing: No Wound Bed Granulation Amount: None Present (0%) Exposed Structure Necrotic Amount: None Present (0%) Fascia Exposed: No Fat Layer Exposed: No Hurrell, Tulsi L. ([id number]) Tendon Exposed: No Muscle Exposed: No Joint Exposed: No Bone Exposed: No Limited to Skin Breakdown Periwound Skin Texture Texture Color No Abnormalities Noted: No No Abnormalities Noted: No Callus: No Atrophie Blanche: No Crepitus: No Cyanosis: No Excoriation: No Ecchymosis: No Fluctuance: No Erythema: No Friable: No Hemosiderin Staining: No Induration: No Mottled: No Localized Edema: Yes Pallor: No Rash: No Rubor: No Scarring: No Temperature / Pain Moisture Temperature: No Abnormality No Abnormalities Noted: No Dry / Scaly: Yes Maceration: No Moist: No Wound Preparation Ulcer Cleansing: Rinsed/Irrigated with Saline Topical Anesthetic Applied: None Electronic Signature(s) Signed: 04/09/2016 12:30:15 PM By: Regan Lemming BSN, RN Entered By: Regan Lemming on 04/09/2016 08:45:59 Pospisil, Kamauri L. ([id number]) -------------------------------------------------------------------------------- Vitals Details Patient Name: Knick, Ahnesti L. Date of Service: 04/09/2016 8:45 AM Medical Record Number: [id number] Patient Account Number: [id number] Date of Birth/Sex: Mar 16, 1941 (74 y.o. Female) Treating RN: Afful, RN, BSN, [place] Primary Care Physician: Margarita Rana Other Clinician: Referring Physician: Margarita Rana Treating Physician/Extender: Frann Rider in Treatment: 17 Vital Signs Time Taken: 08:41 Temperature (F): 97.6 Height (in): 65 Pulse (bpm): 68 Weight (lbs): 134 Respiratory Rate (breaths/min): 16 Body Mass Index (BMI): 22.3 Blood Pressure (mmHg): 124/76 Reference Range: 80 - 120 mg / dl Electronic Signature(s) Signed: 04/09/2016 9:00:33 AM  By: Regan Lemming BSN, RN Entered By: Regan Lemming on 04/09/2016 09:00:33

## 2016-04-25 NOTE — Progress Notes (Signed)
ROHINI, CASASANTA ([id number]) Visit Report for 04/09/2016 Chief Complaint Document Details Patient Name: [name], [name] Date of Service: 04/09/2016 8:45 AM Medical Record Patient Account Number: [id number] Number: Treating RN: Baruch Gouty, RN, BSN, Rita 1941/07/18 [phone] y.o. Other Clinician: Date of Birth/Sex: Female) Treating ROBSON, MICHAEL Primary Care Physician/Extender: Lonn Georgia Physician: Referring Physician: [name] in Treatment: 17 Information Obtained from: Patient Chief Complaint Patient is here for a chronic wound on her right anterior lateral leg Electronic Signature(s) Signed: 04/09/2016 8:48:51 AM By: Christin Fudge MD, FACS Signed: 04/24/2016 6:00:53 PM By: Linton Ham MD Entered By: Christin Fudge on 04/09/2016 08:48:51 Marton, Raigan L. ([id number]) -------------------------------------------------------------------------------- HPI Details Patient Name: [name], [name] Date of Service: 04/09/2016 8:45 AM Medical Record Patient Account Number: [id number] Number: Treating RN: Baruch Gouty, RN, BSN, Rita 1941/07/14 [phone] y.o. Other Clinician: Date of Birth/Sex: Female) Treating ROBSON, MICHAEL Primary Care Physician/Extender: Lonn Georgia Physician: Referring Physician: [name] in Treatment: 17 History of Present Illness HPI Description: 12/06/15; this is a patient who has no prior wound history and is not a diabetic. In February she was getting into her family truck and the wind pushed the door against the outer aspect of her right lower leg. I believe there was initially some swelling but not clearly a hematoma. 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 Has been using topical antibiotics on this. 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 There is already been some improvement. She has no prior history of PAD and no major history of venous insufficiency. 12/13/15; wound appears healthy,  using prisma 12/20/15; patient arrives complaining of increasing pain around the wound. She also had a blister under the wound. 12/26/15; the patient arrives with the blister last week fully excised and healed however she has another floppy flaccid blister just below her wound. The wound does not appear to be infected. The cause of this is not really clear, he does walk 3 miles a day but doesn't think that the area with a blister is currently rubs the bandage at all. She said she felt a stinging and then noticed this on the weekend. She will complete the antibiotics I gave hertomorrow 01/02/16; the patient's original wound in the right lower leg requires debridement of surface slough and surrounding eschar. The base of this then looks healthy. The area that was a blister underneath this of uncertain etiology also appears to be better. Culture of this blister from last week was negative 01/09/16; the patient's original wound on the lower right lateral calf requires debridement of surface slough and surrounding eschar. Base of this looks healthy once again. She has another recurrent blister underneath this wound. This is his second one of these. I cultured the last one which was negative. The patient is very active and I wonder whether this has something to do with this there is no evidence of surrounding cellulitis 01/16/16; the patient's original wound on the lower right calf again requires debridement. Her subsequent wound below this which was a blister last week also has a fibrinous surface slough that requires debridement. Again the etiology of these wounds isn't completely clear to me. She does not have PAD, her ABI in this clinic was 1.17 on the right. She may have mild venous reflux but certainly does not have frank venous inflammation. She is very active telling me she walked 5 miles this weekend but states that she did not have anything that  showed of caused pressure friction on the wound  area. 01/23/16 both the patient's wounds appear to be improved. Her subsequent wound which was a blister also appears to be smaller. 01/30/16; the inferior wound which was a secondary wound has resolved. Her original wound appears better. Still using Iodoflex 02/06/16; surprisingly the patient's wound is almost totally closed over except for a small probing, I'll on the medial aspect of the circumference of the wound. The issue here is she has never had tunneling in this, most of this was always superficial. This has a clear, nonpurulent, looking drainage 02/20/16: Patient arrives today stating she had an intensely puritic area just below her remaining small Marques, [name] ([id number]) wound. When she could no longer stand it she scratched a blister and now has a new area. There may be some swelling of the entire part of her leg ocontact dermatitis however she has not erythema OR PAIN and the wound no longer is itichy 02/28/16; everything seems to be much better except for the small deep wound part of the original area. This goes down roughly 0.5 cm. There is no subcutaneous tenderness crepitus or purulence. She has been using Iodosorb. She is traveling to [place] next week 03/13/16; the patient was not seen last week as she was traveling in [place]. I had hoped that this would respond to the Iodosorb ointment however the patient arrives today with still a whole of 0.4 cm. When I was doing my initial assessment today moderate amount of frank Green purulent material was expressed from the surface of the wound 03/20/16; culture I did of thick greenish drainage [a large amount for such a small wound] grew MRSA and a few Pseudomonas. I had put her on doxycycline already, I elected to see the wound before considering further antibiotic therapy. Patient reports less pain and less drainage and a smaller wound 03/27/16 wound has acutally closed however she has developed another blister, this has  opened into another open areal. The draining wound from last week is closed. Electronic Signature(s) Signed: 04/09/2016 8:48:57 AM By: Christin Fudge MD, FACS Signed: 04/24/2016 6:00:53 PM By: Linton Ham MD Entered By: Christin Fudge on 04/09/2016 08:48:57 Dorner, Valeska Carlean Jews ([id number]) -------------------------------------------------------------------------------- Physical Exam Details Patient Name: Vanepps, Fabiana L. Date of Service: 04/09/2016 8:45 AM Medical Record Patient Account Number: [id number] Number: Treating RN: Baruch Gouty, RN, BSN, Rita 15-Aug-1941 [phone] y.o. Other Clinician: Date of Birth/Sex: Female) Treating ROBSON, MICHAEL Primary Care Physician/Extender: Lonn Georgia Physician: Referring Physician: [name] in Treatment: 17 Constitutional . Pulse regular. Respirations normal and unlabored. Afebrile. . Eyes Nonicteric. Reactive to light. Ears, Nose, Mouth, and Throat Lips, teeth, and gums WNL.Marland Kitchen Moist mucosa without lesions. Neck supple and nontender. No palpable supraclavicular or cervical adenopathy. Normal sized without goiter. Respiratory WNL. No retractions.. Breath sounds WNL, No rubs, rales, rhonchi, or wheeze.. Cardiovascular Heart rhythm and rate regular, no murmur or gallop.. Pedal Pulses WNL. No clubbing, cyanosis or edema. Chest Breasts symmetical and no nipple discharge.. Breast tissue WNL, no masses, lumps, or tenderness.. Lymphatic No adneopathy. No adenopathy. No adenopathy. Musculoskeletal Adexa without tenderness or enlargement.. Digits and nails w/o clubbing, cyanosis, infection, petechiae, ischemia, or inflammatory conditions.. Integumentary (Hair, Skin) No suspicious lesions. No crepitus or fluctuance. No peri-wound warmth or erythema. No masses.Marland Kitchen Psychiatric Judgement and insight Intact.. No evidence of depression, anxiety, or agitation.. Notes the wound on the lateral part of the right upper ankle is completely healed  and there are no blisters open  areas today. Electronic Signature(s) Signed: 04/09/2016 8:49:22 AM By: Christin Fudge MD, FACS Signed: 04/24/2016 6:00:53 PM By: Linton Ham MD Entered By: Christin Fudge on 04/09/2016 08:49:22 Nangle, [name] ([id number]) Krogh, Carisma LMarland Kitchen ([id number]) -------------------------------------------------------------------------------- Physician Orders Details Patient Name: Boal, Jahnasia L. Date of Service: 04/09/2016 8:45 AM Medical Record Patient Account Number: [id number] Number: Treating RN: Baruch Gouty, RN, BSN, Rita 1941-06-26 [phone] y.o. Other Clinician: Date of Birth/Sex: Female) Treating ROBSON, MICHAEL Primary Care Physician/Extender: Lonn Georgia Physician: Referring Physician: [name] in Treatment: 17 Verbal / Phone Orders: Yes Clinician: Afful, RN, BSN, Velva Harman Read Back and Verified: Yes Diagnosis Coding Discharge From [facility] Services o Discharge from [facility] Completed Electronic Signature(s) Signed: 04/09/2016 12:30:15 PM By: Regan Lemming BSN, RN Signed: 04/24/2016 6:00:53 PM By: Linton Ham MD Entered By: Regan Lemming on 04/09/2016 08:48:09 Camilo, Naliah L. ([id number]) -------------------------------------------------------------------------------- Problem List Details Patient Name: Carbary, Sayra L. Date of Service: 04/09/2016 8:45 AM Medical Record Patient Account Number: [id number] Number: Treating RN: Baruch Gouty, RN, BSN, Rita 1941/06/05 [phone] y.o. 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 ([id number]) -------------------------------------------------------------------------------- Progress Note Details Patient Name: Kolodziej, Rachel L. Date of Service: 04/09/2016 8:45 AM Medical Record Patient Account Number: [id number] Number: Treating RN: Baruch Gouty, RN, BSN, Rita 1941-07-24 [phone] y.o. Other Clinician: Date of Birth/Sex: Female) Treating ROBSON, MICHAEL Primary Care Physician/Extender: Lonn Georgia Physician: Referring Physician: [name] in Treatment: 17 Subjective Chief Complaint Information obtained from Patient Patient is here for a chronic wound on her right anterior lateral leg History of Present Illness (HPI) 12/06/15; this is a patient who has no prior wound history and is not a diabetic. In February she was getting into her family truck and the wind pushed the door against the outer aspect of her right lower leg. I believe there was initially some swelling but not clearly a hematoma. 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 Has been using topical antibiotics on this. 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 There is already been some improvement. She has no prior history of PAD and no major history of venous insufficiency. 12/13/15; wound appears healthy, using prisma 12/20/15; patient arrives complaining of increasing pain around the wound. She also had a blister under the wound. 12/26/15; the patient arrives with the blister last week fully excised and healed however she has another floppy flaccid blister just below her wound. The wound does not appear to be infected. The cause of this is not really clear, he does walk 3 miles a day but doesn't think that the area with a blister is currently rubs  the bandage at all. She said she felt a stinging and then noticed this on the weekend. She will complete the antibiotics I gave hertomorrow 01/02/16; the patient's original wound in the right lower leg requires debridement of surface slough and surrounding eschar. The base of this then looks healthy. The area that was a blister underneath this of uncertain etiology also  appears to be better. Culture of this blister from last week was negative 01/09/16; the patient's original wound on the lower right lateral calf requires debridement of surface slough and surrounding eschar. Base of this looks healthy once again. She has another recurrent blister underneath this wound. This is his second one of these. I cultured the last one which was negative. The patient is very active and I wonder whether this has something to do with this there is no evidence of surrounding cellulitis 01/16/16; the patient's original wound on the lower right calf again requires debridement. Her subsequent wound below this which was a blister last week also has a fibrinous surface slough that requires debridement. Again the etiology of these wounds isn't completely clear to me. She does not have PAD, her ABI in this clinic was 1.17 on the right. She may have mild venous reflux but certainly does not have frank venous inflammation. She is very active telling me she walked 5 miles this weekend but states that she did not have anything that showed of caused pressure friction on the wound area. 01/23/16 both the patient's wounds appear to be improved. Her subsequent wound which was a blister also appears to be smaller. Goodwyn, Rima L. ([id number]) 01/30/16; the inferior wound which was a secondary wound has resolved. Her original wound appears better. Still using Iodoflex 02/06/16; surprisingly the patient's wound is almost totally closed over except for a small probing, I'll on the medial aspect of the circumference of the wound. The  issue here is she has never had tunneling in this, most of this was always superficial. This has a clear, nonpurulent, looking drainage 02/20/16: Patient arrives today stating she had an intensely puritic area just below her remaining small wound. When she could no longer stand it she scratched a blister and now has a new area. There may be some swelling of the entire part of her leg ocontact dermatitis however she has not erythema OR PAIN and the wound no longer is itichy 02/28/16; everything seems to be much better except for the small deep wound part of the original area. This goes down roughly 0.5 cm. There is no subcutaneous tenderness crepitus or purulence. She has been using Iodosorb. She is traveling to [place] next week 03/13/16; the patient was not seen last week as she was traveling in [place]. I had hoped that this would respond to the Iodosorb ointment however the patient arrives today with still a whole of 0.4 cm. When I was doing my initial assessment today moderate amount of frank Green purulent material was expressed from the surface of the wound 03/20/16; culture I did of thick greenish drainage [a large amount for such a small wound] grew MRSA and a few Pseudomonas. I had put her on doxycycline already, I elected to see the wound before considering further antibiotic therapy. Patient reports less pain and less drainage and a smaller wound 03/27/16 wound has acutally closed however she has developed another blister, this has opened into another open areal. The draining wound from last week is closed. Objective Constitutional Pulse regular. Respirations normal and unlabored. Afebrile. Vitals Time Taken: 8:41 AM, Height: 65 in, Weight: 134 lbs, BMI: 22.3, Temperature: 97.6 F, Pulse: 68 bpm, Respiratory Rate: 16 breaths/min, Blood Pressure: 124/76 mmHg. Eyes Nonicteric. Reactive to light. Ears, Nose, Mouth, and Throat Lips, teeth, and gums WNL.Marland Kitchen Moist mucosa without  lesions. Neck supple and nontender. No palpable supraclavicular or cervical adenopathy. Normal sized without goiter. Respiratory WNL. No  retractions.. Breath sounds WNL, No rubs, rales, rhonchi, or wheeze.. Cardiovascular Heart rhythm and rate regular, no murmur or gallop.. Pedal Pulses WNL. No clubbing, cyanosis or edema. Stofer, Arasely L. ([id number]) Chest Breasts symmetical and no nipple discharge.. Breast tissue WNL, no masses, lumps, or tenderness.. Lymphatic No adneopathy. No adenopathy. No adenopathy. Musculoskeletal Adexa without tenderness or enlargement.. Digits and nails w/o clubbing, cyanosis, infection, petechiae, ischemia, or inflammatory conditions.Marland Kitchen Psychiatric Judgement and insight Intact.. No evidence of depression, anxiety, or agitation.. General Notes: the wound on the lateral part of the right upper ankle is completely healed and there are no blisters open areas today. Integumentary (Hair, Skin) No suspicious lesions. No crepitus or fluctuance. No peri-wound warmth or erythema. No masses.. Wound #4 status is Open. Original cause of wound was Blister. The wound is located on the Right,Lateral Lower Leg. The wound measures 0cm length x 0cm width x 0cm depth; 0cm^2 area and 0cm^3 volume. The wound is limited to skin breakdown. There is no tunneling or undermining noted. There is a none present amount of drainage noted. The wound margin is distinct with the outline attached to the wound base. There is no granulation within the wound bed. There is no necrotic tissue within the wound bed. The periwound skin appearance exhibited: Localized Edema, Dry/Scaly. The periwound skin appearance did not exhibit: Callus, Crepitus, Excoriation, Fluctuance, Friable, Induration, Rash, Scarring, Maceration, Moist, Atrophie Blanche, Cyanosis, Ecchymosis, Hemosiderin Staining, Mottled, Pallor, Rubor, Erythema. Periwound temperature was noted as No Abnormality. 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 She says she will be compliant. She is discharge from the wound care services and will be seen back as needed Electronic Signature(s) Signed: 04/09/2016 8:50:23 AM By: Christin Fudge MD, FACS Signed: 04/24/2016 6:00:53 PM By: Linton Ham MD Entered By: Christin Fudge on 04/09/2016 08:50:23 Prats, Tatyana L. ([id number]) -------------------------------------------------------------------------------- SuperBill Details Patient Name: Nudelman, Gaye L. Date of Service: 04/09/2016 Medical Record Patient Account Number: [id number] Number: Treating RN: Baruch Gouty, RN, BSN, Rita 25-Nov-1940 [phone] y.o. Other Clinician: Date of Birth/Sex: Female) Treating ROBSON, MICHAEL Primary Care Physician/Extender: Lonn Georgia Physician: Suella Grove in Treatment: 17 Referring Physician: Margarita Rana Diagnosis Coding ICD-10 Codes Code Description [phone] Non-pressure chronic ulcer of right calf with necrosis of muscle I87.2 Venous insufficiency (chronic) (peripheral) Facility Procedures CPT4 Code: [id number] Description: [phone] - WOUND CARE VISIT-LEV 2 EST PT Modifier: Quantity: 1 Physician Procedures CPT4 Code Description: [id number] - WC PHYS LEVEL 2 - EST PT ICD-10 Description Diagnosis L97.213 Non-pressure chronic ulcer of right calf with necro I87.2 Venous insufficiency (chronic) (peripheral) Modifier: sis of muscle Quantity: 1 Electronic Signature(s) Signed: 04/09/2016 8:50:57 AM By: Christin Fudge MD, FACS Signed: 04/24/2016 6:00:53 PM By: Linton Ham MD Entered By: Christin Fudge on 04/09/2016 08:50:57

## 2016-04-26 ENCOUNTER — Ambulatory Visit (INDEPENDENT_AMBULATORY_CARE_PROVIDER_SITE_OTHER): Payer: PPO | Admitting: Family Medicine

## 2016-04-26 ENCOUNTER — Encounter: Payer: Self-pay | Admitting: Family Medicine

## 2016-04-26 ENCOUNTER — Ambulatory Visit
Admission: RE | Admit: 2016-04-26 | Discharge: 2016-04-26 | Disposition: A | Discharge status: Home / Self Care | Payer: PPO | Source: Ambulatory Visit | Attending: Family Medicine | Admitting: Family Medicine

## 2016-04-26 VITALS — BP 138/64 | HR 64 | Temp 97.6°F | Resp 14 | Wt 137.0 lb

## 2016-04-26 DIAGNOSIS — M1712 Unilateral primary osteoarthritis, left knee: Secondary | ICD-10-CM | POA: Diagnosis not present

## 2016-04-26 DIAGNOSIS — L259 Unspecified contact dermatitis, unspecified cause: Secondary | ICD-10-CM

## 2016-04-26 DIAGNOSIS — M25562 Pain in left knee: Secondary | ICD-10-CM | POA: Insufficient documentation

## 2016-04-26 DIAGNOSIS — M179 Osteoarthritis of knee, unspecified: Secondary | ICD-10-CM | POA: Diagnosis not present

## 2016-04-26 MED ORDER — TRIAMCINOLONE ACETONIDE 0.1 % EX CREA: 1.0000 "application " | TOPICAL_CREAM | Freq: Two times a day (BID) | CUTANEOUS | 0 refills | Status: DC

## 2016-04-26 NOTE — Progress Notes (Signed)
Patient: [name] [name] Female    DOB: [birth year]   75 y.o.   MRN: [id number] Visit Date: 04/26/2016  Today's Provider: Vernie Murders, PA   Chief Complaint  Patient presents with  . Rash   Subjective:    Rash  This is a new problem. The current episode started in the past 7 days. The problem has been gradually worsening since onset. The affected locations include the left lower leg and right lower leg. The rash is characterized by blistering, draining, itchiness and redness. Associated with: spandex compression stockings.   Past Medical History:  Diagnosis Date  . Allergy   . Asthma   . COPD (chronic obstructive pulmonary disease) ([place])   . Dysrhythmia    PVC's  . GERD (gastroesophageal reflux disease)   . Hyperlipidemia   . Hypothyroidism   . Pneumonia   . PVC (premature ventricular contraction)   . Shortness of breath dyspnea   . Thyroid disease    Past Surgical History:  Procedure Laterality Date  . abdominal surgery    . APPENDECTOMY    . BLEPHAROPLASTY Bilateral 11/2014   left eyelid 08/03/2015 Dr. Vickki Muff  . BREAST CYST ASPIRATION Left   . CHOLECYSTECTOMY  03/2004  . [place]   fertility testing  . EYE SURGERY Bilateral 2015   cataract excision  . KNEE SURGERY Right    knee arthroscopy-2000; Total knee replacement-2002  . LAPAROTOMY  1969   fertility testing  . patelectomy Right   . Removal of medical meniscus  1972   Family History  Problem Relation Age of Onset  . Diabetes Mother   . Hypertension Mother   . Congestive Heart Failure Mother   . Heart disease Father   . Hypertension Father   . Hyperlipidemia Sister   . Heart disease Sister   . Paget's disease of bone Sister   . Hyperthyroidism Sister   . Cancer Sister    Allergies  Allergen Reactions  . Codeine   . Diphenhydramine     Decreased BP and pulse rate  . Montelukast Sodium     Insomnia  . Morphine Sulfate   . Nsaids     Can take Meloxicam, Naproxen  .  Other Nausea And Vomiting    opiates  . Tramadol     Mental status change  . Celecoxib Rash    Epigastric pain  . Rofecoxib Rash    Epigastric pain   Previous Medications   ACETAMINOPHEN 500 MG COAPSULE    Take 1 capsule by mouth every 6 (six) hours as needed.    ALBUTEROL (VENTOLIN HFA) 108 (90 BASE) MCG/ACT INHALER    Inhale 1-2 puffs into the lungs every 4 (four) hours as needed for wheezing or shortness of breath. Reported on 12/04/2015   ASPIRIN 81 MG TABLET    Take 81 mg by mouth daily.    FEXOFENADINE (ALLEGRA) 180 MG TABLET    Take 1 tablet by mouth daily.   FLECAINIDE (TAMBOCOR) 50 MG TABLET    Take 50 mg by mouth once.    FLECAINIDE (TAMBOCOR) 50 MG TABLET    Take 1 tablet by mouth 1 day or 1 dose.   FLUTICASONE-SALMETEROL (ADVAIR DISKUS) 250-50 MCG/DOSE AEPB    Inhale 1 puff into the lungs 2 (two) times daily. 1 puff BID daily   LEVOTHYROXINE (SYNTHROID, LEVOTHROID) 75 MCG TABLET    Take 1 tablet (75 mcg total) by mouth daily.   MELOXICAM (MOBIC) 7.5 MG  TABLET    Take 1 tablet by mouth 1 day or 1 dose.   MULTIPLE VITAMIN PO    Take 1 tablet by mouth daily.   OMEPRAZOLE (PRILOSEC) 20 MG CAPSULE    Take 1 capsule by mouth as needed. Reported on 12/04/2015   SENNOSIDES-DOCUSATE SODIUM (SENOKOT-S) 8.6-50 MG TABLET    Take 1 tablet by mouth daily.   TIOTROPIUM (SPIRIVA HANDIHALER) 18 MCG INHALATION CAPSULE    Place 1 capsule (18 mcg total) into inhaler and inhale daily.   TRIAMCINOLONE (NASACORT ALLERGY 24HR) 55 MCG/ACT AERO NASAL INHALER    Place 2 sprays into the nose daily.    Review of Systems  Constitutional: Negative.   Respiratory: Negative.   Cardiovascular: Negative.   Skin: Positive for rash.    Social History  Substance Use Topics  . Smoking status: Former Smoker    Quit date: 08/25/1998  . Smokeless tobacco: Never Used  . Alcohol use 1.2 oz/week    2 Glasses of wine per week     Comment: occasionally   Objective:   BP 138/64 (BP Location: Right Arm, Patient  Position: Sitting, Cuff Size: Normal)   Pulse 64   Temp 97.6 F (36.4 C) (Oral)   Resp 14   Wt 137 lb (62.1 kg)   BMI 24.86 kg/m   Physical Exam  Constitutional: She is oriented to person, place, and time. She appears well-developed and well-nourished. No distress.  HENT:  Head: Normocephalic and atraumatic.  Right Ear: Hearing normal.  Left Ear: Hearing normal.  Nose: Nose normal.  Eyes: Conjunctivae and lids are normal. Right eye exhibits no discharge. Left eye exhibits no discharge. No scleral icterus.  Neck: Neck supple.  Cardiovascular: Normal rate and regular rhythm.   Pulmonary/Chest: Effort normal and breath sounds normal. No respiratory distress.  Musculoskeletal: Normal range of motion. She exhibits tenderness.  Left lateral knee joint pain with crepitus when standing or walking. 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 No swelling today or redness.  Neurological: She is alert and oriented to person, place, and time.  Skin: Skin is intact. Rash noted. No lesion noted.  Pruritic rash with blisters and some erythema around the right lower leg at the top of her knee-high compression stockings.  Psychiatric: She has a normal mood and affect. Her speech is normal and behavior is normal. Thought content normal.      Assessment & Plan:     1. Contact dermatitis Developed itchy rash at the top of there right knee high support hose with slight reaction on the left over the past week. May use Kenalog cream for itching and rash. Suspect contact dermatitis with irritation from support hose or allergic reaction to spandex. Hold use of support hose and wash in cold water. Recheck prn. - triamcinolone cream (KENALOG) 0.1 %; Apply 1 application topically 2 (two) times daily.  Dispense: 30 g; Refill: 0  2. Left knee pain Worsening of discomfort and crepitus in the left knee. Pain primarily along the joint line of the lateral knee. Will get x-ray evaluation  and schedule orthopedic referral. 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 - Ambulatory referral to Orthopedic Surgery - DG Knee Complete 4 Views Left

## 2016-04-30 ENCOUNTER — Telehealth: Payer: Self-pay

## 2016-04-30 NOTE — Telephone Encounter (Signed)
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 Proceed with orthopedic referral.

## 2016-04-30 NOTE — Telephone Encounter (Signed)
Pt advised as below. Agrees to see ortho. Renaldo Fiddler, CMA

## 2016-05-02 ENCOUNTER — Encounter: Payer: Self-pay | Admitting: Physician Assistant

## 2016-05-02 ENCOUNTER — Ambulatory Visit (INDEPENDENT_AMBULATORY_CARE_PROVIDER_SITE_OTHER): Payer: PPO | Admitting: Physician Assistant

## 2016-05-02 VITALS — BP 112/68 | HR 72 | Resp 16 | Wt 136.0 lb

## 2016-05-02 DIAGNOSIS — N3000 Acute cystitis without hematuria: Secondary | ICD-10-CM

## 2016-05-02 MED ORDER — SULFAMETHOXAZOLE-TRIMETHOPRIM 800-160 MG PO TABS: 1.0000 | ORAL_TABLET | Freq: Two times a day (BID) | ORAL | 0 refills | Status: DC

## 2016-05-02 NOTE — Progress Notes (Signed)
Patient: [name] [name] Female    DOB: 06-05-1941   75 y.o.   MRN: [id number] Visit Date: 05/02/2016  Today's Provider: Mar Daring, PA-C   Chief Complaint  Patient presents with  . Urinary Tract Infection    about 1 week.    Subjective:    HPI Patient comes in today c/o a possible UTI. Patient reports that she has had symptoms for about 1 week. Patient reports that she has urinary frequency, urgency, and burning on urination. Patient denies any lower back pain, abdominal pain, or fever. She has not been taking anything OTC for symptoms.     Allergies  Allergen Reactions  . Codeine   . Diphenhydramine     Decreased BP and pulse rate  . Montelukast Sodium     Insomnia  . Morphine Sulfate   . Nsaids     Can take Meloxicam, Naproxen  . Other Nausea And Vomiting    opiates  . Tramadol     Mental status change  . Celecoxib Rash    Epigastric pain  . Rofecoxib Rash    Epigastric pain     Current Outpatient Prescriptions:  .  Acetaminophen 500 MG coapsule, Take 1 capsule by mouth every 6 (six) hours as needed. , Disp: , Rfl:  .  albuterol (VENTOLIN HFA) 108 (90 Base) MCG/ACT inhaler, Inhale 1-2 puffs into the lungs every 4 (four) hours as needed for wheezing or shortness of breath. Reported on 12/04/2015, Disp: 1 Inhaler, Rfl: 12 .  aspirin 81 MG tablet, Take 81 mg by mouth daily. , Disp: , Rfl:  .  fexofenadine (ALLEGRA) 180 MG tablet, Take 1 tablet by mouth daily., Disp: , Rfl:  .  flecainide (TAMBOCOR) 50 MG tablet, Take 1 tablet by mouth 1 day or 1 dose., Disp: , Rfl:  .  Fluticasone-Salmeterol (ADVAIR DISKUS) 250-50 MCG/DOSE AEPB, Inhale 1 puff into the lungs 2 (two) times daily. 1 puff BID daily, Disp: 180 each, Rfl: 3 .  levothyroxine (SYNTHROID, LEVOTHROID) 75 MCG tablet, Take 1 tablet (75 mcg total) by mouth daily., Disp: 90 tablet, Rfl: 3 .  meloxicam (MOBIC) 7.5 MG tablet, Take 1 tablet by mouth 1 day or 1 dose., Disp: , Rfl:  .  MULTIPLE VITAMIN PO,  Take 1 tablet by mouth daily., Disp: , Rfl:  .  omeprazole (PRILOSEC) 20 MG capsule, Take 1 capsule by mouth as needed. Reported on 12/04/2015, Disp: , Rfl:  .  sennosides-docusate sodium (SENOKOT-S) 8.6-50 MG tablet, Take 1 tablet by mouth daily., Disp: , Rfl:  .  tiotropium (SPIRIVA HANDIHALER) 18 MCG inhalation capsule, Place 1 capsule (18 mcg total) into inhaler and inhale daily., Disp: 90 capsule, Rfl: 3 .  triamcinolone (NASACORT ALLERGY 24HR) 55 MCG/ACT AERO nasal inhaler, Place 2 sprays into the nose daily., Disp: , Rfl:  .  triamcinolone cream (KENALOG) 0.1 %, Apply 1 application topically 2 (two) times daily., Disp: 30 g, Rfl: 0  Review of Systems  Constitutional: Negative.   Respiratory: Negative.   Cardiovascular: Negative.   Gastrointestinal: Negative.   Genitourinary: Positive for dysuria, frequency and urgency. Negative for decreased urine volume, difficulty urinating, flank pain, hematuria, pelvic pain, vaginal bleeding, vaginal discharge and vaginal pain.  Neurological: Negative.     Social History  Substance Use Topics  . Smoking status: Former Smoker    Quit date: 08/25/1998  . Smokeless tobacco: Never Used  . Alcohol use 1.2 oz/week    2 Glasses of  wine per week     Comment: occasionally   Objective:   BP 112/68 (BP Location: Right Arm, Patient Position: Sitting, Cuff Size: Normal)   Pulse 72   Resp 16   Wt 136 lb (61.7 kg)   BMI 24.68 kg/m   Physical Exam  Constitutional: She is oriented to person, place, and time. She appears well-developed and well-nourished. No distress.  Cardiovascular: Normal rate, regular rhythm and normal heart sounds.  Exam reveals no gallop and no friction rub.   No murmur heard. Pulmonary/Chest: Effort normal and breath sounds normal. No respiratory distress. She has no wheezes. She has no rales.  Abdominal: Soft. Normal appearance and bowel sounds are normal. She exhibits no distension and no mass. There is no hepatosplenomegaly.  There is tenderness in the suprapubic area. There is no rebound, no guarding and no CVA tenderness.  Suprapubic pressure  Neurological: She is alert and oriented to person, place, and time.  Skin: Skin is warm and dry. She is not diaphoretic.  Vitals reviewed.     Assessment & Plan:     1. Acute cystitis without hematuria UA positive. Will treat empirically with Bactrim as below. Will send for culture and adjust pending C&S results. Push fluids. Call if no improvement in symptoms. - POCT urinalysis dipstick - Urine culture - sulfamethoxazole-trimethoprim (BACTRIM DS,SEPTRA DS) 800-160 MG tablet; Take 1 tablet by mouth 2 (two) times daily.  Dispense: 20 tablet; Refill: 0       Mar Daring, PA-C  [facility]

## 2016-05-02 NOTE — Patient Instructions (Signed)
Urinary Tract Infection Urinary tract infections (UTIs) can develop anywhere along your urinary tract. Your urinary tract is your body's drainage system for removing wastes and extra water. Your urinary tract includes two kidneys, two ureters, a bladder, and a urethra. Your kidneys are a pair of bean-shaped organs. Each kidney is about the size of your fist. They are located below your ribs, one on each side of your spine. CAUSES Infections are caused by microbes, which are microscopic organisms, including fungi, viruses, and bacteria. These organisms are so small that they can only be seen through a microscope. Bacteria are the microbes that most commonly cause UTIs. SYMPTOMS  Symptoms of UTIs may vary by age and gender of the patient and by the location of the infection. Symptoms in young women typically include a frequent and intense urge to urinate and a painful, burning feeling in the bladder or urethra during urination. Older women and men are more likely to be tired, shaky, and weak and have muscle aches and abdominal pain. A fever may mean the infection is in your kidneys. Other symptoms of a kidney infection include pain in your back or sides below the ribs, nausea, and vomiting. DIAGNOSIS To diagnose a UTI, your caregiver will ask you about your symptoms. Your caregiver will also ask you to provide a urine sample. The urine sample will be tested for bacteria and white blood cells. White blood cells are made by your body to help fight infection. TREATMENT  Typically, UTIs can be treated with medication. Because most UTIs are caused by a bacterial infection, they usually can be treated with the use of antibiotics. The choice of antibiotic and length of treatment depend on your symptoms and the type of bacteria causing your infection. HOME CARE INSTRUCTIONS  If you were prescribed antibiotics, take them exactly as your caregiver instructs you. Finish the medication even if you feel better after  you have only taken some of the medication.  Drink enough water and fluids to keep your urine clear or pale yellow.  Avoid caffeine, tea, and carbonated beverages. They tend to irritate your bladder.  Empty your bladder often. Avoid holding urine for long periods of time.  Empty your bladder before and after sexual intercourse.  After a bowel movement, women should cleanse from front to back. Use each tissue only once. SEEK MEDICAL CARE IF:   You have back pain.  You develop a fever.  Your symptoms do not begin to resolve within 3 days. SEEK IMMEDIATE MEDICAL CARE IF:   You have severe back pain or lower abdominal pain.  You develop chills.  You have nausea or vomiting.  You have continued burning or discomfort with urination. MAKE SURE YOU:   Understand these instructions.  Will watch your condition.  Will get help right away if you are not doing well or get worse.   This information is not intended to replace advice given to you by your health care provider. Make sure you discuss any questions you have with your health care provider.   Document Released: 05/22/2005 Document Revised: 05/03/2015 Document Reviewed: 09/20/2011 Elsevier Interactive Patient Education 2016 Elsevier Inc. Sulfamethoxazole; Trimethoprim, SMX-TMP tablets What is this medicine? SULFAMETHOXAZOLE; TRIMETHOPRIM or SMX-TMP (suhl fuh meth OK suh zohl; trye METH oh prim) is a combination of a sulfonamide antibiotic and a second antibiotic, trimethoprim. It is used to treat or prevent certain kinds of bacterial infections. It will not work for colds, flu, or other viral infections. This medicine may be  used for other purposes; ask your health care provider or pharmacist if you have questions. What should I tell my health care provider before I take this medicine? They need to know if you have any of these conditions: -anemia -asthma -being treated with anticonvulsants -if you frequently drink alcohol  containing drinks -kidney disease -liver disease -low level of folic acid or [id number] dehydrogenase -poor nutrition or malabsorption -porphyria -severe allergies -thyroid disorder -an unusual or allergic reaction to sulfamethoxazole, trimethoprim, sulfa drugs, other medicines, foods, dyes, or preservatives -pregnant or trying to get pregnant -breast-feeding How should I use this medicine? Take this medicine by mouth with a full glass of water. Follow the directions on the prescription label. Take your medicine at regular intervals. Do not take it more often than directed. Do not skip doses or stop your medicine early. Talk to your pediatrician regarding the use of this medicine in children. Special care may be needed. This medicine has been used in children as young as 26 months of age. Overdosage: If you think you have taken too much of this medicine contact a poison control center or emergency room at once. NOTE: This medicine is only for you. Do not share this medicine with others. What if I miss a dose? If you miss a dose, take it as soon as you can. If it is almost time for your next dose, take only that dose. Do not take double or extra doses. What may interact with this medicine? Do not take this medicine with any of the following medications: -aminobenzoate potassium -dofetilide -metronidazole This medicine may also interact with the following medications: -ACE inhibitors like benazepril, enalapril, lisinopril, and ramipril -birth control pills -cyclosporine -digoxin -diuretics -indomethacin -medicines for diabetes -methenamine -methotrexate -phenytoin -potassium supplements -pyrimethamine -sulfinpyrazone -tricyclic antidepressants -warfarin This list may not describe all possible interactions. Give your health care provider a list of all the medicines, herbs, non-prescription drugs, or dietary supplements you use. Also tell them if you smoke, drink alcohol,  or use illegal drugs. Some items may interact with your medicine. What should I watch for while using this medicine? Tell your doctor or health care professional if your symptoms do not improve. Drink several glasses of water a day to reduce the risk of kidney problems. Do not treat diarrhea with over the counter products. Contact your doctor if you have diarrhea that lasts more than 2 days or if it is severe and watery. This medicine can make you more sensitive to the sun. Keep out of the sun. If you cannot avoid being in the sun, wear protective clothing and use a sunscreen. Do not use sun lamps or tanning beds/booths. What side effects may I notice from receiving this medicine? Side effects that you should report to your doctor or health care professional as soon as possible: -allergic reactions like skin rash or hives, swelling of the face, lips, or tongue -breathing problems -fever or chills, sore throat -irregular heartbeat, chest pain -joint or muscle pain -pain or difficulty passing urine -red pinpoint spots on skin -redness, blistering, peeling or loosening of the skin, including inside the mouth -unusual bleeding or bruising -unusually weak or tired -yellowing of the eyes or skin Side effects that usually do not require medical attention (report to your doctor or health care professional if they continue or are bothersome): -diarrhea -dizziness -headache -loss of appetite -nausea, vomiting -nervousness This list may not describe all possible side effects. Call your doctor for medical advice about side effects. You  may report side effects to FDA at 1-800-FDA-1088. Where should I keep my medicine? Keep out of the reach of children. Store at room temperature between 20 to 25 degrees C (68 to 77 degrees F). Protect from light. Throw away any unused medicine after the expiration date. NOTE: This sheet is a summary. It may not cover all possible information. If you have questions  about this medicine, talk to your doctor, pharmacist, or health care provider.    2016, Elsevier/Gold Standard. (2013-03-19 14:38:26)

## 2016-05-04 LAB — URINE CULTURE

## 2016-05-05 LAB — POCT URINALYSIS DIPSTICK
Bilirubin, UA: NEGATIVE
GLUCOSE UA: NEGATIVE
Ketones, UA: NEGATIVE
NITRITE UA: NEGATIVE
PROTEIN UA: NEGATIVE
SPEC GRAV UA: 1.025
UROBILINOGEN UA: 0.2
pH, UA: 6

## 2016-05-08 DIAGNOSIS — M25562 Pain in left knee: Secondary | ICD-10-CM | POA: Diagnosis not present

## 2016-05-08 DIAGNOSIS — M1712 Unilateral primary osteoarthritis, left knee: Secondary | ICD-10-CM | POA: Diagnosis not present

## 2016-05-09 ENCOUNTER — Ambulatory Visit: Payer: PPO

## 2016-05-24 ENCOUNTER — Telehealth: Payer: Self-pay

## 2016-05-24 DIAGNOSIS — N3000 Acute cystitis without hematuria: Secondary | ICD-10-CM

## 2016-05-24 DIAGNOSIS — M1712 Unilateral primary osteoarthritis, left knee: Secondary | ICD-10-CM | POA: Diagnosis not present

## 2016-05-24 MED ORDER — SULFAMETHOXAZOLE-TRIMETHOPRIM 800-160 MG PO TABS: 1.0000 | ORAL_TABLET | Freq: Two times a day (BID) | ORAL | 0 refills | Status: DC

## 2016-05-24 NOTE — Telephone Encounter (Signed)
Patient called saying that she was seen in the office on 05/02/16 and was treated for a UTI. She reports that her symptoms got better for a few days, and now they have returned. She reports that she has urgency, frequency, and a fowl odor. She is requesting that something be called into the pharmacy. Patient uses [facility] in [place]. Thanks!

## 2016-05-24 NOTE — Telephone Encounter (Signed)
Pt advised. Meosha Castanon Drozdowski, CMA  

## 2016-05-24 NOTE — Telephone Encounter (Signed)
Pt had citrobacter freundii infection and was prescribed Bactrim 800-160 mg BID x 10 days, which is susceptible. Please advise. Renaldo Fiddler, CMA

## 2016-05-24 NOTE — Telephone Encounter (Signed)
Bactrim sent in

## 2016-06-06 ENCOUNTER — Ambulatory Visit (INDEPENDENT_AMBULATORY_CARE_PROVIDER_SITE_OTHER): Payer: PPO

## 2016-06-06 DIAGNOSIS — Z23 Encounter for immunization: Secondary | ICD-10-CM

## 2016-06-10 ENCOUNTER — Other Ambulatory Visit: Payer: Self-pay | Admitting: Physician Assistant

## 2016-06-10 ENCOUNTER — Other Ambulatory Visit: Payer: Self-pay | Admitting: Family Medicine

## 2016-06-10 DIAGNOSIS — Z1231 Encounter for screening mammogram for malignant neoplasm of breast: Secondary | ICD-10-CM

## 2016-06-14 ENCOUNTER — Telehealth: Payer: Self-pay | Admitting: Family Medicine

## 2016-07-01 DIAGNOSIS — H26492 Other secondary cataract, left eye: Secondary | ICD-10-CM | POA: Diagnosis not present

## 2016-07-04 ENCOUNTER — Encounter: Payer: Self-pay | Admitting: Physician Assistant

## 2016-07-04 ENCOUNTER — Ambulatory Visit: Payer: PPO | Admitting: Physician Assistant

## 2016-07-04 ENCOUNTER — Ambulatory Visit (INDEPENDENT_AMBULATORY_CARE_PROVIDER_SITE_OTHER): Payer: PPO | Admitting: Physician Assistant

## 2016-07-04 VITALS — BP 132/74 | HR 68 | Temp 97.9°F | Resp 16 | Ht 62.25 in | Wt 134.8 lb

## 2016-07-04 VITALS — BP 132/74 | HR 68 | Temp 97.9°F | Ht 62.25 in | Wt 134.5 lb

## 2016-07-04 DIAGNOSIS — E039 Hypothyroidism, unspecified: Secondary | ICD-10-CM

## 2016-07-04 DIAGNOSIS — Z13220 Encounter for screening for lipoid disorders: Secondary | ICD-10-CM | POA: Diagnosis not present

## 2016-07-04 DIAGNOSIS — R911 Solitary pulmonary nodule: Secondary | ICD-10-CM

## 2016-07-04 DIAGNOSIS — Z136 Encounter for screening for cardiovascular disorders: Secondary | ICD-10-CM | POA: Diagnosis not present

## 2016-07-04 DIAGNOSIS — Z833 Family history of diabetes mellitus: Secondary | ICD-10-CM

## 2016-07-04 DIAGNOSIS — R05 Cough: Secondary | ICD-10-CM | POA: Diagnosis not present

## 2016-07-04 DIAGNOSIS — R059 Cough, unspecified: Secondary | ICD-10-CM

## 2016-07-04 DIAGNOSIS — M810 Age-related osteoporosis without current pathological fracture: Secondary | ICD-10-CM

## 2016-07-04 DIAGNOSIS — J449 Chronic obstructive pulmonary disease, unspecified: Secondary | ICD-10-CM

## 2016-07-04 DIAGNOSIS — Z0000 Encounter for general adult medical examination without abnormal findings: Secondary | ICD-10-CM | POA: Diagnosis not present

## 2016-07-04 MED ORDER — BENZONATATE 100 MG PO CAPS: 100.0000 mg | ORAL_CAPSULE | Freq: Every day | ORAL | 1 refills | Status: DC

## 2016-07-04 NOTE — Patient Instructions (Signed)
Ms. Steininger , Thank you for taking time to come for your Medicare Wellness Visit. I appreciate your ongoing commitment to your health goals. Please review the following plan we discussed and let me know if I can assist you in the future.   These are the goals we discussed: Goals    . Increase water intake          Starting 07/04/16, I will continue drinking 8 glasses of water a day.       This is a list of the screening recommended for you and due dates:  Health Maintenance  Topic Date Due  . Shingles Vaccine  07/04/2026*  . Colon Cancer Screening  11/11/2018  . Tetanus Vaccine  11/01/2020  . Flu Shot  Completed  . DEXA scan (bone density measurement)  Completed  . Pneumonia vaccines  Completed  *Topic was postponed. The date shown is not the original due date.   Preventive Care for Adults  A healthy lifestyle and preventive care can promote health and wellness. Preventive health guidelines for adults include the following key practices.  . A routine yearly physical is a good way to check with your health care provider about your health and preventive screening. It is a chance to share any concerns and updates on your health and to receive a thorough exam.  . Visit your dentist for a routine exam and preventive care every 6 months. Brush your teeth twice a day and floss once a day. Good oral hygiene prevents tooth decay and gum disease.  . The frequency of eye exams is based on your age, health, family medical history, use  of contact lenses, and other factors. Follow your health care provider's ecommendations for frequency of eye exams.  . Eat a healthy diet. Foods like vegetables, fruits, whole grains, low-fat dairy products, and lean protein foods contain the nutrients you need without too many calories. Decrease your intake of foods high in solid fats, added sugars, and salt. Eat the right amount of calories for you. Get information about a proper diet from your health care  provider, if necessary.  . Regular physical exercise is one of the most important things you can do for your health. Most adults should get at least 150 minutes of moderate-intensity exercise (any activity that increases your heart rate and causes you to sweat) each week. In addition, most adults need muscle-strengthening exercises on 2 or more days a week.  Silver Sneakers may be a benefit available to you. To determine eligibility, you may visit the website: www.silversneakers.com or contact program at 805-593-3097 Mon-Fri between 8AM-8PM.   . Maintain a healthy weight. The body mass index (BMI) is a screening tool to identify possible weight problems. It provides an estimate of body fat based on height and weight. Your health care provider can find your BMI and can help you achieve or maintain a healthy weight.   For adults 20 years and older: ? A BMI below 18.5 is considered underweight. ? A BMI of 18.5 to 24.9 is normal. ? A BMI of 25 to 29.9 is considered overweight. ? A BMI of 30 and above is considered obese.   . Maintain normal blood lipids and cholesterol levels by exercising and minimizing your intake of saturated fat. Eat a balanced diet with plenty of fruit and vegetables. Blood tests for lipids and cholesterol should begin at age 36 and be repeated every 5 years. If your lipid or cholesterol levels are high, you are over 50,  or you are at high risk for heart disease, you may need your cholesterol levels checked more frequently. Ongoing high lipid and cholesterol levels should be treated with medicines if diet and exercise are not working.  . If you smoke, find out from your health care provider how to quit. If you do not use tobacco, please do not start.  . If you choose to drink alcohol, please do not consume more than 2 drinks per day. One drink is considered to be 12 ounces (355 mL) of beer, 5 ounces (148 mL) of wine, or 1.5 ounces (44 mL) of liquor.  . If you are 29-79 years  old, ask your health care provider if you should take aspirin to prevent strokes.  . Use sunscreen. Apply sunscreen liberally and repeatedly throughout the day. You should seek shade when your shadow is shorter than you. Protect yourself by wearing long sleeves, pants, a wide-brimmed hat, and sunglasses year round, whenever you are outdoors.  . Once a month, do a whole body skin exam, using a mirror to look at the skin on your back. Tell your health care provider of new moles, moles that have irregular borders, moles that are larger than a pencil eraser, or moles that have changed in shape or color.

## 2016-07-04 NOTE — Progress Notes (Signed)
Subjective:   [name] [name] is a 75 y.o. female who presents for Medicare Annual (Subsequent) preventive examination.  Review of Systems:  N/A Cardiac Risk Factors include: advanced age (>40men, >63 women)     Objective:     Vitals: BP 132/74 (BP Location: Right Arm)   Pulse 68   Temp 97.9 F (36.6 C) (Oral)   Ht 5' 2.25" (1.581 m)   Wt 134 lb 8 oz (61 kg)   BMI 24.40 kg/m   Body mass index is 24.4 kg/m.   Tobacco History  Smoking Status  . Former Smoker  . Quit date: 08/25/1998  Smokeless Tobacco  . Never Used     Counseling given: Not Answered   Past Medical History:  Diagnosis Date  . Allergy   . 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 Asthma   . COPD (chronic obstructive pulmonary disease) ([place])   . Dysrhythmia    PVC's  . GERD (gastroesophageal reflux disease)   . Hyperlipidemia   . Hypothyroidism   . Pneumonia   . PVC (premature ventricular contraction)   . Shortness of breath dyspnea   . Thyroid disease    Past Surgical History:  Procedure Laterality Date  . abdominal surgery    . APPENDECTOMY    . BLEPHAROPLASTY Bilateral 11/2014   left eyelid 08/03/2015 Dr. Vickki Muff  . BREAST CYST ASPIRATION Left   . CHOLECYSTECTOMY  03/2004  . [place]   fertility testing  . EYE SURGERY Bilateral 2015   cataract excision  . KNEE SURGERY Right    knee arthroscopy-2000; Total knee replacement-2002  . LAPAROTOMY  1969   fertility testing  . patelectomy Right   . Removal of medical meniscus  1972   Family History  Problem Relation Age of Onset  . Diabetes Mother   . Hypertension Mother   . Congestive Heart Failure Mother   . Heart disease Father   . Hypertension Father   . Hyperlipidemia Sister   . Heart disease Sister   . Paget's disease of bone Sister   . Hyperthyroidism Sister   . Cancer Sister    History  Sexual Activity  . Sexual activity: Not on file    Outpatient Encounter  Prescriptions as of 07/04/2016  Medication Sig  . Acetaminophen 500 MG coapsule Take 1 capsule by mouth every 6 (six) hours as needed.   Marland Kitchen albuterol (VENTOLIN HFA) 108 (90 Base) MCG/ACT inhaler Inhale 1-2 puffs into the lungs every 4 (four) hours as needed for wheezing or shortness of breath. Reported on 12/04/2015  . aspirin 81 MG tablet Take 81 mg by mouth daily.   . benzonatate (TESSALON) 100 MG capsule Take 100 mg by mouth daily.  . fexofenadine (ALLEGRA) 180 MG tablet Take 1 tablet by mouth daily.   . flecainide (TAMBOCOR) 50 MG tablet Take 1 tablet by mouth 1 day or 1 dose.  Marland Kitchen Fluticasone-Salmeterol (ADVAIR DISKUS) 250-50 MCG/DOSE AEPB Inhale 1 puff into the lungs 2 (two) times daily. 1 puff BID daily  . levothyroxine (SYNTHROID, LEVOTHROID) 75 MCG tablet Take 1 tablet (75 mcg total) by mouth daily.  . meloxicam (MOBIC) 7.5 MG tablet Take 1 tablet by mouth 1 day or 1 dose.   . MULTIPLE VITAMIN PO Take 1 tablet by mouth daily.  Marland Kitchen omeprazole (PRILOSEC) 20 MG capsule Take 1 capsule by mouth as needed. Reported on 12/04/2015  . sennosides-docusate sodium (SENOKOT-S) 8.6-50 MG  tablet Take 1 tablet by mouth daily.   Marland Kitchen tiotropium (SPIRIVA HANDIHALER) 18 MCG inhalation capsule Place 1 capsule (18 mcg total) into inhaler and inhale daily.  Marland Kitchen triamcinolone (NASACORT ALLERGY 24HR) 55 MCG/ACT AERO nasal inhaler Place 2 sprays into the nose daily.  Marland Kitchen triamcinolone cream (KENALOG) 0.1 % Apply 1 application topically 2 (two) times daily. (Patient taking differently: Apply 1 application topically 2 (two) times daily. )  . sulfamethoxazole-trimethoprim (BACTRIM DS,SEPTRA DS) 800-160 MG tablet Take 1 tablet by mouth 2 (two) times daily. (Patient not taking: Reported on 07/04/2016)   No facility-administered encounter medications on file as of 07/04/2016.     Activities of Daily Living In your present state of health, do you have any difficulty performing the following activities: 07/04/2016  Hearing? Y    Vision? N  Difficulty concentrating or making decisions? N  Walking or climbing stairs? Y  Dressing or bathing? N  Doing errands, shopping? N  Preparing Food and eating ? N  Using the Toilet? N  In the past six months, have you accidently leaked urine? Y  Do you have problems with loss of bowel control? N  Managing your Medications? N  Managing your Finances? N  Housekeeping or managing your Housekeeping? N  Some recent data might be hidden    Patient Care Team: Mar Daring, PA-C as PCP - General (Family Medicine) Birder Robson, MD as Referring Physician (Ophthalmology) Teodoro Spray, MD as Consulting Physician (Cardiology) Gaynelle Arabian, MD as Consulting Physician (Orthopedic Surgery) Nestor Lewandowsky, MD as Referring Physician (Cardiothoracic Surgery)    Assessment:     Exercise Activities and Dietary recommendations Current Exercise Habits: Home exercise routine, Type of exercise: treadmill;walking, Time (Minutes): 40, Frequency (Times/Week): 7, Weekly Exercise (Minutes/Week): 280, Intensity: Mild  Goals    . Increase water intake          Starting 07/04/16, I will continue drinking 8 glasses of water a day.      Fall Risk Fall Risk  07/04/2016 06/28/2015  Falls in the past year? No Yes  Number falls in past yr: - 1  Injury with Fall? - No   Depression Screen PHQ 2/9 Scores 07/04/2016 06/28/2015  PHQ - 2 Score 1 0     Cognitive Function     6CIT Screen 07/04/2016  What Year? 0 points  What month? 0 points  What time? 0 points  Count back from 20 0 points  Months in reverse 0 points  Repeat phrase 2 points  Total Score 2    Immunization History  Administered Date(s) Administered  . Influenza, High Dose Seasonal PF 05/25/2015, 06/06/2016  . Pneumococcal Conjugate-13 06/24/2014  . Pneumococcal Polysaccharide-23 09/23/2013  . Tdap 11/02/2010   Screening Tests Health Maintenance  Topic Date Due  . ZOSTAVAX  07/04/2026 (Originally 05/15/2001)  .  COLONOSCOPY  11/11/2018  . TETANUS/TDAP  11/01/2020  . INFLUENZA VACCINE  Completed  . DEXA SCAN  Completed  . PNA vac Low Risk Adult  Completed      Plan:  I have personally reviewed and addressed the Medicare Annual Wellness questionnaire and have noted the following in the patient's chart:  A. Medical and social history B. Use of alcohol, tobacco or illicit drugs  C. Current medications and supplements D. Functional ability and status E.  Nutritional status F.  Physical activity G. Advance directives H. List of other physicians I.  Hospitalizations, surgeries, and ER visits in previous 12 months J.  Vitals K. Screenings such  as hearing and vision if needed, cognitive and depression L. Referrals and appointments - none  In addition, I have reviewed and discussed with patient certain preventive protocols, quality metrics, and best practice recommendations. A written personalized care plan for preventive services as well as general preventive health recommendations were provided to patient.  See attached scanned questionnaire for additional information.   Signed,  Fabio Neighbors, LPN Nurse Health Advisor  I have reviewed the documentation and information obtained by Fabio Neighbors, LPN in the above chart and agree as above. I was available for consultation if any questions or issues arose.  Fenton Malling, PA-C

## 2016-07-04 NOTE — Patient Instructions (Signed)

## 2016-07-04 NOTE — Progress Notes (Signed)
Patient: [name] [name] Female    DOB: [birth year]   75 y.o.   MRN: [id number] Visit Date: 07/04/2016  Today's Provider: Mar Daring, PA-C   Chief Complaint  Patient presents with  . COPD  . Hypothyroidism  . Osteoporosis   Subjective:    HPI Patient returns to office to address COPD, she states that she has been doing well and rarely uses her rescue inhaler due to symptoms of heart fluttering. She does use her advair and Spiriva daily. Patient states that she actively walks 68mi per week.   Patient states that she is compliant with taking her mediation (levothyroxine) for Hypothyrodism with no adverse side effects. Patient reports there are times when she does have body chills but nothing out of the usual.   Patient is also following up for Osteoporsis last BMD was 07/22/15. She is not on any bisphosphonates at this time. She does not take calcium or vitamin D supplementation due to constipation.  She does also have lung nodules that were first identified in 2012. She is followed by Dr. Faith Rogue and is scheduled for a f/u CT in Jan 2018.    Allergies  Allergen Reactions  . Codeine   . Diphenhydramine     Decreased BP and pulse rate  . Montelukast Sodium     Insomnia  . Morphine Sulfate   . Nsaids     Can take Meloxicam, Naproxen  . Other Nausea And Vomiting    opiates  . Tramadol     Mental status change  . Celecoxib Rash    Epigastric pain  . Rofecoxib Rash    Epigastric pain     Current Outpatient Prescriptions:  .  Acetaminophen 500 MG coapsule, Take 1 capsule by mouth every 6 (six) hours as needed. , Disp: , Rfl:  .  albuterol (VENTOLIN HFA) 108 (90 Base) MCG/ACT inhaler, Inhale 1-2 puffs into the lungs every 4 (four) hours as needed for wheezing or shortness of breath. Reported on 12/04/2015, Disp: 1 Inhaler, Rfl: 12 .  aspirin 81 MG tablet, Take 81 mg by mouth daily. , Disp: , Rfl:  .  benzonatate (TESSALON) 100 MG capsule, Take 100 mg by mouth daily.,  Disp: , Rfl:  .  fexofenadine (ALLEGRA) 180 MG tablet, Take 1 tablet by mouth daily. , Disp: , Rfl:  .  flecainide (TAMBOCOR) 50 MG tablet, Take 1 tablet by mouth 1 day or 1 dose., Disp: , Rfl:  .  Fluticasone-Salmeterol (ADVAIR DISKUS) 250-50 MCG/DOSE AEPB, Inhale 1 puff into the lungs 2 (two) times daily. 1 puff BID daily, Disp: 180 each, Rfl: 3 .  levothyroxine (SYNTHROID, LEVOTHROID) 75 MCG tablet, Take 1 tablet (75 mcg total) by mouth daily., Disp: 90 tablet, Rfl: 3 .  meloxicam (MOBIC) 7.5 MG tablet, Take 1 tablet by mouth 1 day or 1 dose. , Disp: , Rfl:  .  MULTIPLE VITAMIN PO, Take 1 tablet by mouth daily., Disp: , Rfl:  .  omeprazole (PRILOSEC) 20 MG capsule, Take 1 capsule by mouth as needed. Reported on 12/04/2015, Disp: , Rfl:  .  sennosides-docusate sodium (SENOKOT-S) 8.6-50 MG tablet, Take 1 tablet by mouth daily. , Disp: , Rfl:  .  sulfamethoxazole-trimethoprim (BACTRIM DS,SEPTRA DS) 800-160 MG tablet, Take 1 tablet by mouth 2 (two) times daily. (Patient not taking: Reported on 07/04/2016), Disp: 20 tablet, Rfl: 0 .  tiotropium (SPIRIVA HANDIHALER) 18 MCG inhalation capsule, Place 1 capsule (18 mcg total) into inhaler and  inhale daily., Disp: 90 capsule, Rfl: 3 .  triamcinolone (NASACORT ALLERGY 24HR) 55 MCG/ACT AERO nasal inhaler, Place 2 sprays into the nose daily., Disp: , Rfl:  .  triamcinolone cream (KENALOG) 0.1 %, Apply 1 application topically 2 (two) times daily. (Patient taking differently: Apply 1 application topically 2 (two) times daily. ), Disp: 30 g, Rfl: 0  Review of Systems  Constitutional: Negative.   HENT: Positive for congestion, rhinorrhea and sinus pressure.   Eyes: Positive for photophobia.  Respiratory: Positive for cough and shortness of breath.   Cardiovascular: Positive for palpitations.  Gastrointestinal: Positive for constipation.  Endocrine: Positive for polyuria. Negative for polydipsia.  Musculoskeletal: Positive for arthralgias and joint swelling.    Skin: Negative.   Allergic/Immunologic: Negative.   Neurological: Negative.   Hematological: Bruises/bleeds easily.  Psychiatric/Behavioral: Negative.     Social History  Substance Use Topics  . Smoking status: Former Smoker    Quit date: 08/25/1998  . Smokeless tobacco: Never Used  . Alcohol use 1.2 oz/week    2 Glasses of wine per week     Comment: liquor occasionally   Objective:   BP 132/74   Pulse 68   Temp 97.9 F (36.6 C) (Oral)   Resp 16   Ht 5' 2.25" (1.581 m)   Wt 134 lb 12.8 oz (61.1 kg)   BMI 24.46 kg/m   Physical Exam  Constitutional: She is oriented to person, place, and time. She appears well-developed and well-nourished. No distress.  HENT:  Head: Normocephalic and atraumatic.  Right Ear: External ear normal.  Left Ear: External ear normal.  Nose: Nose normal.  Mouth/Throat: Oropharynx is clear and moist. No oropharyngeal exudate.  Eyes: Conjunctivae and EOM are normal. Pupils are equal, round, and reactive to light. Right eye exhibits no discharge. Left eye exhibits no discharge. No scleral icterus.  Neck: Normal range of motion. Neck supple. No JVD present. No tracheal deviation present. No thyromegaly present.  Cardiovascular: Normal rate, regular rhythm, normal heart sounds and intact distal pulses.  Exam reveals no gallop and no friction rub.   No murmur heard. Pulmonary/Chest: Effort normal and breath sounds normal. No respiratory distress. She has no wheezes. She has no rales. She exhibits no tenderness.  Abdominal: Soft. Bowel sounds are normal. She exhibits no distension and no mass. There is no tenderness. There is no rebound and no guarding.  Musculoskeletal: Normal range of motion. She exhibits no edema or tenderness.  Lymphadenopathy:    She has no cervical adenopathy.  Neurological: She is alert and oriented to person, place, and time.  Skin: Skin is warm and dry. No rash noted. She is not diaphoretic.  Psychiatric: She has a normal mood  and affect. Her behavior is normal. Judgment and thought content normal.  Vitals reviewed.     Assessment & Plan:     1. Hypothyroidism, unspecified type Previously stable on levothyroxine 64mcg. Will check labs as below and f/u pending results. - TSH  2. Chronic obstructive pulmonary disease, unspecified COPD type ([place]) Stable on Advair and Spiriva. Will check labs as below and f/u pending results. - CBC w/Diff/Platelet - Comprehensive Metabolic Panel (CMET)  3. Lung nodule, solitary Followed by Dr. Faith Rogue. Has appt in January for repeat CT.  4. Family history of diabetes mellitus in first degree relative Mother and sister with diabetes. Will check labs as below and f/u pending results. - HgB A1c  5. Encounter for lipid screening for cardiovascular disease Will check labs as below  and f/u pending results. - Lipid Profile  6. Age-related osteoporosis without current pathological fracture Not on any treatment. BMD due in 06/2017. Will check labs as below and f/u pending results. - Vitamin D (25 hydroxy)  7. Cough Will refill tessalon perles as below for cough.  - benzonatate (TESSALON) 100 MG capsule; Take 1 capsule (100 mg total) by mouth daily.  Dispense: 30 capsule; Refill: [place], PA-C  [facility]

## 2016-07-05 ENCOUNTER — Telehealth: Payer: Self-pay

## 2016-07-05 LAB — COMPREHENSIVE METABOLIC PANEL
ALBUMIN: 4.6 g/dL (ref 3.5–4.8)
ALK PHOS: 70 IU/L (ref 39–117)
ALT: 25 IU/L (ref 0–32)
AST: 22 IU/L (ref 0–40)
Albumin/Globulin Ratio: 1.8 (ref 1.2–2.2)
BILIRUBIN TOTAL: 0.8 mg/dL (ref 0.0–1.2)
BUN / CREAT RATIO: 18 (ref 12–28)
BUN: 12 mg/dL (ref 8–27)
CO2: 25 mmol/L (ref 18–29)
CREATININE: 0.66 mg/dL (ref 0.57–1.00)
Calcium: 9.9 mg/dL (ref 8.7–10.3)
Chloride: 99 mmol/L (ref 96–106)
GFR calc Af Amer: 100 mL/min/{1.73_m2} (ref >59)
GFR calc non Af Amer: 87 mL/min/{1.73_m2} (ref >59)
GLOBULIN, TOTAL: 2.5 g/dL (ref 1.5–4.5)
Glucose: 89 mg/dL (ref 65–99)
Potassium: 4.7 mmol/L (ref 3.5–5.2)
SODIUM: 142 mmol/L (ref 134–144)
Total Protein: 7.1 g/dL (ref 6.0–8.5)

## 2016-07-05 LAB — CBC WITH DIFFERENTIAL/PLATELET
BASOS: 0 %
Basophils Absolute: 0 10*3/uL (ref 0.0–0.2)
EOS (ABSOLUTE): 0.1 10*3/uL (ref 0.0–0.4)
EOS: 1 %
HEMATOCRIT: 44.5 % (ref 34.0–46.6)
HEMOGLOBIN: 15.4 g/dL (ref 11.1–15.9)
Immature Grans (Abs): 0 10*3/uL (ref 0.0–0.1)
Immature Granulocytes: 0 %
LYMPHS ABS: 1.5 10*3/uL (ref 0.7–3.1)
Lymphs: 26 %
MCH: 32.6 pg (ref 26.6–33.0)
MCHC: 34.6 g/dL (ref 31.5–35.7)
MCV: 94 fL (ref 79–97)
MONOCYTES: 8 %
Monocytes Absolute: 0.5 10*3/uL (ref 0.1–0.9)
NEUTROS ABS: 3.8 10*3/uL (ref 1.4–7.0)
Neutrophils: 65 %
Platelets: 229 10*3/uL (ref 150–379)
RBC: 4.72 x10E6/uL (ref 3.77–5.28)
RDW: 13.6 % (ref 12.3–15.4)
WBC: 6 10*3/uL (ref 3.4–10.8)

## 2016-07-05 LAB — LIPID PANEL
CHOLESTEROL TOTAL: 267 mg/dL — AB (ref 100–199)
Chol/HDL Ratio: 2.5 ratio units (ref 0.0–4.4)
HDL: 106 mg/dL (ref >39)
LDL CALC: 146 mg/dL — AB (ref 0–99)
Triglycerides: 74 mg/dL (ref 0–149)
VLDL Cholesterol Cal: 15 mg/dL (ref 5–40)

## 2016-07-05 LAB — TSH: TSH: 1.94 u[IU]/mL (ref 0.450–4.500)

## 2016-07-05 LAB — HEMOGLOBIN A1C
Est. average glucose Bld gHb Est-mCnc: 103 mg/dL
HEMOGLOBIN A1C: 5.2 % (ref 4.8–5.6)

## 2016-07-05 LAB — VITAMIN D 25 HYDROXY (VIT D DEFICIENCY, FRACTURES): VIT D 25 HYDROXY: 35.5 ng/mL (ref 30.0–100.0)

## 2016-07-05 NOTE — Telephone Encounter (Signed)
-----   Message from Mar Daring, [place] sent at 07/05/2016 10:00 AM EST ----- All labs are stable and WNL with exception of cholesterol which is slightly elevated from labs 10 months ago. Continue to work on healthy lifestyle and limiting fatty foods.

## 2016-07-05 NOTE — Telephone Encounter (Signed)
Patient advised as below. Patient verbalizes understanding and is in agreement with treatment plan.  

## 2016-07-10 ENCOUNTER — Encounter: Payer: Self-pay | Admitting: Physician Assistant

## 2016-07-10 ENCOUNTER — Ambulatory Visit (INDEPENDENT_AMBULATORY_CARE_PROVIDER_SITE_OTHER): Payer: PPO | Admitting: Physician Assistant

## 2016-07-10 ENCOUNTER — Ambulatory Visit
Admission: RE | Admit: 2016-07-10 | Discharge: 2016-07-10 | Disposition: A | Discharge status: Home / Self Care | Payer: PPO | Source: Ambulatory Visit | Attending: Physician Assistant | Admitting: Physician Assistant

## 2016-07-10 VITALS — BP 126/62 | HR 76 | Temp 97.6°F | Resp 16 | Wt 137.0 lb

## 2016-07-10 DIAGNOSIS — J441 Chronic obstructive pulmonary disease with (acute) exacerbation: Secondary | ICD-10-CM | POA: Insufficient documentation

## 2016-07-10 DIAGNOSIS — R05 Cough: Secondary | ICD-10-CM | POA: Diagnosis not present

## 2016-07-10 DIAGNOSIS — I700 Atherosclerosis of aorta: Secondary | ICD-10-CM | POA: Insufficient documentation

## 2016-07-10 MED ORDER — PREDNISONE 20 MG PO TABS: 40.0000 mg | ORAL_TABLET | Freq: Every day | ORAL | 0 refills | Status: AC

## 2016-07-10 MED ORDER — PREDNISONE 20 MG PO TABS: 20.0000 mg | ORAL_TABLET | Freq: Every day | ORAL | 0 refills | Status: DC

## 2016-07-10 MED ORDER — DOXYCYCLINE HYCLATE 100 MG PO TABS: 100.0000 mg | ORAL_TABLET | Freq: Two times a day (BID) | ORAL | 0 refills | Status: DC

## 2016-07-10 NOTE — Progress Notes (Signed)
Patient: [name] [name] Female    DOB: [birth year]   75 y.o.   MRN: [id number] Visit Date: 07/10/2016  Today's Provider: Trinna Post, PA-C   Chief Complaint  Patient presents with  . Cough  . COPD   Subjective:    Cough  This is a new problem. The current episode started in the past 7 days. The problem has been gradually worsening. The cough is productive of sputum. Associated symptoms include ear pain (Occasional left ear pain.), headaches, postnasal drip and shortness of breath. Pertinent negatives include no chills, fever, hemoptysis, rhinorrhea, sore throat or wheezing. She has tried prescription cough suppressant, steroid inhaler and ipratropium inhaler for the symptoms.   Patient is a 75 y/o female with PMH significant for COPD on Advair and Spiriva with albuterol rescue coming in today complaining of cough over the past week that has turned productive of green purulent sputum. No fever, chills, myalgias. Some SOB. Does not like using albuterol 2/2 palpitations.     Allergies  Allergen Reactions  . Codeine   . Diphenhydramine     Decreased BP and pulse rate  . Montelukast Sodium     Insomnia  . Morphine Sulfate   . Nsaids     Can take Meloxicam, Naproxen  . Other Nausea And Vomiting    opiates  . Tramadol     Mental status change  . Celecoxib Rash    Epigastric pain  . Rofecoxib Rash    Epigastric pain     Current Outpatient Prescriptions:  .  Acetaminophen 500 MG coapsule, Take 1 capsule by mouth every 6 (six) hours as needed. , Disp: , Rfl:  .  albuterol (VENTOLIN HFA) 108 (90 Base) MCG/ACT inhaler, Inhale 1-2 puffs into the lungs every 4 (four) hours as needed for wheezing or shortness of breath. Reported on 12/04/2015, Disp: 1 Inhaler, Rfl: 12 .  aspirin 81 MG tablet, Take 81 mg by mouth daily. , Disp: , Rfl:  .  benzonatate (TESSALON) 100 MG capsule, Take 1 capsule (100 mg total) by mouth daily., Disp: 30 capsule, Rfl: 1 .  fexofenadine (ALLEGRA) 180  MG tablet, Take 1 tablet by mouth daily. , Disp: , Rfl:  .  flecainide (TAMBOCOR) 50 MG tablet, Take 1 tablet by mouth 1 day or 1 dose., Disp: , Rfl:  .  Fluticasone-Salmeterol (ADVAIR DISKUS) 250-50 MCG/DOSE AEPB, Inhale 1 puff into the lungs 2 (two) times daily. 1 puff BID daily, Disp: 180 each, Rfl: 3 .  levothyroxine (SYNTHROID, LEVOTHROID) 75 MCG tablet, Take 1 tablet (75 mcg total) by mouth daily., Disp: 90 tablet, Rfl: 3 .  meloxicam (MOBIC) 7.5 MG tablet, Take 1 tablet by mouth 1 day or 1 dose. , Disp: , Rfl:  .  MULTIPLE VITAMIN PO, Take 1 tablet by mouth daily., Disp: , Rfl:  .  omeprazole (PRILOSEC) 20 MG capsule, Take 1 capsule by mouth as needed. Reported on 12/04/2015, Disp: , Rfl:  .  sennosides-docusate sodium (SENOKOT-S) 8.6-50 MG tablet, Take 1 tablet by mouth daily. , Disp: , Rfl:  .  sulfamethoxazole-trimethoprim (BACTRIM DS,SEPTRA DS) 800-160 MG tablet, Take 1 tablet by mouth 2 (two) times daily., Disp: 20 tablet, Rfl: 0 .  tiotropium (SPIRIVA HANDIHALER) 18 MCG inhalation capsule, Place 1 capsule (18 mcg total) into inhaler and inhale daily., Disp: 90 capsule, Rfl: 3 .  triamcinolone (NASACORT ALLERGY 24HR) 55 MCG/ACT AERO nasal inhaler, Place 2 sprays into the nose daily., Disp: , Rfl:  .  triamcinolone cream (KENALOG) 0.1 %, Apply 1 application topically 2 (two) times daily. (Patient taking differently: Apply 1 application topically 2 (two) times daily. ), Disp: 30 g, Rfl: 0  Review of Systems  Constitutional: Negative for activity change, appetite change, chills, diaphoresis, fatigue, fever and unexpected weight change.  HENT: Positive for congestion, ear pain (Occasional left ear pain.), postnasal drip, sinus pain, sinus pressure and voice change. Negative for ear discharge, nosebleeds, rhinorrhea, sneezing, sore throat, tinnitus and trouble swallowing.   Eyes: Negative.   Respiratory: Positive for cough and shortness of breath. Negative for apnea, hemoptysis, choking, chest  tightness, wheezing and stridor.   Gastrointestinal: Negative.   Neurological: Positive for headaches. Negative for dizziness and light-headedness.    Social History  Substance Use Topics  . Smoking status: Former Smoker    Quit date: 08/25/1998  . Smokeless tobacco: Never Used  . Alcohol use 1.2 oz/week    2 Glasses of wine per week     Comment: liquor occasionally   Objective:   BP 126/62 (BP Location: Left Arm, Patient Position: Sitting, Cuff Size: Normal)   Pulse 76   Temp 97.6 F (36.4 C) (Oral)   Resp 16   Wt 137 lb (62.1 kg)   BMI 24.86 kg/m   Physical Exam  Constitutional: She appears well-developed and well-nourished.  HENT:  Right Ear: External ear normal.  Left Ear: External ear normal.  Mouth/Throat: Oropharynx is clear and moist. No oropharyngeal exudate.  Eyes: Right eye exhibits no discharge. Left eye exhibits no discharge.  Neck: Neck supple.  Cardiovascular: Normal rate and regular rhythm.   Pulmonary/Chest: Effort normal and breath sounds normal. No respiratory distress. She has no wheezes. She has no rales.  No SOB, pt speaking in full sentences.  Lymphadenopathy:    She has no cervical adenopathy.  Skin: Skin is warm and dry.  Psychiatric: She has a normal mood and affect. Her behavior is normal.        Assessment & Plan:     Problem List Items Addressed This Visit    None    Visit Diagnoses    COPD exacerbation ([place])    -  Primary   Relevant Medications   doxycycline (VIBRA-TABS) 100 MG tablet   predniSONE (DELTASONE) 20 MG tablet   Other Relevant Orders   DG Chest 2 View     Patient is 75 y/o female presenting with symptoms consistent with COPD exacerbation. Will get CXR to assess for PNA, but patient oxygenating well, no SOB, no fever. Have treated as above, counseled on return precautions.   Return if symptoms worsen or fail to improve.   Patient Instructions  Chronic Obstructive Pulmonary Disease Exacerbation Chronic obstructive  pulmonary disease (COPD) is a common lung condition in which airflow from the lungs is limited. COPD is a general term that can be used to describe many different lung problems that limit airflow, including chronic bronchitis and emphysema. COPD exacerbations are episodes when breathing symptoms become much worse and require extra treatment. Without treatment, COPD exacerbations can be life threatening, and frequent COPD exacerbations can cause further damage to your lungs. What are the causes?  Respiratory infections.  Exposure to smoke.  Exposure to air pollution, chemical fumes, or dust. Sometimes there is no apparent cause or trigger. What increases the risk?  Smoking cigarettes.  Older age.  Frequent prior COPD exacerbations. What are the signs or symptoms?  Increased coughing.  Increased thick spit (sputum) production.  Increased wheezing.  Increased  shortness of breath.  Rapid breathing.  Chest tightness. How is this diagnosed? Your medical history, a physical exam, and tests will help your health care provider make a diagnosis. Tests may include:  A chest X-ray.  Basic lab tests.  Sputum testing.  An arterial blood gas test. How is this treated? Depending on the severity of your COPD exacerbation, you may need to be admitted to a hospital for treatment. Some of the treatments commonly used to treat COPD exacerbations are:  Antibiotic medicines.  Bronchodilators. These are drugs that expand the air passages. They may be given with an inhaler or nebulizer. Spacer devices may be needed to help improve drug delivery.  Corticosteroid medicines.  Supplemental oxygen therapy.  Airway clearing techniques, such as noninvasive ventilation (NIV) and positive expiratory pressure (PEP). These provide respiratory support through a mask or other noninvasive device. Follow these instructions at home:  Do not smoke. Quitting smoking is very important to prevent COPD from  getting worse and exacerbations from happening as often.  Avoid exposure to all substances that irritate the airway, especially to tobacco smoke.  If you were prescribed an antibiotic medicine, finish it all even if you start to feel better.  Take all medicines as directed by your health care provider.It is important to use correct technique with inhaled medicines.  Drink enough fluids to keep your urine clear or pale yellow (unless you have a medical condition that requires fluid restriction).  Use a cool mist vaporizer. This makes it easier to clear your chest when you cough.  If you have a home nebulizer and oxygen, continue to use them as directed.  Maintain all necessary vaccinations to prevent infections.  Exercise regularly.  Eat a healthy diet.  Keep all follow-up appointments as directed by your health care provider. Get help right away if:  You have worsening shortness of breath.  You have trouble talking.  You have severe chest pain.  You have blood in your sputum.  You have a fever.  You have weakness, vomit repeatedly, or faint.  You feel confused.  You continue to get worse. This information is not intended to replace advice given to you by your health care provider. Make sure you discuss any questions you have with your health care provider. Document Released: 06/09/2007 Document Revised: 01/18/2016 Document Reviewed: 04/16/2013 Elsevier Interactive Patient Education  2017 [employer].   The entirety of the information documented in the History of Present Illness, Review of Systems and Physical Exam were personally obtained by me. Portions of this information were initially documented by Ashley Royalty, CMA and reviewed by me for thoroughness and accuracy.        Trinna Post, PA-C  [facility]

## 2016-07-10 NOTE — Patient Instructions (Signed)
Chronic Obstructive Pulmonary Disease Exacerbation Chronic obstructive pulmonary disease (COPD) is a common lung condition in which airflow from the lungs is limited. COPD is a general term that can be used to describe many different lung problems that limit airflow, including chronic bronchitis and emphysema. COPD exacerbations are episodes when breathing symptoms become much worse and require extra treatment. Without treatment, COPD exacerbations can be life threatening, and frequent COPD exacerbations can cause further damage to your lungs. What are the causes?  Respiratory infections.  Exposure to smoke.  Exposure to air pollution, chemical fumes, or dust. Sometimes there is no apparent cause or trigger. What increases the risk?  Smoking cigarettes.  Older age.  Frequent prior COPD exacerbations. What are the signs or symptoms?  Increased coughing.  Increased thick spit (sputum) production.  Increased wheezing.  Increased shortness of breath.  Rapid breathing.  Chest tightness. How is this diagnosed? Your medical history, a physical exam, and tests will help your health care provider make a diagnosis. Tests may include:  A chest X-ray.  Basic lab tests.  Sputum testing.  An arterial blood gas test.  How is this treated? Depending on the severity of your COPD exacerbation, you may need to be admitted to a hospital for treatment. Some of the treatments commonly used to treat COPD exacerbations are:  Antibiotic medicines.  Bronchodilators. These are drugs that expand the air passages. They may be given with an inhaler or nebulizer. Spacer devices may be needed to help improve drug delivery.  Corticosteroid medicines.  Supplemental oxygen therapy.  Airway clearing techniques, such as noninvasive ventilation (NIV) and positive expiratory pressure (PEP). These provide respiratory support through a mask or other noninvasive device.  Follow these instructions at  home:  Do not smoke. Quitting smoking is very important to prevent COPD from getting worse and exacerbations from happening as often.  Avoid exposure to all substances that irritate the airway, especially to tobacco smoke.  If you were prescribed an antibiotic medicine, finish it all even if you start to feel better.  Take all medicines as directed by your health care provider.It is important to use correct technique with inhaled medicines.  Drink enough fluids to keep your urine clear or pale yellow (unless you have a medical condition that requires fluid restriction).  Use a cool mist vaporizer. This makes it easier to clear your chest when you cough.  If you have a home nebulizer and oxygen, continue to use them as directed.  Maintain all necessary vaccinations to prevent infections.  Exercise regularly.  Eat a healthy diet.  Keep all follow-up appointments as directed by your health care provider. Get help right away if:  You have worsening shortness of breath.  You have trouble talking.  You have severe chest pain.  You have blood in your sputum.  You have a fever.  You have weakness, vomit repeatedly, or faint.  You feel confused.  You continue to get worse. This information is not intended to replace advice given to you by your health care provider. Make sure you discuss any questions you have with your health care provider. Document Released: 06/09/2007 Document Revised: 01/18/2016 Document Reviewed: 04/16/2013 Elsevier Interactive Patient Education  2017 [employer] Inc[employer]  

## 2016-07-11 ENCOUNTER — Telehealth: Payer: Self-pay

## 2016-07-11 NOTE — Telephone Encounter (Signed)
-----   Message from Trinna Post, [place] sent at 07/11/2016 11:23 AM EST ----- CXR showed some hyperinflation consistent with COPD. No focal infiltrate worrisome for pneumonia. Some resolving prominence in right suprahilar region in comparison to CT in July 2017. If patient has continuous or worsening symptoms, radiology recommends Ct. Patient may continue with doxycycline and steroid taper.

## 2016-07-11 NOTE — Telephone Encounter (Signed)
Patient was advised. KW 

## 2016-07-25 ENCOUNTER — Ambulatory Visit
Admission: RE | Admit: 2016-07-25 | Discharge: 2016-07-25 | Disposition: A | Discharge status: Home / Self Care | Payer: PPO | Source: Ambulatory Visit | Attending: Physician Assistant | Admitting: Physician Assistant

## 2016-07-25 DIAGNOSIS — Z1231 Encounter for screening mammogram for malignant neoplasm of breast: Secondary | ICD-10-CM | POA: Diagnosis not present

## 2016-08-05 DIAGNOSIS — R002 Palpitations: Secondary | ICD-10-CM | POA: Diagnosis not present

## 2016-08-05 DIAGNOSIS — E7800 Pure hypercholesterolemia, unspecified: Secondary | ICD-10-CM | POA: Diagnosis not present

## 2016-08-05 DIAGNOSIS — K648 Other hemorrhoids: Secondary | ICD-10-CM | POA: Diagnosis not present

## 2016-08-26 DIAGNOSIS — K5909 Other constipation: Secondary | ICD-10-CM | POA: Insufficient documentation

## 2016-09-04 ENCOUNTER — Ambulatory Visit
Admission: RE | Admit: 2016-09-04 | Discharge: 2016-09-04 | Disposition: A | Discharge status: Home / Self Care | Payer: PPO | Source: Ambulatory Visit | Attending: Cardiothoracic Surgery | Admitting: Cardiothoracic Surgery

## 2016-09-04 DIAGNOSIS — I700 Atherosclerosis of aorta: Secondary | ICD-10-CM | POA: Diagnosis not present

## 2016-09-04 DIAGNOSIS — R590 Localized enlarged lymph nodes: Secondary | ICD-10-CM | POA: Insufficient documentation

## 2016-09-04 DIAGNOSIS — I2510 Atherosclerotic heart disease of native coronary artery without angina pectoris: Secondary | ICD-10-CM | POA: Diagnosis not present

## 2016-09-04 DIAGNOSIS — R918 Other nonspecific abnormal finding of lung field: Secondary | ICD-10-CM

## 2016-09-04 DIAGNOSIS — J439 Emphysema, unspecified: Secondary | ICD-10-CM | POA: Diagnosis not present

## 2016-09-06 ENCOUNTER — Other Ambulatory Visit: Payer: Self-pay

## 2016-09-06 ENCOUNTER — Ambulatory Visit (INDEPENDENT_AMBULATORY_CARE_PROVIDER_SITE_OTHER): Payer: PPO | Admitting: Cardiothoracic Surgery

## 2016-09-06 ENCOUNTER — Encounter: Payer: Self-pay | Admitting: Cardiothoracic Surgery

## 2016-09-06 ENCOUNTER — Ambulatory Visit: Payer: Self-pay | Admitting: Cardiothoracic Surgery

## 2016-09-06 VITALS — BP 116/72 | HR 75 | Temp 97.8°F | Wt 139.0 lb

## 2016-09-06 DIAGNOSIS — R911 Solitary pulmonary nodule: Secondary | ICD-10-CM

## 2016-09-06 NOTE — Patient Instructions (Signed)
We will give you a call in a year to remind you of your CT Scan and then follow appointment with Dr. Genevive Bi.

## 2016-09-06 NOTE — Progress Notes (Signed)
  Patient ID: [name] [name], female   DOB: 06-Sep-1940, 76 y.o.   MRN: [id number]  HISTORY: This patient returns today in follow-up. She did have one episode of bronchitis or pneumonia at the end of last year for which she was successfully treated. She states that she is doing much better now and has occasional shortness of breath whenever she exerts herself but otherwise is getting along well.   Vitals:   09/06/16 1332  BP: 116/72  Pulse: 75  Temp: 97.8 F (36.6 C)     EXAM:    Resp: Lungs are clear bilaterally.  No respiratory distress, normal effort. Heart:  Regular without murmurs Abd:  Abdomen is soft, non distended and non tender. No masses are palpable.  There is no rebound and no guarding.  Neurological: Alert and oriented to person, place, and time. Coordination normal.  Skin: Skin is warm and dry. No rash noted. No diaphoretic. No erythema. No pallor.  Psychiatric: Normal mood and affect. Normal behavior. Judgment and thought content normal.    ASSESSMENT: I have independently reviewed the patient's CT scan. There is no change in the nodules that are present. I would like to see her back again in one year for further follow-up.   PLAN:   Repeat CT scan in one year    Nestor Lewandowsky, MD

## 2016-10-11 ENCOUNTER — Ambulatory Visit: Payer: Self-pay | Admitting: Orthopedic Surgery

## 2016-10-25 ENCOUNTER — Ambulatory Visit: Payer: Self-pay | Admitting: Orthopedic Surgery

## 2016-10-25 NOTE — H&P (Signed)
[name] [name] DOB: 02/26/1941 Married / Language: English / Race: White Female Date of Admission:  11/04/2016 CC:  Left Knee Pain History of Present Illness  The patient is a 75 year old female who comes in for a preoperative History and Physical. The patient is scheduled for a left total knee arthroplasty to be performed by Dr. Frank V. Aluisio, MD at [facility] on 11-04-2016. The patient is a 75 year old female who presented with left knee complaints. The patient was seen in referral from Dr.Kendall. The patient reports left knee symptoms including: pain, locking, stiffness and soreness which began 1 year(s) (and really bad in the past 3 months) ago without any known injury. The patient describes the severity of the symptoms as moderate in severity. The patient feels that the symptoms are worsening. The patient has the current diagnosis of knee osteoarthritis. Prior to being seen, the patient was previously evaluated by a primary care provider (She has seen Dr. Howard Miller with [facility] and was given a cortisone injection in October 2016.). Previous work-up for this problem has included knee x-rays. Symptoms are reported to be located in the left anterior knee and left lateral knee and include lateral knee pain, lateral knee tenderness, stiffness, difficulty bearing weight and difficulty ambulating. Current treatment includes application of ice and restricted activity. The knee is getting progressively worse over time. She said that it is limiting what she can and cannot do. She had her right knee replaced by Dr. Abbott about 15 years ago and gets some discomfort with that. 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 Left knee is at a stage where it is affecting her mobility and she is now ready to proceed with surgery at this time. They have been treated conservatively in the past for the above stated problem and despite  conservative measures, they continue to have progressive pain and severe functional limitations and dysfunction. They have failed non-operative management including home exercise, medications, and injections. It is felt that they would benefit from undergoing total joint replacement. Risks and benefits of the procedure have been discussed with the patient and they elect to proceed with surgery. There are no active contraindications to surgery such as ongoing infection or rapidly progressive neurological disease.   Problem List/Past Medical  Chronic pain of left knee (M25.562)  Primary osteoarthritis of left knee (M17.12)  PVC (premature ventricular contraction) (I49.3)  Blood Clot  Chronic Obstructive Lung Disease  Hypercholesterolemia  Hypothyroidism  Osteoarthritis  Osteoporosis  Pulmonary Embolism - likely due to HRT in the past Pneumonia  Varicose veins  Urinary Tract Infection  Degenerative Disc Disease  Measles   Allergies Benadryl *ANTIHISTAMINES*  hypotension and tachycardia CeleBREX *ANALGESICS - ANTI-INFLAMMATORY*  bruising Vioxx *ANALGESICS - ANTI-INFLAMMATORY*  Rash. Codeine Sulfate *ANALGESICS - OPIOID*  Nausea, Vomiting. Morphine Sulfate *ANALGESICS - OPIOID*  Nausea, Vomiting.  Family History Cancer  Sister. Cerebrovascular Accident  Maternal Grandmother, Mother, Paternal Grandmother. Chronic Obstructive Lung Disease  Sister. Diabetes Mellitus  Mother. Heart Disease  Father, Paternal Grandfather. Hypertension  Father, Maternal Grandmother, Mother, Paternal Grandmother. Osteoarthritis  Sister. Osteoporosis  Mother, Sister.  Social History  Children  2 Current drinker  05/06/2016: Currently drinks wine and hard liquor less than 5 times per week Current work status  retired Exercise  Exercises daily; does running / walking Living situation  live with spouse Marital status  married No history of drug/alcohol rehab    Not under  pain contract  Number of flights of stairs before winded  2-3 Tobacco / smoke exposure  05/06/2016: no Tobacco use  Former smoker. 05/06/2016: smoke(d) 3 more pack(s) per day Advance Directives  Living Will, Healthcare POA  Medication History  Triamcinolone Acetonide (0.1% Cream, External) Active. (prn) Ventolin HFA (108 (90 Base)MCG/ACT Aerosol Soln, Inhalation) Active. Fexofenadine HCl (60MG Tablet, Oral) Active. Nasocort Active. Aspirin (81MG Tablet Chewable, Oral) Active. Meloxicam (7.5MG Tablet, Oral) Active. Flecainide Acetate (50MG Tablet, Oral) Active. Levothyroxine Sodium (75MCG Tablet, Oral) Active. Spiriva HandiHaler (18MCG Capsule, Inhalation) Active. Advair Diskus (250-50MCG/DOSE Aero Pow Br Act, Inhalation) Active.  Past Surgical History Appendectomy  Date: 1962. Dilation and Curettage of Uterus  Date: 1968. Right Medial Meniscal Surgery  Date: 1972. Total Knee Replacement  Date: 2002. right Gallbladder Surgery  Date: 2005. laporoscopic Cataract Surgery  bilateral, 2015 and 2016   Review of Systems General Not Present- Chills, Fatigue, Fever, Memory Loss, Night Sweats, Weight Gain and Weight Loss. Skin Not Present- Eczema, Hives, Itching, Lesions and Rash. HEENT Not Present- Dentures, Double Vision, Headache, Hearing Loss, Tinnitus and Visual Loss. Respiratory Present- Cough and Shortness of breath with exertion. Not Present- Allergies, Chronic Cough, Coughing up blood and Shortness of breath at rest. Cardiovascular Not Present- Chest Pain, Difficulty Breathing Lying Down, Murmur, Palpitations, Racing/skipping heartbeats and Swelling. Gastrointestinal Present- Constipation, Difficulty Swallowing and Heartburn. Not Present- Abdominal Pain, Bloody Stool, Diarrhea, Jaundice, Loss of appetitie, Nausea and Vomiting. Female Genitourinary Present- Urinary frequency and Urinating at Night. Not Present- Blood in Urine, Discharge, Flank Pain, Incontinence,  Painful Urination, Urgency, Urinary Retention and Weak urinary stream. Musculoskeletal Present- Back Pain, Joint Pain and Joint Swelling. Not Present- Morning Stiffness, Muscle Pain, Muscle Weakness and Spasms. Neurological Not Present- Blackout spells, Difficulty with balance, Dizziness, Paralysis, Tremor and Weakness. Psychiatric Not Present- Insomnia.  Vitals Weight: 132 lb Height: 63in Weight was reported by patient. Height was reported by patient. Body Surface Area: 1.62 m Body Mass Index: 23.38 kg/m  Pulse: 68 (Regular)  Resp.: 14 (Unlabored)  BP: 132/72 (Sitting, Right Arm, Standard)   Physical Exam  General Mental Status -Alert, cooperative and good historian. General Appearance-pleasant, Not in acute distress. Orientation-Oriented X3. Build & Nutrition-Well nourished and Well developed.  Head and Neck Head-normocephalic, atraumatic . Neck Global Assessment - supple, no bruit auscultated on the right, no bruit auscultated on the left.  Eye Pupil - Bilateral-Regular and Round. Motion - Bilateral-EOMI.  Chest and Lung Exam Auscultation Breath sounds - clear at anterior chest wall and clear at posterior chest wall. Adventitious sounds - No Adventitious sounds.  Cardiovascular Auscultation Rhythm - Regular rate and rhythm. Heart Sounds - S1 WNL and S2 WNL. Murmurs & Other Heart Sounds - Auscultation of the heart reveals - No Murmurs.  Abdomen Palpation/Percussion Tenderness - Abdomen is non-tender to palpation. Rigidity (guarding) - Abdomen is soft. Auscultation Auscultation of the abdomen reveals - Bowel sounds normal.  Female Genitourinary Note: Not done, not pertinent to present illness   Musculoskeletal Note: Very pleasant, well-developed female, alert and oriented, in no apparent distress. Evaluation of her hips show normal range of motion with no discomfort. Right knee range is about 0 to 105. No tenderness or instability. Left  knee, varus deformity, range about 5 to 125, marked crepitus on range of motion with tenderness medial greater than lateral with no instability noted. Pulse, sensation, and motor are intact. Gait pattern is significantly antalgic on the left.  RADIOGRAPHS AP of both knees, lateral of   the left showed that she has got significant arthritic change throughout the left knee worse in the lateral and patellofemoral compartments.   Assessment & Plan Primary osteoarthritis of left knee (M17.12)  Note:Surgical Plans: Left Total Knee Replacement  Disposition: Home with family.  She wants to start with HHPT thru [facility]  PCP: Dr. Richard Gilbert Cards: Dr. Kenneth Fath  Topical TXA - History of PE  Anesthesia Issues: Slow to wake up in the past  Patient was instructed on what medications to stop prior to surgery.  Signed electronically by Alezandrew L Dareth Andrew, III PA-C   

## 2016-10-29 ENCOUNTER — Other Ambulatory Visit (HOSPITAL_COMMUNITY): Payer: Self-pay | Admitting: Emergency Medicine

## 2016-10-29 ENCOUNTER — Ambulatory Visit (INDEPENDENT_AMBULATORY_CARE_PROVIDER_SITE_OTHER): Payer: PPO | Admitting: Physician Assistant

## 2016-10-29 ENCOUNTER — Encounter: Payer: Self-pay | Admitting: Physician Assistant

## 2016-10-29 VITALS — BP 140/70 | HR 74 | Temp 97.7°F | Resp 16 | Ht 63.0 in | Wt 136.2 lb

## 2016-10-29 DIAGNOSIS — Z01818 Encounter for other preprocedural examination: Secondary | ICD-10-CM | POA: Diagnosis not present

## 2016-10-29 NOTE — Progress Notes (Addendum)
EKG 08-05-16 chart LOV cardio Dr Ubaldo Glassing 08-05-16 chart Stress Test 07-13-15 on chart Medical clearance [place] 10-29-16 on chart Cardiac clearance Dr Ubaldo Glassing 10-31-16 on chart

## 2016-10-29 NOTE — Progress Notes (Signed)
Patient: [name] [name] Female    DOB: 02-27-1941   76 y.o.   MRN: [id number] Visit Date: 10/29/2016  Today's Provider: Mar Daring, PA-C   Chief Complaint  Patient presents with  . Pre-op Exam   Subjective:    HPI  Normal Kin is a 76 yr old female that comes to the office today for pre-op clearance for left knee total arthroplasty by Dr. Wynelle Link on 11/04/16.   Pre-Procedure   A History and Physical has been performed and patient medication allergies have been reviewed. The patient's tolerance of previous anesthesia has been reviewed. The risks and benefits of the procedure and the sedation options and risks were discussed with the patient. All questions were answered. Visit Vitals: BP 140/70 (BP Location: Right Arm, Patient Position: Sitting, Cuff Size: Normal)   Pulse 74   Temp 97.7 F (36.5 C) (Oral)   Resp 16   Ht 5\' 3"  (1.6 m)   Wt 136 lb 3.2 oz (61.8 kg)   BMI 24.13 kg/m   ASA Grade Assessment: ASA 2 - Patient with mild systemic disease with no functional limitations     COPD: controlled. Patient walks 5 miles a day. SpO2:96% room temperature. Last Chest CT done by Dr. Genevive Bi on 09/06/16 and was stable with no changes. Return in one year for repeat Chest CT for stable lung nodules and COPD.   She is followed by Dr. Ubaldo Glassing, Cardiology. She was last seen in Dec 2017 with EKG. At that time there were no significant changes in EKG compared to previous EKG in 2016. Prior to surgery, however, Dr. Ubaldo Glassing has requested myoview stress echocardiogram which is scheduled for Thursday 10/31/16.    Allergies  Allergen Reactions  . Codeine   . Diphenhydramine     Decreased BP and pulse rate  . Montelukast Sodium     Insomnia  . Morphine Sulfate   . Nsaids     Can take Meloxicam, Naproxen  . Other Other (See Comments)    Opiates-nausea/vomiting  Spandex-itching/rash/blisters  Latex-itching  . Tramadol     Mental status change  . Celecoxib Rash    Epigastric pain  .  Rofecoxib Rash    Epigastric pain (vioxx)     Current Outpatient Prescriptions:  .  acetaminophen (TYLENOL) 500 MG tablet, Take 500 mg by mouth at bedtime., Disp: , Rfl:  .  ADVAIR DISKUS 250-50 MCG/DOSE AEPB, Inhale 1 puff into the lungs 2 (two) times daily., Disp: , Rfl:  .  aspirin 81 MG chewable tablet, Chew 81 mg by mouth daily., Disp: , Rfl:  .  calcium carbonate (TUMS - DOSED IN MG ELEMENTAL CALCIUM) 500 MG chewable tablet, Chew 1 tablet by mouth 3 (three) times daily as needed for indigestion or heartburn., Disp: , Rfl:  .  flecainide (TAMBOCOR) 50 MG tablet, Take 50 mg by mouth 2 (two) times daily., Disp: , Rfl:  .  levothyroxine (SYNTHROID, LEVOTHROID) 75 MCG tablet, Take 1 tablet (75 mcg total) by mouth daily., Disp: 90 tablet, Rfl: 3 .  meloxicam (MOBIC) 7.5 MG tablet, Take 1 tablet by mouth daily as needed for pain. , Disp: , Rfl:  .  Multiple Vitamin (MULTIVITAMIN WITH MINERALS) TABS tablet, Take 1 tablet by mouth daily., Disp: , Rfl:  .  Polyethyl Glycol-Propyl Glycol (SYSTANE OP), Apply 1-2 drops to eye 3 (three) times daily as needed (for dry eyes)., Disp: , Rfl:  .  polyethylene glycol (MIRALAX / GLYCOLAX) packet, Take  17 g by mouth every other day as needed (for constipation)., Disp: , Rfl:  .  tiotropium (SPIRIVA HANDIHALER) 18 MCG inhalation capsule, Place 1 capsule (18 mcg total) into inhaler and inhale daily., Disp: 90 capsule, Rfl: 3 .  Triamcinolone Acetonide (NASACORT AQ NA), Place 2 sprays into both nostrils daily., Disp: , Rfl:  .  albuterol (VENTOLIN HFA) 108 (90 Base) MCG/ACT inhaler, Inhale 1-2 puffs into the lungs every 4 (four) hours as needed for wheezing or shortness of breath. Reported on 12/04/2015 (Patient not taking: Reported on 10/29/2016), Disp: 1 Inhaler, Rfl: 12 .  benzonatate (TESSALON) 100 MG capsule, Take 1 capsule (100 mg total) by mouth daily. (Patient not taking: Reported on 10/29/2016), Disp: 30 capsule, Rfl: 1 .  fexofenadine (ALLEGRA) 180 MG tablet,  Take 1 tablet by mouth daily as needed for allergies. , Disp: , Rfl:   Review of Systems  Constitutional: Negative.   HENT: Negative.   Respiratory: Negative.   Cardiovascular: Positive for palpitations (she is on medicine ) and leg swelling. Negative for chest pain.  Gastrointestinal: Negative.   Genitourinary: Negative.   Musculoskeletal: Positive for arthralgias and joint swelling. Negative for back pain, gait problem, myalgias, neck pain and neck stiffness.  Neurological: Negative.   Psychiatric/Behavioral: Negative.     Social History  Substance Use Topics  . Smoking status: Former Smoker    Quit date: 08/25/1998  . Smokeless tobacco: Never Used  . Alcohol use 1.2 oz/week    2 Glasses of wine per week     Comment: liquor occasionally   Objective:   BP 140/70 (BP Location: Right Arm, Patient Position: Sitting, Cuff Size: Normal)   Pulse 74   Temp 97.7 F (36.5 C) (Oral)   Resp 16   Ht 5\' 3"  (1.6 m)   Wt 136 lb 3.2 oz (61.8 kg)   BMI 24.13 kg/m    Physical Exam  Constitutional: She is oriented to person, place, and time. She appears well-developed and well-nourished. No distress.  HENT:  Head: Normocephalic and atraumatic.  Right Ear: Hearing, tympanic membrane, external ear and ear canal normal.  Left Ear: Hearing, tympanic membrane, external ear and ear canal normal.  Nose: Nose normal.  Mouth/Throat: Uvula is midline, oropharynx is clear and moist and mucous membranes are normal. No oropharyngeal exudate.  Eyes: Conjunctivae and EOM are normal. Pupils are equal, round, and reactive to light. Right eye exhibits no discharge. Left eye exhibits no discharge. No scleral icterus.  Neck: Normal range of motion. Neck supple. No JVD present. No tracheal deviation present. No thyromegaly present.  Cardiovascular: Normal rate, regular rhythm, normal heart sounds and intact distal pulses.  Exam reveals no gallop and no friction rub.   No murmur heard. Pulmonary/Chest: Effort  normal and breath sounds normal. No respiratory distress. She has no decreased breath sounds. She has no wheezes. She has no rales. She exhibits no tenderness.  Abdominal: Soft. Bowel sounds are normal. She exhibits no distension and no mass. There is no tenderness. There is no rebound and no guarding.  Musculoskeletal: Normal range of motion. She exhibits no edema or tenderness.  Lymphadenopathy:    She has no cervical adenopathy.  Neurological: She is alert and oriented to person, place, and time.  Skin: Skin is warm and dry. No rash noted. She is not diaphoretic.  Psychiatric: She has a normal mood and affect. Her behavior is normal. Judgment and thought content normal.  Vitals reviewed.      Assessment &  Plan:     1. Pre-op exam Reviewed patient's last EKG and CT Chest. Reviewed Dr. Bethanne Ginger and Dr. Soyla Dryer last office visit notes. Dr. Ubaldo Glassing is requesting patient have a Myoview nuclear stress test which is scheduled on 10/31/16. Surgical clearance pending stress test results with Dr. Ubaldo Glassing. Otherwise patient should be moderate risk due to COPD history.        Mar Daring, PA-C  [facility]

## 2016-10-29 NOTE — Patient Instructions (Signed)
[name] [name]  10/29/2016   Your procedure is scheduled on: 11-04-16  Report to [facility] Main  Entrance take [facility]  elevators to 3rd floor to  [place] at 6AM.  Call this number if you have problems the morning of surgery [phone]   Remember: ONLY 1 PERSON MAY GO WITH YOU TO SHORT STAY TO GET  READY MORNING OF [place].  Do not eat food or drink liquids :After Midnight.     Take these medicines the morning of surgery with A SIP OF WATER: flecainide(Tambocor), levothyroxine(synthroid), inhalers as needed, nasal spray as needed                                 You may not have any metal on your body including hair pins and              piercings  Do not wear jewelry, make-up, lotions, powders or perfumes, deodorant             Do not wear nail polish.  Do not shave  48 hours prior to surgery.              Men may shave face and neck.   Do not bring valuables to the hospital. [place].  Contacts, dentures or bridgework may not be worn into surgery.  Leave suitcase in the car. After surgery it may be brought to your room.               Please read over the following fact sheets you were given: _____________________________________________________________________             [facility] - Preparing for Surgery Before surgery, you can play an important role.  Because skin is not sterile, your skin needs to be as free of germs as possible.  You can reduce the number of germs on your skin by washing with CHG (chlorahexidine gluconate) soap before surgery.  CHG is an antiseptic cleaner which kills germs and bonds with the skin to continue killing germs even after washing. Please DO NOT use if you have an allergy to CHG or antibacterial soaps.  If your skin becomes reddened/irritated stop using the CHG and inform your nurse when you arrive at Short Stay. Do not shave (including legs and underarms) for  at least 48 hours prior to the first CHG shower.  You may shave your face/neck. Please follow these instructions carefully:  1.  Shower with CHG Soap the night before surgery and the  morning of Surgery.  2.  If you choose to wash your hair, wash your hair first as usual with your  normal  shampoo.  3.  After you shampoo, rinse your hair and body thoroughly to remove the  shampoo.                           4.  Use CHG as you would any other liquid soap.  You can apply chg directly  to the skin and wash                       Gently with a scrungie or clean washcloth.  5.  Apply the CHG Soap to your body ONLY FROM THE NECK DOWN.   Do not use on face/ open                           Wound or open sores. Avoid contact with eyes, ears mouth and genitals (private parts).                       Wash face,  Genitals (private parts) with your normal soap.             6.  Wash thoroughly, paying special attention to the area where your surgery  will be performed.  7.  Thoroughly rinse your body with warm water from the neck down.  8.  DO NOT shower/wash with your normal soap after using and rinsing off  the CHG Soap.                9.  Pat yourself dry with a clean towel.            10.  Wear clean pajamas.            11.  Place clean sheets on your bed the night of your first shower and do not  sleep with pets. Day of Surgery : Do not apply any lotions/deodorants the morning of surgery.  Please wear clean clothes to the hospital/surgery center.  FAILURE TO FOLLOW THESE INSTRUCTIONS MAY RESULT IN THE CANCELLATION OF YOUR SURGERY PATIENT SIGNATURE_________________________________  NURSE SIGNATURE__________________________________  ________________________________________________________________________   [name]  An incentive spirometer is a tool that can help keep your lungs clear and active. This tool measures how well you are filling your lungs with each breath. Taking long deep  breaths may help reverse or decrease the chance of developing breathing (pulmonary) problems (especially infection) following:  A long period of time when you are unable to move or be active. BEFORE THE PROCEDURE   If the spirometer includes an indicator to show your best effort, your nurse or respiratory therapist will set it to a desired goal.  If possible, sit up straight or lean slightly forward. Try not to slouch.  Hold the incentive spirometer in an upright position. INSTRUCTIONS FOR USE  1. Sit on the edge of your bed if possible, or sit up as far as you can in bed or on a chair. 2. Hold the incentive spirometer in an upright position. 3. Breathe out normally. 4. Place the mouthpiece in your mouth and seal your lips tightly around it. 5. Breathe in slowly and as deeply as possible, raising the piston or the ball toward the top of the column. 6. Hold your breath for 3-5 seconds or for as long as possible. Allow the piston or ball to fall to the bottom of the column. 7. Remove the mouthpiece from your mouth and breathe out normally. 8. Rest for a few seconds and repeat Steps 1 through 7 at least 10 times every 1-2 hours when you are awake. Take your time and take a few normal breaths between deep breaths. 9. The spirometer may include an indicator to show your best effort. Use the indicator as a goal to work toward during each repetition. 10. After each set of 10 deep breaths, practice coughing to be sure your lungs are clear. If you have an incision (the cut made at the time of surgery), support your incision when coughing by placing a pillow or  rolled up towels firmly against it. Once you are able to get out of bed, walk around indoors and cough well. You may stop using the incentive spirometer when instructed by your caregiver.  RISKS AND COMPLICATIONS  Take your time so you do not get dizzy or light-headed.  If you are in pain, you may need to take or ask for pain medication before  doing incentive spirometry. It is harder to take a deep breath if you are having pain. AFTER USE  Rest and breathe slowly and easily.  It can be helpful to keep track of a log of your progress. Your caregiver can provide you with a simple table to help with this. If you are using the spirometer at home, follow these instructions: [place] IF:   You are having difficultly using the spirometer.  You have trouble using the spirometer as often as instructed.  Your pain medication is not giving enough relief while using the spirometer.  You develop fever of 100.5 F (38.1 C) or higher. SEEK IMMEDIATE MEDICAL CARE IF:   You cough up bloody sputum that had not been present before.  You develop fever of 102 F (38.9 C) or greater.  You develop worsening pain at or near the incision site. MAKE SURE YOU:   Understand these instructions.  Will watch your condition.  Will get help right away if you are not doing well or get worse. Document Released: 12/23/2006 Document Revised: 11/04/2011 Document Reviewed: 02/23/2007 ExitCare Patient Information 2014 ExitCare, [place].   ________________________________________________________________________  WHAT IS A BLOOD TRANSFUSION? Blood Transfusion Information  A transfusion is the replacement of blood or some of its parts. Blood is made up of multiple cells which provide different functions.  Red blood cells carry oxygen and are used for blood loss replacement.  White blood cells fight against infection.  Platelets control bleeding.  Plasma helps clot blood.  Other blood products are available for specialized needs, such as hemophilia or other clotting disorders. BEFORE THE TRANSFUSION  Who gives blood for transfusions?   Healthy volunteers who are fully evaluated to make sure their blood is safe. This is blood bank blood. Transfusion therapy is the safest it has ever been in the practice of medicine. Before blood is taken  from a donor, a complete history is taken to make sure that person has no history of diseases nor engages in risky social behavior (examples are intravenous drug use or sexual activity with multiple partners). The donor's travel history is screened to minimize risk of transmitting infections, such as malaria. The donated blood is tested for signs of infectious diseases, such as HIV and hepatitis. The blood is then tested to be sure it is compatible with you in order to minimize the chance of a transfusion reaction. If you or a relative donates blood, this is often done in anticipation of surgery and is not appropriate for emergency situations. It takes many days to process the donated blood. RISKS AND COMPLICATIONS Although transfusion therapy is very safe and saves many lives, the main dangers of transfusion include:   Getting an infectious disease.  Developing a transfusion reaction. This is an allergic reaction to something in the blood you were given. Every precaution is taken to prevent this. The decision to have a blood transfusion has been considered carefully by your caregiver before blood is given. Blood is not given unless the benefits outweigh the risks. AFTER THE TRANSFUSION  Right after receiving a blood transfusion, you will usually  feel much better and more energetic. This is especially true if your red blood cells have gotten low (anemic). The transfusion raises the level of the red blood cells which carry oxygen, and this usually causes an energy increase.  The nurse administering the transfusion will monitor you carefully for complications. HOME CARE INSTRUCTIONS  No special instructions are needed after a transfusion. You may find your energy is better. Speak with your caregiver about any limitations on activity for underlying diseases you may have. SEEK MEDICAL CARE IF:   Your condition is not improving after your transfusion.  You develop redness or irritation at the  intravenous (IV) site. SEEK IMMEDIATE MEDICAL CARE IF:  Any of the following symptoms occur over the next 12 hours:  Shaking chills.  You have a temperature by mouth above 102 F (38.9 C), not controlled by medicine.  Chest, back, or muscle pain.  People around you feel you are not acting correctly or are confused.  Shortness of breath or difficulty breathing.  Dizziness and fainting.  You get a rash or develop hives.  You have a decrease in urine output.  Your urine turns a dark color or changes to pink, red, or brown. Any of the following symptoms occur over the next 10 days:  You have a temperature by mouth above 102 F (38.9 C), not controlled by medicine.  Shortness of breath.  Weakness after normal activity.  The white part of the eye turns yellow (jaundice).  You have a decrease in the amount of urine or are urinating less often.  Your urine turns a dark color or changes to pink, red, or brown. Document Released: 08/09/2000 Document Revised: 11/04/2011 Document Reviewed: 03/28/2008 [facility] Patient Information 2014 [place], [place].  _______________________________________________________________________

## 2016-10-30 ENCOUNTER — Encounter (HOSPITAL_COMMUNITY)
Admission: RE | Admit: 2016-10-30 | Discharge: 2016-10-30 | Disposition: A | Discharge status: Home / Self Care | Payer: PPO | Source: Ambulatory Visit | Attending: Orthopedic Surgery | Admitting: Orthopedic Surgery

## 2016-10-30 ENCOUNTER — Encounter (HOSPITAL_COMMUNITY): Payer: Self-pay

## 2016-10-30 DIAGNOSIS — Z01818 Encounter for other preprocedural examination: Secondary | ICD-10-CM | POA: Insufficient documentation

## 2016-10-30 DIAGNOSIS — M1712 Unilateral primary osteoarthritis, left knee: Secondary | ICD-10-CM | POA: Insufficient documentation

## 2016-10-30 HISTORY — DX: Family history of other specified conditions: Z84.89

## 2016-10-30 HISTORY — DX: Adverse effect of unspecified anesthetic, initial encounter: T41.45XA

## 2016-10-30 HISTORY — DX: Other complications of anesthesia, initial encounter: T88.59XA

## 2016-10-30 HISTORY — DX: Peripheral vascular disease, unspecified: I73.9

## 2016-10-30 LAB — CBC
HCT: 43.8 % (ref 36.0–46.0)
HEMOGLOBIN: 15.1 g/dL — AB (ref 12.0–15.0)
MCH: 32.9 pg (ref 26.0–34.0)
MCHC: 34.5 g/dL (ref 30.0–36.0)
MCV: 95.4 fL (ref 78.0–100.0)
Platelets: 209 10*3/uL (ref 150–400)
RBC: 4.59 MIL/uL (ref 3.87–5.11)
RDW: 12.7 % (ref 11.5–15.5)
WBC: 5 10*3/uL (ref 4.0–10.5)

## 2016-10-30 LAB — COMPREHENSIVE METABOLIC PANEL
ALBUMIN: 4 g/dL (ref 3.5–5.0)
ALK PHOS: 51 U/L (ref 38–126)
ALT: 24 U/L (ref 14–54)
ANION GAP: 4 — AB (ref 5–15)
AST: 25 U/L (ref 15–41)
BUN: 13 mg/dL (ref 6–20)
CO2: 31 mmol/L (ref 22–32)
CREATININE: 0.63 mg/dL (ref 0.44–1.00)
Calcium: 9.3 mg/dL (ref 8.9–10.3)
Chloride: 103 mmol/L (ref 101–111)
GFR calc non Af Amer: >60 mL/min (ref >60)
GLUCOSE: 99 mg/dL (ref 65–99)
Potassium: 4.2 mmol/L (ref 3.5–5.1)
SODIUM: 138 mmol/L (ref 135–145)
Total Bilirubin: 1.1 mg/dL (ref 0.3–1.2)
Total Protein: 7 g/dL (ref 6.5–8.1)

## 2016-10-30 LAB — SURGICAL PCR SCREEN
MRSA, PCR: NEGATIVE
Staphylococcus aureus: NEGATIVE

## 2016-10-30 LAB — PROTIME-INR
INR: 0.94
Prothrombin Time: 12.6 seconds (ref 11.4–15.2)

## 2016-10-30 LAB — ABO/RH: ABO/RH(D): A NEG

## 2016-10-30 LAB — APTT: APTT: 27 s (ref 24–36)

## 2016-10-31 DIAGNOSIS — Z01818 Encounter for other preprocedural examination: Secondary | ICD-10-CM | POA: Diagnosis not present

## 2016-11-01 NOTE — Progress Notes (Signed)
Medical clearance note dr Ubaldo Glassing on chart for 11-04-16 surgery

## 2016-11-04 ENCOUNTER — Encounter (HOSPITAL_COMMUNITY)
Admission: RE | Disposition: A | Discharge status: Home Health | Payer: Self-pay | Source: Ambulatory Visit | Attending: Orthopedic Surgery

## 2016-11-04 ENCOUNTER — Inpatient Hospital Stay (HOSPITAL_COMMUNITY): Payer: PPO | Admitting: Anesthesiology

## 2016-11-04 ENCOUNTER — Encounter (HOSPITAL_COMMUNITY): Payer: Self-pay

## 2016-11-04 ENCOUNTER — Inpatient Hospital Stay (HOSPITAL_COMMUNITY)
Admission: RE | Admit: 2016-11-04 | Discharge: 2016-11-06 | DRG: 470 | Disposition: A | Discharge status: Home Health | Payer: PPO | Source: Ambulatory Visit | Attending: Orthopedic Surgery | Admitting: Orthopedic Surgery

## 2016-11-04 DIAGNOSIS — Z8262 Family history of osteoporosis: Secondary | ICD-10-CM | POA: Diagnosis not present

## 2016-11-04 DIAGNOSIS — Z791 Long term (current) use of non-steroidal anti-inflammatories (NSAID): Secondary | ICD-10-CM | POA: Diagnosis not present

## 2016-11-04 DIAGNOSIS — M1712 Unilateral primary osteoarthritis, left knee: Principal | ICD-10-CM | POA: Diagnosis present

## 2016-11-04 DIAGNOSIS — I493 Ventricular premature depolarization: Secondary | ICD-10-CM | POA: Diagnosis present

## 2016-11-04 DIAGNOSIS — Z91048 Other nonmedicinal substance allergy status: Secondary | ICD-10-CM | POA: Diagnosis not present

## 2016-11-04 DIAGNOSIS — G8929 Other chronic pain: Secondary | ICD-10-CM | POA: Diagnosis not present

## 2016-11-04 DIAGNOSIS — Z87891 Personal history of nicotine dependence: Secondary | ICD-10-CM

## 2016-11-04 DIAGNOSIS — M5136 Other intervertebral disc degeneration, lumbar region: Secondary | ICD-10-CM | POA: Diagnosis present

## 2016-11-04 DIAGNOSIS — Z7982 Long term (current) use of aspirin: Secondary | ICD-10-CM

## 2016-11-04 DIAGNOSIS — I739 Peripheral vascular disease, unspecified: Secondary | ICD-10-CM | POA: Diagnosis not present

## 2016-11-04 DIAGNOSIS — J449 Chronic obstructive pulmonary disease, unspecified: Secondary | ICD-10-CM | POA: Diagnosis present

## 2016-11-04 DIAGNOSIS — Z888 Allergy status to other drugs, medicaments and biological substances status: Secondary | ICD-10-CM | POA: Diagnosis not present

## 2016-11-04 DIAGNOSIS — Z885 Allergy status to narcotic agent status: Secondary | ICD-10-CM | POA: Diagnosis not present

## 2016-11-04 DIAGNOSIS — M1710 Unilateral primary osteoarthritis, unspecified knee: Secondary | ICD-10-CM | POA: Diagnosis present

## 2016-11-04 DIAGNOSIS — Z91040 Latex allergy status: Secondary | ICD-10-CM

## 2016-11-04 DIAGNOSIS — Z86711 Personal history of pulmonary embolism: Secondary | ICD-10-CM | POA: Diagnosis not present

## 2016-11-04 DIAGNOSIS — Z96651 Presence of right artificial knee joint: Secondary | ICD-10-CM | POA: Diagnosis not present

## 2016-11-04 DIAGNOSIS — M810 Age-related osteoporosis without current pathological fracture: Secondary | ICD-10-CM | POA: Diagnosis not present

## 2016-11-04 DIAGNOSIS — G8918 Other acute postprocedural pain: Secondary | ICD-10-CM | POA: Diagnosis not present

## 2016-11-04 DIAGNOSIS — E039 Hypothyroidism, unspecified: Secondary | ICD-10-CM | POA: Diagnosis not present

## 2016-11-04 DIAGNOSIS — Z886 Allergy status to analgesic agent status: Secondary | ICD-10-CM

## 2016-11-04 DIAGNOSIS — Z7951 Long term (current) use of inhaled steroids: Secondary | ICD-10-CM

## 2016-11-04 DIAGNOSIS — Z79899 Other long term (current) drug therapy: Secondary | ICD-10-CM

## 2016-11-04 DIAGNOSIS — M179 Osteoarthritis of knee, unspecified: Secondary | ICD-10-CM | POA: Diagnosis present

## 2016-11-04 HISTORY — PX: TOTAL KNEE ARTHROPLASTY: SHX125

## 2016-11-04 LAB — TYPE AND SCREEN
ABO/RH(D): A NEG
Antibody Screen: NEGATIVE

## 2016-11-04 SURGERY — ARTHROPLASTY, KNEE, TOTAL
Anesthesia: Monitor Anesthesia Care | Site: Knee | Laterality: Left

## 2016-11-04 MED ORDER — FLECAINIDE ACETATE 50 MG PO TABS
50.0000 mg | ORAL_TABLET | Freq: Two times a day (BID) | ORAL | Status: DC
  Administered 2016-11-04 – 2016-11-06 (×4): 50 mg via ORAL
  Filled 2016-11-04 (×4): qty 1

## 2016-11-04 MED ORDER — TIOTROPIUM BROMIDE MONOHYDRATE 18 MCG IN CAPS
1.0000 | ORAL_CAPSULE | Freq: Every day | RESPIRATORY_TRACT | Status: DC
  Administered 2016-11-05 – 2016-11-06 (×2): 18 ug via RESPIRATORY_TRACT
  Filled 2016-11-04: qty 5

## 2016-11-04 MED ORDER — GABAPENTIN 300 MG PO CAPS
300.0000 mg | ORAL_CAPSULE | Freq: Three times a day (TID) | ORAL | Status: DC
  Administered 2016-11-04 – 2016-11-06 (×5): 300 mg via ORAL
  Filled 2016-11-04 (×5): qty 1

## 2016-11-04 MED ORDER — TRANEXAMIC ACID 1000 MG/10ML IV SOLN
2000.0000 mg | Freq: Once | INTRAVENOUS | Status: DC
  Filled 2016-11-04: qty 20

## 2016-11-04 MED ORDER — LACTATED RINGERS IV SOLN
INTRAVENOUS | Status: DC
  Administered 2016-11-04 (×3): via INTRAVENOUS

## 2016-11-04 MED ORDER — SODIUM CHLORIDE 0.9 % IR SOLN
Status: DC | PRN
  Administered 2016-11-04: 1000 mL

## 2016-11-04 MED ORDER — BUPIVACAINE LIPOSOME 1.3 % IJ SUSP
INTRAMUSCULAR | Status: DC | PRN
  Administered 2016-11-04: 20 mL

## 2016-11-04 MED ORDER — SODIUM CHLORIDE 0.9 % IV SOLN
INTRAVENOUS | Status: DC
  Administered 2016-11-04 – 2016-11-05 (×2): via INTRAVENOUS

## 2016-11-04 MED ORDER — FENTANYL CITRATE (PF) 100 MCG/2ML IJ SOLN
25.0000 ug | INTRAMUSCULAR | Status: DC | PRN
  Administered 2016-11-04: 50 ug via INTRAVENOUS

## 2016-11-04 MED ORDER — PHENOL 1.4 % MT LIQD
1.0000 | OROMUCOSAL | Status: DC | PRN
  Filled 2016-11-04: qty 177

## 2016-11-04 MED ORDER — FENTANYL CITRATE (PF) 250 MCG/5ML IJ SOLN
INTRAMUSCULAR | Status: AC
  Filled 2016-11-04: qty 5

## 2016-11-04 MED ORDER — PROPOFOL 10 MG/ML IV BOLUS
INTRAVENOUS | Status: AC
  Filled 2016-11-04: qty 20

## 2016-11-04 MED ORDER — MOMETASONE FURO-FORMOTEROL FUM 200-5 MCG/ACT IN AERO
2.0000 | INHALATION_SPRAY | Freq: Two times a day (BID) | RESPIRATORY_TRACT | Status: DC
  Filled 2016-11-04: qty 8.8

## 2016-11-04 MED ORDER — SODIUM CHLORIDE 0.9 % IV SOLN
INTRAVENOUS | Status: DC | PRN
  Administered 2016-11-04: 2000 mg via TOPICAL

## 2016-11-04 MED ORDER — ALBUTEROL SULFATE (2.5 MG/3ML) 0.083% IN NEBU: 3.0000 mL | INHALATION_SOLUTION | RESPIRATORY_TRACT | Status: DC | PRN

## 2016-11-04 MED ORDER — FENTANYL CITRATE (PF) 100 MCG/2ML IJ SOLN
INTRAMUSCULAR | Status: DC | PRN
  Administered 2016-11-04: 100 ug via INTRAVENOUS

## 2016-11-04 MED ORDER — ONDANSETRON HCL 4 MG/2ML IJ SOLN: INTRAMUSCULAR | Status: DC | PRN

## 2016-11-04 MED ORDER — ONDANSETRON HCL 4 MG/2ML IJ SOLN: 4.0000 mg | Freq: Four times a day (QID) | INTRAMUSCULAR | Status: DC | PRN

## 2016-11-04 MED ORDER — ACETAMINOPHEN 500 MG PO TABS
1000.0000 mg | ORAL_TABLET | Freq: Four times a day (QID) | ORAL | Status: AC
  Administered 2016-11-04 – 2016-11-05 (×4): 1000 mg via ORAL
  Filled 2016-11-04 (×4): qty 2

## 2016-11-04 MED ORDER — CEFAZOLIN SODIUM-DEXTROSE 2-4 GM/100ML-% IV SOLN
2.0000 g | INTRAVENOUS | Status: AC
  Administered 2016-11-04: 2 g via INTRAVENOUS
  Filled 2016-11-04: qty 100

## 2016-11-04 MED ORDER — CEFAZOLIN SODIUM-DEXTROSE 2-4 GM/100ML-% IV SOLN
2.0000 g | Freq: Four times a day (QID) | INTRAVENOUS | Status: AC
  Administered 2016-11-04 (×2): 2 g via INTRAVENOUS
  Filled 2016-11-04 (×2): qty 100

## 2016-11-04 MED ORDER — LIDOCAINE 2% (20 MG/ML) 5 ML SYRINGE
INTRAMUSCULAR | Status: AC
  Filled 2016-11-04: qty 5

## 2016-11-04 MED ORDER — MENTHOL 3 MG MT LOZG: 1.0000 | LOZENGE | OROMUCOSAL | Status: DC | PRN

## 2016-11-04 MED ORDER — MIDAZOLAM HCL 5 MG/5ML IJ SOLN
INTRAMUSCULAR | Status: DC | PRN
  Administered 2016-11-04 (×2): 1 mg via INTRAVENOUS

## 2016-11-04 MED ORDER — LORATADINE 10 MG PO TABS: 10.0000 mg | ORAL_TABLET | Freq: Every day | ORAL | Status: DC | PRN

## 2016-11-04 MED ORDER — ACETAMINOPHEN 10 MG/ML IV SOLN
1000.0000 mg | Freq: Once | INTRAVENOUS | Status: AC
  Administered 2016-11-04: 1000 mg via INTRAVENOUS

## 2016-11-04 MED ORDER — SODIUM CHLORIDE 0.9 % IJ SOLN
INTRAMUSCULAR | Status: AC
  Filled 2016-11-04: qty 50

## 2016-11-04 MED ORDER — HYDROMORPHONE HCL 2 MG PO TABS
2.0000 mg | ORAL_TABLET | ORAL | Status: DC | PRN
  Administered 2016-11-04: 16:00:00 2 mg via ORAL
  Administered 2016-11-04 – 2016-11-06 (×10): 4 mg via ORAL
  Filled 2016-11-04 (×3): qty 2
  Filled 2016-11-04: qty 1
  Filled 2016-11-04 (×7): qty 2

## 2016-11-04 MED ORDER — METOCLOPRAMIDE HCL 5 MG PO TABS: 5.0000 mg | ORAL_TABLET | Freq: Three times a day (TID) | ORAL | Status: DC | PRN

## 2016-11-04 MED ORDER — POLYETHYLENE GLYCOL 3350 17 G PO PACK: 17.0000 g | PACK | Freq: Every day | ORAL | Status: DC | PRN

## 2016-11-04 MED ORDER — LIDOCAINE 2% (20 MG/ML) 5 ML SYRINGE
INTRAMUSCULAR | Status: DC | PRN
  Administered 2016-11-04: 100 mg via INTRAVENOUS

## 2016-11-04 MED ORDER — HYDROMORPHONE HCL 1 MG/ML IJ SOLN: 0.5000 mg | INTRAMUSCULAR | Status: DC | PRN

## 2016-11-04 MED ORDER — SODIUM CHLORIDE 0.9 % IJ SOLN
INTRAMUSCULAR | Status: DC | PRN
  Administered 2016-11-04: 30 mL

## 2016-11-04 MED ORDER — LEVOTHYROXINE SODIUM 75 MCG PO TABS
75.0000 ug | ORAL_TABLET | Freq: Every day | ORAL | Status: DC
  Administered 2016-11-05 – 2016-11-06 (×2): 75 ug via ORAL
  Filled 2016-11-04 (×2): qty 1

## 2016-11-04 MED ORDER — PROPOFOL 500 MG/50ML IV EMUL
INTRAVENOUS | Status: DC | PRN
  Administered 2016-11-04: 75 ug/kg/min via INTRAVENOUS

## 2016-11-04 MED ORDER — ACETAMINOPHEN 650 MG RE SUPP: 650.0000 mg | Freq: Four times a day (QID) | RECTAL | Status: DC | PRN

## 2016-11-04 MED ORDER — ONDANSETRON HCL 4 MG PO TABS: 4.0000 mg | ORAL_TABLET | Freq: Four times a day (QID) | ORAL | Status: DC | PRN

## 2016-11-04 MED ORDER — METHOCARBAMOL 500 MG PO TABS
500.0000 mg | ORAL_TABLET | Freq: Four times a day (QID) | ORAL | Status: DC | PRN
  Administered 2016-11-04 – 2016-11-06 (×6): 500 mg via ORAL
  Filled 2016-11-04 (×6): qty 1

## 2016-11-04 MED ORDER — FLUTICASONE PROPIONATE 50 MCG/ACT NA SUSP
1.0000 | Freq: Every day | NASAL | Status: DC
  Administered 2016-11-05 – 2016-11-06 (×2): 1 via NASAL
  Filled 2016-11-04: qty 16

## 2016-11-04 MED ORDER — BUPIVACAINE HCL (PF) 0.25 % IJ SOLN
INTRAMUSCULAR | Status: AC
  Filled 2016-11-04: qty 30

## 2016-11-04 MED ORDER — DEXAMETHASONE SODIUM PHOSPHATE 10 MG/ML IJ SOLN
10.0000 mg | Freq: Once | INTRAMUSCULAR | Status: AC
  Administered 2016-11-04: 10 mg via INTRAVENOUS

## 2016-11-04 MED ORDER — BISACODYL 10 MG RE SUPP: 10.0000 mg | Freq: Every day | RECTAL | Status: DC | PRN

## 2016-11-04 MED ORDER — ACETAMINOPHEN 325 MG PO TABS
650.0000 mg | ORAL_TABLET | Freq: Four times a day (QID) | ORAL | Status: DC | PRN
Start: 2016-11-05 — End: 2016-11-06
  Administered 2016-11-05 (×2): 650 mg via ORAL
  Filled 2016-11-04 (×3): qty 2

## 2016-11-04 MED ORDER — ACETAMINOPHEN 10 MG/ML IV SOLN
INTRAVENOUS | Status: AC
  Filled 2016-11-04: qty 100

## 2016-11-04 MED ORDER — FENTANYL CITRATE (PF) 100 MCG/2ML IJ SOLN
INTRAMUSCULAR | Status: AC
  Filled 2016-11-04: qty 2

## 2016-11-04 MED ORDER — CHLORHEXIDINE GLUCONATE 4 % EX LIQD
60.0000 mL | Freq: Once | CUTANEOUS | Status: DC
Start: 2016-11-04 — End: 2016-11-04

## 2016-11-04 MED ORDER — DEXAMETHASONE SODIUM PHOSPHATE 10 MG/ML IJ SOLN
INTRAMUSCULAR | Status: AC
  Filled 2016-11-04: qty 1

## 2016-11-04 MED ORDER — EPHEDRINE 5 MG/ML INJ
INTRAVENOUS | Status: AC
  Filled 2016-11-04: qty 10

## 2016-11-04 MED ORDER — BUPIVACAINE LIPOSOME 1.3 % IJ SUSP
20.0000 mL | Freq: Once | INTRAMUSCULAR | Status: DC
  Filled 2016-11-04: qty 20

## 2016-11-04 MED ORDER — ROPIVACAINE HCL 5 MG/ML IJ SOLN
INTRAMUSCULAR | Status: DC | PRN
  Administered 2016-11-04: 20 mL via PERINEURAL

## 2016-11-04 MED ORDER — ONDANSETRON HCL 4 MG/2ML IJ SOLN
INTRAMUSCULAR | Status: AC
  Filled 2016-11-04: qty 2

## 2016-11-04 MED ORDER — METOCLOPRAMIDE HCL 5 MG/ML IJ SOLN: 5.0000 mg | Freq: Three times a day (TID) | INTRAMUSCULAR | Status: DC | PRN

## 2016-11-04 MED ORDER — ONDANSETRON HCL 4 MG/2ML IJ SOLN
4.0000 mg | Freq: Once | INTRAMUSCULAR | Status: AC | PRN
  Administered 2016-11-04: 4 mg via INTRAVENOUS

## 2016-11-04 MED ORDER — DEXAMETHASONE SODIUM PHOSPHATE 10 MG/ML IJ SOLN
10.0000 mg | Freq: Once | INTRAMUSCULAR | Status: AC
  Administered 2016-11-05: 10 mg via INTRAVENOUS
  Filled 2016-11-04: qty 1

## 2016-11-04 MED ORDER — MIDAZOLAM HCL 2 MG/2ML IJ SOLN
INTRAMUSCULAR | Status: AC
  Filled 2016-11-04: qty 2

## 2016-11-04 MED ORDER — EPHEDRINE SULFATE-NACL 50-0.9 MG/10ML-% IV SOSY
PREFILLED_SYRINGE | INTRAVENOUS | Status: DC | PRN
  Administered 2016-11-04 (×5): 10 mg via INTRAVENOUS

## 2016-11-04 MED ORDER — BUPIVACAINE IN DEXTROSE 0.75-8.25 % IT SOLN
INTRATHECAL | Status: DC | PRN
  Administered 2016-11-04: 1.6 mL via INTRATHECAL

## 2016-11-04 MED ORDER — DOCUSATE SODIUM 100 MG PO CAPS
100.0000 mg | ORAL_CAPSULE | Freq: Two times a day (BID) | ORAL | Status: DC
  Administered 2016-11-04 – 2016-11-06 (×4): 100 mg via ORAL
  Filled 2016-11-04 (×4): qty 1

## 2016-11-04 MED ORDER — FLEET ENEMA 7-19 GM/118ML RE ENEM: 1.0000 | ENEMA | Freq: Once | RECTAL | Status: DC | PRN

## 2016-11-04 MED ORDER — RIVAROXABAN 10 MG PO TABS
10.0000 mg | ORAL_TABLET | Freq: Every day | ORAL | Status: DC
  Administered 2016-11-05 – 2016-11-06 (×2): 10 mg via ORAL
  Filled 2016-11-04 (×2): qty 1

## 2016-11-04 MED ORDER — METHOCARBAMOL 1000 MG/10ML IJ SOLN
500.0000 mg | Freq: Four times a day (QID) | INTRAMUSCULAR | Status: DC | PRN
  Administered 2016-11-04: 500 mg via INTRAVENOUS
  Filled 2016-11-04: qty 550
  Filled 2016-11-04: qty 5

## 2016-11-04 SURGICAL SUPPLY — 53 items
BAG DECANTER FOR FLEXI CONT (MISCELLANEOUS) ×3 IMPLANT
BAG ZIPLOCK 12X15 (MISCELLANEOUS) ×3 IMPLANT
BANDAGE ACE 6X5 VEL STRL LF (GAUZE/BANDAGES/DRESSINGS) ×3 IMPLANT
BANDAGE ELASTIC 6 VELCRO ST LF (GAUZE/BANDAGES/DRESSINGS) ×3 IMPLANT
BLADE SAG 18X100X1.27 (BLADE) ×3 IMPLANT
BLADE SAW SGTL 11.0X1.19X90.0M (BLADE) ×3 IMPLANT
BOWL SMART MIX CTS (DISPOSABLE) ×3 IMPLANT
CAP KNEE TOTAL 3 SIGMA ×3 IMPLANT
CEMENT HV SMART SET (Cement) ×6 IMPLANT
CLOSURE WOUND 1/2 X4 (GAUZE/BANDAGES/DRESSINGS) ×1
CUFF TOURN SGL QUICK 34 (TOURNIQUET CUFF) ×2
CUFF TRNQT CYL 34X4X40X1 (TOURNIQUET CUFF) ×1 IMPLANT
DECANTER SPIKE VIAL GLASS SM (MISCELLANEOUS) ×3 IMPLANT
DRAPE U-SHAPE 47X51 STRL (DRAPES) ×3 IMPLANT
DRSG ADAPTIC 3X8 NADH LF (GAUZE/BANDAGES/DRESSINGS) ×3 IMPLANT
DRSG PAD ABDOMINAL 8X10 ST (GAUZE/BANDAGES/DRESSINGS) ×3 IMPLANT
DURAPREP 26ML APPLICATOR (WOUND CARE) ×3 IMPLANT
ELECT REM PT RETURN 9FT ADLT (ELECTROSURGICAL) ×3
ELECTRODE REM PT RTRN 9FT ADLT (ELECTROSURGICAL) ×1 IMPLANT
EVACUATOR 1/8 PVC DRAIN (DRAIN) ×3 IMPLANT
GAUZE SPONGE 4X4 12PLY STRL (GAUZE/BANDAGES/DRESSINGS) ×3 IMPLANT
GLOVE BIO SURGEON STRL SZ7.5 (GLOVE) IMPLANT
GLOVE BIO SURGEON STRL SZ8 (GLOVE) IMPLANT
GLOVE BIOGEL PI IND STRL 6.5 (GLOVE) IMPLANT
GLOVE BIOGEL PI IND STRL 8 (GLOVE) ×4 IMPLANT
GLOVE BIOGEL PI INDICATOR 6.5 (GLOVE)
GLOVE BIOGEL PI INDICATOR 8 (GLOVE) ×8
GLOVE SURG SS PI 6.5 STRL IVOR (GLOVE) IMPLANT
GOWN STRL REUS W/TWL LRG LVL3 (GOWN DISPOSABLE) ×3 IMPLANT
GOWN STRL REUS W/TWL XL LVL3 (GOWN DISPOSABLE) IMPLANT
HANDPIECE INTERPULSE COAX TIP (DISPOSABLE) ×2
IMMOBILIZER KNEE 20 (SOFTGOODS) ×3
IMMOBILIZER KNEE 20 THIGH 36 (SOFTGOODS) ×1 IMPLANT
MANIFOLD NEPTUNE II (INSTRUMENTS) ×3 IMPLANT
NS IRRIG 1000ML POUR BTL (IV SOLUTION) ×3 IMPLANT
PACK TOTAL KNEE CUSTOM (KITS) ×3 IMPLANT
PAD ABD 8X10 STRL (GAUZE/BANDAGES/DRESSINGS) ×3 IMPLANT
PADDING CAST COTTON 6X4 STRL (CAST SUPPLIES) ×3 IMPLANT
POSITIONER SURGICAL ARM (MISCELLANEOUS) ×3 IMPLANT
SET HNDPC FAN SPRY TIP SCT (DISPOSABLE) ×1 IMPLANT
STRIP CLOSURE SKIN 1/2X4 (GAUZE/BANDAGES/DRESSINGS) ×2 IMPLANT
SUT MNCRL AB 4-0 PS2 18 (SUTURE) ×3 IMPLANT
SUT STRATAFIX 0 PDS 27 VIOLET (SUTURE) ×3
SUT VIC AB 2-0 CT1 27 (SUTURE) ×6
SUT VIC AB 2-0 CT1 TAPERPNT 27 (SUTURE) ×3 IMPLANT
SUT VLOC 180 0 24IN GS25 (SUTURE) IMPLANT
SUTURE STRATFX 0 PDS 27 VIOLET (SUTURE) ×1 IMPLANT
SYR 50ML LL SCALE MARK (SYRINGE) IMPLANT
TRAY FOLEY BAG SILVER LF 16FR (SET/KITS/TRAYS/PACK) ×3 IMPLANT
TRAY FOLEY W/METER SILVER 16FR (SET/KITS/TRAYS/PACK) IMPLANT
WATER STERILE IRR 1000ML POUR (IV SOLUTION) ×6 IMPLANT
WRAP KNEE MAXI GEL POST OP (GAUZE/BANDAGES/DRESSINGS) ×3 IMPLANT
YANKAUER SUCT BULB TIP 10FT TU (MISCELLANEOUS) ×3 IMPLANT

## 2016-11-04 NOTE — Transfer of Care (Signed)
Immediate Anesthesia Transfer of Care Note  Patient: [name] [name]  Procedure(s) Performed: Procedure(s) with comments: LEFT TOTAL KNEE ARTHROPLASTY (Left) - requests 60mins  Patient Location: PACU  Anesthesia Type:Regional  Level of Consciousness: awake, alert  and oriented  Airway & Oxygen Therapy: Patient Spontanous Breathing and Patient connected to face mask oxygen  Post-op Assessment: Report given to RN and Post -op Vital signs reviewed and stable  Post vital signs: Reviewed and stable  Last Vitals:  Vitals:   11/04/16 0750 11/04/16 0755  BP: (!) 105/56 (!) 108/54  Pulse: 65 63  Resp: 14 14  Temp:      Last Pain:  Vitals:   11/04/16 0601  TempSrc: Oral      Patients Stated Pain Goal: 4 (76/14/70 9295)  Complications: No apparent anesthesia complications

## 2016-11-04 NOTE — Progress Notes (Signed)
AssistedDr. Turk with left, ultrasound guided, adductor canal block. Side rails up, monitors on throughout procedure. See vital signs in flow sheet. Tolerated Procedure well.  

## 2016-11-04 NOTE — Anesthesia Procedure Notes (Signed)
Anesthesia Regional Block: Adductor canal block   Pre-Anesthetic Checklist: ,, timeout performed, Correct Patient, Correct Site, Correct Laterality, Correct Procedure, Correct Position, site marked, Risks and benefits discussed,  Surgical consent,  Pre-op evaluation,  At surgeon's request and post-op pain management  Laterality: Left  Prep: chloraprep       Needles:  Injection technique: Single-shot  Needle Type: Echogenic Needle     Needle Length: 9cm  Needle Gauge: 21     Additional Needles:   Procedures: ultrasound guided,,,,,,,,  Narrative:  Start time: 11/04/2016 7:40 AM End time: 11/04/2016 7:42 AM Injection made incrementally with aspirations every 5 mL.  Performed by: Personally  Anesthesiologist: Catalina Gravel  Additional Notes: No pain on injection. No increased resistance to injection. Injection made in 5cc increments.  Good needle visualization.  Patient tolerated procedure well.

## 2016-11-04 NOTE — Anesthesia Preprocedure Evaluation (Addendum)
Anesthesia Evaluation  Patient identified by MRN, date of birth, ID band Patient awake    Reviewed: Allergy & Precautions, NPO status , Patient's Chart, lab work & pertinent test results  History of Anesthesia Complications (+) Family history of anesthesia reaction and history of anesthetic complications ("trouble waking up"/ mother with supposed MH)  Airway Mallampati: I  TM Distance: >3 FB Neck ROM: Full    Dental  (+) Teeth Intact, Dental Advisory Given, Missing, Caps   Pulmonary shortness of breath, COPD,  COPD inhaler, former smoker, PE   Pulmonary exam normal breath sounds clear to auscultation       Cardiovascular + Peripheral Vascular Disease  Normal cardiovascular exam+ dysrhythmias (PVCs)  Rhythm:Regular Rate:Normal     Neuro/Psych negative neurological ROS     GI/Hepatic Neg liver ROS, GERD  Medicated,  Endo/Other  Hypothyroidism   Renal/GU negative Renal ROS     Musculoskeletal  (+) Arthritis , Osteoarthritis,    Abdominal   Peds  Hematology negative hematology ROS (+) Plt 209k   Anesthesia Other Findings Day of surgery medications reviewed with the patient.  Reproductive/Obstetrics                            Anesthesia Physical Anesthesia Plan  ASA: III  Anesthesia Plan: Spinal and MAC   Post-op Pain Management:  Regional for Post-op pain   Induction:   Airway Management Planned: Simple Face Mask  Additional Equipment:   Intra-op Plan:   Post-operative Plan:   Informed Consent: I have reviewed the patients History and Physical, chart, labs and discussed the procedure including the risks, benefits and alternatives for the proposed anesthesia with the patient or authorized representative who has indicated his/her understanding and acceptance.   Dental advisory given  Plan Discussed with: CRNA, Anesthesiologist and Surgeon  Anesthesia Plan Comments: (Discussed  risks and benefits of and differences between spinal and general. Discussed risks of spinal including headache, backache, failure, bleeding, infection, and nerve damage. Patient consents to spinal. Questions answered. Coagulation studies and platelet count acceptable.)        Anesthesia Quick Evaluation

## 2016-11-04 NOTE — Interval H&P Note (Signed)
History and Physical Interval Note:  11/04/2016 6:43 AM  [name] [name]  has presented today for surgery, with the diagnosis of Osteoarthritis Left Knee  The various methods of treatment have been discussed with the patient and family. After consideration of risks, benefits and other options for treatment, the patient has consented to  Procedure(s) with comments: LEFT TOTAL KNEE ARTHROPLASTY (Left) - requests 24mins as a surgical intervention .  The patient's history has been reviewed, patient examined, no change in status, stable for surgery.  I have reviewed the patient's chart and labs.  Questions were answered to the patient's satisfaction.     [name]

## 2016-11-04 NOTE — Anesthesia Procedure Notes (Signed)
Spinal  Patient location during procedure: OR End time: 11/04/2016 8:15 AM Staffing Resident/CRNA: Noralyn Pick D Performed: anesthesiologist and resident/CRNA  Preanesthetic Checklist Completed: patient identified, site marked, surgical consent, pre-op evaluation, timeout performed, IV checked, risks and benefits discussed and monitors and equipment checked Spinal Block Patient position: sitting Prep: Betadine Patient monitoring: heart rate, continuous pulse ox and blood pressure Approach: midline Location: L3-4 Injection technique: single-shot Needle Needle type: Sprotte and Spinocan  Needle gauge: 22 G Needle length: 9 cm Assessment Sensory level: T6 Additional Notes Expiration date of kit checked and confirmed. Patient tolerated procedure well, without complications.

## 2016-11-04 NOTE — Evaluation (Signed)
Physical Therapy Evaluation Patient Details Name: [name] [name] MRN: [id number] DOB: 10/04/1940 Today's Date: 11/04/2016   History of Present Illness  L TKA  Clinical Impression  The patient tolerated ambulating x 10'. C/O pain in back of knee. Encouraged to remain flat as possible. Pt admitted with above diagnosis. Pt currently with functional limitations due to the deficits listed below (see PT Problem List). Pt will benefit from skilled PT to increase their independence and safety with mobility to allow discharge to the venue listed below.       Follow Up Recommendations Home health PT;Supervision/Assistance - 24 hour    Equipment Recommendations  None recommended by PT    Recommendations for Other Services       Precautions / Restrictions Precautions Precautions: Knee Required Braces or Orthoses: Knee Immobilizer - Left Knee Immobilizer - Left: Discontinue once straight leg raise with < 10 degree lag      Mobility  Bed Mobility Overal bed mobility: Needs Assistance Bed Mobility: Supine to Sit;Sit to Supine     Supine to sit: Min assist Sit to supine: Min assist   General bed mobility comments: assist wiith left leg  Transfers Overall transfer level: Needs assistance Equipment used: Rolling walker (2 wheeled) Transfers: Sit to/from Stand Sit to Stand: Min assist;From elevated surface         General transfer comment: cues for hand and left leg position  Ambulation/Gait Ambulation/Gait assistance: Min assist   Assistive device: Rolling walker (2 wheeled) Gait Pattern/deviations: Step-to pattern;Decreased stance time - left;Decreased step length - left;Antalgic     General Gait Details: cues for sequqnce and posture  Stairs            Wheelchair Mobility    Modified Rankin (Stroke Patients Only)       Balance                                             Pertinent Vitals/Pain Pain Assessment: 0-10 Pain Score: 4  Pain  Location: rt knee Pain Descriptors / Indicators: Sore;Discomfort Pain Intervention(s): Patient requesting pain meds-RN notified;Ice applied;Repositioned;Premedicated before session;Monitored during session    [place] expects to be discharged to:: Private residence Living Arrangements: Spouse/significant other Available Help at Discharge: Family Type of Home: House Home Access: Stairs to enter Entrance Stairs-Rails: Psychiatric nurse of Steps: 3 Home Layout: One [place] - single point;Walker - 2 wheels;Crutches;Bedside commode      Prior Function Level of Independence: Independent               Hand Dominance        Extremity/Trunk Assessment   Upper Extremity Assessment Upper Extremity Assessment: Defer to OT evaluation    Lower Extremity Assessment Lower Extremity Assessment: LLE deficits/detail LLE Deficits / Details: able to lift leg from bed    Cervical / Trunk Assessment Cervical / Trunk Assessment: Normal  Communication   Communication: No difficulties  Cognition Arousal/Alertness: Awake/alert Behavior During Therapy: WFL for tasks assessed/performed Overall Cognitive Status: Within Functional Limits for tasks assessed                      General Comments      Exercises     Assessment/Plan    PT Assessment Patient needs continued PT services  PT Problem List Decreased strength;Decreased range of motion;Decreased activity  tolerance;Decreased mobility;Decreased knowledge of precautions;Decreased safety awareness;Decreased knowledge of use of DME       PT Treatment Interventions DME instruction;Gait training;Stair training;Functional mobility training;Therapeutic activities;Therapeutic exercise;Patient/family education    PT Goals (Current goals can be found in the Care Plan section)  Acute Rehab PT Goals Patient Stated Goal: to walk without pain PT Goal Formulation: With patient Time  For Goal Achievement: 11/08/16 Potential to Achieve Goals: Good    Frequency 7X/week   Barriers to discharge        Co-evaluation               End of Session Equipment Utilized During Treatment: Left knee immobilizer Activity Tolerance: Patient tolerated treatment well Patient left: in bed;with call bell/phone within reach Nurse Communication: Mobility status PT Visit Diagnosis: Muscle weakness (generalized) (M62.81);Difficulty in walking, not elsewhere classified (R26.2)         Time: 5277-8242 PT Time Calculation (min) (ACUTE ONLY): 17 min   Charges:   PT Evaluation $PT Eval Low Complexity: 1 Procedure     PT G CodesClaretha Cooper 11/04/2016, 6:10 PM Tresa Endo PT [phone]

## 2016-11-04 NOTE — Anesthesia Postprocedure Evaluation (Signed)
Anesthesia Post Note  Patient: [name] [name]  Procedure(s) Performed: Procedure(s) (LRB): LEFT TOTAL KNEE ARTHROPLASTY (Left)  Patient location during evaluation: PACU Anesthesia Type: MAC Level of consciousness: oriented and awake and alert Pain management: pain level controlled Vital Signs Assessment: post-procedure vital signs reviewed and stable Respiratory status: spontaneous breathing, respiratory function stable and patient connected to nasal cannula oxygen Cardiovascular status: blood pressure returned to baseline and stable Postop Assessment: no headache, no backache, spinal receding, no signs of nausea or vomiting and patient able to bend at knees Anesthetic complications: no       Last Vitals:  Vitals:   11/04/16 1320 11/04/16 1416  BP: (!) 125/57 (!) 115/51  Pulse: 79 78  Resp: 18 18  Temp: 36.7 C 36.7 C    Last Pain:  Vitals:   11/04/16 1416  TempSrc: Axillary  PainSc:                  Catalina Gravel

## 2016-11-04 NOTE — H&P (View-Only) (Signed)
[name] [name] DOB: 07-Sep-1940 Married / Language: English / Race: White Female Date of Admission:  11/04/2016 CC:  Left Knee Pain History of Present Illness  The patient is a 76 year old female who comes in for a preoperative History and Physical. The patient is scheduled for a left total knee arthroplasty to be performed by Dr. Dione Plover. Aluisio, MD at [facility] on 11-04-2016. The patient is a 76 year old female who presented with left knee complaints. The patient was seen in referral from [place]. The patient reports left knee symptoms including: pain, locking, stiffness and soreness which began 1 year(s) (and really bad in the past 3 months) ago without any known injury. The patient describes the severity of the symptoms as moderate in severity. The patient feels that the symptoms are worsening. The patient has the current diagnosis of knee osteoarthritis. Prior to being seen, the patient was previously evaluated by a primary care provider (She has seen Dr. Earnestine Leys with [facility] and was given a cortisone injection in October 2016.). Previous work-up for this problem has included knee x-rays. Symptoms are reported to be located in the left anterior knee and left lateral knee and include lateral knee pain, lateral knee tenderness, stiffness, difficulty bearing weight and difficulty ambulating. Current treatment includes application of ice and restricted activity. The knee is getting progressively worse over time. She said that it is limiting what she can and cannot do. She had her right knee replaced by Dr. Donnajean Lopes about 15 years ago and gets some discomfort with that. 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 Left knee is at a stage where it is affecting her mobility and she is now ready to proceed with surgery at this time. They have been treated conservatively in the past for the above stated problem and despite  conservative measures, they continue to have progressive pain and severe functional limitations and dysfunction. They have failed non-operative management including home exercise, medications, and injections. It is felt that they would benefit from undergoing total joint replacement. Risks and benefits of the procedure have been discussed with the patient and they elect to proceed with surgery. There are no active contraindications to surgery such as ongoing infection or rapidly progressive neurological disease.   Problem List/Past Medical  Chronic pain of left knee (M25.562)  Primary osteoarthritis of left knee (M17.12)  PVC (premature ventricular contraction) (I49.3)  Blood Clot  Chronic Obstructive Lung Disease  Hypercholesterolemia  Hypothyroidism  Osteoarthritis  Osteoporosis  Pulmonary Embolism - likely due to HRT in the past Pneumonia  Varicose veins  Urinary Tract Infection  Degenerative Disc Disease  Measles   Allergies Benadryl *ANTIHISTAMINES*  hypotension and tachycardia CeleBREX *ANALGESICS - ANTI-INFLAMMATORY*  bruising Vioxx *ANALGESICS - ANTI-INFLAMMATORY*  Rash. Codeine Sulfate *ANALGESICS - OPIOID*  Nausea, Vomiting. Morphine Sulfate *ANALGESICS - OPIOID*  Nausea, Vomiting.  Family History Cancer  Sister. Cerebrovascular Accident  Maternal Grandmother, Mother, Paternal Grandmother. Chronic Obstructive Lung Disease  Sister. Diabetes Mellitus  Mother. Heart Disease  Father, Paternal Grandfather. Hypertension  Father, Maternal Grandmother, Mother, Paternal Grandmother. Osteoarthritis  Sister. Osteoporosis  Mother, Sister.  Social History  Children  2 Current drinker  05/06/2016: Currently drinks wine and hard liquor less than 5 times per week Current work status  retired Furniture conservator/restorer daily; does running / walking Living situation  live with spouse Marital status  married No history of drug/alcohol rehab  Not under  pain contract  Number of flights of stairs before winded  2-3 Tobacco / smoke exposure  05/06/2016: no Tobacco use  Former smoker. 05/06/2016: smoke(d) 3 more pack(s) per day Advance Directives  Living Will, Healthcare POA  Medication History  Triamcinolone Acetonide (0.1% Cream, External) Active. (prn) Ventolin HFA (108 (90 Base)MCG/ACT Aerosol Soln, Inhalation) Active. Fexofenadine HCl (60MG  Tablet, Oral) Active. Nasocort Active. Aspirin (81MG  Tablet Chewable, Oral) Active. Meloxicam (7.5MG  Tablet, Oral) Active. Flecainide Acetate (50MG  Tablet, Oral) Active. Levothyroxine Sodium (75MCG Tablet, Oral) Active. Spiriva HandiHaler (18MCG Capsule, Inhalation) Active. Advair Diskus (250-50MCG/DOSE Aero Pow Br Act, Inhalation) Active.  Past Surgical History Appendectomy  Date: 1962. Dilation and Curettage of Uterus  Date: 1968. Right Medial Meniscal Surgery  Date: 52. Total Knee Replacement  Date: 2002. right Gallbladder Surgery  Date: 2005. laporoscopic Cataract Surgery  bilateral, 2015 and 2016   Review of Systems General Not Present- Chills, Fatigue, Fever, Memory Loss, Night Sweats, Weight Gain and Weight Loss. Skin Not Present- Eczema, Hives, Itching, Lesions and Rash. HEENT Not Present- Dentures, Double Vision, Headache, Hearing Loss, Tinnitus and Visual Loss. Respiratory Present- Cough and Shortness of breath with exertion. Not Present- Allergies, Chronic Cough, Coughing up blood and Shortness of breath at rest. Cardiovascular Not Present- Chest Pain, Difficulty Breathing Lying Down, Murmur, Palpitations, Racing/skipping heartbeats and Swelling. Gastrointestinal Present- Constipation, Difficulty Swallowing and Heartburn. Not Present- Abdominal Pain, Bloody Stool, Diarrhea, Jaundice, Loss of appetitie, Nausea and Vomiting. Female Genitourinary Present- Urinary frequency and Urinating at Night. Not Present- Blood in Urine, Discharge, Flank Pain, Incontinence,  Painful Urination, Urgency, Urinary Retention and Weak urinary stream. Musculoskeletal Present- Back Pain, Joint Pain and Joint Swelling. Not Present- Morning Stiffness, Muscle Pain, Muscle Weakness and Spasms. Neurological Not Present- Blackout spells, Difficulty with balance, Dizziness, Paralysis, Tremor and Weakness. Psychiatric Not Present- Insomnia.  Vitals Weight: 132 lb Height: 63in Weight was reported by patient. Height was reported by patient. Body Surface Area: 1.62 m Body Mass Index: 23.38 kg/m  Pulse: 68 (Regular)  Resp.: 14 (Unlabored)  BP: 132/72 (Sitting, Right Arm, Standard)   Physical Exam  General Mental Status -Alert, cooperative and good historian. General Appearance-pleasant, Not in acute distress. Orientation-Oriented X3. Build & Nutrition-Well nourished and Well developed.  Head and Neck Head-normocephalic, atraumatic . Neck Global Assessment - supple, no bruit auscultated on the right, no bruit auscultated on the left.  Eye Pupil - Bilateral-Regular and Round. Motion - Bilateral-EOMI.  Chest and Lung Exam Auscultation Breath sounds - clear at anterior chest wall and clear at posterior chest wall. Adventitious sounds - No Adventitious sounds.  Cardiovascular Auscultation Rhythm - Regular rate and rhythm. Heart Sounds - S1 WNL and S2 WNL. Murmurs & Other Heart Sounds - Auscultation of the heart reveals - No Murmurs.  Abdomen Palpation/Percussion Tenderness - Abdomen is non-tender to palpation. Rigidity (guarding) - Abdomen is soft. Auscultation Auscultation of the abdomen reveals - Bowel sounds normal.  Female Genitourinary Note: Not done, not pertinent to present illness   Musculoskeletal Note: Very pleasant, well-developed female, alert and oriented, in no apparent distress. Evaluation of her hips show normal range of motion with no discomfort. Right knee range is about 0 to 105. No tenderness or instability. Left  knee, varus deformity, range about 5 to 125, marked crepitus on range of motion with tenderness medial greater than lateral with no instability noted. Pulse, sensation, and motor are intact. Gait pattern is significantly antalgic on the left.  RADIOGRAPHS AP of both knees, lateral of  the left showed that she has got significant arthritic change throughout the left knee worse in the lateral and patellofemoral compartments.   Assessment & Plan Primary osteoarthritis of left knee (M17.12)  Note:Surgical Plans: Left Total Knee Replacement  Disposition: Home with family.  She wants to start with HHPT thru [place]  PCP: Dr. Miguel Aschoff Cards: Dr. Bartholome Bill  Topical TXA - History of PE  Anesthesia Issues: Slow to wake up in the past  Patient was instructed on what medications to stop prior to surgery.  Signed electronically by Ok Edwards, III PA-C

## 2016-11-04 NOTE — Op Note (Signed)
OPERATIVE REPORT-TOTAL KNEE ARTHROPLASTY   Pre-operative diagnosis- Osteoarthritis  Left knee(s)  Post-operative diagnosis- Osteoarthritis Left knee(s)  Procedure-  Left  Total Knee Arthroplasty  Surgeon- Dione Plover. Jerrid Forgette, MD  Assistant- Arlee Muslim, PA-C   Anesthesia-  Adductor canal block and spinal  EBL-* No blood loss amount entered *   Drains Hemovac  Tourniquet time-  Total Tourniquet Time Documented: Thigh (Left) - 29 minutes Total: Thigh (Left) - 29 minutes     Complications- None  Condition-PACU - hemodynamically stable.   Brief Clinical Note  [name] [name] is a 76 y.o. year old female with end stage OA of her left knee with progressively worsening pain and dysfunction. She has constant pain, with activity and at rest and significant functional deficits with difficulties even with ADLs. She has had extensive non-op management including analgesics, injections of cortisone and viscosupplements, and home exercise program, but remains in significant pain with significant dysfunction. Radiographs show bone on bone arthritis lateral and patellofemoral. She presents now for left Total Knee Arthroplasty.    Procedure in detail---   The patient is brought into the operating room and positioned supine on the operating table. After successful administration of  Adductor canal block and spinal,   a tourniquet is placed high on the  Left thigh(s) and the lower extremity is prepped and draped in the usual sterile fashion. Time out is performed by the operating team and then the  Left lower extremity is wrapped in Esmarch, knee flexed and the tourniquet inflated to 300 mmHg.       A midline incision is made with a ten blade through the subcutaneous tissue to the level of the extensor mechanism. A fresh blade is used to make a medial parapatellar arthrotomy. Soft tissue over the proximal medial tibia is subperiosteally elevated to the joint line with a knife and into the  semimembranosus bursa with a Cobb elevator. Soft tissue over the proximal lateral tibia is elevated with attention being paid to avoiding the patellar tendon on the tibial tubercle. The patella is everted, knee flexed 90 degrees and the ACL and PCL are removed. Findings are bone on bone lateral and patellofemoral with large lateral osteophytes.        The drill is used to create a starting hole in the distal femur and the canal is thoroughly irrigated with sterile saline to remove the fatty contents. The 5 degree Left  valgus alignment guide is placed into the femoral canal and the distal femoral cutting block is pinned to remove 10 mm off the distal femur. Resection is made with an oscillating saw.      The tibia is subluxed forward and the menisci are removed. The extramedullary alignment guide is placed referencing proximally at the medial aspect of the tibial tubercle and distally along the second metatarsal axis and tibial crest. The block is pinned to remove 52mm off the more deficient lateral  side. Resection is made with an oscillating saw. Size 2.5is the most appropriate size for the tibia and the proximal tibia is prepared with the modular drill and keel punch for that size.      The femoral sizing guide is placed and size 3 is most appropriate. Rotation is marked off the epicondylar axis and confirmed by creating a rectangular flexion gap at 90 degrees. The size 3 cutting block is pinned in this rotation and the anterior, posterior and chamfer cuts are made with the oscillating saw. The intercondylar block is then placed and  that cut is made.      Trial size 2.5 tibial component, trial size 3 posterior stabilized femur and a 10  mm posterior stabilized rotating platform insert trial is placed. Full extension is achieved with excellent varus/valgus and anterior/posterior balance throughout full range of motion. The patella is everted and thickness measured to be 22  mm. Free hand resection is taken to  12 mm, a 35 template is placed, lug holes are drilled, trial patella is placed, and it tracks normally. Osteophytes are removed off the posterior femur with the trial in place. All trials are removed and the cut bone surfaces prepared with pulsatile lavage. Cement is mixed and once ready for implantation, the size 2.5 tibial implant, size  3 posterior stabilized femoral component, and the size 35 patella are cemented in place and the patella is held with the clamp. The trial insert is placed and the knee held in full extension. The Exparel (20 ml mixed with 30 ml saline) and .25% Bupivicaine, are injected into the extensor mechanism, posterior capsule, medial and lateral gutters and subcutaneous tissues.  All extruded cement is removed and once the cement is hard the permanent 10 mm posterior stabilized rotating platform insert is placed into the tibial tray.      The wound is copiously irrigated with saline solution and the extensor mechanism closed over a hemovac drain with #1 V-loc suture. The tourniquet is released for a total tourniquet time of 29  minutes. Flexion against gravity is 140 degrees and the patella tracks normally. Subcutaneous tissue is closed with 2.0 vicryl and subcuticular with running 4.0 Monocryl. The incision is cleaned and dried and steri-strips and a bulky sterile dressing are applied. The limb is placed into a knee immobilizer and the patient is awakened and transported to recovery in stable condition.      Please note that a surgical assistant was a medical necessity for this procedure in order to perform it in a safe and expeditious manner. Surgical assistant was necessary to retract the ligaments and vital neurovascular structures to prevent injury to them and also necessary for proper positioning of the limb to allow for anatomic placement of the prosthesis.   Dione Plover Lailee Hoelzel, MD    11/04/2016, 9:10 AM

## 2016-11-05 ENCOUNTER — Encounter (HOSPITAL_COMMUNITY): Payer: Self-pay | Admitting: Orthopedic Surgery

## 2016-11-05 LAB — BASIC METABOLIC PANEL
Anion gap: 5 (ref 5–15)
BUN: 9 mg/dL (ref 6–20)
CO2: 25 mmol/L (ref 22–32)
Calcium: 8.8 mg/dL — ABNORMAL LOW (ref 8.9–10.3)
Chloride: 109 mmol/L (ref 101–111)
Creatinine, Ser: 0.58 mg/dL (ref 0.44–1.00)
GFR calc Af Amer: >60 mL/min (ref >60)
Glucose, Bld: 105 mg/dL — ABNORMAL HIGH (ref 65–99)
POTASSIUM: 4.4 mmol/L (ref 3.5–5.1)
Sodium: 139 mmol/L (ref 135–145)

## 2016-11-05 LAB — CBC
HCT: 37.3 % (ref 36.0–46.0)
Hemoglobin: 12.7 g/dL (ref 12.0–15.0)
MCH: 32.7 pg (ref 26.0–34.0)
MCHC: 34 g/dL (ref 30.0–36.0)
MCV: 96.1 fL (ref 78.0–100.0)
PLATELETS: 187 10*3/uL (ref 150–400)
RBC: 3.88 MIL/uL (ref 3.87–5.11)
RDW: 12.9 % (ref 11.5–15.5)
WBC: 10.5 10*3/uL (ref 4.0–10.5)

## 2016-11-05 MED ORDER — HYDROMORPHONE HCL 2 MG PO TABS: 2.0000 mg | ORAL_TABLET | ORAL | 0 refills | Status: DC | PRN

## 2016-11-05 MED ORDER — RIVAROXABAN 10 MG PO TABS: 10.0000 mg | ORAL_TABLET | Freq: Every day | ORAL | 0 refills | Status: DC

## 2016-11-05 MED ORDER — METHOCARBAMOL 500 MG PO TABS: 500.0000 mg | ORAL_TABLET | Freq: Four times a day (QID) | ORAL | 0 refills | Status: DC | PRN

## 2016-11-05 MED ORDER — GABAPENTIN 300 MG PO CAPS: 300.0000 mg | ORAL_CAPSULE | Freq: Three times a day (TID) | ORAL | 0 refills | Status: DC

## 2016-11-05 NOTE — Discharge Summary (Signed)
Physician Discharge Summary   Patient ID: [name] MRN: [id number] DOB/AGE: 76-Oct-1942 76 y.o.  Admit date: 11/04/2016 Discharge date: 11/06/16  Primary Diagnosis:  Osteoarthritis  Left knee(s)  Admission Diagnoses:  Past Medical History:  Diagnosis Date  . Allergy   . Allergy    allergy to spandex "had a bad reaction of blisters when using after a surgery"  . 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 Complication of anesthesia    reports history of "trouble waking up after surgery"  . COPD (chronic obstructive pulmonary disease) ([place])   . Dysrhythmia    frequent PVC's  . Family history of adverse reaction to anesthesia    mother with history of malignant hypertension  . GERD (gastroesophageal reflux disease)   . Hyperlipidemia   . Hypothyroidism   . Peripheral vascular disease ([place]) 1980   pulmonary embolism  . Pneumonia   . PVC (premature ventricular contraction)   . Shortness of breath dyspnea    with extertion  . Thyroid disease    Discharge Diagnoses:   Principal Problem:   OA (osteoarthritis) of knee  Estimated body mass index is 23.56 kg/m as calculated from the following:   Height as of this encounter: _0  (1.6 m).   Weight as of this encounter: 60.3 kg (133 lb).  Procedure:  Procedure(s) (LRB): LEFT TOTAL KNEE ARTHROPLASTY (Left)   Consults: None  HPI: [name] [name] is a 76 y.o. year old female with end stage OA of her left knee with progressively worsening pain and dysfunction. She has constant pain, with activity and at rest and significant functional deficits with difficulties even with ADLs. She has had extensive non-op management including analgesics, injections of cortisone and viscosupplements, and home exercise program, but remains in significant pain with significant dysfunction. Radiographs show bone on bone arthritis lateral and patellofemoral. She presents now for left Total Knee Arthroplasty. Laboratory  Data: Admission on 11/04/2016  Component Date Value Ref Range Status  . WBC 11/05/2016 10.5  4.0 - 10.5 K/uL Final  . RBC 11/05/2016 3.88  3.87 - 5.11 MIL/uL Final  . Hemoglobin 11/05/2016 12.7  12.0 - 15.0 g/dL Final  . HCT 11/05/2016 37.3  36.0 - 46.0 % Final  . MCV 11/05/2016 96.1  78.0 - 100.0 fL Final  . MCH 11/05/2016 32.7  26.0 - 34.0 pg Final  . MCHC 11/05/2016 34.0  30.0 - 36.0 g/dL Final  . RDW 11/05/2016 12.9  11.5 - 15.5 % Final  . Platelets 11/05/2016 187  150 - 400 K/uL Final  . Sodium 11/05/2016 139  135 - 145 mmol/L Final  . Potassium 11/05/2016 4.4  3.5 - 5.1 mmol/L Final  . Chloride 11/05/2016 109  101 - 111 mmol/L Final  . CO2 11/05/2016 25  22 - 32 mmol/L Final  . Glucose, Bld 11/05/2016 105* 65 - 99 mg/dL Final  . BUN 11/05/2016 9  6 - 20 mg/dL Final  . Creatinine, Ser 11/05/2016 0.58  0.44 - 1.00 mg/dL Final  . Calcium 11/05/2016 8.8* 8.9 - 10.3 mg/dL Final  . GFR calc non Af Amer 11/05/2016 >60  >60 mL/min Final  . GFR calc Af Amer 11/05/2016 >60  >60 mL/min Final   Comment: (NOTE) The eGFR has been calculated using the CKD EPI equation. This calculation has not been validated in all clinical situations. eGFR's persistently <60 mL/min signify possible Chronic Kidney Disease.   . Anion gap 11/05/2016 5  5 -  70 Final  Hospital Outpatient Visit on 10/30/2016  Component Date Value Ref Range Status  . aPTT 10/30/2016 27  24 - 36 seconds Final  . WBC 10/30/2016 5.0  4.0 - 10.5 K/uL Final  . RBC 10/30/2016 4.59  3.87 - 5.11 MIL/uL Final  . Hemoglobin 10/30/2016 15.1* 12.0 - 15.0 g/dL Final  . HCT 10/30/2016 43.8  36.0 - 46.0 % Final  . MCV 10/30/2016 95.4  78.0 - 100.0 fL Final  . MCH 10/30/2016 32.9  26.0 - 34.0 pg Final  . MCHC 10/30/2016 34.5  30.0 - 36.0 g/dL Final  . RDW 10/30/2016 12.7  11.5 - 15.5 % Final  . Platelets 10/30/2016 209  150 - 400 K/uL Final  . Sodium 10/30/2016 138  135 - 145 mmol/L Final  . Potassium 10/30/2016 4.2  3.5 - 5.1 mmol/L  Final  . Chloride 10/30/2016 103  101 - 111 mmol/L Final  . CO2 10/30/2016 31  22 - 32 mmol/L Final  . Glucose, Bld 10/30/2016 99  65 - 99 mg/dL Final  . BUN 10/30/2016 13  6 - 20 mg/dL Final  . Creatinine, Ser 10/30/2016 0.63  0.44 - 1.00 mg/dL Final  . Calcium 10/30/2016 9.3  8.9 - 10.3 mg/dL Final  . Total Protein 10/30/2016 7.0  6.5 - 8.1 g/dL Final  . Albumin 10/30/2016 4.0  3.5 - 5.0 g/dL Final  . AST 10/30/2016 25  15 - 41 U/L Final  . ALT 10/30/2016 24  14 - 54 U/L Final  . Alkaline Phosphatase 10/30/2016 51  38 - 126 U/L Final  . Total Bilirubin 10/30/2016 1.1  0.3 - 1.2 mg/dL Final  . GFR calc non Af Amer 10/30/2016 >60  >60 mL/min Final  . GFR calc Af Amer 10/30/2016 >60  >60 mL/min Final   Comment: (NOTE) The eGFR has been calculated using the CKD EPI equation. This calculation has not been validated in all clinical situations. eGFR's persistently <60 mL/min signify possible Chronic Kidney Disease.   . Anion gap 10/30/2016 4* 5 - 15 Final  . Prothrombin Time 10/30/2016 12.6  11.4 - 15.2 seconds Final  . INR 10/30/2016 0.94   Final  . ABO/RH(D) 10/30/2016 A NEG   Final  . Antibody Screen 10/30/2016 NEG   Final  . Sample Expiration 10/30/2016 11/07/2016   Final  . Extend sample reason 10/30/2016 NO TRANSFUSIONS OR PREGNANCY IN THE PAST 3 MONTHS   Final  . MRSA, PCR 10/30/2016 NEGATIVE  NEGATIVE Final  . Staphylococcus aureus 10/30/2016 NEGATIVE  NEGATIVE Final   Comment:        The Xpert SA Assay (FDA approved for NASAL specimens in patients over 16 years of age), is one component of a comprehensive surveillance program.  Test performance has been validated by [facility] for patients greater than or equal to 38 year old. It is not intended to diagnose infection nor to guide or monitor treatment.   . ABO/RH(D) 10/30/2016 A NEG   Final     X-Rays:No results found.  EKG: Orders placed or performed in visit on 06/28/15  . EKG 12-Lead     Hospital Course:  [name] [name] is a 76 y.o. who was admitted to [facility]. They were brought to the operating room on 11/04/2016 and underwent Procedure(s): LEFT TOTAL KNEE ARTHROPLASTY.  Patient tolerated the procedure well and was later transferred to the recovery room and then to the orthopaedic floor for postoperative care.  They were given PO and IV  analgesics for pain control following their surgery.  They were given 24 hours of postoperative antibiotics of  Anti-infectives    Start     Dose/Rate Route Frequency Ordered Stop   11/04/16 1400  ceFAZolin (ANCEF) IVPB 2g/100 mL premix     2 g 200 mL/hr over 30 Minutes Intravenous Every 6 hours 11/04/16 1125 11/04/16 2038   11/04/16 0555  ceFAZolin (ANCEF) IVPB 2g/100 mL premix     2 g 200 mL/hr over 30 Minutes Intravenous On call to O.R. 11/04/16 [id number] 11/04/16 0817     and started on DVT prophylaxis in the form of Xarelto.   PT and OT were ordered for total joint protocol.  Discharge planning consulted to help with postop disposition and equipment needs.  Patient had a good night on the evening of surgery.  They started to get up OOB with therapy on day one. Hemovac drain was pulled without difficulty.  Continued to work with therapy into day two.  Dressing was changed on day two and the incision was healing well.   Patient was seen in rounds and was ready to go home on POD 2.  Discharge home with home health Diet - Cardiac diet Follow up - in 2 weeks Activity - WBAT Disposition - Home Condition Upon Discharge - Good D/C Meds - See DC Summary DVT Prophylaxis - Xarelto    Discharge Instructions    Call MD / Call 911    Complete by:  As directed    If you experience chest pain or shortness of breath, CALL 911 and be transported to the hospital emergency room.  If you develope a fever above 101 F, pus (white drainage) or increased drainage or redness at the wound, or calf pain, call your surgeon's office.   Change dressing    Complete by:  As  directed    Change dressing daily with sterile 4 x 4 inch gauze dressing and apply TED hose. Do not submerge the incision under water.   Constipation Prevention    Complete by:  As directed    Drink plenty of fluids.  Prune juice may be helpful.  You may use a stool softener, such as Colace (over the counter) 100 mg twice a day.  Use MiraLax (over the counter) for constipation as needed.   Diet - low sodium heart healthy    Complete by:  As directed    Discharge instructions    Complete by:  As directed    Pick up stool softner and laxative for home use following surgery while on pain medications. Do not submerge incision under water. Please use good hand washing techniques while changing dressing each day. May shower starting three days after surgery. Please use a clean towel to pat the incision dry following showers. Continue to use ice for pain and swelling after surgery. Do not use any lotions or creams on the incision until instructed by your surgeon.  Wear both TED hose on both legs during the day every day for three weeks, but may have off at night at home.  Postoperative Constipation Protocol  Constipation - defined medically as fewer than three stools per week and severe constipation as less than one stool per week.  One of the most common issues patients have following surgery is constipation.  Even if you have a regular bowel pattern at home, your normal regimen is likely to be disrupted due to multiple reasons following surgery.  Combination of anesthesia, postoperative narcotics, change in appetite  and fluid intake all can affect your bowels.  In order to avoid complications following surgery, here are some recommendations in order to help you during your recovery period.  Colace (docusate) - Pick up an over-the-counter form of Colace or another stool softener and take twice a day as long as you are requiring postoperative pain medications.  Take with a full glass of water  daily.  If you experience loose stools or diarrhea, hold the colace until you stool forms back up.  If your symptoms do not get better within 1 week or if they get worse, check with your doctor.  Dulcolax (bisacodyl) - Pick up over-the-counter and take as directed by the product packaging as needed to assist with the movement of your bowels.  Take with a full glass of water.  Use this product as needed if not relieved by Colace only.   MiraLax (polyethylene glycol) - Pick up over-the-counter to have on hand.  MiraLax is a solution that will increase the amount of water in your bowels to assist with bowel movements.  Take as directed and can mix with a glass of water, juice, soda, coffee, or tea.  Take if you go more than two days without a movement. Do not use MiraLax more than once per day. Call your doctor if you are still constipated or irregular after using this medication for 7 days in a row.  If you continue to have problems with postoperative constipation, please contact the office for further assistance and recommendations.  If you experience "the worst abdominal pain ever" or develop nausea or vomiting, please contact the office immediatly for further recommendations for treatment.   Take Xarelto for two and a half more weeks, then discontinue Xarelto. Once the patient has completed the Xarelto, they may resume the 81 mg Aspirin.   Do not put a pillow under the knee. Place it under the heel.    Complete by:  As directed    Do not sit on low chairs, stoools or toilet seats, as it may be difficult to get up from low surfaces    Complete by:  As directed    Driving restrictions    Complete by:  As directed    No driving until released by the physician.   Increase activity slowly as tolerated    Complete by:  As directed    Lifting restrictions    Complete by:  As directed    No lifting until released by the physician.   Patient may shower    Complete by:  As directed    You may shower  without a dressing once there is no drainage.  Do not wash over the wound.  If drainage remains, do not shower until drainage stops.   TED hose    Complete by:  As directed    Use stockings (TED hose) for 3 weeks on both leg(s).  You may remove them at night for sleeping.   Weight bearing as tolerated    Complete by:  As directed    Laterality:  left   Extremity:  Lower     Allergies as of 11/05/2016      Reactions   Latex Itching   Codeine    Diphenhydramine    Decreased BP and pulse rate   Montelukast Sodium    Insomnia   Morphine Sulfate    Nsaids    Can take Meloxicam, Naproxen   Other Other (See Comments)   Opiates-nausea/vomiting Spandex-itching/rash/blisters Latex-itching  Tramadol    Mental status change   Celecoxib Rash   Epigastric pain   Rofecoxib Rash   Epigastric pain (vioxx)      Medication List    STOP taking these medications   aspirin 81 MG chewable tablet   calcium carbonate 500 MG chewable tablet Commonly known as:  TUMS - dosed in mg elemental calcium   meloxicam 7.5 MG tablet Commonly known as:  MOBIC   multivitamin with minerals Tabs tablet     TAKE these medications   acetaminophen 500 MG tablet Commonly known as:  TYLENOL Take 500 mg by mouth at bedtime.   ADVAIR DISKUS 250-50 MCG/DOSE Aepb Generic drug:  Fluticasone-Salmeterol Inhale 1 puff into the lungs 2 (two) times daily.   albuterol 108 (90 Base) MCG/ACT inhaler Commonly known as:  VENTOLIN HFA Inhale 1-2 puffs into the lungs every 4 (four) hours as needed for wheezing or shortness of breath. Reported on 12/04/2015   benzonatate 100 MG capsule Commonly known as:  TESSALON Take 1 capsule (100 mg total) by mouth daily.   fexofenadine 180 MG tablet Commonly known as:  ALLEGRA Take 1 tablet by mouth daily as needed for allergies.   flecainide 50 MG tablet Commonly known as:  TAMBOCOR Take 50 mg by mouth 2 (two) times daily.   gabapentin 300 MG capsule Commonly known as:   NEURONTIN Take 1 capsule (300 mg total) by mouth 3 (three) times daily. Gabapentin 300 mg Protocol Take a 300 mg capsule three times a day for one week, Then a 300 mg capsule twice a day for one week, Then a 300 mg capsule once a day for one week, then discontinue the Gabapentin.   HYDROmorphone 2 MG tablet Commonly known as:  DILAUDID Take 1-2 tablets (2-4 mg total) by mouth every 4 (four) hours as needed for moderate pain or severe pain.   levothyroxine 75 MCG tablet Commonly known as:  SYNTHROID, LEVOTHROID Take 1 tablet (75 mcg total) by mouth daily.   methocarbamol 500 MG tablet Commonly known as:  ROBAXIN Take 1 tablet (500 mg total) by mouth every 6 (six) hours as needed for muscle spasms.   NASACORT AQ NA Place 2 sprays into both nostrils daily.   polyethylene glycol packet Commonly known as:  MIRALAX / GLYCOLAX Take 17 g by mouth every other day as needed (for constipation).   rivaroxaban 10 MG Tabs tablet Commonly known as:  XARELTO Take 1 tablet (10 mg total) by mouth daily with breakfast. Take Xarelto for two and a half more weeks following discharge from the hospital, then discontinue Xarelto. Once the patient has completed the Xarelto, they may resume the 81 mg Aspirin. Start taking on:  11/06/2016   SYSTANE OP Apply 1-2 drops to eye 3 (three) times daily as needed (for dry eyes).   tiotropium 18 MCG inhalation capsule Commonly known as:  SPIRIVA HANDIHALER Place 1 capsule (18 mcg total) into inhaler and inhale daily.      Follow-up Information    [facility] Follow up.   Specialty:  [place] Why:  [name] Side has been requested as your home health physical therapist Contact information: [address] [place] [address] [phone]        [name], MD. Schedule an appointment as soon as possible for a visit on 11/19/2016.   Specialty:  Orthopedic Surgery Contact information: [address] [place]  [address] [phone]  Signed: Arlee Muslim, PA-C Orthopaedic Surgery 11/05/2016, 8:44 PM

## 2016-11-05 NOTE — Progress Notes (Signed)
Physical Therapy Treatment Patient Details Name: [name] [name] MRN: [id number] DOB: [birth year] Today's Date: 11/05/2016    History of Present Illness L TKA    PT Comments    POD # 1 pm session Assisted with amb in hallway then back to bed for CPM.  Pt progressing well and plans to D/C to home tomorrow.   Follow Up Recommendations  Home health PT;Supervision/Assistance - 24 hour     Equipment Recommendations  None recommended by PT    Recommendations for Other Services       Precautions / Restrictions Precautions Precautions: Fall;Knee Precaution Comments: instructed on KI use for amb and stairs Required Braces or Orthoses: Knee Immobilizer - Left Knee Immobilizer - Left: Discontinue once straight leg raise with < 10 degree lag Restrictions Weight Bearing Restrictions: No Other Position/Activity Restrictions: WBAT    Mobility  Bed Mobility Overal bed mobility: Needs Assistance Bed Mobility: Sit to Supine     Supine to sit: Min assist Sit to supine: Min assist   General bed mobility comments: assisted back to bed for CPM  Transfers Overall transfer level: Needs assistance Equipment used: Rolling walker (2 wheeled) Transfers: Sit to/from Stand Sit to Stand: Min guard         General transfer comment: cues for UE/LE placement  Ambulation/Gait Ambulation/Gait assistance: Min guard;Min assist Ambulation Distance (Feet): 40 Feet Assistive device: Rolling walker (2 wheeled) Gait Pattern/deviations: Step-to pattern;Decreased stance time - left;Decreased step length - left;Antalgic Gait velocity: decreased   General Gait Details: cues for sequqnce and posture   Stairs            Wheelchair Mobility    Modified Rankin (Stroke Patients Only)       Balance                                    Cognition Arousal/Alertness: Awake/alert Behavior During Therapy: WFL for tasks assessed/performed Overall Cognitive Status: Within  Functional Limits for tasks assessed                      Exercises      General Comments        Pertinent Vitals/Pain Pain Assessment: 0-10 Pain Score: 3  Pain Location: R knee Pain Descriptors / Indicators: Discomfort;Guarding;Operative site guarding Pain Intervention(s): Monitored during session;Repositioned;Ice applied    Home Living Family/patient expects to be discharged to:: Private residence Living Arrangements: Spouse/significant other Available Help at Discharge: Family         Home Equipment: Kasandra Knudsen - single point;Walker - 2 wheels;Crutches;Bedside commode Additional Comments: bathroom with walk in shower is small/tight. Daughter who is a PT will be coming tomorrow    Prior Function            PT Goals (current goals can now be found in the care plan section) Acute Rehab PT Goals Patient Stated Goal: to walk without pain Progress towards PT goals: Progressing toward goals    Frequency    7X/week      PT Plan Current plan remains appropriate    Co-evaluation             End of Session Equipment Utilized During Treatment: Left knee immobilizer;Gait belt Activity Tolerance: Patient tolerated treatment well Patient left: with call bell/phone within reach;in chair;with chair alarm set Nurse Communication: Mobility status PT Visit Diagnosis: Muscle weakness (generalized) (M62.81);Difficulty in walking, not elsewhere classified (R26.2)  Time: 2761-4709 PT Time Calculation (min) (ACUTE ONLY): 27 min  Charges:  $Gait Training: 8-22 mins $Therapeutic Activity: 8-22 mins                    G Codes:       Rica Koyanagi  PTA [facility]  Acute  Rehab Pager      [phone]

## 2016-11-05 NOTE — Discharge Instructions (Addendum)
° °Dr. Frank Aluisio °Total Joint Specialist °[facility] °[address] °[place], [place] [address] °[phone] ° °TOTAL KNEE REPLACEMENT POSTOPERATIVE DIRECTIONS ° °Knee Rehabilitation, Guidelines Following Surgery  °Results after knee surgery are often greatly improved when you follow the exercise, range of motion and muscle strengthening exercises prescribed by your doctor. Safety measures are also important to protect the knee from further injury. Any time any of these exercises cause you to have increased pain or swelling in your knee joint, decrease the amount until you are comfortable again and slowly increase them. If you have problems or questions, call your caregiver or physical therapist for advice.  ° °HOME CARE INSTRUCTIONS  °Remove items at home which could result in a fall. This includes throw rugs or furniture in walking pathways.  °· ICE to the affected knee every three hours for 30 minutes at a time and then as needed for pain and swelling.  Continue to use ice on the knee for pain and swelling from surgery. You may notice swelling that will progress down to the foot and ankle.  This is normal after surgery.  Elevate the leg when you are not up walking on it.   °· Continue to use the breathing machine which will help keep your temperature down.  It is common for your temperature to cycle up and down following surgery, especially at night when you are not up moving around and exerting yourself.  The breathing machine keeps your lungs expanded and your temperature down. °· Do not place pillow under knee, focus on keeping the knee straight while resting ° °DIET °You may resume your previous home diet once your are discharged from the hospital. ° °DRESSING / WOUND CARE / SHOWERING °You may shower 3 days after surgery, but keep the wounds dry during showering.  You may use an occlusive plastic wrap (Press'n Seal for example), NO SOAKING/SUBMERGING IN THE BATHTUB.  If the  bandage gets wet, change with a clean dry gauze.  If the incision gets wet, pat the wound dry with a clean towel. °You may start showering once you are discharged home but do not submerge the incision under water. Just pat the incision dry and apply a dry gauze dressing on daily. °Change the surgical dressing daily and reapply a dry dressing each time. ° °ACTIVITY °Walk with your walker as instructed. °Use walker as long as suggested by your caregivers. °Avoid periods of inactivity such as sitting longer than an hour when not asleep. This helps prevent blood clots.  °You may resume a sexual relationship in one month or when given the OK by your doctor.  °You may return to work once you are cleared by your doctor.  °Do not drive a car for 6 weeks or until released by you surgeon.  °Do not drive while taking narcotics. ° °WEIGHT BEARING °Weight bearing as tolerated with assist device (walker, cane, etc) as directed, use it as long as suggested by your surgeon or therapist, typically at least 4-6 weeks. ° °POSTOPERATIVE CONSTIPATION PROTOCOL °Constipation - defined medically as fewer than three stools per week and severe constipation as less than one stool per week. ° °One of the most common issues patients have following surgery is constipation.  Even if you have a regular bowel pattern at home, your normal regimen is likely to be disrupted due to multiple reasons following surgery.  Combination of anesthesia, postoperative narcotics, change in appetite and fluid intake all can affect your bowels.    In order to avoid complications following surgery, here are some recommendations in order to help you during your recovery period. ° °Colace (docusate) - Pick up an over-the-counter form of Colace or another stool softener and take twice a day as long as you are requiring postoperative pain medications.  Take with a full glass of water daily.  If you experience loose stools or diarrhea, hold the colace until you stool forms  back up.  If your symptoms do not get better within 1 week or if they get worse, check with your doctor. ° °Dulcolax (bisacodyl) - Pick up over-the-counter and take as directed by the product packaging as needed to assist with the movement of your bowels.  Take with a full glass of water.  Use this product as needed if not relieved by Colace only.  ° °MiraLax (polyethylene glycol) - Pick up over-the-counter to have on hand.  MiraLax is a solution that will increase the amount of water in your bowels to assist with bowel movements.  Take as directed and can mix with a glass of water, juice, soda, coffee, or tea.  Take if you go more than two days without a movement. °Do not use MiraLax more than once per day. Call your doctor if you are still constipated or irregular after using this medication for 7 days in a row. ° °If you continue to have problems with postoperative constipation, please contact the office for further assistance and recommendations.  If you experience "the worst abdominal pain ever" or develop nausea or vomiting, please contact the office immediatly for further recommendations for treatment. ° °ITCHING ° If you experience itching with your medications, try taking only a single pain pill, or even half a pain pill at a time.  You can also use Benadryl over the counter for itching or also to help with sleep.  ° °TED HOSE STOCKINGS °Wear the elastic stockings on both legs for three weeks following surgery during the day but you may remove then at night for sleeping. ° °MEDICATIONS °See your medication summary on the “After Visit Summary” that the nursing staff will review with you prior to discharge.  You may have some home medications which will be placed on hold until you complete the course of blood thinner medication.  It is important for you to complete the blood thinner medication as prescribed by your surgeon.  Continue your approved medications as instructed at time of  discharge. ° °PRECAUTIONS °If you experience chest pain or shortness of breath - call 911 immediately for transfer to the hospital emergency department.  °If you develop a fever greater that 101 F, purulent drainage from wound, increased redness or drainage from wound, foul odor from the wound/dressing, or calf pain - CONTACT YOUR SURGEON.   °                                                °FOLLOW-UP APPOINTMENTS °Make sure you keep all of your appointments after your operation with your surgeon and caregivers. You should call the office at the above phone number and make an appointment for approximately two weeks after the date of your surgery or on the date instructed by your surgeon outlined in the "After Visit Summary". ° ° °RANGE OF MOTION AND STRENGTHENING EXERCISES  °Rehabilitation of the knee is important following a knee injury or   an operation. After just a few days of immobilization, the muscles of the thigh which control the knee become weakened and shrink (atrophy). Knee exercises are designed to build up the tone and strength of the thigh muscles and to improve knee motion. Often times heat used for twenty to thirty minutes before working out will loosen up your tissues and help with improving the range of motion but do not use heat for the first two weeks following surgery. These exercises can be done on a training (exercise) mat, on the floor, on a table or on a bed. Use what ever works the best and is most comfortable for you Knee exercises include:  °Leg Lifts - While your knee is still immobilized in a splint or cast, you can do straight leg raises. Lift the leg to 60 degrees, hold for 3 sec, and slowly lower the leg. Repeat 10-20 times 2-3 times daily. Perform this exercise against resistance later as your knee gets better.  °Quad and Hamstring Sets - Tighten up the muscle on the front of the thigh (Quad) and hold for 5-10 sec. Repeat this 10-20 times hourly. Hamstring sets are done by pushing the  foot backward against an object and holding for 5-10 sec. Repeat as with quad sets.  °· Leg Slides: Lying on your back, slowly slide your foot toward your buttocks, bending your knee up off the floor (only go as far as is comfortable). Then slowly slide your foot back down until your leg is flat on the floor again. °· Angel Wings: Lying on your back spread your legs to the side as far apart as you can without causing discomfort.  °A rehabilitation program following serious knee injuries can speed recovery and prevent re-injury in the future due to weakened muscles. Contact your doctor or a physical therapist for more information on knee rehabilitation.  ° °IF YOU ARE TRANSFERRED TO A SKILLED REHAB FACILITY °If the patient is transferred to a skilled rehab facility following release from the hospital, a list of the current medications will be sent to the facility for the patient to continue.  When discharged from the skilled rehab facility, please have the facility set up the patient's Home Health Physical Therapy prior to being released. Also, the skilled facility will be responsible for providing the patient with their medications at time of release from the facility to include their pain medication, the muscle relaxants, and their blood thinner medication. If the patient is still at the rehab facility at time of the two week follow up appointment, the skilled rehab facility will also need to assist the patient in arranging follow up appointment in our office and any transportation needs. ° °MAKE SURE YOU:  °Understand these instructions.  °Get help right away if you are not doing well or get worse.  ° ° °Pick up stool softner and laxative for home use following surgery while on pain medications. °Do not submerge incision under water. °Please use good hand washing techniques while changing dressing each day. °May shower starting three days after surgery. °Please use a clean towel to pat the incision dry following  showers. °Continue to use ice for pain and swelling after surgery. °Do not use any lotions or creams on the incision until instructed by your surgeon. ° °Take Xarelto for two and a half more weeks following discharge from the hospital, then discontinue Xarelto. °Once the patient has completed the Xarelto, they may resume the 81 mg Aspirin. ° ° ° °Information on   my medicine - XARELTO (Rivaroxaban)  This medication education was reviewed with me or my healthcare representative as part of my discharge preparation.  The pharmacist that spoke with me during my hospital stay was:  Tommie Raymond, Student-PharmD  Why was Xarelto prescribed for you? Xarelto was prescribed for you to reduce the risk of blood clots forming after orthopedic surgery. The medical term for these abnormal blood clots is venous thromboembolism (VTE).  What do you need to know about xarelto ? Take your Xarelto ONCE DAILY at the same time every day. You may take it either with or without food.  If you have difficulty swallowing the tablet whole, you may crush it and mix in applesauce just prior to taking your dose.  Take Xarelto exactly as prescribed by your doctor and DO NOT stop taking Xarelto without talking to the doctor who prescribed the medication.  Stopping without other VTE prevention medication to take the place of Xarelto may increase your risk of developing a clot.  After discharge, you should have regular check-up appointments with your healthcare provider that is prescribing your Xarelto.    What do you do if you miss a dose? If you miss a dose, take it as soon as you remember on the same day then continue your regularly scheduled once daily regimen the next day. Do not take two doses of Xarelto on the same day.   Important Safety Information A possible side effect of Xarelto is bleeding. You should call your healthcare provider right away if you experience any of the following: ? Bleeding from an injury  or your nose that does not stop. ? Unusual colored urine (red or dark brown) or unusual colored stools (red or black). ? Unusual bruising for unknown reasons. ? A serious fall or if you hit your head (even if there is no bleeding).  Some medicines may interact with Xarelto and might increase your risk of bleeding while on Xarelto. To help avoid this, consult your healthcare provider or pharmacist prior to using any new prescription or non-prescription medications, including herbals, vitamins, non-steroidal anti-inflammatory drugs (NSAIDs) and supplements.  This website has more information on Xarelto: [email or web address]

## 2016-11-05 NOTE — Evaluation (Signed)
Occupational Therapy Evaluation Patient Details Name: [name] [name] MRN: [id number] DOB: 19-Nov-1940 Today's Date: 11/05/2016    History of Present Illness L TKA   Clinical Impression   This 76 year old female was admitted for the above sx.  She has a h/o knee sx in '02 and remembered precautions. She will benefit from one more OT session to practice shower transfer; unable to complete this session as pt had cramp.     Follow Up Recommendations  No OT follow up    Equipment Recommendations  None recommended by OT    Recommendations for Other Services       Precautions / Restrictions Precautions Precautions: Fall;Knee Required Braces or Orthoses: Knee Immobilizer - Left Knee Immobilizer - Left: Discontinue once straight leg raise with < 10 degree lag Restrictions Weight Bearing Restrictions: No      Mobility Bed Mobility               General bed mobility comments: oob  Transfers Overall transfer level: Needs assistance Equipment used: Rolling walker (2 wheeled) Transfers: Sit to/from Stand Sit to Stand: Min guard         General transfer comment: cues for UE/LE placement    Balance                                            ADL Overall ADL's : Needs assistance/impaired     Grooming: Wash/dry hands;Standing;Supervision/safety   Upper Body Bathing: Set up;Sitting   Lower Body Bathing: Minimal assistance;Sit to/from stand   Upper Body Dressing : Set up;Sitting   Lower Body Dressing: Moderate assistance;Sit to/from stand   Toilet Transfer: Min guard;BSC;RW;Ambulation   Toileting- Water quality scientist and Hygiene: Min guard;Sit to/from stand         General ADL Comments: pt got a cramp walking to bathroom; she wants to practice shower transfer tomorrow.     Vision         Perception     Praxis      Pertinent Vitals/Pain Pain Assessment: No/denies pain Pain Score: 4  Pain Location: rt knee Pain Descriptors /  Indicators: Spasm;Aching Pain Intervention(s): Limited activity within patient's tolerance;Monitored during session;Premedicated before session;Patient requesting pain meds-RN notified;Repositioned;Ice applied     Hand Dominance     Extremity/Trunk Assessment Upper Extremity Assessment Upper Extremity Assessment: Overall WFL for tasks assessed           Communication Communication Communication: No difficulties   Cognition Arousal/Alertness: Awake/alert Behavior During Therapy: WFL for tasks assessed/performed Overall Cognitive Status: Within Functional Limits for tasks assessed                     General Comments       Exercises       Shoulder Instructions      Home Living Family/patient expects to be discharged to:: Private residence Living Arrangements: Spouse/significant other Available Help at Discharge: Family Type of Home: House       Home Layout: One level     Bathroom Shower/Tub: Occupational psychologist: Standard     Home Equipment: Cane - single point;Walker - 2 wheels;Crutches;Bedside commode   Additional Comments: bathroom with walk in shower is small/tight. Daughter who is a PT will be coming tomorrow      Prior Functioning/Environment Level of Independence: Independent  OT Problem List: Decreased activity tolerance;Pain;Decreased knowledge of use of DME or AE      OT Treatment/Interventions: Self-care/ADL training;DME and/or AE instruction;Patient/family education    OT Goals(Current goals can be found in the care plan section) Acute Rehab OT Goals Patient Stated Goal: to walk without pain OT Goal Formulation: With patient Time For Goal Achievement: 11/12/16 Potential to Achieve Goals: Good ADL Goals Pt Will Transfer to Toilet: with supervision;ambulating;bedside commode Pt Will Perform Tub/Shower Transfer: Shower transfer;with min guard assist;ambulating;3 in 1  OT Frequency: Min 2X/week    Barriers to D/C:            Co-evaluation              End of Session    Activity Tolerance: Patient tolerated treatment well Patient left: in chair;with family/visitor present;with chair alarm set  OT Visit Diagnosis: Pain Pain - Right/Left: Left Pain - part of body: Knee                ADL either performed or assessed with clinical judgement  Time: 6770-3403 OT Time Calculation (min): 33 min Charges:  OT General Charges $OT Visit: 1 Procedure OT Evaluation $OT Eval Low Complexity: 1 Procedure OT Treatments $Self Care/Home Management : 8-22 mins G-Codes:     [name] [name], OTR/L [phone] 11/05/2016  [name] [name] 11/05/2016, 12:27 PM

## 2016-11-05 NOTE — Progress Notes (Signed)
Physical Therapy Treatment Patient Details Name: [name] [name] MRN: [id number] DOB: 04-Jan-1941 Today's Date: 11/05/2016    History of Present Illness L TKA    PT Comments    POD # 1 am session Applied KI and instructed on use.  Assisted OOB to amb in hallway.  Performed some TKR TE's followed by ICE.   Follow Up Recommendations  Home health PT;Supervision/Assistance - 24 hour     Equipment Recommendations  None recommended by PT    Recommendations for Other Services       Precautions / Restrictions Precautions Precautions: Fall;Knee Precaution Comments: instructed on KI use for amb and stairs Required Braces or Orthoses: Knee Immobilizer - Left Knee Immobilizer - Left: Discontinue once straight leg raise with < 10 degree lag Restrictions Weight Bearing Restrictions: No Other Position/Activity Restrictions: WBAT    Mobility  Bed Mobility Overal bed mobility: Needs Assistance       Supine to sit: Min assist     General bed mobility comments: assist L LE and increased time  Transfers Overall transfer level: Needs assistance Equipment used: Rolling walker (2 wheeled) Transfers: Sit to/from Stand Sit to Stand: Min guard         General transfer comment: cues for UE/LE placement  Ambulation/Gait Ambulation/Gait assistance: Min guard;Min assist Ambulation Distance (Feet): 35 Feet Assistive device: Rolling walker (2 wheeled) Gait Pattern/deviations: Step-to pattern;Decreased stance time - left;Decreased step length - left;Antalgic Gait velocity: decreased   General Gait Details: cues for sequqnce and posture   Stairs            Wheelchair Mobility    Modified Rankin (Stroke Patients Only)       Balance                                    Cognition Arousal/Alertness: Awake/alert Behavior During Therapy: WFL for tasks assessed/performed Overall Cognitive Status: Within Functional Limits for tasks assessed                       Exercises   Total Knee Replacement TE's 10 reps B LE ankle pumps 10 reps towel squeezes 10 reps knee presses 10 reps heel slides  10 reps SAQ's 10 reps SLR's 10 reps ABD Followed by ICE     General Comments        Pertinent Vitals/Pain Pain Assessment: 0-10 Pain Score: 3  Pain Location: R knee Pain Descriptors / Indicators: Discomfort;Guarding;Operative site guarding Pain Intervention(s): Monitored during session;Repositioned;Ice applied    Home Living Family/patient expects to be discharged to:: Private residence Living Arrangements: Spouse/significant other Available Help at Discharge: Family         Home Equipment: Kasandra Knudsen - single point;Walker - 2 wheels;Crutches;Bedside commode Additional Comments: bathroom with walk in shower is small/tight. Daughter who is a PT will be coming tomorrow    Prior Function            PT Goals (current goals can now be found in the care plan section) Acute Rehab PT Goals Patient Stated Goal: to walk without pain Progress towards PT goals: Progressing toward goals    Frequency    7X/week      PT Plan Current plan remains appropriate    Co-evaluation             End of Session Equipment Utilized During Treatment: Left knee immobilizer;Gait belt Activity Tolerance: Patient tolerated treatment well  Patient left: with call bell/phone within reach;in chair;with chair alarm set Nurse Communication: Mobility status PT Visit Diagnosis: Muscle weakness (generalized) (M62.81);Difficulty in walking, not elsewhere classified (R26.2)     Time: 8381-8403 PT Time Calculation (min) (ACUTE ONLY): 40 min  Charges:  $Gait Training: 8-22 mins $Therapeutic Exercise: 8-22 mins $Therapeutic Activity: 8-22 mins                    G Codes:       Rica Koyanagi  PTA [facility]  Acute  Rehab Pager      [phone]

## 2016-11-05 NOTE — Progress Notes (Signed)
   Subjective: 1 Day Post-Op Procedure(s) (LRB): LEFT TOTAL KNEE ARTHROPLASTY (Left) Patient reports pain as mild and moderate.   Patient seen in rounds for Dr. Wynelle Link on morning rounds. Patient is well, but has had some minor complaints of pain in the knee, requiring pain medications We will resume therapy today. She walked 10 feet day of surgery. Plan is to go Home after hospital stay.  Objective: Vital signs in last 24 hours: Temp:  [97.7 F (36.5 C)-98.6 F (37 C)] 98.5 F (36.9 C) (03/13 1457) Pulse Rate:  [66-91] 91 (03/13 1457) Resp:  [16-18] 16 (03/13 1457) BP: (105-119)/(51-76) 105/76 (03/13 1457) SpO2:  [93 %-99 %] 96 % (03/13 1457)  Intake/Output from previous day:  Intake/Output Summary (Last 24 hours) at 11/05/16 2036 Last data filed at 11/05/16 1858  Gross per 24 hour  Intake             3396 ml  Output             3255 ml  Net              141 ml    Intake/Output this shift: No intake/output data recorded.  Labs:  Recent Labs  11/05/16 0454  HGB 12.7    Recent Labs  11/05/16 0454  WBC 10.5  RBC 3.88  HCT 37.3  PLT 187    Recent Labs  11/05/16 0454  NA 139  K 4.4  CL 109  CO2 25  BUN 9  CREATININE 0.58  GLUCOSE 105*  CALCIUM 8.8*   No results for input(s): LABPT, INR in the last 72 hours.  EXAM General - Patient is Alert, Appropriate and Oriented Extremity - Neurovascular intact Sensation intact distally Intact pulses distally Dorsiflexion/Plantar flexion intact Dressing - dressing C/D/I Motor Function - intact, moving foot and toes well on exam.  Hemovac pulled without difficulty.  Past Medical History:  Diagnosis Date  . Allergy   . Allergy    allergy to spandex "had a bad reaction of blisters when using after a surgery"  . 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 Complication of anesthesia    reports history of "trouble waking up after surgery"  . COPD (chronic obstructive pulmonary disease)  ([place])   . Dysrhythmia    frequent PVC's  . Family history of adverse reaction to anesthesia    mother with history of malignant hypertension  . GERD (gastroesophageal reflux disease)   . Hyperlipidemia   . Hypothyroidism   . Peripheral vascular disease ([place]) 1980   pulmonary embolism  . Pneumonia   . PVC (premature ventricular contraction)   . Shortness of breath dyspnea    with extertion  . Thyroid disease     Assessment/Plan: 1 Day Post-Op Procedure(s) (LRB): LEFT TOTAL KNEE ARTHROPLASTY (Left) Principal Problem:   OA (osteoarthritis) of knee  Estimated body mass index is 23.56 kg/m as calculated from the following:   Height as of this encounter: 5\' 3"  (1.6 m).   Weight as of this encounter: 60.3 kg (133 lb). Advance diet Up with therapy Plan for discharge tomorrow Discharge home with home health  DVT Prophylaxis - Xarelto Weight-Bearing as tolerated to left leg D/C O2 and Pulse OX and try on Room Air  Arlee Muslim, PA-C Orthopaedic Surgery 11/05/2016, 8:36 PM

## 2016-11-05 NOTE — Care Management Note (Signed)
Case Management Note  Patient Details  Name: [name] [name] MRN: [id number] Date of Birth: 06/24/1941  Subjective/Objective:                  L TKA Action/Plan: Discharge planning Expected Discharge Date:                  Expected Discharge Plan:  [place]  In-House Referral:     Discharge planning Services  CM Consult  Post Acute Care Choice:  Home Health Choice offered to:  Patient  DME Arranged:  N/A DME Agency:     HH Arranged:  PT [place]:  [facility] ([facility])  Status of Service:  Completed, signed off  If discussed at [employer], dates discussed:    Additional Comments: CM met with pt in room to offer choice of home health agency. Pt chooses [facility] to render HHPT. Referral called to [facility] rep, Tim with request for [employer].  Pt has all DME needed at home. No other CM needs were communicated. Dellie Catholic, RN 11/05/2016, 2:31 PM

## 2016-11-06 LAB — CBC
HCT: 35.4 % — ABNORMAL LOW (ref 36.0–46.0)
Hemoglobin: 11.7 g/dL — ABNORMAL LOW (ref 12.0–15.0)
MCH: 31.3 pg (ref 26.0–34.0)
MCHC: 33.1 g/dL (ref 30.0–36.0)
MCV: 94.7 fL (ref 78.0–100.0)
PLATELETS: 185 10*3/uL (ref 150–400)
RBC: 3.74 MIL/uL — AB (ref 3.87–5.11)
RDW: 12.9 % (ref 11.5–15.5)
WBC: 9.6 10*3/uL (ref 4.0–10.5)

## 2016-11-06 LAB — BASIC METABOLIC PANEL
Anion gap: 5 (ref 5–15)
BUN: 10 mg/dL (ref 6–20)
CALCIUM: 8.7 mg/dL — AB (ref 8.9–10.3)
CO2: 27 mmol/L (ref 22–32)
CREATININE: 0.55 mg/dL (ref 0.44–1.00)
Chloride: 104 mmol/L (ref 101–111)
GFR calc Af Amer: >60 mL/min (ref >60)
Glucose, Bld: 110 mg/dL — ABNORMAL HIGH (ref 65–99)
POTASSIUM: 4 mmol/L (ref 3.5–5.1)
SODIUM: 136 mmol/L (ref 135–145)

## 2016-11-06 NOTE — Progress Notes (Signed)
Patient cleared by PT and being discharged home. Reviewed discharge education,information and medications. Patient states no questions at this time, states understanding. IV removed and nurse tech helping patient to get dressed and to car.

## 2016-11-06 NOTE — Progress Notes (Signed)
   Subjective: 2 Days Post-Op Procedure(s) (LRB): LEFT TOTAL KNEE ARTHROPLASTY (Left) Patient reports pain as mild.   Patient seen in rounds by Dr. Wynelle Link. Patient is well, but has had some minor complaints of pain in the kne, requiring pain medications Patient is ready to go home  Objective: Vital signs in last 24 hours: Temp:  [97.7 F (36.5 C)-98.5 F (36.9 C)] 97.9 F (36.6 C) (03/14 0505) Pulse Rate:  [69-91] 78 (03/14 0505) Resp:  [16] 16 (03/14 0505) BP: (105-126)/(51-76) 120/51 (03/14 0505) SpO2:  [93 %-96 %] 96 % (03/14 0505)  Intake/Output from previous day:  Intake/Output Summary (Last 24 hours) at 11/06/16 0746 Last data filed at 11/06/16 0735  Gross per 24 hour  Intake           1438.5 ml  Output             3350 ml  Net          -1911.5 ml    Intake/Output this shift: Total I/O In: -  Out: 400 [Urine:400]  Labs:  Recent Labs  11/05/16 0454 11/06/16 0432  HGB 12.7 11.7*    Recent Labs  11/05/16 0454 11/06/16 0432  WBC 10.5 9.6  RBC 3.88 3.74*  HCT 37.3 35.4*  PLT 187 185    Recent Labs  11/05/16 0454 11/06/16 0432  NA 139 136  K 4.4 4.0  CL 109 104  CO2 25 27  BUN 9 10  CREATININE 0.58 0.55  GLUCOSE 105* 110*  CALCIUM 8.8* 8.7*   No results for input(s): LABPT, INR in the last 72 hours.  EXAM: General - Patient is Alert, Appropriate and Oriented Extremity - Neurovascular intact Sensation intact distally Intact pulses distally Dorsiflexion/Plantar flexion intact Incision - clean, dry, no drainage Motor Function - intact, moving foot and toes well on exam.   Assessment/Plan: 2 Days Post-Op Procedure(s) (LRB): LEFT TOTAL KNEE ARTHROPLASTY (Left) Procedure(s) (LRB): LEFT TOTAL KNEE ARTHROPLASTY (Left) Past Medical History:  Diagnosis Date  . Allergy   . Allergy    allergy to spandex "had a bad reaction of blisters when using after a surgery"  . 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  Complication of anesthesia    reports history of "trouble waking up after surgery"  . COPD (chronic obstructive pulmonary disease) ([place])   . Dysrhythmia    frequent PVC's  . Family history of adverse reaction to anesthesia    mother with history of malignant hypertension  . GERD (gastroesophageal reflux disease)   . Hyperlipidemia   . Hypothyroidism   . Peripheral vascular disease ([place]) 1980   pulmonary embolism  . Pneumonia   . PVC (premature ventricular contraction)   . Shortness of breath dyspnea    with extertion  . Thyroid disease    Principal Problem:   OA (osteoarthritis) of knee  Estimated body mass index is 23.56 kg/m as calculated from the following:   Height as of this encounter: 5\' 3"  (1.6 m).   Weight as of this encounter: 60.3 kg (133 lb). Up with therapy Discharge home with home health Diet - Cardiac diet Follow up - in 2 weeks Activity - WBAT Disposition - Home Condition Upon Discharge - Good D/C Meds - See DC Summary DVT Prophylaxis - Xarelto  Arlee Muslim, PA-C Orthopaedic Surgery 11/06/2016, 7:46 AM

## 2016-11-06 NOTE — Progress Notes (Signed)
Occupational Therapy Treatment Patient Details Name: [name] [name] MRN: [id number] DOB: 12-03-1940 Today's Date: 11/06/2016    History of present illness L TKA   OT comments  All education was completed this session  Follow Up Recommendations  No OT follow up    Equipment Recommendations  None recommended by OT    Recommendations for Other Services      Precautions / Restrictions Precautions Precautions: Fall;Knee Precaution Comments: instructed on KI use for amb and stairs Required Braces or Orthoses: Knee Immobilizer - Left Knee Immobilizer - Left: Discontinue once straight leg raise with < 10 degree lag Restrictions Weight Bearing Restrictions: No Other Position/Activity Restrictions: WBAT       Mobility Bed Mobility         Supine to sit: Min assist     General bed mobility comments: assist for LLE  Transfers   Equipment used: Rolling walker (2 wheeled)   Sit to Stand: Supervision         General transfer comment: cues for UE placement    Balance                                   ADL       Grooming: Wash/dry hands;Standing;Supervision/safety                   Toilet Transfer: Min guard;BSC;RW;Ambulation   Toileting- Water quality scientist and Hygiene: Supervision/safety;Sit to/from stand   Tub/ Shower Transfer: Walk-in shower;Min guard;Ambulation;Rolling walker     General ADL Comments: cues for UE placement when pushing up to stand.        Vision                     Perception     Praxis      Cognition   Behavior During Therapy: WFL for tasks assessed/performed Overall Cognitive Status: Within Functional Limits for tasks assessed                         Exercises     Shoulder Instructions       General Comments      Pertinent Vitals/ Pain       Pain Score: 4  Pain Location: R knee Pain Descriptors / Indicators: Discomfort Pain Intervention(s): Limited activity within patient's  tolerance;Monitored during session;Premedicated before session;Repositioned;Heat applied  Home Living                                          Prior Functioning/Environment              Frequency           Progress Toward Goals  OT Goals(current goals can now be found in the care plan section)  Progress towards OT goals: Progressing toward goals (no further OT is needed)     Plan      Co-evaluation                 End of Session    OT Visit Diagnosis: Pain Pain - Right/Left: Left Pain - part of body: Knee   Activity Tolerance Patient tolerated treatment well   Patient Left in chair;with call bell/phone within reach   Nurse Communication          Time: 2683-4196 OT  Time Calculation (min): 26 min  Charges: OT General Charges $OT Visit: 1 Procedure OT Treatments $Self Care/Home Management : 23-37 mins  Lesle Chris, OTR/L [phone] 11/06/2016   [place] 11/06/2016, 10:29 AM

## 2016-11-06 NOTE — Progress Notes (Signed)
Physical Therapy Treatment Patient Details Name: [name] [name] MRN: [id number] DOB: 07/08/1941 Today's Date: 11/06/2016    History of Present Illness L TKA    PT Comments    POD # 2  Session with spouse present for "hands on" training for all listed below.  Assisted with KI, amb, stairs, toileting and HEP. Pt progressing well.  Ready for D/C to home.   Follow Up Recommendations  Home health PT;Supervision/Assistance - 24 hour     Equipment Recommendations  None recommended by PT    Recommendations for Other Services       Precautions / Restrictions Precautions Precautions: Fall;Knee Precaution Comments: instructed spouse on KI use for amb and stairs Required Braces or Orthoses: Knee Immobilizer - Left Knee Immobilizer - Left: Discontinue once straight leg raise with < 10 degree lag Restrictions Weight Bearing Restrictions: No Other Position/Activity Restrictions: WBAT    Mobility  Bed Mobility         Supine to sit: Min assist     General bed mobility comments: OOB in recliner  Transfers Overall transfer level: Needs assistance Equipment used: Rolling walker (2 wheeled) Transfers: Sit to/from Stand Sit to Stand: Supervision         General transfer comment: instructed spouse on proper safe handling.  Also assisted with toileting safety  Ambulation/Gait Ambulation/Gait assistance: Supervision;Min guard Ambulation Distance (Feet): 65 Feet Assistive device: Rolling walker (2 wheeled) Gait Pattern/deviations: Step-to pattern;Decreased stance time - left;Decreased step length - left;Antalgic Gait velocity: decreased   General Gait Details: instructed spouse "hands on" safe handling with amb.     Stairs Stairs: Yes   Stair Management: Two rails;Step to pattern Number of Stairs: 2 General stair comments: with spouse "hands on" instruction on proper tech and sequencing  Wheelchair Mobility    Modified Rankin (Stroke Patients Only)        Balance                                    Cognition Arousal/Alertness: Awake/alert Behavior During Therapy: WFL for tasks assessed/performed Overall Cognitive Status: Within Functional Limits for tasks assessed                      Exercises  reviewed HEP and given handout.    General Comments        Pertinent Vitals/Pain Pain Assessment: 0-10 Pain Score: 3  Pain Location: R knee Pain Descriptors / Indicators: Discomfort;Operative site guarding Pain Intervention(s): Monitored during session;PCA encouraged;Ice applied    Home Living                      Prior Function            PT Goals (current goals can now be found in the care plan section) Progress towards PT goals: Progressing toward goals    Frequency    7X/week      PT Plan Current plan remains appropriate    Co-evaluation             End of Session Equipment Utilized During Treatment: Left knee immobilizer;Gait belt Activity Tolerance: Patient tolerated treatment well Patient left: with call bell/phone within reach;in chair;with chair alarm set Nurse Communication: Mobility status PT Visit Diagnosis: Muscle weakness (generalized) (M62.81);Difficulty in walking, not elsewhere classified (R26.2)     Time: 1126-1208 PT Time Calculation (min) (ACUTE ONLY): 42 min  Charges:  $Gait  Training: 8-22 mins $Therapeutic Exercise: 8-22 mins $Therapeutic Activity: 8-22 mins                    G Codes:       Rica Koyanagi  PTA [facility]  Acute  Rehab Pager      [phone]

## 2016-11-06 NOTE — Progress Notes (Signed)
Patient stating she felt as though she was having "PVC's" which she occasionally has, sees Dr.Fath for and take Tambocor for. Vital Signs: Heart rate: 72, Respiration: 16, Blood Pressure: 112/50, Oxygen fluctuating between 92%-97%. Patient complained of being "dizzy, weak feeling". EKG done that report "uncomfirmed" showed "Normal Sinus Rhythm. Patient then stated she felt like it was the dilaudid "catching up with her", that she took overnight and this morning. Spoke with Barron Alvine in regards to patients complaints, no new orders at this time. No signs or symptoms of acute distress. Will continue to monitor.

## 2016-11-07 DIAGNOSIS — Z79891 Long term (current) use of opiate analgesic: Secondary | ICD-10-CM | POA: Diagnosis not present

## 2016-11-07 DIAGNOSIS — Z7901 Long term (current) use of anticoagulants: Secondary | ICD-10-CM | POA: Diagnosis not present

## 2016-11-07 DIAGNOSIS — I739 Peripheral vascular disease, unspecified: Secondary | ICD-10-CM | POA: Diagnosis not present

## 2016-11-07 DIAGNOSIS — I493 Ventricular premature depolarization: Secondary | ICD-10-CM | POA: Diagnosis not present

## 2016-11-07 DIAGNOSIS — J449 Chronic obstructive pulmonary disease, unspecified: Secondary | ICD-10-CM | POA: Diagnosis not present

## 2016-11-07 DIAGNOSIS — Z471 Aftercare following joint replacement surgery: Secondary | ICD-10-CM | POA: Diagnosis not present

## 2016-11-07 DIAGNOSIS — Z792 Long term (current) use of antibiotics: Secondary | ICD-10-CM | POA: Diagnosis not present

## 2016-11-07 DIAGNOSIS — Z96653 Presence of artificial knee joint, bilateral: Secondary | ICD-10-CM | POA: Diagnosis not present

## 2016-11-07 DIAGNOSIS — E785 Hyperlipidemia, unspecified: Secondary | ICD-10-CM | POA: Diagnosis not present

## 2016-11-07 DIAGNOSIS — Z87891 Personal history of nicotine dependence: Secondary | ICD-10-CM | POA: Diagnosis not present

## 2016-11-07 DIAGNOSIS — Z86711 Personal history of pulmonary embolism: Secondary | ICD-10-CM | POA: Diagnosis not present

## 2016-11-07 DIAGNOSIS — M810 Age-related osteoporosis without current pathological fracture: Secondary | ICD-10-CM | POA: Diagnosis not present

## 2016-11-11 DIAGNOSIS — Z792 Long term (current) use of antibiotics: Secondary | ICD-10-CM | POA: Diagnosis not present

## 2016-11-11 DIAGNOSIS — J449 Chronic obstructive pulmonary disease, unspecified: Secondary | ICD-10-CM | POA: Diagnosis not present

## 2016-11-11 DIAGNOSIS — M810 Age-related osteoporosis without current pathological fracture: Secondary | ICD-10-CM | POA: Diagnosis not present

## 2016-11-11 DIAGNOSIS — Z96653 Presence of artificial knee joint, bilateral: Secondary | ICD-10-CM | POA: Diagnosis not present

## 2016-11-11 DIAGNOSIS — Z471 Aftercare following joint replacement surgery: Secondary | ICD-10-CM | POA: Diagnosis not present

## 2016-11-11 DIAGNOSIS — Z87891 Personal history of nicotine dependence: Secondary | ICD-10-CM | POA: Diagnosis not present

## 2016-11-11 DIAGNOSIS — Z79891 Long term (current) use of opiate analgesic: Secondary | ICD-10-CM | POA: Diagnosis not present

## 2016-11-11 DIAGNOSIS — I739 Peripheral vascular disease, unspecified: Secondary | ICD-10-CM | POA: Diagnosis not present

## 2016-11-11 DIAGNOSIS — E785 Hyperlipidemia, unspecified: Secondary | ICD-10-CM | POA: Diagnosis not present

## 2016-11-11 DIAGNOSIS — Z7901 Long term (current) use of anticoagulants: Secondary | ICD-10-CM | POA: Diagnosis not present

## 2016-11-11 DIAGNOSIS — I493 Ventricular premature depolarization: Secondary | ICD-10-CM | POA: Diagnosis not present

## 2016-11-11 DIAGNOSIS — Z86711 Personal history of pulmonary embolism: Secondary | ICD-10-CM | POA: Diagnosis not present

## 2016-11-13 DIAGNOSIS — Z471 Aftercare following joint replacement surgery: Secondary | ICD-10-CM | POA: Diagnosis not present

## 2016-11-18 DIAGNOSIS — M810 Age-related osteoporosis without current pathological fracture: Secondary | ICD-10-CM | POA: Diagnosis not present

## 2016-11-18 DIAGNOSIS — Z471 Aftercare following joint replacement surgery: Secondary | ICD-10-CM | POA: Diagnosis not present

## 2016-11-18 DIAGNOSIS — I493 Ventricular premature depolarization: Secondary | ICD-10-CM | POA: Diagnosis not present

## 2016-11-18 DIAGNOSIS — Z7901 Long term (current) use of anticoagulants: Secondary | ICD-10-CM | POA: Diagnosis not present

## 2016-11-18 DIAGNOSIS — E785 Hyperlipidemia, unspecified: Secondary | ICD-10-CM | POA: Diagnosis not present

## 2016-11-18 DIAGNOSIS — I739 Peripheral vascular disease, unspecified: Secondary | ICD-10-CM | POA: Diagnosis not present

## 2016-11-18 DIAGNOSIS — Z87891 Personal history of nicotine dependence: Secondary | ICD-10-CM | POA: Diagnosis not present

## 2016-11-18 DIAGNOSIS — Z86711 Personal history of pulmonary embolism: Secondary | ICD-10-CM | POA: Diagnosis not present

## 2016-11-18 DIAGNOSIS — Z96653 Presence of artificial knee joint, bilateral: Secondary | ICD-10-CM | POA: Diagnosis not present

## 2016-11-18 DIAGNOSIS — J449 Chronic obstructive pulmonary disease, unspecified: Secondary | ICD-10-CM | POA: Diagnosis not present

## 2016-11-18 DIAGNOSIS — Z79891 Long term (current) use of opiate analgesic: Secondary | ICD-10-CM | POA: Diagnosis not present

## 2016-11-18 DIAGNOSIS — Z792 Long term (current) use of antibiotics: Secondary | ICD-10-CM | POA: Diagnosis not present

## 2016-11-21 ENCOUNTER — Encounter: Payer: Self-pay | Admitting: Physical Therapy

## 2016-11-21 ENCOUNTER — Ambulatory Visit: Payer: PPO | Attending: Orthopedic Surgery | Admitting: Physical Therapy

## 2016-11-21 DIAGNOSIS — M25562 Pain in left knee: Secondary | ICD-10-CM | POA: Diagnosis not present

## 2016-11-21 DIAGNOSIS — R262 Difficulty in walking, not elsewhere classified: Secondary | ICD-10-CM

## 2016-11-21 DIAGNOSIS — M6281 Muscle weakness (generalized): Secondary | ICD-10-CM | POA: Diagnosis not present

## 2016-11-21 DIAGNOSIS — M25662 Stiffness of left knee, not elsewhere classified: Secondary | ICD-10-CM | POA: Diagnosis not present

## 2016-11-22 NOTE — Therapy (Signed)
[facility] [address], [place], [address] Phone: [phone]   Fax:  [phone]  Physical Therapy Evaluation  Patient Details  Name: [name] [name] MRN: [id number] Date of Birth: August 01, 1941 Referring Provider: Gaynelle Arabian MD; Gerrit Halls [place]  Encounter Date: 11/21/2016      PT End of Session - 11/21/16 1133    Visit Number 1   Number of Visits 12   Date for PT Re-Evaluation 12/19/16   Authorization Type 1   Authorization Time Period 10 (G-code)   PT Start Time 1035   PT Stop Time 1120   PT Time Calculation (min) 45 min   Activity Tolerance Patient tolerated treatment well   Behavior During Therapy [facility] for tasks assessed/performed      Past Medical History:  Diagnosis Date  . Allergy   . Allergy    allergy to spandex "had a bad reaction of blisters when using after a surgery"  . 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 Complication of anesthesia    reports history of "trouble waking up after surgery"  . COPD (chronic obstructive pulmonary disease) ([place])   . Dysrhythmia    frequent PVC's  . Family history of adverse reaction to anesthesia    mother with history of malignant hypertension  . GERD (gastroesophageal reflux disease)   . Hyperlipidemia   . Hypothyroidism   . Peripheral vascular disease ([place]) 1980   pulmonary embolism  . Pneumonia   . PVC (premature ventricular contraction)   . Shortness of breath dyspnea    with extertion  . Thyroid disease     Past Surgical History:  Procedure Laterality Date  . abdominal surgery    . APPENDECTOMY    . BLEPHAROPLASTY Bilateral 11/2014   left eyelid 08/03/2015 Dr. Vickki Muff  . BREAST CYST ASPIRATION Left   . CHOLECYSTECTOMY  03/2004  . [place]   fertility testing  . EYE SURGERY Bilateral 2015   cataract excision  . KNEE SURGERY Right    knee arthroscopy-2000; Total knee  replacement-2002   . LAPAROTOMY  1969   fertility testing  . patelectomy Right   . Removal of medical meniscus  1972  . TOTAL KNEE ARTHROPLASTY Left 11/04/2016   Procedure: LEFT TOTAL KNEE ARTHROPLASTY;  Surgeon: Gaynelle Arabian, MD;  Location: [facility];  Service: Orthopedics;  Laterality: Left;  requests 35mins    There were no vitals filed for this visit.       Subjective Assessment - 11/21/16 1058    Subjective Patient reports her left LE and knee feels stiff. She is exercising at home as instructed from home health and she is beginning to walk with cane at home and using walking outdoors for safety.    Pertinent History Left knee OA with progressive worsening left knee pain over the past 2 years. Right TKA 16 years ago and is now wearing out and will need a revision.    Limitations Standing;Walking;House hold activities;Other (comment);Sitting  outdoor activity, hiking, community activities   Patient Stated Goals walk wihtout assistance, get back to hiking and normal activity    Currently in Pain? Yes   Pain Score 2    Pain Location Knee   Pain Orientation Left   Pain Descriptors / Indicators Discomfort   Pain Type Surgical pain  11/04/2016   Pain Onset 1 to 4 weeks ago   Pain Frequency Intermittent  [facility] PT Assessment - 11/21/16 1048      Assessment   Medical Diagnosis Left TKA   Referring Provider Gaynelle Arabian MD; Gerrit Halls PA   Onset Date/Surgical Date 11/04/16   Hand Dominance Right   Next MD Visit December 10, 2016   Prior Therapy Home health PT x 2 weeks      Precautions   Precautions None     Restrictions   Weight Bearing Restrictions No     Balance Screen   Has the patient fallen in the past 6 months No   Has the patient had a decrease in activity level because of a fear of falling?  No   Is the patient reluctant to leave their home because of a fear of falling?  No     Home Social worker Private residence   Living  Arrangements Spouse/significant other   Type of [place] to enter   Entrance Stairs-Number of Steps 3   Entrance Stairs-Rails Cannot reach both   [place] One level   [place] - 2 wheels;Cane - single point;Bedside commode;Shower seat - built in     Prior Function   Level of Independence Independent   Vocation Retired  Arts development officer   Leisure travelling, hiking, walking     Cognition   Overall Cognitive Status Within Functional Limits for tasks assessed      Objective: Limited assessment done due to time constraints: patient arriving to clinic late Gait: ambulating with rolling walker into clinic with antalgic gait with decreased hip/knee flexion AAROM left knee flexion sitting at edge of treatment table 10-92 degrees flexion Strength: not formally tested due to recent surgery Observation; healing incision left knee anterior aspect without signs of infection, compression stocking in place, mild to moderate swelling noted Palpation: increased warmth left knee as compared to right, decreased patellar mobility   Outcome Measure: LEFS 18/80 (80 = no self perceived disability)  Discussed exercises and pain/swelling control for home: Continue with home exercises given with home health Modify standing hip extension and abduction with controlled motion Sitting AAROM left knee with foot on ball performed following demonstration and with VC Hip adduction with ball and glute sets x 10 reps with VC, following demonstration Instructed in proper elevation and use of ice for pain control and to decrease swelling        PT Education - 11/21/16 1121    Education provided Yes   Education Details HEP; pain and swelling control, exercises for flexion, extension every hour and walk 2x/day   Person(s) Educated Patient   Methods Explanation;Demonstration;Verbal cues   Comprehension Verbalized understanding;Returned demonstration;Verbal cues  required             PT Long Term Goals - 11/21/16 1125      PT LONG TERM GOAL #1   Title Patient will demonstrate improvement with daily tasks with decreased left knee pain as indicated by LEFS score of 40/80 by 12/19/2016   Baseline LEFS 18/80 (80 = no self perceived disability)   Status New     PT LONG TERM GOAL #2   Title patient will demonstrate improved functional community ambulation with least restrictive AD with 10MW of 10 seconds or less by 12/19/2016   Baseline 10 MW to be assessed; currently walking with rolling walker, slow cadence   Status New     PT LONG TERM GOAL #3   Title Patient will be independent with home  program for pain control and progressive exercise for self management by discharge from physical therapy 12/19/2016   Baseline limited knowledge and requires moderate cuing for pain control, exercises and progression   Status New               Plan - 11/21/16 1245    Clinical Impression Statement Patient is a 76 year old female who presents s/p left TKA x 2 weeks. She has limited ROM 10-92 degrees flexion with pain and swelling limiting further motion. She has decreased strength and limited ability with ambulation, using walker for support. Her LEFS score of 18/80 indicates severe self perceived disability. She has limited knowledge of appropriate pain control strategies and exercises and progress in order to return to full function.    Rehab Potential Good   Clinical Impairments Affecting Rehab Potential (+)acute condition, motivated, prior level of function, family support    PT Frequency 3x / week   PT Duration 4 weeks   PT Treatment/Interventions Cryotherapy;Moist Heat;Gait training;Stair training;Patient/family education;Neuromuscular re-education;Therapeutic exercise;Balance training;Therapeutic activities;Manual techniques   PT Next Visit Plan ROM, strengthening, manual therapy STM, patella   PT Home Exercise Plan continue with home program as  given; add AAROM with ball under foot, ball squeeze between knees with glute sets, SLR sitting, standing controlled hip abduction/extension   Consulted and Agree with Plan of Care Patient      Patient will benefit from skilled therapeutic intervention in order to improve the following deficits and impairments:  Decreased strength, Pain, Impaired perceived functional ability, Decreased activity tolerance, Decreased endurance, Difficulty walking, Decreased range of motion  Visit Diagnosis: Stiffness of left knee, not elsewhere classified - Plan: PT plan of care cert/re-cert  Difficulty in walking, not elsewhere classified - Plan: PT plan of care cert/re-cert  Acute pain of left knee - Plan: PT plan of care cert/re-cert  Muscle weakness (generalized) - Plan: PT plan of care cert/re-cert      G-Codes - 31/49/70 1200    Functional Assessment Tool Used (Outpatient Only) LEFS, ROM, pain, strength deficits, clinical judgment   Functional Limitation Mobility: Walking and moving around   Mobility: Walking and Moving Around Current Status (Y6378) At least 60 percent but less than 80 percent impaired, limited or restricted   Mobility: Walking and Moving Around Goal Status (H8850) At least 1 percent but less than 20 percent impaired, limited or restricted       Problem List Patient Active Problem List   Diagnosis Date Noted  . OA (osteoarthritis) of knee 11/04/2016  . COPD (chronic obstructive pulmonary disease) ([place]) 12/04/2015  . Ovarian mass, left 10/04/2015  . Osteoporosis 09/05/2015  . Skin lesion 08/01/2015  . Chronic infection of sinus 06/30/2015  . H/O varicella 06/30/2015  . Lung nodule, solitary 06/30/2015  . Allergic rhinitis 05/26/2015  . Bilateral cataracts 05/26/2015  . 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 Hypercholesteremia 05/26/2015  . Hypothyroidism 05/26/2015  . Cramps of lower extremity 05/26/2015  . Hemorrhoids, internal 05/26/2015  . Cervical pain 05/26/2015   . 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 Acquired trigger finger 05/26/2015  . Diverticulitis 05/26/2015    Jomarie Longs PT 11/22/2016, 6:45 PM  [facility] [address], [place], [address] Phone: [phone]   Fax:  [phone]  Name: [name] [name] MRN: [id number] Date of Birth: [birth year]

## 2016-11-26 ENCOUNTER — Ambulatory Visit: Payer: PPO | Attending: Orthopedic Surgery

## 2016-11-26 DIAGNOSIS — R262 Difficulty in walking, not elsewhere classified: Secondary | ICD-10-CM | POA: Diagnosis not present

## 2016-11-26 DIAGNOSIS — M6281 Muscle weakness (generalized): Secondary | ICD-10-CM | POA: Insufficient documentation

## 2016-11-26 DIAGNOSIS — M25562 Pain in left knee: Secondary | ICD-10-CM | POA: Insufficient documentation

## 2016-11-26 DIAGNOSIS — M25662 Stiffness of left knee, not elsewhere classified: Secondary | ICD-10-CM | POA: Diagnosis not present

## 2016-11-26 NOTE — Therapy (Signed)
[facility] [address], [place], [address] Phone: [phone]   Fax:  [phone]  Physical Therapy Treatment  Patient Details  Name: [name] [name] MRN: [id number] Date of Birth: 02-24-1941 Referring Provider: Gaynelle Arabian MD; Gerrit Halls [place]  Encounter Date: 11/26/2016      PT End of Session - 11/26/16 1120    Visit Number 2   Number of Visits 12   Date for PT Re-Evaluation 12/19/16   Authorization Type 2   Authorization Time Period 10 (G-code)   PT Start Time 1120   PT Stop Time 1205   PT Time Calculation (min) 45 min   Activity Tolerance Patient tolerated treatment well   Behavior During Therapy [facility] for tasks assessed/performed      Past Medical History:  Diagnosis Date  . Allergy   . Allergy    allergy to spandex "had a bad reaction of blisters when using after a surgery"  . 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 Complication of anesthesia    reports history of "trouble waking up after surgery"  . COPD (chronic obstructive pulmonary disease) ([place])   . Dysrhythmia    frequent PVC's  . Family history of adverse reaction to anesthesia    mother with history of malignant hypertension  . GERD (gastroesophageal reflux disease)   . Hyperlipidemia   . Hypothyroidism   . Peripheral vascular disease ([place]) 1980   pulmonary embolism  . Pneumonia   . PVC (premature ventricular contraction)   . Shortness of breath dyspnea    with extertion  . Thyroid disease     Past Surgical History:  Procedure Laterality Date  . abdominal surgery    . APPENDECTOMY    . BLEPHAROPLASTY Bilateral 11/2014   left eyelid 08/03/2015 Dr. Vickki Muff  . BREAST CYST ASPIRATION Left   . CHOLECYSTECTOMY  03/2004  . [place]   fertility testing  . EYE SURGERY Bilateral 2015   cataract excision  . KNEE SURGERY Right    knee arthroscopy-2000; Total knee  replacement-2002   . LAPAROTOMY  1969   fertility testing  . patelectomy Right   . Removal of medical meniscus  1972  . TOTAL KNEE ARTHROPLASTY Left 11/04/2016   Procedure: LEFT TOTAL KNEE ARTHROPLASTY;  Surgeon: Gaynelle Arabian, MD;  Location: [facility];  Service: Orthopedics;  Laterality: Left;  requests 43mins    There were no vitals filed for this visit.      Subjective Assessment - 11/26/16 1123    Subjective Pt states finishing her plavix and currently back on baby asprin. L knee feels achy. Has a couple sore spots medial L knee (distal thigh and proximal medial tibia)   Pertinent History Left knee OA with progressive worsening left knee pain over the past 2 years. Right TKA 16 years ago and is now wearing out and will need a revision.    Limitations Standing;Walking;House hold activities;Other (comment);Sitting  outdoor activity, hiking, community activities   Patient Stated Goals walk wihtout assistance, get back to hiking and normal activity    Currently in Pain? Yes   Pain Score 2    Pain Onset 1 to 4 weeks ago                                 PT Education - 11/26/16 1141  Education provided Yes   Education Details ther-ex   Person(s) Educated Patient   Methods Explanation;Demonstration;Tactile cues;Verbal cues   Comprehension Returned demonstration;Verbalized understanding      Objectives  Seated L knee extension -15 degrees, 84 degrees L knee flexion seated AROM    Manual therapy STM to L hamstrings in supine with leg propped on pillow for knee extension   There-ex  Supine quad set with glute max squeeze 10x with 5 second holds then 8x5 second holds to promote knee extension following manual therapy  Seated L knee flexion PROM 10x3   Then AAROM 10x3   94 degrees seated L knee flexion AROM afterwards  Standing L knee TKE with bilateral UE assist, no resistance 10x2 with 5 second holds  Gait with rw, with emphasis on knee extension  during stance phase and knee flexion during swing phase, and ankle DF during heel strike about 94 ft.   Pt was recommended to perform at home to maintain gains in ROM. Pt demonstrated and verbalized understanding.   Improved exercise technique, movement at target joints, use of target muscles after min to mod verbal, visual, tactile cues.    Stiff end feel with knee flexion and extension ROM. Improved L knee flexion seated AROM following PROM and AAROM to promote knee flexion. Muscle tension palpated at hamstrings which decreased slightly following soft tissue to that muscle. Recommended for patient to perform ankle DF during heel strike, knee extension during stance phase, and knee flexion during swing phase of gait while walking with her rw outside of therapy to maintain ROM gains. Pt demonstrated and verbalized understanding.          PT Long Term Goals - 11/21/16 1125      PT LONG TERM GOAL #1   Title Patient will demonstrate improvement with daily tasks with decreased left knee pain as indicated by LEFS score of 40/80 by 12/19/2016   Baseline LEFS 18/80 (80 = no self perceived disability)   Status New     PT LONG TERM GOAL #2   Title patient will demonstrate improved functional community ambulation with least restrictive AD with 10MW of 10 seconds or less by 12/19/2016   Baseline 10 MW to be assessed; currently walking with rolling walker, slow cadence   Status New     PT LONG TERM GOAL #3   Title Patient will be independent with home program for pain control and progressive exercise for self management by discharge from physical therapy 12/19/2016   Baseline limited knowledge and requires moderate cuing for pain control, exercises and progression   Status New               Plan - 11/26/16 1142    Clinical Impression Statement Stiff end feel with knee flexion and extension ROM. Improved L knee flexion seated AROM following PROM and AAROM to promote knee flexion. Muscle  tension palpated at hamstrings which decreased slightly following soft tissue to that muscle. Recommended for patient to perform ankle DF during heel strike, knee extension during stance phase, and knee flexion during swing phase of gait while walking with her rw outside of therapy to maintain ROM gains. Pt demonstrated and verbalized understanding.    Rehab Potential Good   Clinical Impairments Affecting Rehab Potential (+)acute condition, motivated, prior level of function, family support    PT Frequency 3x / week   PT Duration 4 weeks   PT Treatment/Interventions Cryotherapy;Moist Heat;Gait training;Stair training;Patient/family education;Neuromuscular re-education;Therapeutic exercise;Balance training;Therapeutic activities;Manual techniques  PT Next Visit Plan ROM, strengthening, manual therapy STM, patella   PT Home Exercise Plan continue with home program as given; add AAROM with ball under foot, ball squeeze between knees with glute sets, SLR sitting, standing controlled hip abduction/extension   Consulted and Agree with Plan of Care Patient      Patient will benefit from skilled therapeutic intervention in order to improve the following deficits and impairments:  Decreased strength, Pain, Impaired perceived functional ability, Decreased activity tolerance, Decreased endurance, Difficulty walking, Decreased range of motion  Visit Diagnosis: Stiffness of left knee, not elsewhere classified  Difficulty in walking, not elsewhere classified  Acute pain of left knee  Muscle weakness (generalized)     Problem List Patient Active Problem List   Diagnosis Date Noted  . OA (osteoarthritis) of knee 11/04/2016  . COPD (chronic obstructive pulmonary disease) ([place]) 12/04/2015  . Ovarian mass, left 10/04/2015  . Osteoporosis 09/05/2015  . Skin lesion 08/01/2015  . Chronic infection of sinus 06/30/2015  . H/O varicella 06/30/2015  . Lung nodule, solitary 06/30/2015  . Allergic rhinitis  05/26/2015  . Bilateral cataracts 05/26/2015  . 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 Hypercholesteremia 05/26/2015  . Hypothyroidism 05/26/2015  . Cramps of lower extremity 05/26/2015  . Hemorrhoids, internal 05/26/2015  . Cervical pain 05/26/2015  . 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 Acquired trigger finger 05/26/2015  . Diverticulitis 05/26/2015    Joneen Boers PT, DPT   11/26/2016, 12:22 PM  [facility] [address] [place], [address] Phone: [phone]   Fax:  [phone]  Name: [name] RANDOL MRN: [id number] Date of Birth: 03-04-1941

## 2016-11-28 ENCOUNTER — Encounter: Payer: Self-pay | Admitting: Physical Therapy

## 2016-11-28 ENCOUNTER — Ambulatory Visit: Payer: PPO | Admitting: Physical Therapy

## 2016-11-28 DIAGNOSIS — M25662 Stiffness of left knee, not elsewhere classified: Secondary | ICD-10-CM | POA: Diagnosis not present

## 2016-11-28 DIAGNOSIS — R262 Difficulty in walking, not elsewhere classified: Secondary | ICD-10-CM

## 2016-11-28 DIAGNOSIS — M6281 Muscle weakness (generalized): Secondary | ICD-10-CM

## 2016-11-28 DIAGNOSIS — M25562 Pain in left knee: Secondary | ICD-10-CM

## 2016-11-29 ENCOUNTER — Ambulatory Visit: Payer: PPO | Admitting: Physical Therapy

## 2016-11-29 ENCOUNTER — Encounter: Payer: Self-pay | Admitting: Physical Therapy

## 2016-11-29 DIAGNOSIS — M6281 Muscle weakness (generalized): Secondary | ICD-10-CM

## 2016-11-29 DIAGNOSIS — R262 Difficulty in walking, not elsewhere classified: Secondary | ICD-10-CM

## 2016-11-29 DIAGNOSIS — M25662 Stiffness of left knee, not elsewhere classified: Secondary | ICD-10-CM | POA: Diagnosis not present

## 2016-11-29 DIAGNOSIS — M25562 Pain in left knee: Secondary | ICD-10-CM

## 2016-11-29 NOTE — Therapy (Signed)
[facility] [address], [place], [address] Phone: [phone]   Fax:  [phone]  Physical Therapy Treatment  Patient Details  Name: [name] [name] MRN: [id number] Date of Birth: [birth year] Referring Provider: Gaynelle Arabian MD; Gerrit Halls [place]  Encounter Date: 11/28/2016      PT End of Session - 11/28/16 1023    Visit Number 3   Number of Visits 12   Date for PT Re-Evaluation 12/19/16   Authorization Type 3   Authorization Time Period 10 (G-code)   PT Start Time 1018   PT Stop Time 1105   PT Time Calculation (min) 47 min   Activity Tolerance Patient tolerated treatment well   Behavior During Therapy [facility] for tasks assessed/performed      Past Medical History:  Diagnosis Date  . Allergy   . Allergy    allergy to spandex "had a bad reaction of blisters when using after a surgery"  . 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 Complication of anesthesia    reports history of "trouble waking up after surgery"  . COPD (chronic obstructive pulmonary disease) ([place])   . Dysrhythmia    frequent PVC's  . Family history of adverse reaction to anesthesia    mother with history of malignant hypertension  . GERD (gastroesophageal reflux disease)   . Hyperlipidemia   . Hypothyroidism   . Peripheral vascular disease ([place]) 1980   pulmonary embolism  . Pneumonia   . PVC (premature ventricular contraction)   . Shortness of breath dyspnea    with extertion  . Thyroid disease     Past Surgical History:  Procedure Laterality Date  . abdominal surgery    . APPENDECTOMY    . BLEPHAROPLASTY Bilateral 11/2014   left eyelid 08/03/2015 Dr. Vickki Muff  . BREAST CYST ASPIRATION Left   . CHOLECYSTECTOMY  03/2004  . [place]   fertility testing  . EYE SURGERY Bilateral 2015   cataract excision  . KNEE SURGERY Right    knee arthroscopy-2000; Total knee  replacement-2002   . LAPAROTOMY  1969   fertility testing  . patelectomy Right   . Removal of medical meniscus  1972  . TOTAL KNEE ARTHROPLASTY Left 11/04/2016   Procedure: LEFT TOTAL KNEE ARTHROPLASTY;  Surgeon: Gaynelle Arabian, MD;  [facility]: [facility];  Service: Orthopedics;  Laterality: Left;  requests 56mins    There were no vitals filed for this visit.      Subjective Assessment - 11/28/16 1021   Subjective Patient reports she is exercising and elevating left LE as instructed. She reports increased soreness behind knee lateral aspect in thigh and calf muscles.    Pertinent History Left knee OA with progressive worsening left knee pain over the past 2 years. Right TKA 16 years ago and is now wearing out and will need a revision.    Limitations Standing;Walking;House hold activities;Other (comment);Sitting  outdoor activity, hiking, community activities   Patient Stated Goals walk wihtout assistance, get back to hiking and normal activity    Currently in Pain? Yes   Pain Score 3   Pain Location Knee   Pain Orientation Left   Pain Descriptors / Indicators Aching   Pain Type Surgical pain  11/04/2016   Pain Onset 1 to 4 weeks ago   Pain Frequency Intermittent     Objective: AAROM left knee flexion 10-100 degrees  Observation; left  LE knee with less healing incision anterior aspect Gait: ambulating with front wheeled walker, WBAT left LE, slow cadence, guarded posture Palpation: + spasms left LE hamstring and lateral quadriceps muscle  Treatment: Therapeutic Exercise: patient performed with verbal,tactile cues, demonstration and assistance of therapist Sitting: AAROM left knee with assist of therapist with patient sitting on edge of treatment table Supine lying:  AAROM left LE hip/knee flexion and extension with assistance of therapist with isometric hip extension, knee flexion x 3 sets   Manual therapy: STM performed to left LE quadriceps, hamstring, calf muscles with patient  seated, superficial techniques; goal improve ROM, decrease pain  Modalities: Electrical stimulation: Russian stim. 10/10 cycle applied (2) electrodes to quadriceps/VMO left LE with patient reclined with LE supported on pillow; pt. Performing quad sets/knee extension with each cycle; goal muscle re education High volt estim.clincial program for muscle spasms  (2) electrodes applied to hamstrings/lateral quadriceps intensity to tolerance with patient in reclined position with LE supported on pillow goal: pain, spasms  Patient response to treatment: patient demonstrated improved technique with exercises with moderat VC and assistance for correct alignment. Patient with decreased pain from  4/10 to 2/10. Improved motor control with repetition and cuing, and improved quad control/ability to perform quad set following estim.          PT Education - 11/28/16 1055    Education provided Yes   Education Details HEP re assessed for ROM exercises flexion, extension   Person(s) Educated Patient   Methods Explanation;Verbal cues   Comprehension Verbalized understanding             PT Long Term Goals - 11/21/16 1125      PT LONG TERM GOAL #1   Title Patient will demonstrate improvement with daily tasks with decreased left knee pain as indicated by LEFS score of 40/80 by 12/19/2016   Baseline LEFS 18/80 (80 = no self perceived disability)   Status New     PT LONG TERM GOAL #2   Title patient will demonstrate improved functional community ambulation with least restrictive AD with 10MW of 10 seconds or less by 12/19/2016   Baseline 10 MW to be assessed; currently walking with rolling walker, slow cadence   Status New     PT LONG TERM GOAL #3   Title Patient will be independent with home program for pain control and progressive exercise for self management by discharge from physical therapy 12/19/2016   Baseline limited knowledge and requires moderate cuing for pain control, exercises and  progression   Status New               Plan - 11/28/16 1024    Clinical Impression Statement Patient demonstrates improving motor control, ROM left knee with current treatment. She continues with decreased ROM, strength left knee and difficulty with walking and will require additional physical therapy intervention to achieve goals,    Rehab Potential Good   Clinical Impairments Affecting Rehab Potential (+)acute condition, motivated, prior level of function, family support    PT Frequency 3x / week   PT Duration 4 weeks   PT Treatment/Interventions Cryotherapy;Moist Heat;Gait training;Stair training;Patient/family education;Neuromuscular re-education;Therapeutic exercise;Balance training;Therapeutic activities;Manual techniques   PT Next Visit Plan ROM, strengthening, manual therapy STM, patella mobilization, electrical stimulation for muscle re education   PT Home Exercise Plan continue with home program as given; add AAROM with ball under foot, ball squeeze between knees with glute sets, SLR sitting, standing controlled hip abduction/extension  Patient will benefit from skilled therapeutic intervention in order to improve the following deficits and impairments:  Decreased strength, Pain, Impaired perceived functional ability, Decreased activity tolerance, Decreased endurance, Difficulty walking, Decreased range of motion  Visit Diagnosis: Stiffness of left knee, not elsewhere classified  Difficulty in walking, not elsewhere classified  Acute pain of left knee  Muscle weakness (generalized)     Problem List Patient Active Problem List   Diagnosis Date Noted  . OA (osteoarthritis) of knee 11/04/2016  . COPD (chronic obstructive pulmonary disease) ([place]) 12/04/2015  . Ovarian mass, left 10/04/2015  . Osteoporosis 09/05/2015  . Skin lesion 08/01/2015  . Chronic infection of sinus 06/30/2015  . H/O varicella 06/30/2015  . Lung nodule, solitary 06/30/2015  . Allergic  rhinitis 05/26/2015  . Bilateral cataracts 05/26/2015  . 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 Hypercholesteremia 05/26/2015  . Hypothyroidism 05/26/2015  . Cramps of lower extremity 05/26/2015  . Hemorrhoids, internal 05/26/2015  . Cervical pain 05/26/2015  . 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 Acquired trigger finger 05/26/2015  . Diverticulitis 05/26/2015    Jomarie Longs PT 11/29/2016, 4:53 PM  [facility] [address], [place], [address] Phone: [phone]   Fax:  [phone]  Name: AKARI DEFELICE MRN: [id number] Date of Birth: 02-12-1941

## 2016-11-30 NOTE — Therapy (Signed)
[facility] [address] [place], [address] Phone: [phone]   Fax:  [phone]  Physical Therapy Treatment  Patient Details  Name: [name] [name] MRN: [id number] Date of Birth: July 04, 1941 Referring Provider: Gaynelle Arabian MD; Gerrit Halls [place]  Encounter Date: 11/29/2016      PT End of Session - 11/29/16 1110    Visit Number 4   Number of Visits 12   Date for PT Re-Evaluation 12/19/16   Authorization Type 4   Authorization Time Period 10 (G-code)   PT Start Time 1015   PT Stop Time 1105   PT Time Calculation (min) 50 min   Activity Tolerance Patient tolerated treatment well   Behavior During Therapy [facility] for tasks assessed/performed      Past Medical History:  Diagnosis Date  . Allergy   . Allergy    allergy to spandex "had a bad reaction of blisters when using after a surgery"  . 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 Complication of anesthesia    reports history of "trouble waking up after surgery"  . COPD (chronic obstructive pulmonary disease) ([place])   . Dysrhythmia    frequent PVC's  . Family history of adverse reaction to anesthesia    mother with history of malignant hypertension  . GERD (gastroesophageal reflux disease)   . Hyperlipidemia   . Hypothyroidism   . Peripheral vascular disease ([place]) 1980   pulmonary embolism  . Pneumonia   . PVC (premature ventricular contraction)   . Shortness of breath dyspnea    with extertion  . Thyroid disease     Past Surgical History:  Procedure Laterality Date  . abdominal surgery    . APPENDECTOMY    . BLEPHAROPLASTY Bilateral 11/2014   left eyelid 08/03/2015 Dr. Vickki Muff  . BREAST CYST ASPIRATION Left   . CHOLECYSTECTOMY  03/2004  . [place]   fertility testing  . EYE SURGERY Bilateral 2015   cataract excision  . KNEE SURGERY Right    knee arthroscopy-2000; Total knee  replacement-2002   . LAPAROTOMY  1969   fertility testing  . patelectomy Right   . Removal of medical meniscus  1972  . TOTAL KNEE ARTHROPLASTY Left 11/04/2016   Procedure: LEFT TOTAL KNEE ARTHROPLASTY;  Surgeon: Gaynelle Arabian, MD;  Location: [facility];  Service: Orthopedics;  Laterality: Left;  requests 6mins    There were no vitals filed for this visit.      Subjective Assessment - 11/29/16 1019    Subjective Patient reports soreness today and has been exercising 3x/day and is not comfortable with sleeping in bed yet. She is resting in recliner mostly.    Pertinent History Left knee OA with progressive worsening left knee pain over the past 2 years. Right TKA 16 years ago and is now wearing out and will need a revision.    Limitations Standing;Walking;House hold activities;Other (comment);Sitting  outdoor activity, hiking, community activities   Patient Stated Goals walk wihtout assistance, get back to hiking and normal activity    Currently in Pain? Yes   Pain Score 4    Pain Location Knee   Pain Orientation Left   Pain Descriptors / Indicators Aching   Pain Type Surgical pain  11/04/2016   Pain Onset 1 to 4 weeks ago   Pain Frequency Intermittent      Objective: AAROM left knee flexion 10-100 degrees  Observation; left LE knee with well healing incision anterior aspect; moderate swelling left knee Gait: ambulating with front wheeled walker, WBAT left LE, slow cadence, guarded posture Palpation: + spasms left LE hamstring and lateral quadriceps muscle; increased warmth anterior aspect left knee  Treatment: Therapeutic Exercise: patient performed with verbal,tactile cues, demonstration and assistance of therapist: goal: improve strength, ROM, gait, function Sitting: AAROM left knee with assist of therapist with patient sitting on edge of treatment table Multi angle isometric hamstring curls 30/60/90+ angles, 3 sets with 5-10 second holds Supine lying:  AAROM left LE hip/knee  flexion and extension with assistance of therapist with isometric hip extension, knee flexion x 3 sets   Manual therapy: STM performed to left LE quadriceps, hamstring, calf muscles with patient seated, superficial techniques x 10 min.; goal improve ROM, decrease pain  Modalities: Electrical stimulation x 15 min.: Russian stim. 10/10 cycle applied (2) electrodes to quadriceps/VMO left LE with patient reclined with LE supported on pillow; pt. Performing quad sets/knee extension with each cycle; goal muscle re education High volt estim.clincial program for muscle spasms  (2) electrodes applied to hamstrings/lateral quadriceps intensity to tolerance with patient in reclined position with LE supported on pillow with ice pack applied to same (no adverse reactions noted);  goal: pain, spasms  Patient response to treatment: Patient demonstrated improved flexibility and strength with repetition of exercises with assistance and VC of therapist. Improved ability to perform quad setting with improved knee extension following electrical stimulation.         PT Education - 11/29/16 1100    Education provided Yes   Education Details HEP re assessed to continue and perform isometric knee flexion in sitting   Person(s) Educated Patient   Methods Explanation;Verbal cues;Demonstration   Comprehension Verbalized understanding;Returned demonstration;Verbal cues required             PT Long Term Goals - 11/21/16 1125      PT LONG TERM GOAL #1   Title Patient will demonstrate improvement with daily tasks with decreased left knee pain as indicated by LEFS score of 40/80 by 12/19/2016   Baseline LEFS 18/80 (80 = no self perceived disability)   Status New     PT LONG TERM GOAL #2   Title patient will demonstrate improved functional community ambulation with least restrictive AD with 10MW of 10 seconds or less by 12/19/2016   Baseline 10 MW to be assessed; currently walking with rolling walker, slow  cadence   Status New     PT LONG TERM GOAL #3   Title Patient will be independent with home program for pain control and progressive exercise for self management by discharge from physical therapy 12/19/2016   Baseline limited knowledge and requires moderate cuing for pain control, exercises and progression   Status New               Plan - 11/29/16 1110    Clinical Impression Statement Patient is progressing well with improving ROM, gait patern and strength with quad sets, SLR and hamstrings. She continues with decreased ROM, strength and ambulation and will benefit from continued physical therapy intervention to achieve goals.    Rehab Potential Good   Clinical Impairments Affecting Rehab Potential (+)acute condition, motivated, prior level of function, family support    PT Frequency 3x / week   PT Duration 4 weeks   PT Treatment/Interventions Cryotherapy;Moist Heat;Gait training;Stair training;Patient/family education;Neuromuscular re-education;Therapeutic exercise;Balance training;Therapeutic activities;Manual techniques   PT Next Visit Plan ROM, strengthening, manual  therapy STM, patella mobilization, electrical stimulation for muscle re education   PT Home Exercise Plan continue with home program as given; add AAROM with ball under foot, ball squeeze between knees with glute sets, SLR sitting, standing controlled hip abduction/extension      Patient will benefit from skilled therapeutic intervention in order to improve the following deficits and impairments:  Decreased strength, Pain, Impaired perceived functional ability, Decreased activity tolerance, Decreased endurance, Difficulty walking, Decreased range of motion  Visit Diagnosis: Stiffness of left knee, not elsewhere classified  Difficulty in walking, not elsewhere classified  Acute pain of left knee  Muscle weakness (generalized)     Problem List Patient Active Problem List   Diagnosis Date Noted  . OA  (osteoarthritis) of knee 11/04/2016  . COPD (chronic obstructive pulmonary disease) ([place]) 12/04/2015  . Ovarian mass, left 10/04/2015  . Osteoporosis 09/05/2015  . Skin lesion 08/01/2015  . Chronic infection of sinus 06/30/2015  . H/O varicella 06/30/2015  . Lung nodule, solitary 06/30/2015  . Allergic rhinitis 05/26/2015  . Bilateral cataracts 05/26/2015  . 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 Hypercholesteremia 05/26/2015  . Hypothyroidism 05/26/2015  . Cramps of lower extremity 05/26/2015  . Hemorrhoids, internal 05/26/2015  . Cervical pain 05/26/2015  . 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 Acquired trigger finger 05/26/2015  . Diverticulitis 05/26/2015    Jomarie Longs PT 11/30/2016, 1:51 PM  [facility] [address], [place], [address] Phone: [phone]   Fax:  [phone]  Name: SEBRENA ENGH MRN: [id number] Date of Birth: 08/27/1940

## 2016-12-02 ENCOUNTER — Ambulatory Visit: Payer: PPO | Admitting: Physical Therapy

## 2016-12-02 ENCOUNTER — Encounter: Payer: Self-pay | Admitting: Physical Therapy

## 2016-12-02 DIAGNOSIS — M6281 Muscle weakness (generalized): Secondary | ICD-10-CM

## 2016-12-02 DIAGNOSIS — M25662 Stiffness of left knee, not elsewhere classified: Secondary | ICD-10-CM

## 2016-12-02 DIAGNOSIS — R262 Difficulty in walking, not elsewhere classified: Secondary | ICD-10-CM

## 2016-12-02 DIAGNOSIS — M25562 Pain in left knee: Secondary | ICD-10-CM

## 2016-12-02 NOTE — Therapy (Signed)
[facility] [address] [place], [address] Phone: [phone]   Fax:  [phone]  Physical Therapy Treatment  Patient Details  Name: [name] [name] MRN: [id number] Date of Birth: 1940/11/01 Referring Provider: Gaynelle Arabian MD; Gerrit Halls [place]  Encounter Date: 12/02/2016      PT End of Session - 12/02/16 1534    Visit Number 5   Number of Visits 12   Date for PT Re-Evaluation 12/19/16   Authorization Type 5   Authorization Time Period 10 (G-code)   PT Start Time 1531   PT Stop Time 1634   PT Time Calculation (min) 63 min   Activity Tolerance Patient tolerated treatment well   Behavior During Therapy [facility] for tasks assessed/performed      Past Medical History:  Diagnosis Date  . Allergy   . Allergy    allergy to spandex "had a bad reaction of blisters when using after a surgery"  . 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 Complication of anesthesia    reports history of "trouble waking up after surgery"  . COPD (chronic obstructive pulmonary disease) ([place])   . Dysrhythmia    frequent PVC's  . Family history of adverse reaction to anesthesia    mother with history of malignant hypertension  . GERD (gastroesophageal reflux disease)   . Hyperlipidemia   . Hypothyroidism   . Peripheral vascular disease ([place]) 1980   pulmonary embolism  . Pneumonia   . PVC (premature ventricular contraction)   . Shortness of breath dyspnea    with extertion  . Thyroid disease     Past Surgical History:  Procedure Laterality Date  . abdominal surgery    . APPENDECTOMY    . BLEPHAROPLASTY Bilateral 11/2014   left eyelid 08/03/2015 Dr. Vickki Muff  . BREAST CYST ASPIRATION Left   . CHOLECYSTECTOMY  03/2004  . [place]   fertility testing  . EYE SURGERY Bilateral 2015   cataract excision  . KNEE SURGERY Right    knee arthroscopy-2000; Total knee  replacement-2002   . LAPAROTOMY  1969   fertility testing  . patelectomy Right   . Removal of medical meniscus  1972  . TOTAL KNEE ARTHROPLASTY Left 11/04/2016   Procedure: LEFT TOTAL KNEE ARTHROPLASTY;  Surgeon: Gaynelle Arabian, MD;  Location: [facility];  Service: Orthopedics;  Laterality: Left;  requests 38mins    There were no vitals filed for this visit.      Subjective Assessment - 12/02/16 1532    Subjective Patient reports she is now beginning to walk with cane and is sleeping in bed on right side with pillow between knees and did quite well.    Pertinent History Left knee OA with progressive worsening left knee pain over the past 2 years. Right TKA 16 years ago and is now wearing out and will need a revision.    Limitations Standing;Walking;House hold activities;Other (comment);Sitting  outdoor activity, hiking, community activities   Patient Stated Goals walk wihtout assistance, get back to hiking and normal activity    Currently in Pain? Yes   Pain Score 2    Pain Location Knee   Pain Orientation Left   Pain Descriptors / Indicators Aching;Sore   Pain Type Surgical pain  11/04/2016   Pain Onset 1 to 4 weeks ago   Pain Frequency Intermittent      Objective: AAROM; sitting at edge  of treatment table:  left knee flexion 10-105 degrees pre treatment; 110post treatment AROM left knee sitting: 10 - 100 degrees pre treatment Observation; left LE knee with well healing incision anterior aspect; mild swelling left knee Gait: ambulating with frontwheeled walker, WBAT left LE, slow cadence, guarded posture; instructed in gait with cane with patient performing with good technique following instruction and with VC Palpation: + spasms left LE hamstring and lateral quadriceps muscle; increased warmth anterior aspect left knee; improved from previous session  Treatment: Therapeutic Exercise: patient performed with verbal,tactile cues, demonstration and assistance of therapist: goal:  improve strength, ROM, gait, function Sitting: AAROM left knee with assist of therapist with patient sitting on edge of treatment table Multi angle isometric hamstring curls 30/60/90+ angles, 3 sets with 5-10 second holds; increased AAROM left knee flexion to 110 degrees  Supine lying:  AAROM left LE hip/knee flexion and extension with assistance of therapist with isometric hip extension, knee flexion x 3 sets SLR x 10 reps with non contact guard of therapist; no extension lag noted  Standing: side stepping along airex balance beam x 1-2 min. While using UE support for balance/safety and close supervision of therapist Forward and backward walking with increased base of support with UE support along counter for balance/safety 3 reps ~ 5 steps forward and back  NuStep x 7 min. At end of session for ROM, endurance seat #9, arms #11 (unbilled)  Manual therapy: STM performed to left LE quadriceps, hamstring, calf muscles with patient seated, superficial techniques x 10 min.; goal improve ROM, decrease pain Patella mobilization with patient seated and LE supported by therapist superior, inferior glides, medial lateral glides x 10 reps each  Modalities: Electrical stimulation x 15 min.: Russian stim. 10/10 cycle applied (2) electrodes to quadriceps/VMO left LE with patient reclined with LE supported on pillow; pt. Performing quad sets/knee extension with each cycle; goal muscle re education High volt estim.clincial program for muscle spasms (2) electrodes applied to hamstrings/lateral quadricepsintensity to tolerance with patient in reclined position with LE supported on pillow with ice pack applied to same (no adverse reactions noted);  goal: pain, spasms  Patient response to treatment: patient demonstrated improved technique with exercises with minimal VC for correct alignment. Patient with no increased pain during session. Patient with decreased spasms by 50% following STM. Improved motor  control with repetition and cuing, following estim.        PT Education - 12/02/16 1620    Education provided Yes   Education Details HEP: re assessed; added side stepping and forward and backward walking exercises for home; instructed in ambulation with cane and step through pattern to engage hip extension   Person(s) Educated Patient   Methods Explanation;Demonstration;Verbal cues;Handout   Comprehension Verbalized understanding;Returned demonstration;Verbal cues required             PT Long Term Goals - 11/21/16 1125      PT LONG TERM GOAL #1   Title Patient will demonstrate improvement with daily tasks with decreased left knee pain as indicated by LEFS score of 40/80 by 12/19/2016   Baseline LEFS 18/80 (80 = no self perceived disability)   Status New     PT LONG TERM GOAL #2   Title patient will demonstrate improved functional community ambulation with least restrictive AD with 10MW of 10 seconds or less by 12/19/2016   Baseline 10 MW to be assessed; currently walking with rolling walker, slow cadence   Status New     PT LONG  TERM GOAL #3   Title Patient will be independent with home program for pain control and progressive exercise for self management by discharge from physical therapy 12/19/2016   Baseline limited knowledge and requires moderate cuing for pain control, exercises and progression   Status New               Plan - 12/02/16 1600    Clinical Impression Statement Patient is progressing well with improvement noted in quadriceps control, ROM to 110 degrees flexion and decreasing spasms in hamstring and calf muscles. She is now able to transition to [facility] for ambulation and should continue to improve with additinal physical therapy intervention.    Rehab Potential Good   Clinical Impairments Affecting Rehab Potential (+)acute condition, motivated, prior level of function, family support    PT Frequency 3x / week   PT Duration 4 weeks   PT  Treatment/Interventions Cryotherapy;Moist Heat;Gait training;Stair training;Patient/family education;Neuromuscular re-education;Therapeutic exercise;Balance training;Therapeutic activities;Manual techniques   PT Next Visit Plan ROM, strengthening, manual therapy STM, patella mobilization, electrical stimulation for muscle re education   PT Home Exercise Plan continue with home program as given; add AAROM with ball under foot, ball squeeze between knees with glute sets, SLR sitting, standing controlled hip abduction/extension      Patient will benefit from skilled therapeutic intervention in order to improve the following deficits and impairments:  Decreased strength, Pain, Impaired perceived functional ability, Decreased activity tolerance, Decreased endurance, Difficulty walking, Decreased range of motion  Visit Diagnosis: Stiffness of left knee, not elsewhere classified  Difficulty in walking, not elsewhere classified  Acute pain of left knee  Muscle weakness (generalized)     Problem List Patient Active Problem List   Diagnosis Date Noted  . OA (osteoarthritis) of knee 11/04/2016  . COPD (chronic obstructive pulmonary disease) ([place]) 12/04/2015  . Ovarian mass, left 10/04/2015  . Osteoporosis 09/05/2015  . Skin lesion 08/01/2015  . Chronic infection of sinus 06/30/2015  . H/O varicella 06/30/2015  . Lung nodule, solitary 06/30/2015  . Allergic rhinitis 05/26/2015  . Bilateral cataracts 05/26/2015  . 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 Hypercholesteremia 05/26/2015  . Hypothyroidism 05/26/2015  . Cramps of lower extremity 05/26/2015  . Hemorrhoids, internal 05/26/2015  . Cervical pain 05/26/2015  . 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 Acquired trigger finger 05/26/2015  . Diverticulitis 05/26/2015    Jomarie Longs PT 12/03/2016, 2:25 PM  [facility] [address], [place],  [address] Phone: [phone]   Fax:  [phone]  Name: [name] [name] MRN: [id number] Date of Birth: [birth year]

## 2016-12-03 ENCOUNTER — Telehealth: Payer: Self-pay | Admitting: Physician Assistant

## 2016-12-03 ENCOUNTER — Ambulatory Visit: Payer: PPO | Admitting: Physical Therapy

## 2016-12-03 DIAGNOSIS — J449 Chronic obstructive pulmonary disease, unspecified: Secondary | ICD-10-CM

## 2016-12-03 NOTE — Telephone Encounter (Signed)
Pt needs refill on her   ADVAIR DISKUS 250-50 MCG/DOSE AEPB  tiotropium (SPIRIVA HANDIHALER) 18 MCG inhalation capsule  She uses [facility].  She said they told her that we denied those request and she does not know why.  Her call back is [phone]  Thanks, [name]

## 2016-12-04 MED ORDER — ADVAIR DISKUS 250-50 MCG/DOSE IN AEPB: 1.0000 | INHALATION_SPRAY | Freq: Two times a day (BID) | RESPIRATORY_TRACT | 5 refills | Status: DC

## 2016-12-05 ENCOUNTER — Encounter: Payer: Self-pay | Admitting: Physical Therapy

## 2016-12-05 ENCOUNTER — Other Ambulatory Visit: Payer: Self-pay | Admitting: Physician Assistant

## 2016-12-05 ENCOUNTER — Ambulatory Visit: Payer: PPO | Admitting: Physical Therapy

## 2016-12-05 DIAGNOSIS — J449 Chronic obstructive pulmonary disease, unspecified: Secondary | ICD-10-CM

## 2016-12-05 DIAGNOSIS — M25662 Stiffness of left knee, not elsewhere classified: Secondary | ICD-10-CM

## 2016-12-05 DIAGNOSIS — R262 Difficulty in walking, not elsewhere classified: Secondary | ICD-10-CM

## 2016-12-05 DIAGNOSIS — M6281 Muscle weakness (generalized): Secondary | ICD-10-CM

## 2016-12-05 DIAGNOSIS — M25562 Pain in left knee: Secondary | ICD-10-CM

## 2016-12-05 MED ORDER — TIOTROPIUM BROMIDE MONOHYDRATE 18 MCG IN CAPS: 1.0000 | ORAL_CAPSULE | Freq: Every day | RESPIRATORY_TRACT | 3 refills | Status: DC

## 2016-12-05 NOTE — Addendum Note (Signed)
Addended by: Mar Daring on: 12/05/2016 03:58 PM   Modules accepted: Orders

## 2016-12-05 NOTE — Telephone Encounter (Signed)
Pt contacted office for refill request on the following medications:   tiotropium (SPIRIVA HANDIHALER) 18 MCG inhalation capsule.  [facility] mail order.  Pt request a 90 day supply/1 year of refills.  CB#[phone]/MW

## 2016-12-05 NOTE — Therapy (Signed)
[facility] [address], [place], [address] Phone: [phone]   Fax:  [phone]  Physical Therapy Treatment  Patient Details  Name: [name] [name] MRN: [id number] Date of Birth: [birth year] Referring Provider: Gaynelle Arabian MD; Gerrit Halls [place]  Encounter Date: 12/05/2016      PT End of Session - 12/05/16 1057    Visit Number 6   Number of Visits 12   Date for PT Re-Evaluation 12/19/16   Authorization Type 6   Authorization Time Period 10 (G-code)   PT Start Time 1026   PT Stop Time 1115   PT Time Calculation (min) 49 min   Activity Tolerance Patient tolerated treatment well   Behavior During Therapy [facility] for tasks assessed/performed      Past Medical History:  Diagnosis Date  . Allergy   . Allergy    allergy to spandex "had a bad reaction of blisters when using after a surgery"  . 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 Complication of anesthesia    reports history of "trouble waking up after surgery"  . COPD (chronic obstructive pulmonary disease) ([place])   . Dysrhythmia    frequent PVC's  . Family history of adverse reaction to anesthesia    mother with history of malignant hypertension  . GERD (gastroesophageal reflux disease)   . Hyperlipidemia   . Hypothyroidism   . Peripheral vascular disease ([place]) 1980   pulmonary embolism  . Pneumonia   . PVC (premature ventricular contraction)   . Shortness of breath dyspnea    with extertion  . Thyroid disease     Past Surgical History:  Procedure Laterality Date  . abdominal surgery    . APPENDECTOMY    . BLEPHAROPLASTY Bilateral 11/2014   left eyelid 08/03/2015 Dr. Vickki Muff  . BREAST CYST ASPIRATION Left   . CHOLECYSTECTOMY  03/2004  . [place]   fertility testing  . EYE SURGERY Bilateral 2015   cataract excision  . KNEE SURGERY Right    knee arthroscopy-2000; Total knee  replacement-2002   . LAPAROTOMY  1969   fertility testing  . patelectomy Right   . Removal of medical meniscus  1972  . TOTAL KNEE ARTHROPLASTY Left 11/04/2016   Procedure: LEFT TOTAL KNEE ARTHROPLASTY;  Surgeon: Gaynelle Arabian, MD;  Location: [facility];  Service: Orthopedics;  Laterality: Left;  requests 63mins    There were no vitals filed for this visit.      Subjective Assessment - 12/05/16 1029    Subjective Patient is transitioning to cane during the day and uses walker at night if she needs to get up at night. Patient reports she went out yesterday for a while, sitting in car and not walking much and today is more sore.   Pertinent History Left knee OA with progressive worsening left knee pain over the past 2 years. Right TKA 16 years ago and is now wearing out and will need a revision.    Limitations Standing;Walking;House hold activities;Other (comment);Sitting  outdoor activity, hiking, community activities   Patient Stated Goals walk wihtout assistance, get back to hiking and normal activity    Currently in Pain? Yes   Pain Score 2    Pain Location Knee   Pain Orientation Left   Pain Descriptors / Indicators Aching;Tightness;Sore   Pain Type Surgical pain  11/04/2016   Pain Onset 1 to 4 weeks ago  Pain Frequency Intermittent        Objective:    AAROM pre treatment left knee flexion 10-100 degrees with anterior knee/patellar tendon soreness/pain limiting further motion    Palpation: increased warmth around left knee, decreasing weekly; increased spasms, tone lateral hamstring and calf muscles left LE  Treatment: Therapeutic Exercise: patient performed with verbal,tactile cues, demonstration and assistance of therapist: goal: improve strength, ROM, gait, function Sitting: AAROM left knee with assist of therapist with patient sitting on edge of treatment table 5 reps with guided motion Multi angle isometric hamstring curls 30/60/90+ angles, 3 sets with 5-10 second holds;  increased AAROM left knee flexion to 110 degrees  Supine lying:  AAROM left LE hip/knee flexion and extension with assistance of therapist with isometric hip extension, knee flexion at end ranged, hold 5 seconds, multiple reps x 3 sets  NuStep x 5 min. At end of session for ROM, endurance seat #8, arms #10 (unbilled)  Manual therapy: STM performed to left LE quadriceps, hamstring, calf muscles with patient seated, superficial techniques x 20min.; goal improve ROM, decrease pain Patella mobilization with patella mobilizer with patient seated and LE supported by therapist distraction with superior, inferior glides, medial lateral glides x 3 reps each  Modalities: Electrical stimulation x 20 min.: Russian stim. 10/10 cycle applied (2) electrodes to quadriceps/VMO left LE with patient reclined with LE supported on pillow; pt. Performing quad sets/knee extension with each cycle; goal muscle re education High volt estim.clincial program for muscle spasms (2) electrodes applied to hamstrings/lateral quadricepsintensity to tolerance with patient in reclined position with LE supported on pillow with ice pack applied to same (no adverse reactions noted); goal: pain, spasms  Patient response to treatment: Patient demonstrated improved soft tissue mobility left hamstring, calf muscle and improved ROM by 10 degrees following treatment. Patient demonstrated improved motor control with repetition and VC. Improved pain with walking at end of session.          PT Education - 12/05/16 1100    Education provided Yes   Education Details HEP: resting between increased activity such as going shopping, monitor pain, swelling and manage accordingly   Person(s) Educated Patient   Methods Explanation   Comprehension Verbalized understanding             PT Long Term Goals - 11/21/16 1125      PT LONG TERM GOAL #1   Title Patient will demonstrate improvement with daily tasks with decreased left  knee pain as indicated by LEFS score of 40/80 by 12/19/2016   Baseline LEFS 18/80 (80 = no self perceived disability)   Status New     PT LONG TERM GOAL #2   Title patient will demonstrate improved functional community ambulation with least restrictive AD with 10MW of 10 seconds or less by 12/19/2016   Baseline 10 MW to be assessed; currently walking with rolling walker, slow cadence   Status New     PT LONG TERM GOAL #3   Title Patient will be independent with home program for pain control and progressive exercise for self management by discharge from physical therapy 12/19/2016   Baseline limited knowledge and requires moderate cuing for pain control, exercises and progression   Status New               Plan - 12/05/16 1058    Clinical Impression Statement Paitent continues to progress with exercises and soft tissue mobilization and modalities. Her pain limits exercises performed and ROM today. She demonstrates  good knowledge of pain control, use of ice and elevation for swelling and home exercises. She should continue to improve with additional physical therapy intervention to address pain, soft tissue limitations, decreased strength in order to achieve goals.   Rehab Potential Good   Clinical Impairments Affecting Rehab Potential (+)acute condition, motivated, prior level of function, family support    PT Frequency 3x / week   PT Duration 4 weeks   PT Treatment/Interventions Cryotherapy;Moist Heat;Gait training;Stair training;Patient/family education;Neuromuscular re-education;Therapeutic exercise;Balance training;Therapeutic activities;Manual techniques   PT Next Visit Plan ROM, strengthening, manual therapy STM, patella mobilization, electrical stimulation for muscle re education   PT Home Exercise Plan continue with home program as given; add AAROM with ball under foot, ball squeeze between knees with glute sets, SLR sitting, standing controlled hip abduction/extension       Patient will benefit from skilled therapeutic intervention in order to improve the following deficits and impairments:  Decreased strength, Pain, Impaired perceived functional ability, Decreased activity tolerance, Decreased endurance, Difficulty walking, Decreased range of motion  Visit Diagnosis: Stiffness of left knee, not elsewhere classified  Difficulty in walking, not elsewhere classified  Acute pain of left knee  Muscle weakness (generalized)     Problem List Patient Active Problem List   Diagnosis Date Noted  . OA (osteoarthritis) of knee 11/04/2016  . COPD (chronic obstructive pulmonary disease) ([place]) 12/04/2015  . Ovarian mass, left 10/04/2015  . Osteoporosis 09/05/2015  . Skin lesion 08/01/2015  . Chronic infection of sinus 06/30/2015  . H/O varicella 06/30/2015  . Lung nodule, solitary 06/30/2015  . Allergic rhinitis 05/26/2015  . Bilateral cataracts 05/26/2015  . 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 Hypercholesteremia 05/26/2015  . Hypothyroidism 05/26/2015  . Cramps of lower extremity 05/26/2015  . Hemorrhoids, internal 05/26/2015  . Cervical pain 05/26/2015  . 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 Acquired trigger finger 05/26/2015  . Diverticulitis 05/26/2015    Jomarie Longs PT 12/06/2016, 3:10 PM  [facility] [address] [place], [address] Phone: [phone]   Fax:  [phone]  Name: ATHALENE KOLLE MRN: [id number] Date of Birth: [birth year]

## 2016-12-05 NOTE — Telephone Encounter (Signed)
Pt contacted office for refill request on the following medications:  tiotropium (SPIRIVA HANDIHALER) 18 MCG inhalation capsule.  [employer].  90 day supply with a 1 year of refills.  CB#[phone]/MW

## 2016-12-05 NOTE — Telephone Encounter (Signed)
Sent in

## 2016-12-06 ENCOUNTER — Encounter: Payer: Self-pay | Admitting: Physical Therapy

## 2016-12-06 ENCOUNTER — Ambulatory Visit: Payer: PPO | Admitting: Physical Therapy

## 2016-12-06 DIAGNOSIS — M25662 Stiffness of left knee, not elsewhere classified: Secondary | ICD-10-CM

## 2016-12-06 DIAGNOSIS — M6281 Muscle weakness (generalized): Secondary | ICD-10-CM

## 2016-12-06 DIAGNOSIS — R262 Difficulty in walking, not elsewhere classified: Secondary | ICD-10-CM

## 2016-12-06 DIAGNOSIS — M25562 Pain in left knee: Secondary | ICD-10-CM

## 2016-12-07 NOTE — Therapy (Signed)
[place] [facility] [address] [place], [address] Phone: [phone]   Fax:  [phone]  Physical Therapy Treatment  Patient Details  Name: [name] [name] MRN: [id number] Date of Birth: 1941/04/22 Referring Provider: Gaynelle Arabian MD; Gerrit Halls [place]  Encounter Date: 12/06/2016      PT End of Session - 12/06/16 1130    Visit Number 7   Number of Visits 12   Date for PT Re-Evaluation 12/19/16   Authorization Type 7   Authorization Time Period 10 (G-code)   PT Start Time 1052   PT Stop Time 1140   PT Time Calculation (min) 48 min   Activity Tolerance Patient tolerated treatment well   Behavior During Therapy [facility] for tasks assessed/performed      Past Medical History:  Diagnosis Date  . Allergy   . Allergy    allergy to spandex "had a bad reaction of blisters when using after a surgery"  . 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 Complication of anesthesia    reports history of "trouble waking up after surgery"  . COPD (chronic obstructive pulmonary disease) ([place])   . Dysrhythmia    frequent PVC's  . Family history of adverse reaction to anesthesia    mother with history of malignant hypertension  . GERD (gastroesophageal reflux disease)   . Hyperlipidemia   . Hypothyroidism   . Peripheral vascular disease ([place]) 1980   pulmonary embolism  . Pneumonia   . PVC (premature ventricular contraction)   . Shortness of breath dyspnea    with extertion  . Thyroid disease     Past Surgical History:  Procedure Laterality Date  . abdominal surgery    . APPENDECTOMY    . BLEPHAROPLASTY Bilateral 11/2014   left eyelid 08/03/2015 Dr. Vickki Muff  . BREAST CYST ASPIRATION Left   . CHOLECYSTECTOMY  03/2004  . [place]   fertility testing  . EYE SURGERY Bilateral 2015   cataract excision  . KNEE SURGERY Right    knee arthroscopy-2000; Total knee  replacement-2002   . LAPAROTOMY  1969   fertility testing  . patelectomy Right   . Removal of medical meniscus  1972  . TOTAL KNEE ARTHROPLASTY Left 11/04/2016   Procedure: LEFT TOTAL KNEE ARTHROPLASTY;  Surgeon: Gaynelle Arabian, MD;  [facility];  Service: Orthopedics;  Laterality: Left;  requests 58mins    There were no vitals filed for this visit.      Subjective Assessment - 12/06/16 1100    Subjective Patient reports incresaed stiffness and soreness in front of left knee this morning. She reports she also has increasing hand pain and she may have to go back on meloxicam to help with pain.    Pertinent History Left knee OA with progressive worsening left knee pain over the past 2 years. Right TKA 16 years ago and is now wearing out and will need a revision.    Limitations Standing;Walking;House hold activities;Other (comment);Sitting  outdoor activity, hiking, community activities   Patient Stated Goals walk wihtout assistance, get back to hiking and normal activity    Currently in Pain? Yes   Pain Score 4    Pain Location Knee   Pain Orientation Left   Pain Descriptors / Indicators Aching;Sore;Tightness   Pain Type Surgical pain  11/04/2016   Pain Onset 1 to 4 weeks ago   Pain Frequency Intermittent  Objective:    AAROM pre treatment left knee flexion 10- 95 degrees with anterior knee/patellar tendon soreness/pain    limiting further motion    Palpation: increased warmth around left knee; increased spasms, tone lateral hamstring and calf    muscles left LE, decreased soft tisue mobility along incision  Treatment: Therapeutic Exercise: patient performed with verbal,tactile cues, demonstration and assistance of therapist: goal: improve strength, ROM, gait, function Sitting: AAROM left knee with assist of therapist with patient sitting on edge of treatment table 5 reps with guided motion Multi angle isometric hamstring curls with graded manual resistance of  therapist; 30/60/90+ angles, 3 sets with 5-10 second holds; increased AAROM left knee flexion to 105 degrees and improved strength noted with ability to perform exercise against increased resistance with each repetitoin Hip abduction with red resistive band performed with assistance of therapist 10 reps with hold at end range with patient reporting increased soreness in left knee Clam exercise in sitting against red resistive band with guidance of therapist x 10 reps with patient reporting increased soreness in left knee  Manual therapy: STM performed to left LE quadriceps, hamstring, calf muscles with patient seated, superficial techniques x 12 min.; goal improve ROM, decrease pain Patella mobilization with patella mobilizer with patient supine and LE supported by pillow distraction with superior, inferior glides, medial lateral glides x 3 reps each  Modalities: Electrical stimulation x 20 min.: Russian stim. 10/10 cycle applied (2) electrodes to quadriceps/VMO left LE with patient reclined with LE supported on pillow; pt. Performing quad sets/knee extension with each cycle; goal muscle re education High volt estim.clincial program for muscle spasms (2) electrodes applied to hamstrings/lateral quadricepsintensity to tolerance with patient in reclined position with LE supported on pillow with moist heat applied to hamstring/calf and left hip region (no adverse reactions noted); goal: pain, spasms  Patient ambulated using forward wheeled walker at end of session and reported significant decrease in left knee pain  Patient response to treatment: Patient demonstrated improved AAROM in left knee flexion by 10 degrees following STM and isometric knee exercises. She was able to ambulate with rolling walker with less pain in left knee at end of session 2/10. Improved soft tissue mobility and decreased tenderness by >50% following STM and modalities.       PT Education - 12/06/16 1120    Education  provided Yes   Education Details HEP: modified exercises and discussed using walker in the evening when her knee is more painful and she is tired, use cane in the morning   Person(s) Educated Patient   Methods Explanation   Comprehension Verbalized understanding             PT Long Term Goals - 11/21/16 1125      PT LONG TERM GOAL #1   Title Patient will demonstrate improvement with daily tasks with decreased left knee pain as indicated by LEFS score of 40/80 by 12/19/2016   Baseline LEFS 18/80 (80 = no self perceived disability)   Status New     PT LONG TERM GOAL #2   Title patient will demonstrate improved functional community ambulation with least restrictive AD with 10MW of 10 seconds or less by 12/19/2016   Baseline 10 MW to be assessed; currently walking with rolling walker, slow cadence   Status New     PT LONG TERM GOAL #3   Title Patient will be independent with home program for pain control and progressive exercise for self management by discharge from physical  therapy 12/19/2016   Baseline limited knowledge and requires moderate cuing for pain control, exercises and progression   Status New               Plan - 12/06/16 1145    Clinical Impression Statement Patient's increased pain appears to be related to increased weight bearing with transitioning to cane. she may benefit from using walker for part of her day and transition back to cane as her pain subsides. She is also going to add back her meloxicam to assist with pain control per her report. She continues to benefit from physical therapy to improve ROM, strength and ambulation.    Rehab Potential Good   Clinical Impairments Affecting Rehab Potential (+)acute condition, motivated, prior level of function, family support    PT Frequency 3x / week   PT Duration 4 weeks   PT Treatment/Interventions Cryotherapy;Moist Heat;Gait training;Stair training;Patient/family education;Neuromuscular re-education;Therapeutic  exercise;Balance training;Therapeutic activities;Manual techniques   PT Next Visit Plan ROM, strengthening, manual therapy STM, patella mobilization, electrical stimulation for muscle re education   PT Home Exercise Plan continue with home program as given; add AAROM with ball under foot, ball squeeze between knees with glute sets, SLR sitting, standing controlled hip abduction/extension      Patient will benefit from skilled therapeutic intervention in order to improve the following deficits and impairments:  Decreased strength, Pain, Impaired perceived functional ability, Decreased activity tolerance, Decreased endurance, Difficulty walking, Decreased range of motion, Cardiopulmonary status limiting activity  Visit Diagnosis: Acute pain of left knee  Stiffness of left knee, not elsewhere classified  Muscle weakness (generalized)  Difficulty in walking, not elsewhere classified     Problem List Patient Active Problem List   Diagnosis Date Noted  . OA (osteoarthritis) of knee 11/04/2016  . COPD (chronic obstructive pulmonary disease) ([place]) 12/04/2015  . Ovarian mass, left 10/04/2015  . Osteoporosis 09/05/2015  . Skin lesion 08/01/2015  . Chronic infection of sinus 06/30/2015  . H/O varicella 06/30/2015  . Lung nodule, solitary 06/30/2015  . Allergic rhinitis 05/26/2015  . Bilateral cataracts 05/26/2015  . 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 Hypercholesteremia 05/26/2015  . Hypothyroidism 05/26/2015  . Cramps of lower extremity 05/26/2015  . Hemorrhoids, internal 05/26/2015  . Cervical pain 05/26/2015  . 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 Acquired trigger finger 05/26/2015  . Diverticulitis 05/26/2015    Jomarie Longs PT 12/07/2016, 3:43 PM  [facility] [address], [place], [address] Phone: [phone]   Fax:  [phone]  Name: [name] [name] MRN: [id number] Date of Birth:  [birth year]

## 2016-12-09 ENCOUNTER — Encounter: Payer: Self-pay | Admitting: Physician Assistant

## 2016-12-09 ENCOUNTER — Ambulatory Visit: Payer: PPO

## 2016-12-09 DIAGNOSIS — M25662 Stiffness of left knee, not elsewhere classified: Secondary | ICD-10-CM | POA: Diagnosis not present

## 2016-12-09 DIAGNOSIS — M25562 Pain in left knee: Secondary | ICD-10-CM

## 2016-12-09 DIAGNOSIS — R262 Difficulty in walking, not elsewhere classified: Secondary | ICD-10-CM

## 2016-12-09 DIAGNOSIS — M6281 Muscle weakness (generalized): Secondary | ICD-10-CM

## 2016-12-09 NOTE — Therapy (Signed)
[place] [address], [place], [address] Phone: [phone]   Fax:  [phone]  Physical Therapy Treatment   Patient Details  Name: [name] [name] MRN: [id number] Date of Birth: September 13, 1940 Referring Provider: Gaynelle Arabian MD; Gerrit Halls [place]  Encounter Date: 12/09/2016      PT End of Session - 12/09/16 1642    Visit Number 8   Number of Visits 12   Date for PT Re-Evaluation 12/19/16   Authorization Type 8   Authorization Time Period 10 (G-code)   PT Start Time 1643   PT Stop Time 1742   PT Time Calculation (min) 59 min   Activity Tolerance Patient tolerated treatment well   Behavior During Therapy [facility] for tasks assessed/performed      Past Medical History:  Diagnosis Date  . Allergy   . Allergy    allergy to spandex "had a bad reaction of blisters when using after a surgery"  . 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 Complication of anesthesia    reports history of "trouble waking up after surgery"  . COPD (chronic obstructive pulmonary disease) ([place])   . Dysrhythmia    frequent PVC's  . Family history of adverse reaction to anesthesia    mother with history of malignant hypertension  . GERD (gastroesophageal reflux disease)   . Hyperlipidemia   . Hypothyroidism   . Peripheral vascular disease ([place]) 1980   pulmonary embolism  . Pneumonia   . PVC (premature ventricular contraction)   . Shortness of breath dyspnea    with extertion  . Thyroid disease     Past Surgical History:  Procedure Laterality Date  . abdominal surgery    . APPENDECTOMY    . BLEPHAROPLASTY Bilateral 11/2014   left eyelid 08/03/2015 Dr. Vickki Muff  . BREAST CYST ASPIRATION Left   . CHOLECYSTECTOMY  03/2004  . [place]   fertility testing  . EYE SURGERY Bilateral 2015   cataract excision  . KNEE SURGERY Right    knee arthroscopy-2000; Total knee  replacement-2002   . LAPAROTOMY  1969   fertility testing  . patelectomy Right   . Removal of medical meniscus  1972  . TOTAL KNEE ARTHROPLASTY Left 11/04/2016   Procedure: LEFT TOTAL KNEE ARTHROPLASTY;  Surgeon: Gaynelle Arabian, MD;  Location: [facility];  Service: Orthopedics;  Laterality: Left;  requests 69mins    There were no vitals filed for this visit.      Subjective Assessment - 12/09/16 1645    Subjective Pt states going back to meloxicam because her L knee was bothering her. L knee feels better and the hip is better since last week.  1.5/10 L knee pain currently.  Has a follow up appointment with her surgeon tomorrow.    Pertinent History Left knee OA with progressive worsening left knee pain over the past 2 years. Right TKA 16 years ago and is now wearing out and will need a revision.    Limitations Standing;Walking;House hold activities;Other (comment);Sitting  outdoor activity, hiking, community activities   Patient Stated Goals walk wihtout assistance, get back to hiking and normal activity    Currently in Pain? Yes   Pain Score 2    Pain Onset 1 to 4 weeks ago            [facility] PT Assessment - 12/09/16 1748      Observation/Other Assessments  Lower Extremity Functional Scale  26/80                             PT Education - 12/09/16 1729    Education provided Yes   Education Details ther-ex   Person(s) Educated Patient   Methods Explanation;Demonstration;Tactile cues;Verbal cues   Comprehension Returned demonstration;Verbalized understanding          Objective:   AROM seated at start of session: -12 to 91 degrees  Palpation: increased warmth around left knee    Treatment: Therapeutic Exercise:   Sitting: AAROM left knee with assist of therapist with patient sitting on edge of treatment table 5 reps with guided motion Multi angle isometric hamstring curls with graded manual resistance of therapist; 30/60/90+ angles, 3 sets  with 5-10 second holds;   Seated knee flexion PROM 10x2  Then AAROM with PT assist to end range 10x2   105 degrees seated L knee flexion AROM afterwards.  Clam exercise in sitting against red resistive band (towel padding for comfort) with guidance of therapist x 4 reps. L lateral hip discomfort which eases with rest. 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   Seated clamsell isometrics 1 min at 40% effort.   No L hip discomfort.   Improved exercise technique, movement at target joints, use of target muscles after min to  mod verbal, visual, tactile cues.      Manual therapy: STM performed to left LE quadriceps, hamstring, calf muscles, peri scar tissue area with patient in a reclined position, superficial techniques x 10 min.; goal improve ROM, decrease pain   Modalities: Electrical stimulation x 67min.: Russian stim 18 mA . 10/10 cycle applied (2) electrodes to quadriceps/VMO left LE with patient reclined with LE supported on pillow; pt. Performing quad sets/knee extension with each cycle; goal muscle re education High volt estim (125 V).clincial program for muscle spasms (2) electrodes applied to hamstrings/lateral quadricepsintensity to tolerance with patient in reclined position with LE supported on pillow (no adverse reactions noted); goal: pain, spasms    Pt currently ambulating with her SPC. Pt also demonstrates improved seated L knee flexion AROM to 105 degrees today following gentle knee flexion ROM exercises and improved LEFS score to 26/80 (compared to 18/80 at initial evaluation) suggesting improved function. Pt still demonstrates limited L knee ROM, soft tissue limitations, weakness, and difficulty performing functional tasks and would benefit from continued skilled physical therapy services to address the aforementioned deficits.            PT Long Term Goals - 12/09/16 1730      PT LONG TERM GOAL #1   Title Patient will demonstrate improvement with daily  tasks with decreased left knee pain as indicated by LEFS score of 40/80 by 12/19/2016   Baseline LEFS 18/80 (80 = no self perceived disability); 26/80 (12/09/2016)   Status On-going     PT LONG TERM GOAL #2   Title patient will demonstrate improved functional community ambulation with least restrictive AD with 10MW of 10 seconds or less by 12/19/2016   Baseline 10 MW to be assessed; currently walking with rolling walker, slow cadence   Status On-going     PT LONG TERM GOAL #3   Title Patient will be independent with home program for pain control and progressive exercise for self management by discharge from physical therapy 12/19/2016   Baseline limited knowledge and requires moderate cuing for pain control, exercises and progression  Status On-going               Plan - 12/09/16 1730    Clinical Impression Statement Pt currently ambulating with her SPC. Pt also demonstrates improved seated L knee flexion AROM to 105 degrees today following gentle knee flexion ROM exercises and improved LEFS score to 26/80 (compared to 18/80 at initial evaluation) suggesting improved function. Pt still demonstrates limited L knee ROM, soft tissue limitations, weakness, and difficulty performing functional tasks and would benefit from continued skilled physical therapy services to address the aforementioned deficits.    Rehab Potential Good   Clinical Impairments Affecting Rehab Potential (+)acute condition, motivated, prior level of function, family support    PT Frequency 3x / week   PT Duration 4 weeks   PT Treatment/Interventions Cryotherapy;Moist Heat;Gait training;Stair training;Patient/family education;Neuromuscular re-education;Therapeutic exercise;Balance training;Therapeutic activities;Manual techniques   PT Next Visit Plan ROM, strengthening, manual therapy STM, patella mobilization, electrical stimulation for muscle re education   PT Home Exercise Plan continue with home program as given; add  AAROM with ball under foot, ball squeeze between knees with glute sets, SLR sitting, standing controlled hip abduction/extension      Patient will benefit from skilled therapeutic intervention in order to improve the following deficits and impairments:  Decreased strength, Pain, Impaired perceived functional ability, Decreased activity tolerance, Decreased endurance, Difficulty walking, Decreased range of motion, Cardiopulmonary status limiting activity  Visit Diagnosis: Acute pain of left knee  Stiffness of left knee, not elsewhere classified  Muscle weakness (generalized)  Difficulty in walking, not elsewhere classified     Problem List Patient Active Problem List   Diagnosis Date Noted  . OA (osteoarthritis) of knee 11/04/2016  . COPD (chronic obstructive pulmonary disease) ([place]) 12/04/2015  . Ovarian mass, left 10/04/2015  . Osteoporosis 09/05/2015  . Skin lesion 08/01/2015  . Chronic infection of sinus 06/30/2015  . H/O varicella 06/30/2015  . Lung nodule, solitary 06/30/2015  . Allergic rhinitis 05/26/2015  . Bilateral cataracts 05/26/2015  . 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 Hypercholesteremia 05/26/2015  . Hypothyroidism 05/26/2015  . Cramps of lower extremity 05/26/2015  . Hemorrhoids, internal 05/26/2015  . Cervical pain 05/26/2015  . 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 Acquired trigger finger 05/26/2015  . Diverticulitis 05/26/2015    Thank you for your referral.   Joneen Boers PT, DPT  12/09/2016, 5:57 PM  [place] [facility] [address], [place], [address] Phone: [phone]   Fax:  [phone]  Name: [name] [name] MRN: [id number] Date of Birth: 02-19-1941

## 2016-12-10 ENCOUNTER — Encounter: Payer: PPO | Admitting: Physical Therapy

## 2016-12-10 DIAGNOSIS — Z471 Aftercare following joint replacement surgery: Secondary | ICD-10-CM | POA: Diagnosis not present

## 2016-12-10 DIAGNOSIS — Z96652 Presence of left artificial knee joint: Secondary | ICD-10-CM | POA: Diagnosis not present

## 2016-12-13 ENCOUNTER — Encounter: Payer: Self-pay | Admitting: Physical Therapy

## 2016-12-13 ENCOUNTER — Ambulatory Visit: Payer: PPO | Admitting: Physical Therapy

## 2016-12-13 ENCOUNTER — Encounter: Payer: PPO | Admitting: Physical Therapy

## 2016-12-13 DIAGNOSIS — M25562 Pain in left knee: Secondary | ICD-10-CM

## 2016-12-13 DIAGNOSIS — M25662 Stiffness of left knee, not elsewhere classified: Secondary | ICD-10-CM | POA: Diagnosis not present

## 2016-12-13 DIAGNOSIS — M6281 Muscle weakness (generalized): Secondary | ICD-10-CM

## 2016-12-13 DIAGNOSIS — R262 Difficulty in walking, not elsewhere classified: Secondary | ICD-10-CM

## 2016-12-14 NOTE — Therapy (Signed)
[facility] [address] [place], [address] Phone: [phone]   Fax:  [phone]  Physical Therapy Treatment  Patient Details  Name: [name] [name] MRN: [id number] Date of Birth: [birth year] Referring Provider: Gaynelle Arabian MD; Gerrit Halls [place]  Encounter Date: 12/13/2016      PT End of Session - 12/13/16 1049    Visit Number 9   Number of Visits 12   Date for PT Re-Evaluation 12/19/16   Authorization Type 9   Authorization Time Period 10 (G-code)   PT Start Time 1045   PT Stop Time 1133   PT Time Calculation (min) 48 min   Activity Tolerance Patient tolerated treatment well   Behavior During Therapy [facility] for tasks assessed/performed      Past Medical History:  Diagnosis Date  . Allergy   . Allergy    allergy to spandex "had a bad reaction of blisters when using after a surgery"  . 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 Complication of anesthesia    reports history of "trouble waking up after surgery"  . COPD (chronic obstructive pulmonary disease) ([place])   . Dysrhythmia    frequent PVC's  . Family history of adverse reaction to anesthesia    mother with history of malignant hypertension  . GERD (gastroesophageal reflux disease)   . Hyperlipidemia   . Hypothyroidism   . Peripheral vascular disease ([place]) 1980   pulmonary embolism  . Pneumonia   . PVC (premature ventricular contraction)   . Shortness of breath dyspnea    with extertion  . Thyroid disease     Past Surgical History:  Procedure Laterality Date  . abdominal surgery    . APPENDECTOMY    . BLEPHAROPLASTY Bilateral 11/2014   left eyelid 08/03/2015 Dr. Vickki Muff  . BREAST CYST ASPIRATION Left   . CHOLECYSTECTOMY  03/2004  . [place]   fertility testing  . EYE SURGERY Bilateral 2015   cataract excision  . KNEE SURGERY Right    knee arthroscopy-2000; Total knee  replacement-2002   . LAPAROTOMY  1969   fertility testing  . patelectomy Right   . Removal of medical meniscus  1972  . TOTAL KNEE ARTHROPLASTY Left 11/04/2016   Procedure: LEFT TOTAL KNEE ARTHROPLASTY;  Surgeon: Gaynelle Arabian, MD;  [facility];  Service: Orthopedics;  Laterality: Left;  requests 64mins    There were no vitals filed for this visit.      Subjective Assessment - 12/13/16 1046    Subjective Patient reports she is doing much better and got a good report from MD and was able to bend knee to 113 degrees.    Limitations Standing;Walking;House hold activities;Other (comment);Sitting   Patient Stated Goals walk wihtout assistance, get back to hiking and normal activity    Currently in Pain? Yes   Pain Score 1    Pain Location Knee   Pain Orientation Left   Pain Descriptors / Indicators Aching;Sore   Pain Type Surgical pain  11/04/2016   Pain Onset More than a month ago       Objective:  AROM: left knee flexion pre treatment 0-105 sitting at edge of treatment table, 0-110 post treatment Gait: ambulating with SPC with good cadence, reports back of knee is still hurting Palpation: decreased soft tissue mobility along lateral quadriceps and hamstring left LE   Treatment: Therapeutic Exercise: patient performed with  verbal,tactile cues, demonstration and assistance of therapist: goal: improve strength, ROM, gait, function Prone lying following STM patieint performed AAROM left knee flexion with hip in good alignment: patient reported increased discomfort with knee flexion with hip rotated more neutral rather than IR  Supine lying:  AAROM/mild stretching to left hip for improving ER, 3 sets, 3-5 reps with 10 second holds AAROM left LE hip/knee flexion and extension with assistance of therapist with isometric hip extension, knee flexion at end ranged, hold 5 seconds, multiple reps x 3 sets Manual therapy: STM performed to left LE quadriceps, calf muscles with patient  seated and hamstring/lateral quadriceps with patient prone lying, superficial techniques x 18 min.; goal improve ROM, decrease pain Patella mobilization with patella mobilizer with patient seated long sitting and towel under knee and ankle distraction with superior, inferior glides, medial lateral glides x 3 reps Modalities Electrical stimulation x 15 min.: Russian stim. 10/10 cycle applied (2) electrodes to quadriceps/VMO left LE with patient reclined with LE supported on pillow; pt. Performing quad sets/knee extension with each cycle; goal muscle re education High volt estim.clincial program for muscle spasms (2) electrodes applied to lateral quadricepsintensity to tolerance with patient in reclined position with LE supported on pillow with moist heat applied to quadriceps/left hip region(no adverse reactions noted); goal: pain, spasms   Patient response to treatment: patient demonstrated improved technique with exercises with minimal VC for correct alignment. Patient with no increased pain at end of session. Improved ability to perform knee flexion and ambulate with less knee stiffness/discomfort. Patient with decreased spasms by >50% following STM. Improved motor control with quadriceps setting following estim.        PT Education - 12/13/16 1048    Education provided Yes   Education Details HEP: continue as instructed, left hip ER, self massage to lateral quadriceps   Person(s) Educated Patient   Methods Explanation;Demonstration;Verbal cues   Comprehension Verbalized understanding;Returned demonstration;Verbal cues required             PT Long Term Goals - 12/09/16 1730      PT LONG TERM GOAL #1   Title Patient will demonstrate improvement with daily tasks with decreased left knee pain as indicated by LEFS score of 40/80 by 12/19/2016   Baseline LEFS 18/80 (80 = no self perceived disability); 26/80 (12/09/2016)   Status On-going     PT LONG TERM GOAL #2   Title patient will  demonstrate improved functional community ambulation with least restrictive AD with 10MW of 10 seconds or less by 12/19/2016   Baseline 10 MW to be assessed; currently walking with rolling walker, slow cadence   Status On-going     PT LONG TERM GOAL #3   Title Patient will be independent with home program for pain control and progressive exercise for self management by discharge from physical therapy 12/19/2016   Baseline limited knowledge and requires moderate cuing for pain control, exercises and progression   Status On-going               Plan - 12/13/16 1135    Clinical Impression Statement Patient is progressing well towards goals with decreasing swelling and pain, improving ROM and ability to ambulate with SPC with good cacence and gait pattern. She continues with pain, stiffness and decreased endurance s/p left TKA and will require additional physicla therapy intervention to achieve goals.    Rehab Potential Good   Clinical Impairments Affecting Rehab Potential (+)acute condition, motivated, prior level of function, family support  PT Frequency 3x / week   PT Duration 4 weeks   PT Treatment/Interventions Cryotherapy;Moist Heat;Gait training;Stair training;Patient/family education;Neuromuscular re-education;Therapeutic exercise;Balance training;Therapeutic activities;Manual techniques   PT Next Visit Plan ROM, strengthening, manual therapy STM, patella mobilization, electrical stimulation for muscle re education   PT Home Exercise Plan continue with home program as given; add AAROM with ball under foot, ball squeeze between knees with glute sets, SLR sitting, standing controlled hip abduction/extension      Patient will benefit from skilled therapeutic intervention in order to improve the following deficits and impairments:  Decreased strength, Pain, Impaired perceived functional ability, Decreased activity tolerance, Decreased endurance, Difficulty walking, Decreased range of  motion, Cardiopulmonary status limiting activity  Visit Diagnosis: Acute pain of left knee  Stiffness of left knee, not elsewhere classified  Muscle weakness (generalized)  Difficulty in walking, not elsewhere classified     Problem List Patient Active Problem List   Diagnosis Date Noted  . OA (osteoarthritis) of knee 11/04/2016  . COPD (chronic obstructive pulmonary disease) ([place]) 12/04/2015  . Ovarian mass, left 10/04/2015  . Osteoporosis 09/05/2015  . Skin lesion 08/01/2015  . Chronic infection of sinus 06/30/2015  . H/O varicella 06/30/2015  . Lung nodule, solitary 06/30/2015  . Allergic rhinitis 05/26/2015  . Bilateral cataracts 05/26/2015  . 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 Hypercholesteremia 05/26/2015  . Hypothyroidism 05/26/2015  . Cramps of lower extremity 05/26/2015  . Hemorrhoids, internal 05/26/2015  . Cervical pain 05/26/2015  . 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 Acquired trigger finger 05/26/2015  . Diverticulitis 05/26/2015    Aldona Lento 12/14/2016, 10:11 AM  [place] [facility] [address] [place], [address] Phone: [phone]   Fax:  [phone]  Name: [name] [name] MRN: [id number] Date of Birth: 1940-12-17

## 2016-12-17 ENCOUNTER — Encounter: Payer: Self-pay | Admitting: Physical Therapy

## 2016-12-17 ENCOUNTER — Ambulatory Visit: Payer: PPO | Admitting: Physical Therapy

## 2016-12-17 DIAGNOSIS — M25662 Stiffness of left knee, not elsewhere classified: Secondary | ICD-10-CM | POA: Diagnosis not present

## 2016-12-17 DIAGNOSIS — R262 Difficulty in walking, not elsewhere classified: Secondary | ICD-10-CM

## 2016-12-17 DIAGNOSIS — M25562 Pain in left knee: Secondary | ICD-10-CM

## 2016-12-17 DIAGNOSIS — M6281 Muscle weakness (generalized): Secondary | ICD-10-CM

## 2016-12-18 NOTE — Therapy (Signed)
[facility] [address] [place], [address] Phone: [phone]   Fax:  [phone]  Physical Therapy Treatment  Patient Details  Name: [name] [name] MRN: [id number] Date of Birth: Mar 14, 1941 Referring Provider: Gaynelle Arabian MD; Gerrit Halls [place]  Encounter Date: 12/17/2016      PT End of Session - 12/17/16 1200    Visit Number 10   Number of Visits 12   Date for PT Re-Evaluation 12/19/16   Authorization Type 10   Authorization Time Period 10 (G-code)   PT Start Time 1120   PT Stop Time 1203   PT Time Calculation (min) 43 min   Activity Tolerance Patient tolerated treatment well   Behavior During Therapy [facility] for tasks assessed/performed      Past Medical History:  Diagnosis Date  . Allergy   . Allergy    allergy to spandex "had a bad reaction of blisters when using after a surgery"  . 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 Complication of anesthesia    reports history of "trouble waking up after surgery"  . COPD (chronic obstructive pulmonary disease) ([place])   . Dysrhythmia    frequent PVC's  . Family history of adverse reaction to anesthesia    mother with history of malignant hypertension  . GERD (gastroesophageal reflux disease)   . Hyperlipidemia   . Hypothyroidism   . Peripheral vascular disease ([place]) 1980   pulmonary embolism  . Pneumonia   . PVC (premature ventricular contraction)   . Shortness of breath dyspnea    with extertion  . Thyroid disease     Past Surgical History:  Procedure Laterality Date  . abdominal surgery    . APPENDECTOMY    . BLEPHAROPLASTY Bilateral 11/2014   left eyelid 08/03/2015 Dr. Vickki Muff  . BREAST CYST ASPIRATION Left   . CHOLECYSTECTOMY  03/2004  . [place]   fertility testing  . EYE SURGERY Bilateral 2015   cataract excision  . KNEE SURGERY Right    knee arthroscopy-2000; Total knee  replacement-2002   . LAPAROTOMY  1969   fertility testing  . patelectomy Right   . Removal of medical meniscus  1972  . TOTAL KNEE ARTHROPLASTY Left 11/04/2016   Procedure: LEFT TOTAL KNEE ARTHROPLASTY;  Surgeon: Gaynelle Arabian, MD;  Location: [facility];  Service: Orthopedics;  Laterality: Left;  requests 83mins    There were no vitals filed for this visit.      Subjective Assessment - 12/17/16 1125    Subjective Patient reports she is seeing improvement with left knee flexibility and strength for improvement with walking. she continues with pain and limited ROM into flexion/extension.    Limitations Standing;Walking;House hold activities;Other (comment);Sitting   Patient Stated Goals walk wihtout assistance, get back to hiking and normal activity    Currently in Pain? Yes   Pain Score 2    Pain Location Knee   Pain Orientation Left   Pain Descriptors / Indicators Aching;Tightness;Sore   Pain Type Surgical pain  11/04/2016   Pain Onset More than a month ago   Pain Frequency Intermittent        Objective:  AROM: left knee flexion pre treatment 0-105 sitting at edge of treatment table, 0-110 post treatment Gait: ambulating with SPC with good cadence, reports back of knee is still hurting Palpation: decreased soft tissue mobility along lateral quadriceps and hamstring left LE  Treatment: Therapeutic Exercise: patient performed with verbal,tactile cues, demonstration and assistance of therapist: goal: improve strength, ROM, gait, function Sitting; Multi angle isometric knee flexion left LE with manual resistance of therapist 30/60/90+ degrees 3 sets Instructed in use of ball behind heel for self isometric hold at end ranges Sit to stand off treatment table at elevated height (23") with ball between knees and no UE support: able to perform 1 rep with reported increased lateral knee pain left therefore discontinued that exercise    Manual therapy: STM performed to left LE quadriceps,  lateral hamstring and calf muscles with patient seated, superficial techniques x 10 min.; goal improve ROM, decrease pain Modalities Electrical stimulation x 30min.: Russian stim. 10/10 cycle applied (2) electrodes to quadriceps/VMO left LE with patient reclined with LE supported on pillow; pt. Performing quad sets/knee extension with each cycle; goal muscle re education High volt estim.clincial program for muscle spasms (2) electrodes applied to lateral quadricepsintensity to tolerance with patient in reclined position with LE supported on pillow with moist heat applied to quadriceps/left hip region(no adverse reactions noted); goal: pain, spasms   Patient response to treatment: Patient demonstrated improved soft tissue elasticity with mild to no tenderness reported following STM. Patient performed exercises with good technique with minimal cuing, increased tenderness and pain with sit to stand off treatment table. Improved quad control, setting following estim. And no increased pain reported at end of session.         PT Education - 12/17/16 1210    Education provided Yes   Education Details HEP; ROM, strengthening left LE    Person(s) Educated Patient   Methods Explanation;Demonstration;Verbal cues   Comprehension Verbalized understanding;Returned demonstration;Verbal cues required             PT Long Term Goals - 12/09/16 1730      PT LONG TERM GOAL #1   Title Patient will demonstrate improvement with daily tasks with decreased left knee pain as indicated by LEFS score of 40/80 by 12/19/2016   Baseline LEFS 18/80 (80 = no self perceived disability); 26/80 (12/09/2016)   Status On-going     PT LONG TERM GOAL #2   Title patient will demonstrate improved functional community ambulation with least restrictive AD with 10MW of 10 seconds or less by 12/19/2016   Baseline 10 MW to be assessed; currently walking with rolling walker, slow cadence   Status On-going     PT LONG TERM  GOAL #3   Title Patient will be independent with home program for pain control and progressive exercise for self management by discharge from physical therapy 12/19/2016   Baseline limited knowledge and requires moderate cuing for pain control, exercises and progression   Status On-going               Plan - 12/17/16 1206    Clinical Impression Statement Patient demonstrates improvement in flexibility and strength in left LE as indicated by improved ability to walk with increased cadence, decreased left knee pain. She continues with limited ROM and strength and will benefit from continued physical therapy in order to transition to independent home program.    Rehab Potential Good   Clinical Impairments Affecting Rehab Potential (+)acute condition, motivated, prior level of function, family support    PT Frequency 3x / week   PT Duration 4 weeks   PT Treatment/Interventions Cryotherapy;Moist Heat;Gait training;Stair training;Patient/family education;Neuromuscular re-education;Therapeutic exercise;Balance training;Therapeutic activities;Manual techniques   PT Next Visit Plan ROM, strengthening, manual therapy STM, patella mobilization, electrical  stimulation for muscle re education   PT Home Exercise Plan continue with home program as given; add AAROM with ball under foot, ball squeeze between knees with glute sets, SLR sitting, standing controlled hip abduction/extension      Patient will benefit from skilled therapeutic intervention in order to improve the following deficits and impairments:  Decreased strength, Pain, Impaired perceived functional ability, Decreased activity tolerance, Decreased endurance, Difficulty walking, Decreased range of motion, Cardiopulmonary status limiting activity  Visit Diagnosis: Acute pain of left knee  Muscle weakness (generalized)  Stiffness of left knee, not elsewhere classified  Difficulty in walking, not elsewhere classified     Problem  List Patient Active Problem List   Diagnosis Date Noted  . OA (osteoarthritis) of knee 11/04/2016  . COPD (chronic obstructive pulmonary disease) ([place]) 12/04/2015  . Ovarian mass, left 10/04/2015  . Osteoporosis 09/05/2015  . Skin lesion 08/01/2015  . Chronic infection of sinus 06/30/2015  . H/O varicella 06/30/2015  . Lung nodule, solitary 06/30/2015  . Allergic rhinitis 05/26/2015  . Bilateral cataracts 05/26/2015  . 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 Hypercholesteremia 05/26/2015  . Hypothyroidism 05/26/2015  . Cramps of lower extremity 05/26/2015  . Hemorrhoids, internal 05/26/2015  . Cervical pain 05/26/2015  . 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 Acquired trigger finger 05/26/2015  . Diverticulitis 05/26/2015    Jomarie Longs PT 12/18/2016, 2:57 PM  [facility] [address] [place], [address] Phone: [phone]   Fax:  [phone]  Name: [name] [name] MRN: [id number] Date of Birth: [birth year]

## 2016-12-19 ENCOUNTER — Encounter: Payer: Self-pay | Admitting: Physical Therapy

## 2016-12-19 ENCOUNTER — Ambulatory Visit: Payer: PPO | Admitting: Physical Therapy

## 2016-12-19 DIAGNOSIS — M25662 Stiffness of left knee, not elsewhere classified: Secondary | ICD-10-CM | POA: Diagnosis not present

## 2016-12-19 DIAGNOSIS — R262 Difficulty in walking, not elsewhere classified: Secondary | ICD-10-CM

## 2016-12-19 DIAGNOSIS — M6281 Muscle weakness (generalized): Secondary | ICD-10-CM

## 2016-12-19 NOTE — Therapy (Signed)
[facility] [address] [place], [address] Phone: [phone]   Fax:  [phone]  Physical Therapy Treatment  Patient Details  Name: [name] [name] MRN: [id number] Date of Birth: [birth year] Referring Provider: Gaynelle Arabian MD; Gerrit Halls [place]  Encounter Date: 12/19/2016      PT End of Session - 12/19/16 1142    Visit Number 11   Number of Visits 14   Date for PT Re-Evaluation 12/27/16   Authorization Type 11   Authorization Time Period 20 (G-code)   PT Start Time 1034   PT Stop Time 1130   PT Time Calculation (min) 56 min   Activity Tolerance Patient tolerated treatment well   Behavior During Therapy [facility] for tasks assessed/performed      Past Medical History:  Diagnosis Date  . Allergy   . Allergy    allergy to spandex "had a bad reaction of blisters when using after a surgery"  . 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 Complication of anesthesia    reports history of "trouble waking up after surgery"  . COPD (chronic obstructive pulmonary disease) ([place])   . Dysrhythmia    frequent PVC's  . Family history of adverse reaction to anesthesia    mother with history of malignant hypertension  . GERD (gastroesophageal reflux disease)   . Hyperlipidemia   . Hypothyroidism   . Peripheral vascular disease ([place]) 1980   pulmonary embolism  . Pneumonia   . PVC (premature ventricular contraction)   . Shortness of breath dyspnea    with extertion  . Thyroid disease     Past Surgical History:  Procedure Laterality Date  . abdominal surgery    . APPENDECTOMY    . BLEPHAROPLASTY Bilateral 11/2014   left eyelid 08/03/2015 Dr. Vickki Muff  . BREAST CYST ASPIRATION Left   . CHOLECYSTECTOMY  03/2004  . [place]   fertility testing  . EYE SURGERY Bilateral 2015   cataract excision  . KNEE SURGERY Right    knee arthroscopy-2000; Total knee  replacement-2002   . LAPAROTOMY  1969   fertility testing  . patelectomy Right   . Removal of medical meniscus  1972  . TOTAL KNEE ARTHROPLASTY Left 11/04/2016   Procedure: LEFT TOTAL KNEE ARTHROPLASTY;  Surgeon: Gaynelle Arabian, MD;  Location: [facility];  Service: Orthopedics;  Laterality: Left;  requests 16mins    There were no vitals filed for this visit.      Subjective Assessment - 12/19/16 1043    Subjective Patient reports increased pain, soreness in left knee following sit to stand exercise the other day. Improving today.  She is self managing stiffness in hamstring muscle by positioning foot a certain way.    Limitations Standing;Walking;House hold activities;Other (comment);Sitting   Patient Stated Goals walk wihtout assistance, get back to hiking and normal activity    Currently in Pain? Yes   Pain Score 2    Pain Orientation Left   Pain Descriptors / Indicators Aching;Tightness   Pain Type Surgical pain  11/04/2017   Pain Onset More than a month ago   Pain Frequency Intermittent      Objective:  AROM: left knee flexion pre treatment 5-105 sitting at edge of treatment table, 5-110 post treatment Gait: ambulating with SPC with good cadence, reports back of knee is still hurting Palpation: decreased soft tissue mobility along lateral quadriceps and hamstring left  LE,improving from previous session Outcome measures: 10MW 12 seconds, 10.7 seconds (WFL for community ambulator) TUG 13.8 seconds, 11.7 seconds (low fall risk)  Treatment: Therapeutic Exercise: patient performed with verbal,tactile cues, demonstration and assistance of therapist: goal: improve strength, ROM, gait, function Sitting; Therapeutic exercise: patient performed exercises with verbal, tactile cues and demonstration of therapist: Sitting: Knee extension with   2 #, 3# x 15 reps each Knee flexion with resistive band x 15 reps Standing: Side stepping along airex balance beam x 2 min. Step ups 4" step 10  reps leading with each LE Running man on floor performed 10 reps each LE NuStep level #3 for endurance; independent; unbilled time up to 8 min.  Manual therapy: STM performed to left LE quadriceps, lateral hamstring and calf muscles with patient seated, superficial techniques x 45min.; goal improve ROM, decrease pain   Patient response to treatment: patient demonstrated improved technique with exercises with minimal VC for correct alignment. Patient with decreased pain from   /10 to  /10. Patient with decreased spasms by    following STM. Improved motor control with repetition and cuing, following estim.        PT Education - 12/19/16 1125    Education provided Yes   Education Details HEP with advanced strengthening , stretching to include stairs for flexion, ER of hip in supine, hamstring stretches to improve knee extension   Person(s) Educated Patient   Methods Explanation;Demonstration;Verbal cues;Handout   Comprehension Verbalized understanding;Returned demonstration;Verbal cues required             PT Long Term Goals - 12/19/16 1133      PT LONG TERM GOAL #1   Title Patient will demonstrate improvement with daily tasks with decreased left knee pain as indicated by LEFS score of 40/80 by 12/27/2016   Baseline LEFS 18/80 (80 = no self perceived disability); 26/80 (12/09/2016)   Status Revised     PT LONG TERM GOAL #2   Title patient will demonstrate improved functional community ambulation with least restrictive AD with 10MW of 10 seconds or less by 12/27/2016   Baseline 10 MW to be assessed; currently walking with rolling walker, slow cadence; 12/19/2016 12 seconds with SPC   Status Revised     PT LONG TERM GOAL #3   Title Patient will be independent with home program for pain control and progressive exercise for self management by discharge from physical therapy 12/27/2016   Baseline limited knowledge and requires moderate cuing for pain control, exercises and progression;  12/19/16 progressing with strengthening and balance, requires further instruction   Status Revised               Plan - 12/19/16 1143    Clinical Impression Statement Patient is progressing well with goals for improving ROM left knee flexion 5-110 degrees, strength left quadriceps4/5, knee flexion 4-/5 and able to walk with less difficulty. She continues with stiffness and pain in left knee lateral aspect with improving soft tissue mobility in hamstring and calf muscles. She is now able to begin advancing exercises for home program and will require additional visits to be able to transition to a safe, appropriate home program.   Rehab Potential Good   Clinical Impairments Affecting Rehab Potential (+)acute condition, motivated, prior level of function, family support    PT Frequency 2x / week   PT Duration 2 weeks   PT Treatment/Interventions Cryotherapy;Moist Heat;Gait training;Stair training;Patient/family education;Neuromuscular re-education;Therapeutic exercise;Balance training;Therapeutic activities;Manual techniques   PT Next Visit Plan ROM,  strengthening, manual therapy STM, patella mobilization, electrical stimulation for muscle re education   PT Home Exercise Plan continue with home program as given; add AAROM with ball under foot, ball squeeze between knees with glute sets, SLR sitting, standing controlled hip abduction/extension   Consulted and Agree with Plan of Care Patient      Patient will benefit from skilled therapeutic intervention in order to improve the following deficits and impairments:  Decreased strength, Pain, Impaired perceived functional ability, Decreased activity tolerance, Decreased endurance, Difficulty walking, Decreased range of motion, Cardiopulmonary status limiting activity  Visit Diagnosis: Muscle weakness (generalized) - Plan: PT plan of care cert/re-cert  Stiffness of left knee, not elsewhere classified - Plan: PT plan of care  cert/re-cert  Difficulty in walking, not elsewhere classified - Plan: PT plan of care cert/re-cert     Problem List Patient Active Problem List   Diagnosis Date Noted  . OA (osteoarthritis) of knee 11/04/2016  . COPD (chronic obstructive pulmonary disease) ([place]) 12/04/2015  . Ovarian mass, left 10/04/2015  . Osteoporosis 09/05/2015  . Skin lesion 08/01/2015  . Chronic infection of sinus 06/30/2015  . H/O varicella 06/30/2015  . Lung nodule, solitary 06/30/2015  . Allergic rhinitis 05/26/2015  . Bilateral cataracts 05/26/2015  . 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 Hypercholesteremia 05/26/2015  . Hypothyroidism 05/26/2015  . Cramps of lower extremity 05/26/2015  . Hemorrhoids, internal 05/26/2015  . Cervical pain 05/26/2015  . 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 Acquired trigger finger 05/26/2015  . Diverticulitis 05/26/2015    Jomarie Longs PT 12/20/2016, 2:01 PM  [facility] [address], [place], [address] Phone: [phone]   Fax:  [phone]  Name: [name] [name] MRN: [id number] Date of Birth: [birth year]

## 2016-12-24 ENCOUNTER — Encounter: Payer: Self-pay | Admitting: Physical Therapy

## 2016-12-24 ENCOUNTER — Ambulatory Visit: Payer: PPO | Attending: Orthopedic Surgery | Admitting: Physical Therapy

## 2016-12-24 DIAGNOSIS — M6281 Muscle weakness (generalized): Secondary | ICD-10-CM | POA: Diagnosis not present

## 2016-12-24 DIAGNOSIS — M25662 Stiffness of left knee, not elsewhere classified: Secondary | ICD-10-CM | POA: Insufficient documentation

## 2016-12-24 DIAGNOSIS — R262 Difficulty in walking, not elsewhere classified: Secondary | ICD-10-CM | POA: Diagnosis not present

## 2016-12-24 NOTE — Therapy (Signed)
[facility] [address] [place], [address] Phone: [phone]   Fax:  [phone]  Physical Therapy Treatment  Patient Details  Name: [name] [name] MRN: [id number] Date of Birth: [birth year] Referring Provider: Gaynelle Arabian MD; Gerrit Halls [place]  Encounter Date: 12/24/2016      PT End of Session - 12/24/16 1200    Visit Number 12   Number of Visits 14   Date for PT Re-Evaluation 12/27/16   Authorization Type 12   Authorization Time Period 20 (G-code)   PT Start Time 1124   PT Stop Time 1210   PT Time Calculation (min) 46 min   Activity Tolerance Patient tolerated treatment well   Behavior During Therapy [facility] for tasks assessed/performed      Past Medical History:  Diagnosis Date  . Allergy   . Allergy    allergy to spandex "had a bad reaction of blisters when using after a surgery"  . 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 Complication of anesthesia    reports history of "trouble waking up after surgery"  . COPD (chronic obstructive pulmonary disease) ([place])   . Dysrhythmia    frequent PVC's  . Family history of adverse reaction to anesthesia    mother with history of malignant hypertension  . GERD (gastroesophageal reflux disease)   . Hyperlipidemia   . Hypothyroidism   . Peripheral vascular disease ([place]) 1980   pulmonary embolism  . Pneumonia   . PVC (premature ventricular contraction)   . Shortness of breath dyspnea    with extertion  . Thyroid disease     Past Surgical History:  Procedure Laterality Date  . abdominal surgery    . APPENDECTOMY    . BLEPHAROPLASTY Bilateral 11/2014   left eyelid 08/03/2015 Dr. Vickki Muff  . BREAST CYST ASPIRATION Left   . CHOLECYSTECTOMY  03/2004  . [place]   fertility testing  . EYE SURGERY Bilateral 2015   cataract excision  . KNEE SURGERY Right    knee arthroscopy-2000; Total knee  replacement-2002   . LAPAROTOMY  1969   fertility testing  . patelectomy Right   . Removal of medical meniscus  1972  . TOTAL KNEE ARTHROPLASTY Left 11/04/2016   Procedure: LEFT TOTAL KNEE ARTHROPLASTY;  Surgeon: Gaynelle Arabian, MD;  Location: [facility];  Service: Orthopedics;  Laterality: Left;  requests 74mins    There were no vitals filed for this visit.      Subjective Assessment - 12/24/16 1125    Subjective Patient reports she is doing well with left knee. She is sleeping better and keeps a pillow between her knees and is able to sleep well.    Limitations Standing;Walking;House hold activities;Other (comment);Sitting   Patient Stated Goals walk wihtout assistance, get back to hiking and normal activity    Currently in Pain? Yes   Pain Score 1    Pain Location Knee   Pain Orientation Left   Pain Descriptors / Indicators Discomfort   Pain Type Surgical pain  11/04/2016   Pain Onset More than a month ago   Pain Frequency Intermittent      Objective:  AROM: left knee flexion pre treatment 5-108 sitting at edge of treatment table, 5-110 post treatment Gait: ambulating with SPC with slow cadence, decreased hip/knee flexion intermittently  Treatment: Therapeutic Exercise: patient performed with verbal,tactile cues, demonstration and assistance of therapist: goal: improve  strength, ROM, gait, function Sitting: Knee flexion with green resistive band x 15 reps Hip abduction bilaterally with green resisitve band x 15 reps Hip flexion against green resistive band x 15 reps each LE Standing: Side stepping along airex balance beam x 2 min. With green resistive band around thighs Walk up/down steps using SPC and VC/demonstration for correct sequence (4 steps x 2 sets) Step ups 4" step 10 reps leading with each LE (2" height)  Walk along balance stones x 5 reps (6 stones) using UE support on treadmill railing NuStep level #3 for endurance; independent; unbilled time up to 10  min.  Patient response to treatment: Patient demonstrated improved technique with exercises with minimal VC and demonstration for correct alignment. Improved ability to negotiate stairs with proper sequencing and positioning of feet with repetition. No increased pain reported with exercises.           PT Education - 12/24/16 1145    Education provided Yes   Education Details educated in walking up/down steps, step ups forward and step downs on low step for exercise, balance stone walking for balance   Person(s) Educated Patient   Methods Explanation;Demonstration;Verbal cues   Comprehension Verbalized understanding;Returned demonstration;Verbal cues required             PT Long Term Goals - 12/19/16 1133      PT LONG TERM GOAL #1   Title Patient will demonstrate improvement with daily tasks with decreased left knee pain as indicated by LEFS score of 40/80 by 12/27/2016   Baseline LEFS 18/80 (80 = no self perceived disability); 26/80 (12/09/2016)   Status Revised     PT LONG TERM GOAL #2   Title patient will demonstrate improved functional community ambulation with least restrictive AD with 10MW of 10 seconds or less by 12/27/2016   Baseline 10 MW to be assessed; currently walking with rolling walker, slow cadence; 12/19/2016 12 seconds with SPC   Status Revised     PT LONG TERM GOAL #3   Title Patient will be independent with home program for pain control and progressive exercise for self management by discharge from physical therapy 12/27/2016   Baseline limited knowledge and requires moderate cuing for pain control, exercises and progression; 12/19/16 progressing with strengthening and balance, requires further instruction   Status Revised               Plan - 12/24/16 1215    Clinical Impression Statement Patient is progressing well with goals with improving function with sleeping, walking and improveing ROM left knee . She conitnues with decreased strength and is walking  with SPC for balance and saftey. She should continue to improve with additinal physical therapy intervention.    Rehab Potential Good   Clinical Impairments Affecting Rehab Potential (+)acute condition, motivated, prior level of function, family support    PT Frequency 2x / week   PT Duration 2 weeks   PT Treatment/Interventions Cryotherapy;Moist Heat;Gait training;Stair training;Patient/family education;Neuromuscular re-education;Therapeutic exercise;Balance training;Therapeutic activities;Manual techniques   PT Next Visit Plan ROM, strengthening, manual therapy STM, patella mobilization, electrical stimulation for muscle re education   PT Home Exercise Plan continue with home program as given; add AAROM with ball under foot, ball squeeze between knees with glute sets, SLR sitting, standing controlled hip abduction/extension      Patient will benefit from skilled therapeutic intervention in order to improve the following deficits and impairments:  Decreased strength, Pain, Impaired perceived functional ability, Decreased activity tolerance, Decreased endurance, Difficulty  walking, Decreased range of motion, Cardiopulmonary status limiting activity  Visit Diagnosis: Muscle weakness (generalized)  Stiffness of left knee, not elsewhere classified  Difficulty in walking, not elsewhere classified     Problem List Patient Active Problem List   Diagnosis Date Noted  . OA (osteoarthritis) of knee 11/04/2016  . COPD (chronic obstructive pulmonary disease) ([place]) 12/04/2015  . Ovarian mass, left 10/04/2015  . Osteoporosis 09/05/2015  . Skin lesion 08/01/2015  . Chronic infection of sinus 06/30/2015  . H/O varicella 06/30/2015  . Lung nodule, solitary 06/30/2015  . Allergic rhinitis 05/26/2015  . Bilateral cataracts 05/26/2015  . 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 Hypercholesteremia 05/26/2015  . Hypothyroidism 05/26/2015  . Cramps of lower extremity 05/26/2015  . Hemorrhoids,  internal 05/26/2015  . Cervical pain 05/26/2015  . 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 Acquired trigger finger 05/26/2015  . Diverticulitis 05/26/2015    Jomarie Longs PT 12/24/2016, 2:49 PM  [facility] [address] [place], [address] Phone: [phone]   Fax:  [phone]  Name: [name] [name] MRN: [id number] Date of Birth: 1941/07/16

## 2016-12-26 ENCOUNTER — Encounter: Payer: PPO | Admitting: Physical Therapy

## 2016-12-26 ENCOUNTER — Ambulatory Visit: Payer: PPO | Admitting: Physical Therapy

## 2016-12-26 DIAGNOSIS — R262 Difficulty in walking, not elsewhere classified: Secondary | ICD-10-CM

## 2016-12-26 DIAGNOSIS — M25662 Stiffness of left knee, not elsewhere classified: Secondary | ICD-10-CM

## 2016-12-26 DIAGNOSIS — M6281 Muscle weakness (generalized): Secondary | ICD-10-CM

## 2016-12-27 ENCOUNTER — Encounter: Payer: PPO | Admitting: Physical Therapy

## 2016-12-27 NOTE — Therapy (Signed)
[facility] [address] [place], [address] Phone: [phone]   Fax:  [phone]  Physical Therapy Treatment/Discharge Summary  Patient Details  Name: [name] [name] MRN: [id number] Date of Birth: [birth year] Referring Provider: Gaynelle Arabian MD; Gerrit Halls [place]  Encounter Date: 12/26/2016   Patient began phyiscal therapy on 11/21/2016 and has had 13 sessions with patient independent with home program and self management and partially met to achieving all goals. She should continue to improve with HEP/self management.       PT End of Session - 12/26/16 1630    Visit Number 13   Number of Visits 14   Date for PT Re-Evaluation 12/27/16   Authorization Type 13   Authorization Time Period 20 (G-code)   PT Start Time 1531   PT Stop Time 1620   PT Time Calculation (min) 49 min   Activity Tolerance Patient tolerated treatment well   Behavior During Therapy WFL for tasks assessed/performed      Past Medical History:  Diagnosis Date  . Allergy   . Allergy    allergy to spandex "had a bad reaction of blisters when using after a surgery"  . 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 Complication of anesthesia    reports history of "trouble waking up after surgery"  . COPD (chronic obstructive pulmonary disease) ([place])   . Dysrhythmia    frequent PVC's  . Family history of adverse reaction to anesthesia    mother with history of malignant hypertension  . GERD (gastroesophageal reflux disease)   . Hyperlipidemia   . Hypothyroidism   . Peripheral vascular disease ([place]) 1980   pulmonary embolism  . Pneumonia   . PVC (premature ventricular contraction)   . Shortness of breath dyspnea    with extertion  . Thyroid disease     Past Surgical History:  Procedure Laterality Date  . abdominal surgery    . APPENDECTOMY    . BLEPHAROPLASTY Bilateral 11/2014   left eyelid 08/03/2015  Dr. Vickki Muff  . BREAST CYST ASPIRATION Left   . CHOLECYSTECTOMY  03/2004  . [place]   fertility testing  . EYE SURGERY Bilateral 2015   cataract excision  . KNEE SURGERY Right    knee arthroscopy-2000; Total knee replacement-2002   . LAPAROTOMY  1969   fertility testing  . patelectomy Right   . Removal of medical meniscus  1972  . TOTAL KNEE ARTHROPLASTY Left 11/04/2016   Procedure: LEFT TOTAL KNEE ARTHROPLASTY;  Surgeon: Gaynelle Arabian, MD;  Location: [facility];  Service: Orthopedics;  Laterality: Left;  requests 65mns    There were no vitals filed for this visit.      Subjective Assessment - 12/26/16 1531    Subjective Patient reports she is doing well and is asking about the new exercises introduced last session. She is feeling more comfortable with home exercises. She agrees to discharge from physical therapy at this time.    Limitations Standing;Walking;House hold activities;Other (comment);Sitting   Patient Stated Goals walk wihtout assistance, get back to hiking and normal activity    Currently in Pain? Yes   Pain Score 1    Pain Location Knee   Pain Orientation Left   Pain Descriptors / Indicators Discomfort   Pain Type Surgical pain  11/04/2016   Pain Onset More than a month ago   Pain Frequency Intermittent  Objective:  AROM: left knee flexion pre treatment 5-110 sitting at edge of treatment table Gait: ambulating with SPC with good cadence and gait pattern LEFS; 30/80 (80 = no self perceived disability) initially was 26/80 Outcome measures: 12/19/2016 10MW 12 seconds, 10.7 seconds (WFL for community ambulator) TUG 13.8 seconds, 11.7 seconds (low fall risk)  Treatment: Therapeutic Exercise: patient performed with verbal,tactile cues, demonstration and assistance of therapist: goal: improve strength, ROM, gait, function Sitting: Re assessed home exercises to perform:  Knee flexion with green resistive band x 15 reps Hip abduction  bilaterally with green resisitve band x 15 reps Hip flexion against green resistive band x 15 reps each LE Standing: Walk forward and backwards along counter for safety with feet apart, 2 min. TKE with green resistive band around left LE just above the knee: demonstrated by therapist, performed by patient with assistance of therapist x 15 reps  NuStep level #3 for endurance; independent; unbilled time up to 10 min.  Patient response to treatment: Patient demonstrated good understanding of home exercises and was able to perform exercises with minimal to no cuing for correct technique and alignment.        PT Education - 12/26/16 1620    Education provided Yes   Education Details re assessed home program of exercises and re demonstrated TKE in standing with resistive band    Person(s) Educated Patient   Methods Explanation;Demonstration;Verbal cues   Comprehension Verbalized understanding;Returned demonstration;Verbal cues required             PT Long Term Goals - 12/26/16 1631      PT LONG TERM GOAL #1   Title Patient will demonstrate improvement with daily tasks with decreased left knee pain as indicated by LEFS score of 40/80 by 12/27/2016   Baseline LEFS 18/80 (80 = no self perceived disability); 26/80 (12/09/2016) 12/26/2016 30/80   Status Partially met      PT LONG TERM GOAL #2   Title patient will demonstrate improved functional community ambulation with least restrictive AD with 10MW of 10 seconds or less by 12/27/2016   Baseline 10 MW to be assessed; currently walking with rolling walker, slow cadence; 12/19/2016 12 seconds with SPC   Status Partially Met     PT LONG TERM GOAL #3   Title Patient will be independent with home program for pain control and progressive exercise for self management by discharge from physical therapy 12/27/2016   Baseline limited knowledge and requires moderate cuing for pain control, exercises and progression; 12/19/16 progressing with strengthening  and balance, requires further instruction   Status Achieved               Plan - 12/26/16 1630    Clinical Impression Statement Patient demonstrates AROM left knee to 110 degrees flexion, decreasing swelling and warmth in left knee and improving soft tissue mobility which should continue to improve with self management and home exercises. She is ambulating safely with SPC and is intermittently able to walk without AD in the home. She is traveling to families home in [place] and will continue with independent home program.    Rehab Potential Good   Clinical Impairments Affecting Rehab Potential (+)acute condition, motivated, prior level of function, family support    PT Frequency 2x / week   PT Duration 2 weeks   PT Treatment/Interventions Cryotherapy;Moist Heat;Gait training;Stair training;Patient/family education;Neuromuscular re-education;Therapeutic exercise;Balance training;Therapeutic activities;Manual techniques   PT Next Visit Plan discharge from physical therapy at this time   PT  Home Exercise Plan continue with home program as given; add AAROM with ball under foot, ball squeeze between knees with glute sets, SLR sitting, standing controlled hip abduction/extension   Consulted and Agree with Plan of Care Patient      Patient will benefit from skilled therapeutic intervention in order to improve the following deficits and impairments:  Decreased strength, Pain, Impaired perceived functional ability, Decreased activity tolerance, Decreased endurance, Difficulty walking, Decreased range of motion, Cardiopulmonary status limiting activity  Visit Diagnosis: Muscle weakness (generalized)  Stiffness of left knee, not elsewhere classified  Difficulty in walking, not elsewhere classified       G-Codes - 01/22/17 1632    Functional Assessment Tool Used (Outpatient Only) LEFS, ROM, pain, strength deficits, clinical judgment   Functional Limitation Mobility: Walking and  moving around   Mobility: Walking and Moving Around Goal Status [phone]) At least 1 percent but less than 20 percent impaired, limited or restricted   Mobility: Walking and Moving Around Discharge Status [phone]) At least 20 percent but less than 40 percent impaired, limited or restricted      Problem List Patient Active Problem List   Diagnosis Date Noted  . OA (osteoarthritis) of knee 11/04/2016  . COPD (chronic obstructive pulmonary disease) ([place]) 12/04/2015  . Ovarian mass, left 10/04/2015  . Osteoporosis 09/05/2015  . Skin lesion 08/01/2015  . Chronic infection of sinus 06/30/2015  . H/O varicella 06/30/2015  . Lung nodule, solitary 06/30/2015  . Allergic rhinitis 05/26/2015  . Bilateral cataracts 05/26/2015  . 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 Hypercholesteremia 05/26/2015  . Hypothyroidism 05/26/2015  . Cramps of lower extremity 05/26/2015  . Hemorrhoids, internal 05/26/2015  . Cervical pain 05/26/2015  . 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 Acquired trigger finger 05/26/2015  . Diverticulitis 05/26/2015    Jomarie Longs PT 12/27/2016, 12:54 PM  [facility] [address] [place], [address] Phone: [phone]   Fax:  [phone]  Name: [name] [name] MRN: [id number] Date of Birth: [birth year]

## 2016-12-30 ENCOUNTER — Encounter: Payer: PPO | Admitting: Physical Therapy

## 2017-01-01 ENCOUNTER — Encounter: Payer: Self-pay | Admitting: Physician Assistant

## 2017-01-01 DIAGNOSIS — E039 Hypothyroidism, unspecified: Secondary | ICD-10-CM

## 2017-01-01 DIAGNOSIS — M1990 Unspecified osteoarthritis, unspecified site: Secondary | ICD-10-CM

## 2017-01-01 MED ORDER — MELOXICAM 7.5 MG PO TABS: 7.5000 mg | ORAL_TABLET | Freq: Every day | ORAL | 3 refills | Status: DC

## 2017-01-01 MED ORDER — LEVOTHYROXINE SODIUM 75 MCG PO TABS: 75.0000 ug | ORAL_TABLET | Freq: Every day | ORAL | 3 refills | Status: DC

## 2017-01-03 ENCOUNTER — Encounter: Payer: PPO | Admitting: Physical Therapy

## 2017-01-07 ENCOUNTER — Encounter: Payer: PPO | Admitting: Physical Therapy

## 2017-01-10 ENCOUNTER — Encounter: Payer: PPO | Admitting: Physical Therapy

## 2017-01-14 ENCOUNTER — Encounter: Payer: PPO | Admitting: Physical Therapy

## 2017-01-14 DIAGNOSIS — Z96651 Presence of right artificial knee joint: Secondary | ICD-10-CM | POA: Diagnosis not present

## 2017-01-16 ENCOUNTER — Encounter: Payer: PPO | Admitting: Physical Therapy

## 2017-04-14 ENCOUNTER — Encounter: Payer: Self-pay | Admitting: Physician Assistant

## 2017-05-08 ENCOUNTER — Ambulatory Visit (INDEPENDENT_AMBULATORY_CARE_PROVIDER_SITE_OTHER): Payer: PPO

## 2017-05-08 DIAGNOSIS — Z23 Encounter for immunization: Secondary | ICD-10-CM | POA: Diagnosis not present

## 2017-05-30 ENCOUNTER — Ambulatory Visit: Payer: Self-pay | Admitting: Orthopedic Surgery

## 2017-06-13 DIAGNOSIS — H26491 Other secondary cataract, right eye: Secondary | ICD-10-CM | POA: Diagnosis not present

## 2017-06-18 NOTE — Pre-Procedure Instructions (Addendum)
The following are in epic: EKG 11/06/16 CT chest 09/04/16 CXR 07/10/16

## 2017-06-18 NOTE — Patient Instructions (Addendum)
[name] [name]  06/18/2017   Your procedure is scheduled on: Wednesday, Oct. 31, 2018   Surgery time: 12:30PM-2:00 PM   Report to [facility]  Entrance   Surgery Time: 1230p-2:00pm    Report to admitting at 10:00 AM   Call this number if you have problems the morning of surgery [phone]   Remember: ONLY 1 PERSON MAY GO WITH YOU TO SHORT STAY TO GET  READY MORNING OF [name].   Do not eat food or drink liquids :After Midnight.     Take these medicines the morning of surgery with A SIP OF WATER: Flecainide (Tambocor), Levothyroxine (Synthroid), Use Asthma Inhaler per normal routine and bring day of surgery                                You may not have any metal on your body including hair pins and              piercings    Do not wear jewelry, make-up, lotions, powders or perfumes, deodorant             Do not wear nail polish.  Do not shave  48 hours prior to surgery.                Do not bring valuables to the hospital. [place].   Contacts, dentures or bridgework may not be worn into surgery.   Leave suitcase in the car. After surgery it may be brought to your room.              Please read over the following fact sheets you were given: _____________________________________________________________________             [facility] - Preparing for Surgery Before surgery, you can play an important role.  Because skin is not sterile, your skin needs to be as free of germs as possible.  You can reduce the number of germs on your skin by washing with CHG (chlorahexidine gluconate) soap before surgery.  CHG is an antiseptic cleaner which kills germs and bonds with the skin to continue killing germs even after washing. Please DO NOT use if you have an allergy to CHG or antibacterial soaps.  If your skin becomes reddened/irritated stop using the CHG and inform your nurse when you arrive at  Short Stay. Do not shave (including legs and underarms) for at least 48 hours prior to the first CHG shower.  You may shave your face/neck.  Please follow these instructions carefully:  1.  Shower with CHG Soap the night before surgery and the  morning of Surgery.  2.  If you choose to wash your hair, wash your hair first as usual with your  normal  shampoo.  3.  After you shampoo, rinse your hair and body thoroughly to remove the  shampoo.                             4.  Use CHG as you would any other liquid soap.  You can apply chg directly  to the skin and wash  Gently with a scrungie or clean washcloth.  5.  Apply the CHG Soap to your body ONLY FROM THE NECK DOWN.   Do not use on face/ open                           Wound or open sores. Avoid contact with eyes, ears mouth and genitals (private parts).                       Wash face,  Genitals (private parts) with your normal soap.             6.  Wash thoroughly, paying special attention to the area where your surgery  will be performed.  7.  Thoroughly rinse your body with warm water from the neck down.  8.  DO NOT shower/wash with your normal soap after using and rinsing off  the CHG Soap.                9.  Pat yourself dry with a clean towel.            10.  Wear clean pajamas.            11.  Place clean sheets on your bed the night of your first shower and do not  sleep with pets. Day of Surgery : Do not apply any lotions/deodorants the morning of surgery.  Please wear clean clothes to the hospital/surgery center.  FAILURE TO FOLLOW THESE INSTRUCTIONS MAY RESULT IN THE CANCELLATION OF YOUR SURGERY  PATIENT SIGNATURE_________________________________  NURSE SIGNATURE__________________________________  ________________________________________________________________________   [name]  An incentive spirometer is a tool that can help keep your lungs clear and active. This tool measures how well  you are filling your lungs with each breath. Taking long deep breaths may help reverse or decrease the chance of developing breathing (pulmonary) problems (especially infection) following:  A long period of time when you are unable to move or be active. BEFORE THE PROCEDURE   If the spirometer includes an indicator to show your best effort, your nurse or respiratory therapist will set it to a desired goal.  If possible, sit up straight or lean slightly forward. Try not to slouch.  Hold the incentive spirometer in an upright position. INSTRUCTIONS FOR USE  1. Sit on the edge of your bed if possible, or sit up as far as you can in bed or on a chair. 2. Hold the incentive spirometer in an upright position. 3. Breathe out normally. 4. Place the mouthpiece in your mouth and seal your lips tightly around it. 5. Breathe in slowly and as deeply as possible, raising the piston or the ball toward the top of the column. 6. Hold your breath for 3-5 seconds or for as long as possible. Allow the piston or ball to fall to the bottom of the column. 7. Remove the mouthpiece from your mouth and breathe out normally. 8. Rest for a few seconds and repeat Steps 1 through 7 at least 10 times every 1-2 hours when you are awake. Take your time and take a few normal breaths between deep breaths. 9. The spirometer may include an indicator to show your best effort. Use the indicator as a goal to work toward during each repetition. 10. After each set of 10 deep breaths, practice coughing to be sure your lungs are clear. If you have an incision (the cut made at the time of  surgery), support your incision when coughing by placing a pillow or rolled up towels firmly against it. Once you are able to get out of bed, walk around indoors and cough well. You may stop using the incentive spirometer when instructed by your caregiver.  RISKS AND COMPLICATIONS  Take your time so you do not get dizzy or light-headed.  If you are  in pain, you may need to take or ask for pain medication before doing incentive spirometry. It is harder to take a deep breath if you are having pain. AFTER USE  Rest and breathe slowly and easily.  It can be helpful to keep track of a log of your progress. Your caregiver can provide you with a simple table to help with this. If you are using the spirometer at home, follow these instructions: [place] IF:   You are having difficultly using the spirometer.  You have trouble using the spirometer as often as instructed.  Your pain medication is not giving enough relief while using the spirometer.  You develop fever of 100.5 F (38.1 C) or higher. SEEK IMMEDIATE MEDICAL CARE IF:   You cough up bloody sputum that had not been present before.  You develop fever of 102 F (38.9 C) or greater.  You develop worsening pain at or near the incision site. MAKE SURE YOU:   Understand these instructions.  Will watch your condition.  Will get help right away if you are not doing well or get worse. Document Released: 12/23/2006 Document Revised: 11/04/2011 Document Reviewed: 02/23/2007 ExitCare Patient Information 2014 ExitCare, [place].   ________________________________________________________________________  WHAT IS A BLOOD TRANSFUSION? Blood Transfusion Information  A transfusion is the replacement of blood or some of its parts. Blood is made up of multiple cells which provide different functions.  Red blood cells carry oxygen and are used for blood loss replacement.  White blood cells fight against infection.  Platelets control bleeding.  Plasma helps clot blood.  Other blood products are available for specialized needs, such as hemophilia or other clotting disorders. BEFORE THE TRANSFUSION  Who gives blood for transfusions?   Healthy volunteers who are fully evaluated to make sure their blood is safe. This is blood bank blood. Transfusion therapy is the safest it  has ever been in the practice of medicine. Before blood is taken from a donor, a complete history is taken to make sure that person has no history of diseases nor engages in risky social behavior (examples are intravenous drug use or sexual activity with multiple partners). The donor's travel history is screened to minimize risk of transmitting infections, such as malaria. The donated blood is tested for signs of infectious diseases, such as HIV and hepatitis. The blood is then tested to be sure it is compatible with you in order to minimize the chance of a transfusion reaction. If you or a relative donates blood, this is often done in anticipation of surgery and is not appropriate for emergency situations. It takes many days to process the donated blood. RISKS AND COMPLICATIONS Although transfusion therapy is very safe and saves many lives, the main dangers of transfusion include:   Getting an infectious disease.  Developing a transfusion reaction. This is an allergic reaction to something in the blood you were given. Every precaution is taken to prevent this. The decision to have a blood transfusion has been considered carefully by your caregiver before blood is given. Blood is not given unless the benefits outweigh the risks. AFTER THE  TRANSFUSION  Right after receiving a blood transfusion, you will usually feel much better and more energetic. This is especially true if your red blood cells have gotten low (anemic). The transfusion raises the level of the red blood cells which carry oxygen, and this usually causes an energy increase.  The nurse administering the transfusion will monitor you carefully for complications. HOME CARE INSTRUCTIONS  No special instructions are needed after a transfusion. You may find your energy is better. Speak with your caregiver about any limitations on activity for underlying diseases you may have. SEEK MEDICAL CARE IF:   Your condition is not improving after your  transfusion.  You develop redness or irritation at the intravenous (IV) site. SEEK IMMEDIATE MEDICAL CARE IF:  Any of the following symptoms occur over the next 12 hours:  Shaking chills.  You have a temperature by mouth above 102 F (38.9 C), not controlled by medicine.  Chest, back, or muscle pain.  People around you feel you are not acting correctly or are confused.  Shortness of breath or difficulty breathing.  Dizziness and fainting.  You get a rash or develop hives.  You have a decrease in urine output.  Your urine turns a dark color or changes to pink, red, or brown. Any of the following symptoms occur over the next 10 days:  You have a temperature by mouth above 102 F (38.9 C), not controlled by medicine.  Shortness of breath.  Weakness after normal activity.  The white part of the eye turns yellow (jaundice).  You have a decrease in the amount of urine or are urinating less often.  Your urine turns a dark color or changes to pink, red, or brown. Document Released: 08/09/2000 Document Revised: 11/04/2011 Document Reviewed: 03/28/2008 [facility] Patient Information 2014 [place], [place].  _______________________________________________________________________

## 2017-06-20 ENCOUNTER — Encounter (HOSPITAL_COMMUNITY)
Admission: RE | Admit: 2017-06-20 | Discharge: 2017-06-20 | Disposition: A | Discharge status: Home / Self Care | Payer: PPO | Source: Ambulatory Visit | Attending: Orthopedic Surgery | Admitting: Orthopedic Surgery

## 2017-06-20 ENCOUNTER — Encounter (HOSPITAL_COMMUNITY): Payer: Self-pay

## 2017-06-20 DIAGNOSIS — Z86711 Personal history of pulmonary embolism: Secondary | ICD-10-CM | POA: Insufficient documentation

## 2017-06-20 DIAGNOSIS — Z79899 Other long term (current) drug therapy: Secondary | ICD-10-CM | POA: Diagnosis not present

## 2017-06-20 DIAGNOSIS — Z01812 Encounter for preprocedural laboratory examination: Secondary | ICD-10-CM | POA: Insufficient documentation

## 2017-06-20 DIAGNOSIS — Z7901 Long term (current) use of anticoagulants: Secondary | ICD-10-CM | POA: Diagnosis not present

## 2017-06-20 DIAGNOSIS — I493 Ventricular premature depolarization: Secondary | ICD-10-CM | POA: Diagnosis not present

## 2017-06-20 DIAGNOSIS — K219 Gastro-esophageal reflux disease without esophagitis: Secondary | ICD-10-CM | POA: Diagnosis not present

## 2017-06-20 DIAGNOSIS — E7800 Pure hypercholesterolemia, unspecified: Secondary | ICD-10-CM | POA: Insufficient documentation

## 2017-06-20 DIAGNOSIS — M25562 Pain in left knee: Secondary | ICD-10-CM | POA: Insufficient documentation

## 2017-06-20 HISTORY — DX: Palpitations: R00.2

## 2017-06-20 HISTORY — DX: Other skin changes: R23.8

## 2017-06-20 HISTORY — DX: Spontaneous ecchymoses: R23.3

## 2017-06-20 HISTORY — DX: Other pulmonary embolism without acute cor pulmonale: I26.99

## 2017-06-20 LAB — COMPREHENSIVE METABOLIC PANEL
ALBUMIN: 4.1 g/dL (ref 3.5–5.0)
ALK PHOS: 61 U/L (ref 38–126)
ALT: 22 U/L (ref 14–54)
ANION GAP: 8 (ref 5–15)
AST: 24 U/L (ref 15–41)
BILIRUBIN TOTAL: 1.2 mg/dL (ref 0.3–1.2)
BUN: 16 mg/dL (ref 6–20)
CALCIUM: 9.7 mg/dL (ref 8.9–10.3)
CO2: 26 mmol/L (ref 22–32)
Chloride: 105 mmol/L (ref 101–111)
Creatinine, Ser: 0.62 mg/dL (ref 0.44–1.00)
GFR calc Af Amer: >60 mL/min (ref >60)
GLUCOSE: 99 mg/dL (ref 65–99)
Potassium: 5 mmol/L (ref 3.5–5.1)
Sodium: 139 mmol/L (ref 135–145)
TOTAL PROTEIN: 7 g/dL (ref 6.5–8.1)

## 2017-06-20 LAB — CBC
HEMATOCRIT: 45.1 % (ref 36.0–46.0)
HEMOGLOBIN: 15.2 g/dL — AB (ref 12.0–15.0)
MCH: 33 pg (ref 26.0–34.0)
MCHC: 33.7 g/dL (ref 30.0–36.0)
MCV: 98 fL (ref 78.0–100.0)
Platelets: 210 10*3/uL (ref 150–400)
RBC: 4.6 MIL/uL (ref 3.87–5.11)
RDW: 13 % (ref 11.5–15.5)
WBC: 5.8 10*3/uL (ref 4.0–10.5)

## 2017-06-20 LAB — APTT: aPTT: 28 seconds (ref 24–36)

## 2017-06-20 LAB — PROTIME-INR
INR: 0.9
PROTHROMBIN TIME: 12.1 s (ref 11.4–15.2)

## 2017-06-20 LAB — SURGICAL PCR SCREEN
MRSA, PCR: NEGATIVE
STAPHYLOCOCCUS AUREUS: NEGATIVE

## 2017-06-20 NOTE — Pre-Procedure Instructions (Signed)
Dr. Gifford Shave made aware that patient stated she believes her mother had history of Malignant Hyperthermia.

## 2017-06-23 ENCOUNTER — Other Ambulatory Visit: Payer: Self-pay | Admitting: Family Medicine

## 2017-06-24 ENCOUNTER — Ambulatory Visit: Payer: Self-pay | Admitting: Orthopedic Surgery

## 2017-06-24 NOTE — Progress Notes (Signed)
Spoke with patient and informed patient of time change for surgery at [facility]  on 06/25/2017. Patient instructed to arrive at Admitting at 0630 am on 06/25/2017 and nothing to eat or drink after midnight. Patient verbalized understanding.

## 2017-06-24 NOTE — H&P (Signed)
[name] [name] DOB: [birth year] Married / Language: English / Race: White Female Date of Admission:  06/25/2017 CC: Loose Right Total knee arthroplasty History of Present Illness The patient is a 76 year old female who comes in for a preoperative History and Physical. The patient is scheduled for a right polyethylene revison versus total knee revision to be performed by Dr. Dione Plover. Aluisio, MD at [facility] on 06-25-2017. The patient comes in several months out from left total knee arthroplasty. The patient states that she is doing very well at this time. The pain is under excellent control at this time and describe their pain as mild. They are currently on Tylenol (bid) for their pain. The patient is currently doing home exercise program. The patient feels that they are progressing well at this time. Her right knee has also been observed in the clinic. She had her right knee replaced by Dr. Donnajean Lopes about 15 years ago and now gets some discomfort with that. 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 The liner has worn down over time and now that she has rehab the left knee, she is ready to get the right knee revised at this time. They have been treated conservatively in the past for the above stated problem. It is felt that they would benefit from undergoing revision of the total joint replacement. Risks and benefits of the procedure have been discussed with the patient and they elect to proceed with surgery. There are no active contraindications to surgery such as ongoing infection or rapidly progressive neurological disease.  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 Codeine Sulfate *ANALGESICS - OPIOID*  Nausea, Vomiting. Morphine Sulfate *ANALGESICS - OPIOID*  Nausea, Vomiting.  Family History  Cancer  Sister. Cerebrovascular Accident  Maternal Grandmother, Mother, Paternal Grandmother. Chronic Obstructive Lung Disease  Sister. Diabetes Mellitus  Mother. Heart Disease  Father, Paternal Grandfather. Hypertension  Father, Maternal Grandmother, Mother, Paternal Grandmother. Osteoarthritis  Sister. Osteoporosis  Mother, Sister.  Social History Children  2 Current drinker  05/06/2016: Currently drinks wine and hard liquor less than 5 times per week Current work status  retired Furniture conservator/restorer daily; does running / walking Living situation  live with spouse Marital status  married No history of drug/alcohol rehab  Not under pain contract  Number of flights of stairs before winded  2-3 Tobacco / smoke exposure  05/06/2016: no Tobacco use  Former smoker. 05/06/2016: smoke(d) 3 more pack(s) per day Advance Directives  Living Will, Healthcare POA  Medication History  Advair Diskus (250-50MCG/DOSE Aero Pow Br Act, Inhalation) Active. Spiriva HandiHaler (18MCG Capsule, Inhalation) Active. Levothyroxine Sodium (75MCG Tablet, Oral) Active. Flecainide Acetate (50MG  Tablet, Oral) Active. Triamcinolone Acetonide (0.1% Cream, External) Active. (prn) Ventolin HFA (108 (90 Base)MCG/ACT Aerosol Soln, Inhalation) Active. Tylenol (Oral) Specific strength unknown - Active. Nasocort Active. Fexofenadine HCl (60MG  Tablet,  Oral) Active.  Meloxicam (7.5MG  Tablet, Oral) Active. Multivitamin Active.  Past Surgical History  Appendectomy  Date: 1962. Dilation and Curettage of Uterus  Date: 1968. Right Medial Meniscal Surgery  Date: 101. Total Knee Replacement  Date: 2002. right Gallbladder Surgery  Date: 2005. laporoscopic Cataract Surgery  bilateral, 2015 and 2016   Review of Systems  General Not Present- Chills, Fatigue, Fever, Memory Loss, Night Sweats, Weight Gain and Weight Loss. Skin Present- Bruising. Not Present- Eczema, Hives, Itching, Lesions and Rash. HEENT Present- Hearing problems. Not Present- Dentures, Double Vision, Headache, Hearing Loss, Tinnitus and Visual Loss. Respiratory Present- Allergies and Shortness of breath with exertion. Not Present- Chronic Cough, Coughing up blood and Shortness of breath at rest. Cardiovascular Present- Irregular Heart Beat. Not Present- Chest Pain, Difficulty Breathing Lying Down, Murmur, Palpitations, Racing/skipping heartbeats and Swelling. Gastrointestinal Present- Constipation. Not Present- Abdominal Pain, Bloody Stool, Diarrhea, Difficulty Swallowing, Heartburn, Jaundice, Loss of appetitie, Nausea and Vomiting. Female Genitourinary Present- Stress Incontinence. Not Present- Blood in Urine, Discharge, Flank Pain, Incontinence, Painful Urination, Urgency, Urinary frequency, Urinary Retention, Urinating at Night and Weak urinary stream. Musculoskeletal Present- Joint Pain and Joint Stiffness. Not Present- Back Pain, Joint Swelling, Morning Stiffness, Muscle Pain, Muscle Weakness and Spasms. Neurological Not Present- Blackout spells, Difficulty with balance, Dizziness, Paralysis, Tremor and Weakness. Psychiatric Not Present- Insomnia. Endocrine Present- Cold Intolerance. Hematology Present- Easy Bruising.  Vitals Weight: 130 lb Height: 62.5in Weight was reported by patient. Height was reported by patient. Body Surface Area: 1.6 m Body Mass  Index: 23.4 kg/m  Pulse: 64 (Regular)  BP: 138/64 (Sitting, Right Arm, Standard)   Physical Exam  General Mental Status -Alert, cooperative and good historian. General Appearance-pleasant, Not in acute distress. Orientation-Oriented X3. Build & Nutrition-Well nourished and Well developed.  Head and Neck Head-normocephalic, atraumatic . Neck Global Assessment - supple, no bruit auscultated on the right, no bruit auscultated on the left.  Eye Pupil - Bilateral-Regular and Round. Motion - Bilateral-EOMI.  Chest and Lung Exam Auscultation Breath sounds - clear at anterior chest wall and clear at posterior chest wall. Adventitious sounds - No Adventitious sounds.  Cardiovascular Auscultation Rhythm - Regular rate and rhythm. Heart Sounds - S1 WNL and S2 WNL. Murmurs & Other Heart Sounds - Auscultation of the heart reveals - No Murmurs.  Abdomen Palpation/Percussion Tenderness - Abdomen is non-tender to palpation. Rigidity (guarding) - Abdomen is soft. Auscultation Auscultation of the abdomen reveals - Bowel sounds normal.  Female Genitourinary Note: Not done, not pertinent to present illness   Musculoskeletal Note: Right knee range is about 0 to 105. No tenderness or instability.   Assessment & Plan  Status post left knee replacement (L79.892) Status post total right knee replacement (J19.417)  Note:Surgical Plans: Revison Right Total Knee  Disposition: Home, Straight to outpatient therapy  PCP: Nechama Guard, PA-C - Patient has been seen preoperatively and felt to be stable for surgery. Cards: Dr. Ubaldo Glassing - Patient has been seen preoperatively and felt to be stable for surgery.  Topical TXA - PE  Anesthesia Issues: None  Patient was instructed on what medications to stop prior to surgery.  Signed electronically by Joelene Millin, III PA-C

## 2017-06-25 ENCOUNTER — Encounter (HOSPITAL_COMMUNITY): Payer: Self-pay

## 2017-06-25 ENCOUNTER — Encounter (HOSPITAL_COMMUNITY)
Admission: RE | Disposition: A | Discharge status: Home / Self Care | Payer: Self-pay | Source: Ambulatory Visit | Attending: Orthopedic Surgery

## 2017-06-25 ENCOUNTER — Inpatient Hospital Stay (HOSPITAL_COMMUNITY): Payer: PPO | Admitting: Registered Nurse

## 2017-06-25 ENCOUNTER — Inpatient Hospital Stay (HOSPITAL_COMMUNITY)
Admission: RE | Admit: 2017-06-25 | Discharge: 2017-06-27 | DRG: 468 | Disposition: A | Discharge status: Home / Self Care | Payer: PPO | Source: Ambulatory Visit | Attending: Orthopedic Surgery | Admitting: Orthopedic Surgery

## 2017-06-25 DIAGNOSIS — T84032A Mechanical loosening of internal right knee prosthetic joint, initial encounter: Secondary | ICD-10-CM | POA: Diagnosis present

## 2017-06-25 DIAGNOSIS — Z86711 Personal history of pulmonary embolism: Secondary | ICD-10-CM | POA: Diagnosis not present

## 2017-06-25 DIAGNOSIS — T84062A Wear of articular bearing surface of internal prosthetic right knee joint, initial encounter: Principal | ICD-10-CM | POA: Diagnosis present

## 2017-06-25 DIAGNOSIS — Z79899 Other long term (current) drug therapy: Secondary | ICD-10-CM

## 2017-06-25 DIAGNOSIS — Z96652 Presence of left artificial knee joint: Secondary | ICD-10-CM | POA: Diagnosis not present

## 2017-06-25 DIAGNOSIS — Y792 Prosthetic and other implants, materials and accessory orthopedic devices associated with adverse incidents: Secondary | ICD-10-CM | POA: Diagnosis present

## 2017-06-25 DIAGNOSIS — Z91040 Latex allergy status: Secondary | ICD-10-CM | POA: Diagnosis not present

## 2017-06-25 DIAGNOSIS — Z87891 Personal history of nicotine dependence: Secondary | ICD-10-CM | POA: Diagnosis not present

## 2017-06-25 DIAGNOSIS — J440 Chronic obstructive pulmonary disease with acute lower respiratory infection: Secondary | ICD-10-CM | POA: Diagnosis not present

## 2017-06-25 DIAGNOSIS — Z96659 Presence of unspecified artificial knee joint: Secondary | ICD-10-CM

## 2017-06-25 DIAGNOSIS — M25361 Other instability, right knee: Secondary | ICD-10-CM | POA: Diagnosis not present

## 2017-06-25 DIAGNOSIS — K219 Gastro-esophageal reflux disease without esophagitis: Secondary | ICD-10-CM | POA: Diagnosis not present

## 2017-06-25 DIAGNOSIS — Z888 Allergy status to other drugs, medicaments and biological substances status: Secondary | ICD-10-CM

## 2017-06-25 DIAGNOSIS — E039 Hypothyroidism, unspecified: Secondary | ICD-10-CM | POA: Diagnosis not present

## 2017-06-25 DIAGNOSIS — G8918 Other acute postprocedural pain: Secondary | ICD-10-CM | POA: Diagnosis not present

## 2017-06-25 DIAGNOSIS — J449 Chronic obstructive pulmonary disease, unspecified: Secondary | ICD-10-CM | POA: Diagnosis not present

## 2017-06-25 DIAGNOSIS — Z7951 Long term (current) use of inhaled steroids: Secondary | ICD-10-CM | POA: Diagnosis not present

## 2017-06-25 DIAGNOSIS — Z885 Allergy status to narcotic agent status: Secondary | ICD-10-CM

## 2017-06-25 DIAGNOSIS — Z886 Allergy status to analgesic agent status: Secondary | ICD-10-CM | POA: Diagnosis not present

## 2017-06-25 DIAGNOSIS — M810 Age-related osteoporosis without current pathological fracture: Secondary | ICD-10-CM | POA: Diagnosis not present

## 2017-06-25 DIAGNOSIS — E7800 Pure hypercholesterolemia, unspecified: Secondary | ICD-10-CM | POA: Diagnosis not present

## 2017-06-25 DIAGNOSIS — I739 Peripheral vascular disease, unspecified: Secondary | ICD-10-CM | POA: Diagnosis present

## 2017-06-25 DIAGNOSIS — Z791 Long term (current) use of non-steroidal anti-inflammatories (NSAID): Secondary | ICD-10-CM | POA: Diagnosis not present

## 2017-06-25 DIAGNOSIS — T84022A Instability of internal right knee prosthesis, initial encounter: Secondary | ICD-10-CM | POA: Diagnosis present

## 2017-06-25 DIAGNOSIS — Z96651 Presence of right artificial knee joint: Secondary | ICD-10-CM | POA: Diagnosis not present

## 2017-06-25 DIAGNOSIS — T84092A Other mechanical complication of internal right knee prosthesis, initial encounter: Secondary | ICD-10-CM | POA: Diagnosis not present

## 2017-06-25 DIAGNOSIS — T84018A Broken internal joint prosthesis, other site, initial encounter: Secondary | ICD-10-CM

## 2017-06-25 HISTORY — PX: TOTAL KNEE REVISION: SHX996

## 2017-06-25 LAB — TYPE AND SCREEN
ABO/RH(D): A NEG
Antibody Screen: NEGATIVE

## 2017-06-25 SURGERY — TOTAL KNEE REVISION
Anesthesia: Spinal | Site: Knee | Laterality: Right

## 2017-06-25 MED ORDER — ACETAMINOPHEN 325 MG PO TABS
650.0000 mg | ORAL_TABLET | ORAL | Status: DC | PRN
  Administered 2017-06-26 – 2017-06-27 (×3): 650 mg via ORAL
  Filled 2017-06-25 (×3): qty 2

## 2017-06-25 MED ORDER — SODIUM CHLORIDE 0.9 % IJ SOLN
INTRAMUSCULAR | Status: AC
  Filled 2017-06-25: qty 50

## 2017-06-25 MED ORDER — ONDANSETRON HCL 4 MG/2ML IJ SOLN: 4.0000 mg | Freq: Four times a day (QID) | INTRAMUSCULAR | Status: DC | PRN

## 2017-06-25 MED ORDER — POLYETHYLENE GLYCOL 3350 17 G PO PACK
17.0000 g | PACK | Freq: Every day | ORAL | Status: DC | PRN
  Administered 2017-06-26: 17 g via ORAL
  Filled 2017-06-25: qty 1

## 2017-06-25 MED ORDER — ALBUTEROL SULFATE HFA 108 (90 BASE) MCG/ACT IN AERS: 1.0000 | INHALATION_SPRAY | RESPIRATORY_TRACT | Status: DC | PRN

## 2017-06-25 MED ORDER — GABAPENTIN 300 MG PO CAPS
300.0000 mg | ORAL_CAPSULE | Freq: Three times a day (TID) | ORAL | Status: DC
  Administered 2017-06-25 – 2017-06-27 (×6): 300 mg via ORAL
  Filled 2017-06-25 (×6): qty 1

## 2017-06-25 MED ORDER — SODIUM CHLORIDE 0.9 % IJ SOLN
INTRAMUSCULAR | Status: DC | PRN
  Administered 2017-06-25: 60 mL

## 2017-06-25 MED ORDER — SODIUM CHLORIDE 0.9 % IV SOLN
INTRAVENOUS | Status: DC
  Administered 2017-06-25 – 2017-06-26 (×3): via INTRAVENOUS

## 2017-06-25 MED ORDER — ONDANSETRON HCL 4 MG PO TABS: 4.0000 mg | ORAL_TABLET | Freq: Four times a day (QID) | ORAL | Status: DC | PRN

## 2017-06-25 MED ORDER — DEXAMETHASONE SODIUM PHOSPHATE 10 MG/ML IJ SOLN: 10.0000 mg | Freq: Once | INTRAMUSCULAR | Status: DC

## 2017-06-25 MED ORDER — RIVAROXABAN 10 MG PO TABS
10.0000 mg | ORAL_TABLET | Freq: Every day | ORAL | Status: DC
  Administered 2017-06-26 – 2017-06-27 (×2): 10 mg via ORAL
  Filled 2017-06-25 (×2): qty 1

## 2017-06-25 MED ORDER — METOCLOPRAMIDE HCL 5 MG PO TABS: 5.0000 mg | ORAL_TABLET | Freq: Three times a day (TID) | ORAL | Status: DC | PRN

## 2017-06-25 MED ORDER — METHOCARBAMOL 500 MG PO TABS
500.0000 mg | ORAL_TABLET | Freq: Four times a day (QID) | ORAL | Status: DC | PRN
  Administered 2017-06-26 – 2017-06-27 (×5): 500 mg via ORAL
  Filled 2017-06-25 (×5): qty 1

## 2017-06-25 MED ORDER — OXYCODONE HCL 5 MG PO TABS: 5.0000 mg | ORAL_TABLET | Freq: Once | ORAL | Status: DC | PRN

## 2017-06-25 MED ORDER — LIDOCAINE 2% (20 MG/ML) 5 ML SYRINGE
INTRAMUSCULAR | Status: AC
  Filled 2017-06-25: qty 5

## 2017-06-25 MED ORDER — METOCLOPRAMIDE HCL 5 MG/ML IJ SOLN: 5.0000 mg | Freq: Three times a day (TID) | INTRAMUSCULAR | Status: DC | PRN

## 2017-06-25 MED ORDER — TIOTROPIUM BROMIDE MONOHYDRATE 18 MCG IN CAPS
1.0000 | ORAL_CAPSULE | Freq: Every day | RESPIRATORY_TRACT | Status: DC
  Administered 2017-06-26 – 2017-06-27 (×2): 18 ug via RESPIRATORY_TRACT
  Filled 2017-06-25: qty 5

## 2017-06-25 MED ORDER — MIDAZOLAM HCL 2 MG/2ML IJ SOLN
INTRAMUSCULAR | Status: AC
  Filled 2017-06-25: qty 2

## 2017-06-25 MED ORDER — TRANEXAMIC ACID 1000 MG/10ML IV SOLN
1000.0000 mg | Freq: Once | INTRAVENOUS | Status: AC
  Administered 2017-06-25: 1000 mg via INTRAVENOUS
  Filled 2017-06-25: qty 1100

## 2017-06-25 MED ORDER — BISACODYL 10 MG RE SUPP: 10.0000 mg | Freq: Every day | RECTAL | Status: DC | PRN

## 2017-06-25 MED ORDER — ACETAMINOPHEN 10 MG/ML IV SOLN
1000.0000 mg | Freq: Once | INTRAVENOUS | Status: AC
  Administered 2017-06-25: 1000 mg via INTRAVENOUS

## 2017-06-25 MED ORDER — MOMETASONE FURO-FORMOTEROL FUM 200-5 MCG/ACT IN AERO
2.0000 | INHALATION_SPRAY | Freq: Two times a day (BID) | RESPIRATORY_TRACT | Status: DC
  Filled 2017-06-25: qty 8.8

## 2017-06-25 MED ORDER — EPHEDRINE SULFATE-NACL 50-0.9 MG/10ML-% IV SOSY
PREFILLED_SYRINGE | INTRAVENOUS | Status: DC | PRN
  Administered 2017-06-25 (×2): 5 mg via INTRAVENOUS

## 2017-06-25 MED ORDER — FLECAINIDE ACETATE 50 MG PO TABS
50.0000 mg | ORAL_TABLET | Freq: Two times a day (BID) | ORAL | Status: DC
  Administered 2017-06-25 – 2017-06-27 (×4): 50 mg via ORAL
  Filled 2017-06-25 (×4): qty 1

## 2017-06-25 MED ORDER — DEXAMETHASONE SODIUM PHOSPHATE 10 MG/ML IJ SOLN
10.0000 mg | Freq: Once | INTRAMUSCULAR | Status: AC
  Administered 2017-06-26: 08:00:00 10 mg via INTRAVENOUS
  Filled 2017-06-25: qty 1

## 2017-06-25 MED ORDER — DEXAMETHASONE SODIUM PHOSPHATE 10 MG/ML IJ SOLN
INTRAMUSCULAR | Status: AC
  Filled 2017-06-25: qty 1

## 2017-06-25 MED ORDER — ACETAMINOPHEN 650 MG RE SUPP: 650.0000 mg | RECTAL | Status: DC | PRN

## 2017-06-25 MED ORDER — HYDROMORPHONE HCL 1 MG/ML IJ SOLN
0.5000 mg | INTRAMUSCULAR | Status: DC | PRN
  Filled 2017-06-25: qty 0.5

## 2017-06-25 MED ORDER — PROPOFOL 500 MG/50ML IV EMUL
INTRAVENOUS | Status: DC | PRN
  Administered 2017-06-25: 25 ug/kg/min via INTRAVENOUS

## 2017-06-25 MED ORDER — ONDANSETRON HCL 4 MG/2ML IJ SOLN
INTRAMUSCULAR | Status: AC
  Filled 2017-06-25: qty 2

## 2017-06-25 MED ORDER — HYDROMORPHONE HCL 1 MG/ML IJ SOLN
0.2500 mg | INTRAMUSCULAR | Status: DC | PRN
Start: 2017-06-25 — End: 2017-06-25
  Administered 2017-06-25 (×2): 0.5 mg via INTRAVENOUS

## 2017-06-25 MED ORDER — ALBUTEROL SULFATE (2.5 MG/3ML) 0.083% IN NEBU: 2.5000 mg | INHALATION_SOLUTION | RESPIRATORY_TRACT | Status: DC | PRN

## 2017-06-25 MED ORDER — BUPIVACAINE LIPOSOME 1.3 % IJ SUSP
INTRAMUSCULAR | Status: DC | PRN
  Administered 2017-06-25: 20 mL

## 2017-06-25 MED ORDER — GABAPENTIN 300 MG PO CAPS
300.0000 mg | ORAL_CAPSULE | Freq: Once | ORAL | Status: DC
  Administered 2017-06-25: 300 mg via ORAL

## 2017-06-25 MED ORDER — ACETAMINOPHEN 500 MG PO TABS
1000.0000 mg | ORAL_TABLET | Freq: Four times a day (QID) | ORAL | Status: AC
  Administered 2017-06-25 – 2017-06-26 (×4): 1000 mg via ORAL
  Filled 2017-06-25 (×4): qty 2

## 2017-06-25 MED ORDER — HYDROMORPHONE HCL 1 MG/ML IJ SOLN
INTRAMUSCULAR | Status: AC
  Filled 2017-06-25: qty 2

## 2017-06-25 MED ORDER — FENTANYL CITRATE (PF) 100 MCG/2ML IJ SOLN
INTRAMUSCULAR | Status: DC | PRN
  Administered 2017-06-25: 50 ug via INTRAVENOUS

## 2017-06-25 MED ORDER — PROPOFOL 10 MG/ML IV BOLUS
INTRAVENOUS | Status: AC
  Filled 2017-06-25: qty 40

## 2017-06-25 MED ORDER — GABAPENTIN 300 MG PO CAPS
ORAL_CAPSULE | ORAL | Status: AC
  Filled 2017-06-25: qty 1

## 2017-06-25 MED ORDER — LORATADINE 10 MG PO TABS: 10.0000 mg | ORAL_TABLET | Freq: Every day | ORAL | Status: DC | PRN

## 2017-06-25 MED ORDER — BUPIVACAINE LIPOSOME 1.3 % IJ SUSP
20.0000 mL | Freq: Once | INTRAMUSCULAR | Status: DC
  Filled 2017-06-25: qty 20

## 2017-06-25 MED ORDER — ONDANSETRON HCL 4 MG/2ML IJ SOLN
INTRAMUSCULAR | Status: DC | PRN
  Administered 2017-06-25: 4 mg via INTRAVENOUS

## 2017-06-25 MED ORDER — METHOCARBAMOL 1000 MG/10ML IJ SOLN
500.0000 mg | Freq: Four times a day (QID) | INTRAVENOUS | Status: DC | PRN
  Administered 2017-06-25: 500 mg via INTRAVENOUS
  Filled 2017-06-25: qty 550

## 2017-06-25 MED ORDER — CHLORHEXIDINE GLUCONATE 4 % EX LIQD: 60.0000 mL | Freq: Once | CUTANEOUS | Status: DC

## 2017-06-25 MED ORDER — ACETAMINOPHEN 10 MG/ML IV SOLN
INTRAVENOUS | Status: AC
  Filled 2017-06-25: qty 100

## 2017-06-25 MED ORDER — CEFAZOLIN SODIUM-DEXTROSE 2-4 GM/100ML-% IV SOLN
INTRAVENOUS | Status: AC
  Filled 2017-06-25: qty 100

## 2017-06-25 MED ORDER — FLEET ENEMA 7-19 GM/118ML RE ENEM: 1.0000 | ENEMA | Freq: Once | RECTAL | Status: DC | PRN

## 2017-06-25 MED ORDER — FLUTICASONE PROPIONATE 50 MCG/ACT NA SUSP
1.0000 | Freq: Every day | NASAL | Status: DC
  Administered 2017-06-26 – 2017-06-27 (×2): 1 via NASAL
  Filled 2017-06-25: qty 16

## 2017-06-25 MED ORDER — ROPIVACAINE HCL 7.5 MG/ML IJ SOLN
INTRAMUSCULAR | Status: DC | PRN
  Administered 2017-06-25: 20 mL via PERINEURAL

## 2017-06-25 MED ORDER — EPHEDRINE 5 MG/ML INJ
INTRAVENOUS | Status: AC
  Filled 2017-06-25: qty 10

## 2017-06-25 MED ORDER — BUPIVACAINE HCL (PF) 0.25 % IJ SOLN
INTRAMUSCULAR | Status: AC
  Filled 2017-06-25: qty 30

## 2017-06-25 MED ORDER — HYDROMORPHONE HCL 2 MG PO TABS
2.0000 mg | ORAL_TABLET | ORAL | Status: DC | PRN
Start: 2017-06-25 — End: 2017-06-27
  Administered 2017-06-25 – 2017-06-26 (×4): 2 mg via ORAL
  Administered 2017-06-27: 1 mg via ORAL
  Filled 2017-06-25: qty 1
  Filled 2017-06-25: qty 2
  Filled 2017-06-25 (×3): qty 1

## 2017-06-25 MED ORDER — CEFAZOLIN SODIUM-DEXTROSE 2-4 GM/100ML-% IV SOLN
2.0000 g | Freq: Four times a day (QID) | INTRAVENOUS | Status: AC
  Administered 2017-06-25 (×2): 2 g via INTRAVENOUS
  Filled 2017-06-25 (×3): qty 100

## 2017-06-25 MED ORDER — CEFAZOLIN SODIUM-DEXTROSE 2-4 GM/100ML-% IV SOLN
2.0000 g | INTRAVENOUS | Status: AC
  Administered 2017-06-25: 2 g via INTRAVENOUS

## 2017-06-25 MED ORDER — PHENOL 1.4 % MT LIQD: 1.0000 | OROMUCOSAL | Status: DC | PRN

## 2017-06-25 MED ORDER — LACTATED RINGERS IV SOLN
INTRAVENOUS | Status: DC
  Administered 2017-06-25: 1000 mL via INTRAVENOUS
  Administered 2017-06-25: 12:00:00 via INTRAVENOUS

## 2017-06-25 MED ORDER — LEVOTHYROXINE SODIUM 75 MCG PO TABS
75.0000 ug | ORAL_TABLET | Freq: Every day | ORAL | Status: DC
  Administered 2017-06-26 – 2017-06-27 (×2): 75 ug via ORAL
  Filled 2017-06-25 (×2): qty 1

## 2017-06-25 MED ORDER — OXYCODONE HCL 5 MG/5ML PO SOLN: 5.0000 mg | Freq: Once | ORAL | Status: DC | PRN

## 2017-06-25 MED ORDER — LIDOCAINE 2% (20 MG/ML) 5 ML SYRINGE
INTRAMUSCULAR | Status: DC | PRN
  Administered 2017-06-25: 100 mg via INTRAVENOUS

## 2017-06-25 MED ORDER — DOCUSATE SODIUM 100 MG PO CAPS
100.0000 mg | ORAL_CAPSULE | Freq: Two times a day (BID) | ORAL | Status: DC
  Administered 2017-06-25 – 2017-06-27 (×4): 100 mg via ORAL
  Filled 2017-06-25 (×4): qty 1

## 2017-06-25 MED ORDER — SODIUM CHLORIDE 0.9 % IJ SOLN
INTRAMUSCULAR | Status: AC
Start: 2017-06-25 — End: 2017-06-25
  Filled 2017-06-25: qty 10

## 2017-06-25 MED ORDER — MIDAZOLAM HCL 5 MG/5ML IJ SOLN
INTRAMUSCULAR | Status: DC | PRN
  Administered 2017-06-25: 2 mg via INTRAVENOUS

## 2017-06-25 MED ORDER — SODIUM CHLORIDE 0.9 % IR SOLN
Status: DC | PRN
  Administered 2017-06-25: 1000 mL

## 2017-06-25 MED ORDER — TRANEXAMIC ACID 1000 MG/10ML IV SOLN
2000.0000 mg | Freq: Once | INTRAVENOUS | Status: DC
  Filled 2017-06-25: qty 20

## 2017-06-25 MED ORDER — FENTANYL CITRATE (PF) 100 MCG/2ML IJ SOLN
INTRAMUSCULAR | Status: AC
  Filled 2017-06-25: qty 2

## 2017-06-25 MED ORDER — MENTHOL 3 MG MT LOZG: 1.0000 | LOZENGE | OROMUCOSAL | Status: DC | PRN

## 2017-06-25 SURGICAL SUPPLY — 67 items
ADAPTER BOLT FEMORAL +2/-2 (Knees) ×3 IMPLANT
BAG DECANTER FOR FLEXI CONT (MISCELLANEOUS) ×3 IMPLANT
BAG ZIPLOCK 12X15 (MISCELLANEOUS) IMPLANT
BANDAGE ACE 6X5 VEL STRL LF (GAUZE/BANDAGES/DRESSINGS) ×3 IMPLANT
BLADE SAG 18X100X1.27 (BLADE) ×3 IMPLANT
BLADE SAW SGTL 11.0X1.19X90.0M (BLADE) ×3 IMPLANT
BONE CEMENT GENTAMICIN (Cement) ×9 IMPLANT
CEMENT BONE GENTAMICIN 40 (Cement) ×3 IMPLANT
CEMENT RESTRICTOR DEPUY SZ 4 (Cement) ×3 IMPLANT
CLOSURE WOUND 1/2 X4 (GAUZE/BANDAGES/DRESSINGS) ×2
CLOTH BEACON ORANGE TIMEOUT ST (SAFETY) ×3 IMPLANT
COMP FEM CEM RT SZ3 (Orthopedic Implant) ×3 IMPLANT
COMPONENT FEM CEM RT SZ3 (Orthopedic Implant) ×1 IMPLANT
COVER SURGICAL LIGHT HANDLE (MISCELLANEOUS) ×3 IMPLANT
CUFF TOURN SGL QUICK 34 (TOURNIQUET CUFF) ×2
CUFF TRNQT CYL 34X4X40X1 (TOURNIQUET CUFF) ×1 IMPLANT
DISTAL WEDGE PFC 4MM RIGHT (Knees) ×6 IMPLANT
DRAPE U-SHAPE 47X51 STRL (DRAPES) ×3 IMPLANT
DRSG ADAPTIC 3X8 NADH LF (GAUZE/BANDAGES/DRESSINGS) ×3 IMPLANT
DRSG PAD ABDOMINAL 8X10 ST (GAUZE/BANDAGES/DRESSINGS) IMPLANT
DURAPREP 26ML APPLICATOR (WOUND CARE) ×3 IMPLANT
ELECT REM PT RETURN 15FT ADLT (MISCELLANEOUS) ×3 IMPLANT
EVACUATOR 1/8 PVC DRAIN (DRAIN) ×3 IMPLANT
FEMORAL ADAPTER (Orthopedic Implant) ×3 IMPLANT
GAUZE SPONGE 4X4 12PLY STRL (GAUZE/BANDAGES/DRESSINGS) ×3 IMPLANT
GLOVE BIO SURGEON STRL SZ7.5 (GLOVE) IMPLANT
GLOVE BIO SURGEON STRL SZ8 (GLOVE) IMPLANT
GLOVE BIOGEL PI IND STRL 8 (GLOVE) ×1 IMPLANT
GLOVE BIOGEL PI INDICATOR 8 (GLOVE) ×2
GLOVE SURG SS PI 6.5 STRL IVOR (GLOVE) IMPLANT
GLOVE SURG SS PI 7.5 STRL IVOR (GLOVE) ×6 IMPLANT
GLOVE SURG SS PI 8.0 STRL IVOR (GLOVE) ×3 IMPLANT
GOWN STRL REUS W/TWL LRG LVL3 (GOWN DISPOSABLE) ×3 IMPLANT
GOWN STRL REUS W/TWL XL LVL3 (GOWN DISPOSABLE) IMPLANT
HANDPIECE INTERPULSE COAX TIP (DISPOSABLE) ×2
HOLDER FOLEY CATH W/STRAP (MISCELLANEOUS) ×3 IMPLANT
IMMOBILIZER KNEE 20 (SOFTGOODS) ×3
IMMOBILIZER KNEE 20 THIGH 36 (SOFTGOODS) ×1 IMPLANT
INSERT TIBIAL TC3 15.0 (Knees) ×3 IMPLANT
MANIFOLD NEPTUNE II (INSTRUMENTS) ×3 IMPLANT
NS IRRIG 1000ML POUR BTL (IV SOLUTION) ×3 IMPLANT
PACK TOTAL KNEE CUSTOM (KITS) ×3 IMPLANT
PAD ABD 8X10 STRL (GAUZE/BANDAGES/DRESSINGS) ×3 IMPLANT
PADDING CAST COTTON 6X4 STRL (CAST SUPPLIES) ×6 IMPLANT
POSITIONER SURGICAL ARM (MISCELLANEOUS) ×3 IMPLANT
POST AVE PFC 4MM (Knees) ×6 IMPLANT
SET HNDPC FAN SPRY TIP SCT (DISPOSABLE) ×1 IMPLANT
STEM TIBIA PFC 13X30MM (Stem) ×3 IMPLANT
STEM UNIVERSAL REVISION 75X16 (Stem) ×3 IMPLANT
STRIP CLOSURE SKIN 1/2X4 (GAUZE/BANDAGES/DRESSINGS) ×4 IMPLANT
SUT STRATAFIX 0 PDS 27 VIOLET (SUTURE) ×3
SUT VIC AB 2-0 CT1 27 (SUTURE) ×6
SUT VIC AB 2-0 CT1 TAPERPNT 27 (SUTURE) ×3 IMPLANT
SUTURE STRATFX 0 PDS 27 VIOLET (SUTURE) ×1 IMPLANT
SWAB COLLECTION DEVICE MRSA (MISCELLANEOUS) IMPLANT
SWAB CULTURE ESWAB REG 1ML (MISCELLANEOUS) IMPLANT
SYR 50ML LL SCALE MARK (SYRINGE) ×6 IMPLANT
TOWER CARTRIDGE SMART MIX (DISPOSABLE) ×3 IMPLANT
TRAY FOLEY CATH SILVER 16FR LF (SET/KITS/TRAYS/PACK) ×3 IMPLANT
TRAY FOLEY W/METER SILVER 16FR (SET/KITS/TRAYS/PACK) IMPLANT
TRAY REVISION SZ 2.5 (Knees) ×3 IMPLANT
TRAY SLEEVE CEM ML (Knees) ×3 IMPLANT
TUBE KAMVAC SUCTION (TUBING) IMPLANT
WATER STERILE IRR 1500ML POUR (IV SOLUTION) IMPLANT
WEDGE DISTAL PFC RIGHT 4MM (Knees) ×2 IMPLANT
WEDGE STEP SZ.5 5MM (Knees) ×6 IMPLANT
WRAP KNEE MAXI GEL POST OP (GAUZE/BANDAGES/DRESSINGS) ×3 IMPLANT

## 2017-06-25 NOTE — Anesthesia Postprocedure Evaluation (Signed)
Anesthesia Post Note  Patient: [name] [name]  Procedure(s) Performed: RIGHT TOTAL KNEE REVISION (Right Knee)     Patient location during evaluation: PACU Anesthesia Type: Spinal Level of consciousness: oriented and awake and alert Pain management: pain level controlled Vital Signs Assessment: post-procedure vital signs reviewed and stable Respiratory status: spontaneous breathing, respiratory function stable and patient connected to nasal cannula oxygen Cardiovascular status: blood pressure returned to baseline and stable Postop Assessment: no headache, no backache and no apparent nausea or vomiting Anesthetic complications: no    Last Vitals:  Vitals:   06/25/17 1145 06/25/17 1200  BP: 121/70 134/61  Pulse: 69   Resp: 13 13  Temp:    SpO2: 96%     Last Pain:  Vitals:   06/25/17 1145  TempSrc:   PainSc: [name]

## 2017-06-25 NOTE — Progress Notes (Signed)
Assisted Dr. Hodierne with right, ultrasound guided, adductor canal block. Side rails up, monitors on throughout procedure. See vital signs in flow sheet. Tolerated Procedure well.  

## 2017-06-25 NOTE — H&P (View-Only) (Signed)
[name] [name] DOB: May 11, 1941 Married / Language: English / Race: White Female Date of Admission:  06/25/2017 CC: Loose Right Total knee arthroplasty History of Present Illness The patient is a 76 year old female who comes in for a preoperative History and Physical. The patient is scheduled for a right polyethylene revison versus total knee revision to be performed by Dr. Dione Plover. Aluisio, MD at [facility] on 06-25-2017. The patient comes in several months out from left total knee arthroplasty. The patient states that she is doing very well at this time. The pain is under excellent control at this time and describe their pain as mild. They are currently on Tylenol (bid) for their pain. The patient is currently doing home exercise program. The patient feels that they are progressing well at this time. Her right knee has also been observed in the clinic. She had her right knee replaced by Dr. Donnajean Lopes about 15 years ago and now gets some discomfort with that. 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 The liner has worn down over time and now that she has rehab the left knee, she is ready to get the right knee revised at this time. They have been treated conservatively in the past for the above stated problem. It is felt that they would benefit from undergoing revision of the total joint replacement. Risks and benefits of the procedure have been discussed with the patient and they elect to proceed with surgery. There are no active contraindications to surgery such as ongoing infection or rapidly progressive neurological disease.  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 Codeine Sulfate *ANALGESICS - OPIOID*  Nausea, Vomiting. Morphine Sulfate *ANALGESICS - OPIOID*  Nausea, Vomiting.  Family History  Cancer  Sister. Cerebrovascular Accident  Maternal Grandmother, Mother, Paternal Grandmother. Chronic Obstructive Lung Disease  Sister. Diabetes Mellitus  Mother. Heart Disease  Father, Paternal Grandfather. Hypertension  Father, Maternal Grandmother, Mother, Paternal Grandmother. Osteoarthritis  Sister. Osteoporosis  Mother, Sister.  Social History Children  2 Current drinker  05/06/2016: Currently drinks wine and hard liquor less than 5 times per week Current work status  retired Furniture conservator/restorer daily; does running / walking Living situation  live with spouse Marital status  married No history of drug/alcohol rehab  Not under pain contract  Number of flights of stairs before winded  2-3 Tobacco / smoke exposure  05/06/2016: no Tobacco use  Former smoker. 05/06/2016: smoke(d) 3 more pack(s) per day Advance Directives  Living Will, Healthcare POA  Medication History  Advair Diskus (250-50MCG/DOSE Aero Pow Br Act, Inhalation) Active. Spiriva HandiHaler (18MCG Capsule, Inhalation) Active. Levothyroxine Sodium (75MCG Tablet, Oral) Active. Flecainide Acetate (50MG  Tablet, Oral) Active. Triamcinolone Acetonide (0.1% Cream, External) Active. (prn) Ventolin HFA (108 (90 Base)MCG/ACT Aerosol Soln, Inhalation) Active. Tylenol (Oral) Specific strength unknown - Active. Nasocort Active. Fexofenadine HCl (60MG  Tablet,  Oral) Active.  Meloxicam (7.5MG  Tablet, Oral) Active. Multivitamin Active.  Past Surgical History  Appendectomy  Date: 1962. Dilation and Curettage of Uterus  Date: 1968. Right Medial Meniscal Surgery  Date: 12. Total Knee Replacement  Date: 2002. right Gallbladder Surgery  Date: 2005. laporoscopic Cataract Surgery  bilateral, 2015 and 2016   Review of Systems  General Not Present- Chills, Fatigue, Fever, Memory Loss, Night Sweats, Weight Gain and Weight Loss. Skin Present- Bruising. Not Present- Eczema, Hives, Itching, Lesions and Rash. HEENT Present- Hearing problems. Not Present- Dentures, Double Vision, Headache, Hearing Loss, Tinnitus and Visual Loss. Respiratory Present- Allergies and Shortness of breath with exertion. Not Present- Chronic Cough, Coughing up blood and Shortness of breath at rest. Cardiovascular Present- Irregular Heart Beat. Not Present- Chest Pain, Difficulty Breathing Lying Down, Murmur, Palpitations, Racing/skipping heartbeats and Swelling. Gastrointestinal Present- Constipation. Not Present- Abdominal Pain, Bloody Stool, Diarrhea, Difficulty Swallowing, Heartburn, Jaundice, Loss of appetitie, Nausea and Vomiting. Female Genitourinary Present- Stress Incontinence. Not Present- Blood in Urine, Discharge, Flank Pain, Incontinence, Painful Urination, Urgency, Urinary frequency, Urinary Retention, Urinating at Night and Weak urinary stream. Musculoskeletal Present- Joint Pain and Joint Stiffness. Not Present- Back Pain, Joint Swelling, Morning Stiffness, Muscle Pain, Muscle Weakness and Spasms. Neurological Not Present- Blackout spells, Difficulty with balance, Dizziness, Paralysis, Tremor and Weakness. Psychiatric Not Present- Insomnia. Endocrine Present- Cold Intolerance. Hematology Present- Easy Bruising.  Vitals Weight: 130 lb Height: 62.5in Weight was reported by patient. Height was reported by patient. Body Surface Area: 1.6 m Body Mass  Index: 23.4 kg/m  Pulse: 64 (Regular)  BP: 138/64 (Sitting, Right Arm, Standard)   Physical Exam  General Mental Status -Alert, cooperative and good historian. General Appearance-pleasant, Not in acute distress. Orientation-Oriented X3. Build & Nutrition-Well nourished and Well developed.  Head and Neck Head-normocephalic, atraumatic . Neck Global Assessment - supple, no bruit auscultated on the right, no bruit auscultated on the left.  Eye Pupil - Bilateral-Regular and Round. Motion - Bilateral-EOMI.  Chest and Lung Exam Auscultation Breath sounds - clear at anterior chest wall and clear at posterior chest wall. Adventitious sounds - No Adventitious sounds.  Cardiovascular Auscultation Rhythm - Regular rate and rhythm. Heart Sounds - S1 WNL and S2 WNL. Murmurs & Other Heart Sounds - Auscultation of the heart reveals - No Murmurs.  Abdomen Palpation/Percussion Tenderness - Abdomen is non-tender to palpation. Rigidity (guarding) - Abdomen is soft. Auscultation Auscultation of the abdomen reveals - Bowel sounds normal.  Female Genitourinary Note: Not done, not pertinent to present illness   Musculoskeletal Note: Right knee range is about 0 to 105. No tenderness or instability.   Assessment & Plan  Status post left knee replacement (L24.401) Status post total right knee replacement (U27.253)  Note:Surgical Plans: Revison Right Total Knee  Disposition: Home, Straight to outpatient therapy  PCP: Nechama Guard, PA-C - Patient has been seen preoperatively and felt to be stable for surgery. Cards: Dr. Ubaldo Glassing - Patient has been seen preoperatively and felt to be stable for surgery.  Topical TXA - PE  Anesthesia Issues: None  Patient was instructed on what medications to stop prior to surgery.  Signed electronically by Joelene Millin, III PA-C

## 2017-06-25 NOTE — Op Note (Deleted)
  The note originally documented on this encounter has been moved the the encounter in which it belongs.  

## 2017-06-25 NOTE — Interval H&P Note (Signed)
History and Physical Interval Note:  06/25/2017 8:31 AM  [name] [name]  has presented today for surgery, with the diagnosis of Loose right total knee arthroplasty   The various methods of treatment have been discussed with the patient and family. After consideration of risks, benefits and other options for treatment, the patient has consented to  Procedure(s): RIGHT TOTAL KNEE REVISION (Right) as a surgical intervention .  The patient's history has been reviewed, patient examined, no change in status, stable for surgery.  I have reviewed the patient's chart and labs.  Questions were answered to the patient's satisfaction.     [name]

## 2017-06-25 NOTE — Anesthesia Procedure Notes (Signed)
Anesthesia Regional Block: Adductor canal block   Pre-Anesthetic Checklist: ,, timeout performed, Correct Patient, Correct Site, Correct Laterality, Correct Procedure, Correct Position, site marked, Risks and benefits discussed,  Surgical consent,  Pre-op evaluation,  At surgeon's request and post-op pain management  Laterality: Right  Prep: chloraprep       Needles:  Injection technique: Single-shot  Needle Type: Echogenic Needle     Needle Length: 9cm  Needle Gauge: 21     Additional Needles:   Narrative:  Start time: 06/25/2017 8:00 AM End time: 06/25/2017 8:06 AM Injection made incrementally with aspirations every 5 mL.  Performed by: Personally  Anesthesiologist: Kaleen Rochette  Additional Notes: Pt tolerated the procedure well.

## 2017-06-25 NOTE — Op Note (Signed)
NAMEKRYSTN, [name]                 ACCOUNT NO.:  [id number]  MEDICAL RECORD NO.:  [id number]  LOCATION:                               FACILITY:  [facility]  PHYSICIAN:  Gaynelle Arabian, M.D.    DATE OF BIRTH:  03-16-1941  DATE OF PROCEDURE:  06/25/2017 DATE OF DISCHARGE:                              OPERATIVE REPORT   PREOPERATIVE DIAGNOSIS:  Failed right total knee arthroplasty.  POSTOPERATIVE DIAGNOSIS:  Failed right total knee arthroplasty.  PROCEDURE:  Right total knee arthroplasty revision.  SURGEON:  Gaynelle Arabian, M.D.  ASSISTANT:  Alexzandrew L. Perkins, PA-C.  ANESTHESIA:  Adductor canal block and spinal.  ESTIMATED BLOOD LOSS:  50 mL.  DRAINS:  Hemovac x1.  TOURNIQUET TIME:  Up 38 minutes at 300 mmHg, down 8 minutes, and up additional 20 minutes at 300 mmHg.  COMPLICATIONS:  None.  CONDITION:  Stable to recovery.  BRIEF CLINICAL NOTE:  [name] is a 76 year old female, had a right total knee arthroplasty done approximately 15 years ago.  She had done well and over the past couple years, she has noticed some increased pain and instability in the knee.  It was felt that she had significant polyethylene wear or possible loosening of her prosthesis.  She presents now for polyethylene versus total knee revision.  PROCEDURE IN DETAIL:  After successful administration of adductor canal block and spinal, a tourniquet was placed high on her right thigh and her right lower extremity was prepped and draped in the usual sterile fashion.  Extremities wrapped in Esmarch and tourniquet inflated to 300 mmHg.  Midline incision was made with a 10 blade through subcutaneous tissue to the level of the extensor mechanism.  Fresh blade was used to make a medial arthrotomy.  She does not have a patella, thus it was not a parapatellar arthrotomy.  The soft tissue of the proximal medial tibia subperiosteally elevated to the joint line with a knife and into the semimembranosus bursa with  a Cobb elevator.  Soft tissue laterally was elevated with attention to avoiding disrupting the patellar tendon on tibial tubercle.  The tissue was subluxed laterally and knee flexed to 90 degrees.  There was a lot of metal stained debris in the joint. There were actual grooves in the femoral component longitudinal in nature consistent with wear debris having lodged between the polyethylene and metal and caused damage to the metal.  The polyethylene did not have a tremendous amount of wear.  I removed the tibial polyethylene from the tibial tray.  This was an [employer] knee that was fixed bearing posterior stabilized.  We removed the polyethylene, there was a significant amount of backside wear on the tibial tray.  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  I felt that there was no way that a polyethylene revision could account for the damage she had in the joint and would not be sufficient in order to address the wear in the metal components.  I thus decided to revise the entire knee.  We then subluxed the tibia forward and placed retractors circumferentially.  The oscillating saw was used to disrupt the interface between the tibial component and  bone.  Tibial component was removed with minimal to no bone loss.  I then took the cement out of the tibial canal.  We thoroughly irrigated the canal, reamed up to 13 mm for 13 mm stem.  The extramedullary tibial alignment guide was then placed referencing proximally at the medial aspect of the tibial tubercle and distally along the second metatarsal axis and tibial crest.  Block was pinned to remove about 2 mm off the current surface of the tibia.  This led to a nice flat resection surface.  A size 2.5 was most appropriate tibial component in the proximal tibia, it was prepared with a modular drill, then modular drill plus stem for the size 2.5.  I felt that we needed rotational control and then prepared for a 29-mm sleeve.   Tibial preparation was then completed.  We then addressed the femur.  The femoral components removed by disrupting interface between the component and bone with osteophytes. It was removed with essentially no bone loss.  There were significant grooves along the articular surface of the femoral component.  I believe this was with a metal debris, it was coming from to cause a metallosis. Once the components were removed, we then gained access to the femoral canal.  The canal was thoroughly irrigated with saline solution.  We reamed up to 16 mm.  It had an excellent fit.  This 16 mm reamer served as our intramedullary alignment guide.  The 5-degree right valgus alignment guide was placed for the distal femoral cut, and I took 4 mm off the medial and lateral sides.  We then drilled back with 4 mm augments distally.  The size 3 was most appropriate for the femoral component.  Spacer block was placed to get our rotation and then the rotation marked for the size 3 cutting block.  This led to an equal flexion gap at about 90 degrees of flexion.  The block was pinned in this rotation.  We did not remove any bone anteriorly.  Chamfers had minimal bone and posteriorly there was minimal bone going to the +4 position and thus, we needed 4 mm posterior augments also. Intercondylar block was placed, and intercondylar cut made for the TC3. Even though she did not have a patella, I felt that we would have enough stability with the TC3 rotating platform prosthesis to avoid any instability from the lack of the patella.  We then built the trial.  On the tibial side, a size 2.5 MBT revision tray with 5-mm augments medial and lateral, a 29 sleeve, and a 13 x 30 stem extension.  This was impacted into the tibia with excellent purchase.  On the femoral side, it was a size 3 TC3 femur with 4-mm augments distally medial and lateral and 4-mm augments posteriorly medial and lateral, a 16 x 75 stem in the +2  position 5 degrees of valgus.  The trial was placed again with excellent fit.  A 15 mm insert was necessary for stability.  Full extension was achieved with excellent varus, valgus, and anterior-posterior balance throughout full range of motion.  She did not have patella, but there was a lot of stained tissue in the extensor mechanism, which I removed.  At this point, tourniquet was released and kept down for 8 minutes and minor bleeding stopped with cautery.  The components were assembled on the back table during this time.  After 8 minutes, we rewrapped the leg in Esmarch and reinflated the tourniquet to 300 mmHg.  Components were then assembled.  Once assembled, then we thoroughly irrigated the joint with pulse lavage after removing the trial implants.  I injected a total of 20 mL of Exparel with 60 mL of saline into the posterior tissues, the extensor mechanism, the medial and lateral gutters, and the subcu tissues.  We then placed the cement restrictor for the tibia, which was a size 4. The cement was mixed, which was 3 batches of gentamicin impregnated cement.  Once ready, it was injected into tibial canal and the tibial components cemented into place.  On the femoral side, we cemented distally and had a press-fit stem.  Once all the cement was placed and the implants impacted, then extruded cement was removed.  A trial 15 mm insert was placed, knee held in full extension.  Once cement was fully hardened, then the permanent 15 mm TC3 rotating platform insert was placed into the tibial tray.  The knee was reduced with outstanding stability throughout full range of motion.  Wound was again irrigated, then the arthrotomy was then closed over Hemovac drain with a running #1 V-Loc suture.  The tourniquet was then released for a second time of 20 minutes.  Subcu was then closed with interrupted 2-0 Vicryl and subcuticular running 4-0 Monocryl.  The incision was cleaned and dried, and  Steri-Strips and a bulky sterile dressing were applied.  She was then awakened and transported to Recovery in stable condition.  Note that, a surgical assistant was a medical necessity for this procedure to do it in a safe and expeditious manner.  Surgical assistant was necessary for retraction of vital neurovascular structures and for proper positioning for the safe removal of the old prosthesis and safe and accurate placement of the new prosthesis.     Gaynelle Arabian, M.D.   ______________________________ Gaynelle Arabian, M.D.    FA/MEDQ  D:  06/25/2017  T:  06/25/2017  Job:  [id number]

## 2017-06-25 NOTE — Brief Op Note (Signed)
06/25/2017  10:26 AM  PATIENT:  [name] [name]  76 y.o. female  PRE-OPERATIVE DIAGNOSIS:  Loose right total knee arthroplasty   POST-OPERATIVE DIAGNOSIS:  Loose right total knee arthroplasty   PROCEDURE:  Procedure(s): RIGHT TOTAL KNEE REVISION (Right)  SURGEON:  Surgeon(s) and Role:    Gaynelle Arabian, MD - Primary  PHYSICIAN ASSISTANT:   ASSISTANTS: Arlee Muslim, PA-C   ANESTHESIA:   Adductor canal block and spinal  EBL:  50 mL   BLOOD ADMINISTERED:none  DRAINS: (Medium) Hemovact drain(s) in the right knee with  Suction Open   LOCAL MEDICATIONS USED:  OTHER Exparel  COUNTS:  YES  TOURNIQUET:   Total Tourniquet Time Documented: Thigh (Right) - 39 minutes Thigh (Right) - 19 minutes Total: Thigh (Right) - 58 minutes   DICTATION: .Other Dictation: Dictation Number [id number]  PLAN OF CARE: Admit to inpatient   PATIENT DISPOSITION:  PACU - hemodynamically stable.

## 2017-06-25 NOTE — Evaluation (Signed)
Physical Therapy Evaluation Patient Details Name: [name] [name] MRN: [id number] DOB: June 04, 1941 Today's Date: 06/25/2017   History of Present Illness  Pt s/p R TKR revision and with hx of L TKR 3/18.  Pt also with hx of PVD and COPD  Clinical Impression  Pt s/p R TKR revision and presents with decreased R LE strength/ROM and post op pain limiting functional mobility.  Pt should progress to dc home with family assist.    Follow Up Recommendations Home health PT;DC plan and follow up therapy as arranged by surgeon    Equipment Recommendations  None recommended by PT    Recommendations for Other Services       Precautions / Restrictions Precautions Precautions: Knee;Fall Required Braces or Orthoses: Knee Immobilizer - Right Knee Immobilizer - Right: Discontinue once straight leg raise with < 10 degree lag Restrictions Weight Bearing Restrictions: No RLE Weight Bearing: Weight bearing as tolerated      Mobility  Bed Mobility Overal bed mobility: Needs Assistance Bed Mobility: Supine to Sit     Supine to sit: Min assist     General bed mobility comments: cues for sequence and use of L LE to self assist  Transfers Overall transfer level: Needs assistance Equipment used: Rolling walker (2 wheeled) Transfers: Sit to/from Stand Sit to Stand: Min assist         General transfer comment: cues for LE management and use of UEs to self assist  Ambulation/Gait Ambulation/Gait assistance: Min assist Ambulation Distance (Feet): 35 Feet Assistive device: Rolling walker (2 wheeled) Gait Pattern/deviations: Step-to pattern;Decreased step length - right;Decreased step length - left;Shuffle;Trunk flexed;Antalgic Gait velocity: decr Gait velocity interpretation: Below normal speed for age/gender General Gait Details: cues for sequence, posture and position from [employer]            Wheelchair Mobility    Modified Rankin (Stroke Patients Only)       Balance  Overall balance assessment: Needs assistance Sitting-balance support: No upper extremity supported;Feet supported Sitting balance-Leahy Scale: Good     Standing balance support: Bilateral upper extremity supported Standing balance-Leahy Scale: Poor                               Pertinent Vitals/Pain Pain Assessment: 0-10 Pain Score: 5  Pain Location: R knee Pain Descriptors / Indicators: Aching;Sore Pain Intervention(s): Limited activity within patient's tolerance;Monitored during session;Premedicated before session;Ice applied    Home Living Family/patient expects to be discharged to:: Private residence Living Arrangements: Spouse/significant other Available Help at Discharge: Family Type of Home: House Home Access: Stairs to enter Entrance Stairs-Rails: Psychiatric nurse of Steps: 3 Home Layout: One level Home Equipment: [place] - single point;Walker - 2 wheels;Crutches;Bedside commode Additional Comments: bathroom with walk in shower is small/tight. Daughter who is a PT will be coming tomorrow    Prior Function Level of Independence: Independent               Hand Dominance        Extremity/Trunk Assessment   Upper Extremity Assessment Upper Extremity Assessment: Overall WFL for tasks assessed    Lower Extremity Assessment Lower Extremity Assessment: RLE deficits/detail    Cervical / Trunk Assessment Cervical / Trunk Assessment: Normal  Communication   Communication: No difficulties  Cognition Arousal/Alertness: Awake/alert Behavior During Therapy: WFL for tasks assessed/performed Overall Cognitive Status: Within Functional Limits for tasks assessed  General Comments      Exercises     Assessment/Plan    PT Assessment Patient needs continued PT services  PT Problem List Decreased strength;Decreased range of motion;Decreased activity tolerance;Decreased  mobility;Decreased balance;Decreased knowledge of use of DME;Pain       PT Treatment Interventions DME instruction;Gait training;Stair training;Functional mobility training;Therapeutic activities;Therapeutic exercise;Patient/family education    PT Goals (Current goals can be found in the Care Plan section)  Acute Rehab PT Goals Patient Stated Goal: Regain IND PT Goal Formulation: With patient Time For Goal Achievement: 07/01/17 Potential to Achieve Goals: Good    Frequency 7X/week   Barriers to discharge        Co-evaluation               AM-PAC PT "6 Clicks" Daily Activity  Outcome Measure Difficulty turning over in bed (including adjusting bedclothes, sheets and blankets)?: Unable Difficulty moving from lying on back to sitting on the side of the bed? : Unable Difficulty sitting down on and standing up from a chair with arms (e.g., wheelchair, bedside commode, etc,.)?: Unable Help needed moving to and from a bed to chair (including a wheelchair)?: A Little Help needed walking in hospital room?: A Little Help needed climbing 3-5 steps with a railing? : A Lot 6 Click Score: 11    End of Session Equipment Utilized During Treatment: Gait belt;Right knee immobilizer Activity Tolerance: Patient limited by pain;Patient limited by fatigue Patient left: in bed;with call bell/phone within reach Nurse Communication: Mobility status PT Visit Diagnosis: Unsteadiness on feet (R26.81);Difficulty in walking, not elsewhere classified (R26.2)    Time: 4650-3546 PT Time Calculation (min) (ACUTE ONLY): 23 min   Charges:   PT Evaluation $PT Eval Low Complexity: 1 Low PT Treatments $Gait Training: 8-22 mins   PT G Codes:        Pg [phone]   Makinzee Durley 06/25/2017, 5:16 PM

## 2017-06-25 NOTE — Transfer of Care (Signed)
Immediate Anesthesia Transfer of Care Note  Patient: [name] [name]  Procedure(s) Performed: RIGHT TOTAL KNEE REVISION (Right Knee)  Patient Location: PACU  Anesthesia Type:MAC and Spinal  Level of Consciousness: awake, alert , oriented and patient cooperative  Airway & Oxygen Therapy: Patient Spontanous Breathing and Patient connected to face mask oxygen  Post-op Assessment: Report given to RN and Post -op Vital signs reviewed and stable  Post vital signs: Reviewed and stable  Last Vitals:  Vitals:   06/25/17 0645  BP: 127/61  Pulse: 65  Resp: 14  Temp: 36.8 C  SpO2: 96%    Last Pain:  Vitals:   06/25/17 0645  TempSrc: Oral      Patients Stated Pain Goal: 4 (29/52/84 1324)  Complications: No apparent anesthesia complications

## 2017-06-25 NOTE — Discharge Instructions (Addendum)
°  ° °Dr. Frank Aluisio °Total Joint Specialist °[facility] °[address] °[place], [place] [address] °[phone] ° °TOTAL KNEE REPLACEMENT POSTOPERATIVE DIRECTIONS ° °Knee Rehabilitation, Guidelines Following Surgery  °Results after knee surgery are often greatly improved when you follow the exercise, range of motion and muscle strengthening exercises prescribed by your doctor. Safety measures are also important to protect the knee from further injury. Any time any of these exercises cause you to have increased pain or swelling in your knee joint, decrease the amount until you are comfortable again and slowly increase them. If you have problems or questions, call your caregiver or physical therapist for advice.  ° °HOME CARE INSTRUCTIONS  °Remove items at home which could result in a fall. This includes throw rugs or furniture in walking pathways.  °· ICE to the affected knee every three hours for 30 minutes at a time and then as needed for pain and swelling.  Continue to use ice on the knee for pain and swelling from surgery. You may notice swelling that will progress down to the foot and ankle.  This is normal after surgery.  Elevate the leg when you are not up walking on it.   °· Continue to use the breathing machine which will help keep your temperature down.  It is common for your temperature to cycle up and down following surgery, especially at night when you are not up moving around and exerting yourself.  The breathing machine keeps your lungs expanded and your temperature down. °· Do not place pillow under knee, focus on keeping the knee straight while resting ° °DIET °You may resume your previous home diet once your are discharged from the hospital. ° °DRESSING / WOUND CARE / SHOWERING °You may shower 3 days after surgery, but keep the wounds dry during showering.  You may use an occlusive plastic wrap (Press'n Seal for example), NO SOAKING/SUBMERGING IN THE BATHTUB.  If the  bandage gets wet, change with a clean dry gauze.  If the incision gets wet, pat the wound dry with a clean towel. °You may start showering once you are discharged home but do not submerge the incision under water. Just pat the incision dry and apply a dry gauze dressing on daily. °Change the surgical dressing daily and reapply a dry dressing each time. ° °ACTIVITY °Walk with your walker as instructed. °Use walker as long as suggested by your caregivers. °Avoid periods of inactivity such as sitting longer than an hour when not asleep. This helps prevent blood clots.  °You may resume a sexual relationship in one month or when given the OK by your doctor.  °You may return to work once you are cleared by your doctor.  °Do not drive a car for 6 weeks or until released by you surgeon.  °Do not drive while taking narcotics. ° °WEIGHT BEARING °Weight bearing as tolerated with assist device (walker, cane, etc) as directed, use it as long as suggested by your surgeon or therapist, typically at least 4-6 weeks. ° °POSTOPERATIVE CONSTIPATION PROTOCOL °Constipation - defined medically as fewer than three stools per week and severe constipation as less than one stool per week. ° °One of the most common issues patients have following surgery is constipation.  Even if you have a regular bowel pattern at home, your normal regimen is likely to be disrupted due to multiple reasons following surgery.  Combination of anesthesia, postoperative narcotics, change in appetite and fluid intake all can affect your   bowels.  In order to avoid complications following surgery, here are some recommendations in order to help you during your recovery period. ° °Colace (docusate) - Pick up an over-the-counter form of Colace or another stool softener and take twice a day as long as you are requiring postoperative pain medications.  Take with a full glass of water daily.  If you experience loose stools or diarrhea, hold the colace until you stool forms  back up.  If your symptoms do not get better within 1 week or if they get worse, check with your doctor. ° °Dulcolax (bisacodyl) - Pick up over-the-counter and take as directed by the product packaging as needed to assist with the movement of your bowels.  Take with a full glass of water.  Use this product as needed if not relieved by Colace only.  ° °MiraLax (polyethylene glycol) - Pick up over-the-counter to have on hand.  MiraLax is a solution that will increase the amount of water in your bowels to assist with bowel movements.  Take as directed and can mix with a glass of water, juice, soda, coffee, or tea.  Take if you go more than two days without a movement. °Do not use MiraLax more than once per day. Call your doctor if you are still constipated or irregular after using this medication for 7 days in a row. ° °If you continue to have problems with postoperative constipation, please contact the office for further assistance and recommendations.  If you experience "the worst abdominal pain ever" or develop nausea or vomiting, please contact the office immediatly for further recommendations for treatment. ° °ITCHING ° If you experience itching with your medications, try taking only a single pain pill, or even half a pain pill at a time.  You can also use Benadryl over the counter for itching or also to help with sleep.  ° °TED HOSE STOCKINGS °Wear the elastic stockings on both legs for three weeks following surgery during the day but you may remove then at night for sleeping. ° °MEDICATIONS °See your medication summary on the “After Visit Summary” that the nursing staff will review with you prior to discharge.  You may have some home medications which will be placed on hold until you complete the course of blood thinner medication.  It is important for you to complete the blood thinner medication as prescribed by your surgeon.  Continue your approved medications as instructed at time of  discharge. ° °PRECAUTIONS °If you experience chest pain or shortness of breath - call 911 immediately for transfer to the hospital emergency department.  °If you develop a fever greater that 101 F, purulent drainage from wound, increased redness or drainage from wound, foul odor from the wound/dressing, or calf pain - CONTACT YOUR SURGEON.   °                                                °FOLLOW-UP APPOINTMENTS °Make sure you keep all of your appointments after your operation with your surgeon and caregivers. You should call the office at the above phone number and make an appointment for approximately two weeks after the date of your surgery or on the date instructed by your surgeon outlined in the "After Visit Summary". ° ° °RANGE OF MOTION AND STRENGTHENING EXERCISES  °Rehabilitation of the knee is important following a knee   injury or an operation. After just a few days of immobilization, the muscles of the thigh which control the knee become weakened and shrink (atrophy). Knee exercises are designed to build up the tone and strength of the thigh muscles and to improve knee motion. Often times heat used for twenty to thirty minutes before working out will loosen up your tissues and help with improving the range of motion but do not use heat for the first two weeks following surgery. These exercises can be done on a training (exercise) mat, on the floor, on a table or on a bed. Use what ever works the best and is most comfortable for you Knee exercises include:  °Leg Lifts - While your knee is still immobilized in a splint or cast, you can do straight leg raises. Lift the leg to 60 degrees, hold for 3 sec, and slowly lower the leg. Repeat 10-20 times 2-3 times daily. Perform this exercise against resistance later as your knee gets better.  °Quad and Hamstring Sets - Tighten up the muscle on the front of the thigh (Quad) and hold for 5-10 sec. Repeat this 10-20 times hourly. Hamstring sets are done by pushing the  foot backward against an object and holding for 5-10 sec. Repeat as with quad sets.  °· Leg Slides: Lying on your back, slowly slide your foot toward your buttocks, bending your knee up off the floor (only go as far as is comfortable). Then slowly slide your foot back down until your leg is flat on the floor again. °· Angel Wings: Lying on your back spread your legs to the side as far apart as you can without causing discomfort.  °A rehabilitation program following serious knee injuries can speed recovery and prevent re-injury in the future due to weakened muscles. Contact your doctor or a physical therapist for more information on knee rehabilitation.  ° °IF YOU ARE TRANSFERRED TO A SKILLED REHAB FACILITY °If the patient is transferred to a skilled rehab facility following release from the hospital, a list of the current medications will be sent to the facility for the patient to continue.  When discharged from the skilled rehab facility, please have the facility set up the patient's Home Health Physical Therapy prior to being released. Also, the skilled facility will be responsible for providing the patient with their medications at time of release from the facility to include their pain medication, the muscle relaxants, and their blood thinner medication. If the patient is still at the rehab facility at time of the two week follow up appointment, the skilled rehab facility will also need to assist the patient in arranging follow up appointment in our office and any transportation needs. ° °MAKE SURE YOU:  °Understand these instructions.  °Get help right away if you are not doing well or get worse.  ° ° °Pick up stool softner and laxative for home use following surgery while on pain medications. °Do not submerge incision under water. °Please use good hand washing techniques while changing dressing each day. °May shower starting three days after surgery. °Please use a clean towel to pat the incision dry following  showers. °Continue to use ice for pain and swelling after surgery. °Do not use any lotions or creams on the incision until instructed by your surgeon. ° °Take Xarelto for two and a half more weeks following discharge from the hospital, then discontinue Xarelto. °Once the patient has completed the blood thinner regimen, then take a Baby 81 mg Aspirin daily   for three more weeks. ° ° °Information on my medicine - XARELTO® (Rivaroxaban) ° ° °Why was Xarelto® prescribed for you? °Xarelto® was prescribed for you to reduce the risk of blood clots forming after orthopedic surgery. The medical term for these abnormal blood clots is venous thromboembolism (VTE). ° °What do you need to know about xarelto® ? °Take your Xarelto® ONCE DAILY at the same time every day. °You may take it either with or without food. ° °If you have difficulty swallowing the tablet whole, you may crush it and mix in applesauce just prior to taking your dose. ° °Take Xarelto® exactly as prescribed by your doctor and DO NOT stop taking Xarelto® without talking to the doctor who prescribed the medication.  Stopping without other VTE prevention medication to take the place of Xarelto® may increase your risk of developing a clot. ° °After discharge, you should have regular check-up appointments with your healthcare provider that is prescribing your Xarelto®.   ° °What do you do if you miss a dose? °If you miss a dose, take it as soon as you remember on the same day then continue your regularly scheduled once daily regimen the next day. Do not take two doses of Xarelto® on the same day.  ° °Important Safety Information °A possible side effect of Xarelto® is bleeding. You should call your healthcare provider right away if you experience any of the following: °? Bleeding from an injury or your nose that does not stop. °? Unusual colored urine (red or dark brown) or unusual colored stools (red or black). °? Unusual bruising for unknown reasons. °? A serious  fall or if you hit your head (even if there is no bleeding). ° °Some medicines may interact with Xarelto® and might increase your risk of bleeding while on Xarelto®. To help avoid this, consult your healthcare provider or pharmacist prior to using any new prescription or non-prescription medications, including herbals, vitamins, non-steroidal anti-inflammatory drugs (NSAIDs) and supplements. ° °This website has more information on Xarelto®: www.xarelto.com. ° ° °

## 2017-06-25 NOTE — Anesthesia Preprocedure Evaluation (Signed)
Anesthesia Evaluation  Patient identified by MRN, date of birth, ID band Patient awake    Reviewed: Allergy & Precautions, H&P , NPO status , Patient's Chart, lab work & pertinent test results  History of Anesthesia Complications (+) Family history of anesthesia reaction and history of anesthetic complications  Airway Mallampati: II   Neck ROM: full    Dental   Pulmonary COPD, former smoker,    breath sounds clear to auscultation       Cardiovascular + Peripheral Vascular Disease   Rhythm:regular Rate:Normal     Neuro/Psych    GI/Hepatic GERD  ,  Endo/Other  Hypothyroidism   Renal/GU      Musculoskeletal  (+) Arthritis ,   Abdominal   Peds  Hematology   Anesthesia Other Findings   Reproductive/Obstetrics                             Anesthesia Physical Anesthesia Plan  ASA: III  Anesthesia Plan: Spinal   Post-op Pain Management:  Regional for Post-op pain   Induction: Intravenous  PONV Risk Score and Plan: 2 and Ondansetron, Dexamethasone, Propofol infusion and Treatment may vary due to age or medical condition  Airway Management Planned: Simple Face Mask  Additional Equipment:   Intra-op Plan:   Post-operative Plan:   Informed Consent: I have reviewed the patients History and Physical, chart, labs and discussed the procedure including the risks, benefits and alternatives for the proposed anesthesia with the patient or authorized representative who has indicated his/her understanding and acceptance.     Plan Discussed with: CRNA, Anesthesiologist and Surgeon  Anesthesia Plan Comments:         Anesthesia Quick Evaluation

## 2017-06-26 LAB — BASIC METABOLIC PANEL
ANION GAP: 6 (ref 5–15)
BUN: 10 mg/dL (ref 6–20)
CO2: 27 mmol/L (ref 22–32)
Calcium: 8.6 mg/dL — ABNORMAL LOW (ref 8.9–10.3)
Chloride: 105 mmol/L (ref 101–111)
Creatinine, Ser: 0.63 mg/dL (ref 0.44–1.00)
GFR calc Af Amer: >60 mL/min (ref >60)
GLUCOSE: 108 mg/dL — AB (ref 65–99)
POTASSIUM: 4.3 mmol/L (ref 3.5–5.1)
Sodium: 138 mmol/L (ref 135–145)

## 2017-06-26 LAB — CBC
HEMATOCRIT: 35.6 % — AB (ref 36.0–46.0)
Hemoglobin: 11.8 g/dL — ABNORMAL LOW (ref 12.0–15.0)
MCH: 32.5 pg (ref 26.0–34.0)
MCHC: 33.1 g/dL (ref 30.0–36.0)
MCV: 98.1 fL (ref 78.0–100.0)
Platelets: 174 10*3/uL (ref 150–400)
RBC: 3.63 MIL/uL — AB (ref 3.87–5.11)
RDW: 12.6 % (ref 11.5–15.5)
WBC: 9.2 10*3/uL (ref 4.0–10.5)

## 2017-06-26 NOTE — Progress Notes (Signed)
OT Cancellation Note  Patient Details Name: [name] [name] MRN: [id number] DOB: July 30, 1941   Cancelled Treatment:    Reason Eval/Treat Not Completed: OT screened, no needs identified, will sign off.  Pt recently had other knee replaced and feels comfortable with adls and bathroom transfers.  Astin Sayre 06/26/2017, 10:05 AM  Lesle Chris, OTR/L [phone] 06/26/2017

## 2017-06-26 NOTE — Progress Notes (Signed)
Physical Therapy Treatment Patient Details Name: [name] [name] MRN: [id number] DOB: Dec 03, 1940 Today's Date: 06/26/2017    History of Present Illness Pt s/p R TKR revision and with hx of L TKR 3/18.  Pt also with hx of PVD and COPD    PT Comments    POD # 1 am session Applied KI and instructed on use.  Assisted to EOB pt c/o Mod dizziness.  Dizziness increased with standing.  BP taken 109/42 HR 60.  Reported to RN. Did not amb pt but did assist to recliner then performed some TKR TE's to tolerance followed by ICE.   Follow Up Recommendations  Home health PT;DC plan and follow up therapy as arranged by surgeon     Equipment Recommendations  None recommended by PT    Recommendations for Other Services       Precautions / Restrictions Precautions Precautions: Knee;Fall Precaution Comments: instructed on KI use for amb Required Braces or Orthoses: Knee Immobilizer - Right Knee Immobilizer - Right: Discontinue once straight leg raise with < 10 degree lag Restrictions Weight Bearing Restrictions: No RLE Weight Bearing: Weight bearing as tolerated    Mobility  Bed Mobility Overal bed mobility: Needs Assistance Bed Mobility: Supine to Sit     Supine to sit: Min assist     General bed mobility comments: cues for sequence and use of L LE to self assist  Transfers Overall transfer level: Needs assistance Equipment used: Rolling walker (2 wheeled) Transfers: Sit to/from Stand Sit to Stand: Min assist         General transfer comment: 25% VC's on proper hand placement and demonstarted initial posterior LOB.  Mod c/o dizziness.  BP taken standing 109/46  HR 60  Ambulation/Gait         Gait velocity: decr   General Gait Details: transfers only this session due to c/o dizziness and low BP   Stairs            Wheelchair Mobility    Modified Rankin (Stroke Patients Only)       Balance                                             Cognition Arousal/Alertness: Awake/alert Behavior During Therapy: WFL for tasks assessed/performed Overall Cognitive Status: Within Functional Limits for tasks assessed                                        Exercises   Total Knee Replacement TE's 10 reps B LE ankle pumps 10 reps towel squeezes 10 reps knee presses   5 reps heel slides AAROM   5 reps SAQ's 10 reps SLR's AAROM 10 reps ABD AAROM Followed by ICE     General Comments        Pertinent Vitals/Pain Pain Assessment: 0-10 Pain Score: 3  Pain Location: R knee Pain Descriptors / Indicators: Aching;Sore;Operative site guarding Pain Intervention(s): Monitored during session;Ice applied    Home Living                      Prior Function            PT Goals (current goals can now be found in the care plan section) Progress towards PT goals: Progressing toward goals  Frequency    7X/week      PT Plan Current plan remains appropriate    Co-evaluation              AM-PAC PT "6 Clicks" Daily Activity  Outcome Measure  Difficulty turning over in bed (including adjusting bedclothes, sheets and blankets)?: Unable Difficulty moving from lying on back to sitting on the side of the bed? : Unable Difficulty sitting down on and standing up from a chair with arms (e.g., wheelchair, bedside commode, etc,.)?: Unable Help needed moving to and from a bed to chair (including a wheelchair)?: Total Help needed walking in hospital room?: Total Help needed climbing 3-5 steps with a railing? : Total 6 Click Score: 6    End of Session Equipment Utilized During Treatment: Gait belt;Right knee immobilizer Activity Tolerance: Patient limited by fatigue;Patient limited by pain (dizziness)     PT Visit Diagnosis: Unsteadiness on feet (R26.81);Difficulty in walking, not elsewhere classified (R26.2)     Time: 7017-7939 PT Time Calculation (min) (ACUTE ONLY): 28 min  Charges:  $Gait  Training: 8-22 mins $Therapeutic Exercise: 8-22 mins                    G Codes:       {Lizbeth Feijoo  PTA [facility]  Acute  Rehab Pager      [phone]

## 2017-06-26 NOTE — Progress Notes (Signed)
Subjective: 1 Day Post-Op Procedure(s) (LRB): RIGHT TOTAL KNEE REVISION (Right) Patient reports pain as mild.   Patient seen in rounds for Dr. Wynelle Link.  Not much sleep the night of surgery. Patient is well, and has had no acute complaints or problems We will start therapy today.  Plan is to go Home after hospital stay.  Objective: Vital signs in last 24 hours: Temp:  [97.3 F (36.3 C)-98.3 F (36.8 C)] 97.9 F (36.6 C) (11/01 0504) Pulse Rate:  [59-76] 59 (11/01 0504) Resp:  [12-18] 18 (11/01 0504) BP: (111-134)/(45-91) 113/91 (11/01 0504) SpO2:  [95 %-100 %] 98 % (11/01 0504) Weight:  [60.3 kg (133 lb)] 60.3 kg (133 lb) (10/31 1246)  Intake/Output from previous day:  Intake/Output Summary (Last 24 hours) at 06/26/17 0747 Last data filed at 06/26/17 0600  Gross per 24 hour  Intake             4455 ml  Output             3540 ml  Net              915 ml    Intake/Output this shift: No intake/output data recorded.  Labs:  Recent Labs  06/26/17 0524  HGB 11.8*    Recent Labs  06/26/17 0524  WBC 9.2  RBC 3.63*  HCT 35.6*  PLT 174    Recent Labs  06/26/17 0524  NA 138  K 4.3  CL 105  CO2 27  BUN 10  CREATININE 0.63  GLUCOSE 108*  CALCIUM 8.6*   No results for input(s): LABPT, INR in the last 72 hours.  EXAM General - Patient is Alert, Appropriate and Oriented Extremity - Neurovascular intact Sensation intact distally Intact pulses distally Dorsiflexion/Plantar flexion intact Dressing - dressing C/D/I Motor Function - intact, moving foot and toes well on exam.  Hemovac pulled without difficulty.  Past Medical History:  Diagnosis Date  . Allergy   . Allergy    allergy to spandex "had a bad reaction of blisters when using after a surgery"  . 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 Bruises easily   . Complication of anesthesia    reports history of "trouble waking up after surgery", mother developed fevers  questionable malignant hyperthermia   . COPD (chronic obstructive pulmonary disease) ([place])   . Dysrhythmia    frequent PVC's  . Family history of adverse reaction to anesthesia    mother with history of malignant hypertension  . GERD (gastroesophageal reflux disease)   . Heart palpitations   . Hyperlipidemia   . Hypothyroidism   . Peripheral vascular disease ([place]) 1980   pulmonary embolism  . Pneumonia   . Pulmonary embolism ([place])   . PVC (premature ventricular contraction)   . Shortness of breath dyspnea    with extertion  . Thyroid disease     Assessment/Plan: 1 Day Post-Op Procedure(s) (LRB): RIGHT TOTAL KNEE REVISION (Right) Principal Problem:   Failed total knee arthroplasty ([place])  Estimated body mass index is 24.33 kg/m as calculated from the following:   Height as of this encounter: 5\' 2"  (1.575 m).   Weight as of this encounter: 60.3 kg (133 lb). Advance diet Up with therapy Plan for discharge tomorrow  Home and straight to outpatient therapy  DVT Prophylaxis - Xarelto Weight-Bearing as tolerated to right leg D/C O2 and Pulse OX and try on Room Air  Arlee Muslim, PA-C Orthopaedic Surgery 06/26/2017, 7:47  AM

## 2017-06-26 NOTE — Progress Notes (Signed)
Physical Therapy Treatment Patient Details Name: [name] [name] MRN: [id number] DOB: 1941-08-21 Today's Date: 06/26/2017    History of Present Illness Pt s/p R TKR revision and with hx of L TKR 3/18.  Pt also with hx of PVD and COPD    PT Comments    Post Op Day 1 2:00pm treatment Ambulated 60 feet with walker with min guard. VC's required for hand placement with transfers. Educated on use of knee immobilizer with husband present. Positioned back in bed with CPM 10 to 40.    Follow Up Recommendations  Home health PT;DC plan and follow up therapy as arranged by surgeon     Equipment Recommendations  None recommended by PT    Recommendations for Other Services       Precautions / Restrictions Precautions Precautions: Knee;Fall Precaution Comments: instructed on KI use for amb Required Braces or Orthoses: Knee Immobilizer - Right Knee Immobilizer - Right: Discontinue once straight leg raise with < 10 degree lag Restrictions Weight Bearing Restrictions: No RLE Weight Bearing: Weight bearing as tolerated    Mobility  Bed Mobility Overal bed mobility: Needs Assistance Bed Mobility: Supine to Sit     Supine to sit: Min assist     General bed mobility comments: cues for sequencing and assist with L LE   Transfers Overall transfer level: Needs assistance Equipment used: Rolling walker (2 wheeled) Transfers: Sit to/from Stand Sit to Stand: Min assist         General transfer comment: 25% VC's on hand placement   Ambulation/Gait Ambulation/Gait assistance: Min assist Ambulation Distance (Feet): 60 Feet Assistive device: Rolling walker (2 wheeled) Gait Pattern/deviations: Step-to pattern;Decreased step length - right;Decreased step length - left;Trunk flexed;Antalgic Gait velocity: decreased Gait velocity interpretation: Below normal speed for age/gender General Gait Details: 25% VC's for technique with walker   Stairs            Wheelchair Mobility     Modified Rankin (Stroke Patients Only)       Balance                                            Cognition Arousal/Alertness: Awake/alert Behavior During Therapy: WFL for tasks assessed/performed Overall Cognitive Status: Within Functional Limits for tasks assessed                                        Exercises      General Comments        Pertinent Vitals/Pain Pain Assessment: 0-10 Pain Score: 2  Pain Location: R knee Pain Descriptors / Indicators: Aching;Sore;Operative site guarding Pain Intervention(s): Monitored during session;Ice applied;Repositioned    Home Living                      Prior Function            PT Goals (current goals can now be found in the care plan section) Progress towards PT goals: Progressing toward goals    Frequency    7X/week      PT Plan Current plan remains appropriate    Co-evaluation              AM-PAC PT "6 Clicks" Daily Activity  Outcome Measure  Difficulty turning over in bed (including adjusting  bedclothes, sheets and blankets)?: A Lot Difficulty moving from lying on back to sitting on the side of the bed? : A Lot Difficulty sitting down on and standing up from a chair with arms (e.g., wheelchair, bedside commode, etc,.)?: A Lot Help needed moving to and from a bed to chair (including a wheelchair)?: A Lot Help needed walking in hospital room?: A Lot Help needed climbing 3-5 steps with a railing? : Total 6 Click Score: 11    End of Session Equipment Utilized During Treatment: Gait belt;Right knee immobilizer Activity Tolerance: No increased pain;Patient tolerated treatment well Patient left: in bed;with call bell/phone within reach;with family/visitor present   PT Visit Diagnosis: Unsteadiness on feet (R26.81);Difficulty in walking, not elsewhere classified (R26.2)     Time: 7793-9030 PT Time Calculation (min) (ACUTE ONLY): 18 min  Charges:  $Gait  Training: 8-22 mins $Therapeutic Exercise: 8-22 mins                    G Codes:       Almond Lint, SPTA [facility]  PTA [facility]  Acute  Rehab Pager      [phone]

## 2017-06-26 NOTE — Progress Notes (Signed)
Discharge planning, no HH needs identified. Plan for OP PT, has DME. [phone] 

## 2017-06-27 LAB — BASIC METABOLIC PANEL
ANION GAP: 7 (ref 5–15)
BUN: 10 mg/dL (ref 6–20)
CO2: 25 mmol/L (ref 22–32)
CREATININE: 0.47 mg/dL (ref 0.44–1.00)
Calcium: 8.7 mg/dL — ABNORMAL LOW (ref 8.9–10.3)
Chloride: 100 mmol/L — ABNORMAL LOW (ref 101–111)
Glucose, Bld: 108 mg/dL — ABNORMAL HIGH (ref 65–99)
Potassium: 4 mmol/L (ref 3.5–5.1)
SODIUM: 132 mmol/L — AB (ref 135–145)

## 2017-06-27 LAB — CBC
HCT: 35.2 % — ABNORMAL LOW (ref 36.0–46.0)
HEMOGLOBIN: 12 g/dL (ref 12.0–15.0)
MCH: 32.4 pg (ref 26.0–34.0)
MCHC: 34.1 g/dL (ref 30.0–36.0)
MCV: 95.1 fL (ref 78.0–100.0)
Platelets: 176 10*3/uL (ref 150–400)
RBC: 3.7 MIL/uL — ABNORMAL LOW (ref 3.87–5.11)
RDW: 12.8 % (ref 11.5–15.5)
WBC: 8.7 10*3/uL (ref 4.0–10.5)

## 2017-06-27 MED ORDER — RIVAROXABAN 10 MG PO TABS: 10.0000 mg | ORAL_TABLET | Freq: Every day | ORAL | 0 refills | Status: DC

## 2017-06-27 MED ORDER — GABAPENTIN 300 MG PO CAPS: 300.0000 mg | ORAL_CAPSULE | Freq: Three times a day (TID) | ORAL | 0 refills | Status: DC

## 2017-06-27 MED ORDER — ALUM & MAG HYDROXIDE-SIMETH 200-200-20 MG/5ML PO SUSP
15.0000 mL | Freq: Once | ORAL | Status: AC
  Administered 2017-06-27: 15 mL via ORAL
  Filled 2017-06-27: qty 30

## 2017-06-27 MED ORDER — HYDROMORPHONE HCL 2 MG PO TABS: 1.0000 mg | ORAL_TABLET | ORAL | 0 refills | Status: DC | PRN

## 2017-06-27 MED ORDER — METHOCARBAMOL 500 MG PO TABS: 500.0000 mg | ORAL_TABLET | Freq: Four times a day (QID) | ORAL | 0 refills | Status: AC | PRN

## 2017-06-27 NOTE — Discharge Summary (Signed)
Physician Discharge Summary   Patient ID: [name] [name] MRN: [id number] DOB/AGE: 11/09/1940 76 y.o.  Admit date: 06/25/2017 Discharge date: 06-27-2017  Primary Diagnosis:  Loose right total knee arthroplasty   Admission Diagnoses:  Past Medical History:  Diagnosis Date  . Allergy   . Allergy    allergy to spandex "had a bad reaction of blisters when using after a surgery"  . 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 Bruises easily   . Complication of anesthesia    reports history of "trouble waking up after surgery", mother developed fevers questionable malignant hyperthermia   . COPD (chronic obstructive pulmonary disease) (HCC)   . Dysrhythmia    frequent PVC's  . Family history of adverse reaction to anesthesia    mother with history of malignant hypertension  . GERD (gastroesophageal reflux disease)   . Heart palpitations   . Hyperlipidemia   . Hypothyroidism   . Peripheral vascular disease (HCC) 1980   pulmonary embolism  . Pneumonia   . Pulmonary embolism (HCC)   . PVC (premature ventricular contraction)   . Shortness of breath dyspnea    with extertion  . Thyroid disease    Discharge Diagnoses:   Principal Problem:   Failed total knee arthroplasty (HCC)  Estimated body mass index is 24.33 kg/m as calculated from the following:   Height as of this encounter: 5' 2" (1.575 m).   Weight as of this encounter: 60.3 kg (133 lb).  Procedure:  Procedure(s) (LRB): RIGHT TOTAL KNEE REVISION (Right)   Consults: None  HPI: [name] [name] is a 76-year-old female, had a right total knee arthroplasty done approximately 15 years ago.  She had done well and over the past couple years, she has noticed some increased pain and instability in the knee.  It was felt that she had significant polyethylene wear or possible loosening of her prosthesis.  She presents now for polyethylene versus total knee revision. Laboratory Data: Admission on 06/25/2017    Component Date Value Ref Range Status  . WBC 06/26/2017 9.2  4.0 - 10.5 K/uL Final  . RBC 06/26/2017 3.63* 3.87 - 5.11 MIL/uL Final  . Hemoglobin 06/26/2017 11.8* 12.0 - 15.0 g/dL Final  . HCT 06/26/2017 35.6* 36.0 - 46.0 % Final  . MCV 06/26/2017 98.1  78.0 - 100.0 fL Final  . MCH 06/26/2017 32.5  26.0 - 34.0 pg Final  . MCHC 06/26/2017 33.1  30.0 - 36.0 g/dL Final  . RDW 06/26/2017 12.6  11.5 - 15.5 % Final  . Platelets 06/26/2017 174  150 - 400 K/uL Final  . Sodium 06/26/2017 138  135 - 145 mmol/L Final  . Potassium 06/26/2017 4.3  3.5 - 5.1 mmol/L Final  . Chloride 06/26/2017 105  101 - 111 mmol/L Final  . CO2 06/26/2017 27  22 - 32 mmol/L Final  . Glucose, Bld 06/26/2017 108* 65 - 99 mg/dL Final  . BUN 06/26/2017 10  6 - 20 mg/dL Final  . Creatinine, Ser 06/26/2017 0.63  0.44 - 1.00 mg/dL Final  . Calcium 06/26/2017 8.6* 8.9 - 10.3 mg/dL Final  . GFR calc non Af Amer 06/26/2017 >60  >60 mL/min Final  . GFR calc Af Amer 06/26/2017 >60  >60 mL/min Final   Comment: (NOTE) The eGFR has been calculated using the CKD EPI equation. This calculation has not been validated in all clinical situations. eGFR's persistently <60 mL/min signify possible Chronic Kidney Disease.   . Anion gap   06/26/2017 6  5 - 15 Final  . WBC 06/27/2017 8.7  4.0 - 10.5 K/uL Final  . RBC 06/27/2017 3.70* 3.87 - 5.11 MIL/uL Final  . Hemoglobin 06/27/2017 12.0  12.0 - 15.0 g/dL Final  . HCT 06/27/2017 35.2* 36.0 - 46.0 % Final  . MCV 06/27/2017 95.1  78.0 - 100.0 fL Final  . MCH 06/27/2017 32.4  26.0 - 34.0 pg Final  . MCHC 06/27/2017 34.1  30.0 - 36.0 g/dL Final  . RDW 06/27/2017 12.8  11.5 - 15.5 % Final  . Platelets 06/27/2017 176  150 - 400 K/uL Final  . Sodium 06/27/2017 132* 135 - 145 mmol/L Final  . Potassium 06/27/2017 4.0  3.5 - 5.1 mmol/L Final  . Chloride 06/27/2017 100* 101 - 111 mmol/L Final  . CO2 06/27/2017 25  22 - 32 mmol/L Final  . Glucose, Bld 06/27/2017 108* 65 - 99 mg/dL Final  . BUN  06/27/2017 10  6 - 20 mg/dL Final  . Creatinine, Ser 06/27/2017 0.47  0.44 - 1.00 mg/dL Final  . Calcium 06/27/2017 8.7* 8.9 - 10.3 mg/dL Final  . GFR calc non Af Amer 06/27/2017 >60  >60 mL/min Final  . GFR calc Af Amer 06/27/2017 >60  >60 mL/min Final   Comment: (NOTE) The eGFR has been calculated using the CKD EPI equation. This calculation has not been validated in all clinical situations. eGFR's persistently <60 mL/min signify possible Chronic Kidney Disease.   . Anion gap 06/27/2017 7  5 - 15 Final  Hospital Outpatient Visit on 06/20/2017  Component Date Value Ref Range Status  . aPTT 06/20/2017 28  24 - 36 seconds Final  . WBC 06/20/2017 5.8  4.0 - 10.5 K/uL Final  . RBC 06/20/2017 4.60  3.87 - 5.11 MIL/uL Final  . Hemoglobin 06/20/2017 15.2* 12.0 - 15.0 g/dL Final  . HCT 06/20/2017 45.1  36.0 - 46.0 % Final  . MCV 06/20/2017 98.0  78.0 - 100.0 fL Final  . MCH 06/20/2017 33.0  26.0 - 34.0 pg Final  . MCHC 06/20/2017 33.7  30.0 - 36.0 g/dL Final  . RDW 06/20/2017 13.0  11.5 - 15.5 % Final  . Platelets 06/20/2017 210  150 - 400 K/uL Final  . Sodium 06/20/2017 139  135 - 145 mmol/L Final  . Potassium 06/20/2017 5.0  3.5 - 5.1 mmol/L Final  . Chloride 06/20/2017 105  101 - 111 mmol/L Final  . CO2 06/20/2017 26  22 - 32 mmol/L Final  . Glucose, Bld 06/20/2017 99  65 - 99 mg/dL Final  . BUN 06/20/2017 16  6 - 20 mg/dL Final  . Creatinine, Ser 06/20/2017 0.62  0.44 - 1.00 mg/dL Final  . Calcium 06/20/2017 9.7  8.9 - 10.3 mg/dL Final  . Total Protein 06/20/2017 7.0  6.5 - 8.1 g/dL Final  . Albumin 06/20/2017 4.1  3.5 - 5.0 g/dL Final  . AST 06/20/2017 24  15 - 41 U/L Final  . ALT 06/20/2017 22  14 - 54 U/L Final  . Alkaline Phosphatase 06/20/2017 61  38 - 126 U/L Final  . Total Bilirubin 06/20/2017 1.2  0.3 - 1.2 mg/dL Final  . GFR calc non Af Amer 06/20/2017 >60  >60 mL/min Final  . GFR calc Af Amer 06/20/2017 >60  >60 mL/min Final   Comment: (NOTE) The eGFR has been  calculated using the CKD EPI equation. This calculation has not been validated in all clinical situations. eGFR's persistently <60 mL/min signify possible Chronic Kidney   Disease.   . Anion gap 06/20/2017 8  5 - 15 Final  . Prothrombin Time 06/20/2017 12.1  11.4 - 15.2 seconds Final  . INR 06/20/2017 0.90   Final  . ABO/RH(D) 06/20/2017 A NEG   Final  . Antibody Screen 06/20/2017 NEG   Final  . Sample Expiration 06/20/2017 06/28/2017   Final  . Extend sample reason 06/20/2017 NO TRANSFUSIONS OR PREGNANCY IN THE PAST 3 MONTHS   Final  . MRSA, PCR 06/20/2017 NEGATIVE  NEGATIVE Final  . Staphylococcus aureus 06/20/2017 NEGATIVE  NEGATIVE Final   Comment: (NOTE) The Xpert SA Assay (FDA approved for NASAL specimens in patients 78 years of age and older), is one component of a comprehensive surveillance program. It is not intended to diagnose infection nor to guide or monitor treatment.      X-Rays:No results found.  EKG: Orders placed or performed during the hospital encounter of 11/04/16  . EKG 12-Lead  . EKG 12-Lead     Hospital Course: [name] [name] is a 76 y.o. who was admitted to [facility]. They were brought to the operating room on 06/25/2017 and underwent Procedure(s): RIGHT TOTAL KNEE REVISION.  Patient tolerated the procedure well and was later transferred to the recovery room and then to the orthopaedic floor for postoperative care.  They were given PO and IV analgesics for pain control following their surgery.  They were given 24 hours of postoperative antibiotics of  Anti-infectives    Start     Dose/Rate Route Frequency Ordered Stop   06/25/17 1500  ceFAZolin (ANCEF) IVPB 2g/100 mL premix     2 g 200 mL/hr over 30 Minutes Intravenous Every 6 hours 06/25/17 1246 06/25/17 2041   06/25/17 0646  ceFAZolin (ANCEF) 2-4 GM/100ML-% IVPB    Comments:  [name] Session   : cabinet override      06/25/17 [id number] 06/25/17 0849   06/25/17 0642  ceFAZolin (ANCEF) IVPB 2g/100  mL premix     2 g 200 mL/hr over 30 Minutes Intravenous On call to O.R. 06/25/17 [id number] 06/25/17 0919     and started on DVT prophylaxis in the form of Xarelto.   PT and OT were ordered for total joint protocol.  Discharge planning consulted to help with postop disposition and equipment needs.  Patient had a tough night on the evening of surgery.  They started to get up OOB with therapy on day one. Hemovac drain was pulled without difficulty.  Continued to work with therapy into day two.  Dressing was changed on day two and the incision was healing well. Patient was seen in rounds on POD 2 and was ready to go home.  Discharge home, straight to outpatient therapy Diet - Cardiac diet Follow up - in 2 weeks Activity - WBAT Disposition - Home Condition Upon Discharge - Good D/C Meds - See DC Summary DVT Prophylaxis - Xarelto   Discharge Instructions    Call MD / Call 911    Complete by:  As directed    If you experience chest pain or shortness of breath, CALL 911 and be transported to the hospital emergency room.  If you develope a fever above 101 F, pus (white drainage) or increased drainage or redness at the wound, or calf pain, call your surgeon's office.   Change dressing    Complete by:  As directed    Change dressing daily with sterile 4 x 4 inch gauze dressing and apply TED hose. Do not submerge the  incision under water.   Constipation Prevention    Complete by:  As directed    Drink plenty of fluids.  Prune juice may be helpful.  You may use a stool softener, such as Colace (over the counter) 100 mg twice a day.  Use MiraLax (over the counter) for constipation as needed.   Diet - low sodium heart healthy    Complete by:  As directed    Discharge instructions    Complete by:  As directed    Take Xarelto for two and a half more weeks, then discontinue Xarelto. Once the patient has completed the blood thinner regimen, then take a Baby 81 mg Aspirin daily for three more weeks.   Pick  up stool softner and laxative for home use following surgery while on pain medications. Do not submerge incision under water. Please use good hand washing techniques while changing dressing each day. May shower starting three days after surgery. Please use a clean towel to pat the incision dry following showers. Continue to use ice for pain and swelling after surgery. Do not use any lotions or creams on the incision until instructed by your surgeon.  Wear both TED hose on both legs during the day every day for three weeks, but may remove the TED hose at night at home.  Postoperative Constipation Protocol  Constipation - defined medically as fewer than three stools per week and severe constipation as less than one stool per week.  One of the most common issues patients have following surgery is constipation.  Even if you have a regular bowel pattern at home, your normal regimen is likely to be disrupted due to multiple reasons following surgery.  Combination of anesthesia, postoperative narcotics, change in appetite and fluid intake all can affect your bowels.  In order to avoid complications following surgery, here are some recommendations in order to help you during your recovery period.  Colace (docusate) - Pick up an over-the-counter form of Colace or another stool softener and take twice a day as long as you are requiring postoperative pain medications.  Take with a full glass of water daily.  If you experience loose stools or diarrhea, hold the colace until you stool forms back up.  If your symptoms do not get better within 1 week or if they get worse, check with your doctor.  Dulcolax (bisacodyl) - Pick up over-the-counter and take as directed by the product packaging as needed to assist with the movement of your bowels.  Take with a full glass of water.  Use this product as needed if not relieved by Colace only.   MiraLax (polyethylene glycol) - Pick up over-the-counter to have on hand.   MiraLax is a solution that will increase the amount of water in your bowels to assist with bowel movements.  Take as directed and can mix with a glass of water, juice, soda, coffee, or tea.  Take if you go more than two days without a movement. Do not use MiraLax more than once per day. Call your doctor if you are still constipated or irregular after using this medication for 7 days in a row.  If you continue to have problems with postoperative constipation, please contact the office for further assistance and recommendations.  If you experience "the worst abdominal pain ever" or develop nausea or vomiting, please contact the office immediatly for further recommendations for treatment.   Do not put a pillow under the knee. Place it under the heel.  Complete by:  As directed    Do not sit on low chairs, stoools or toilet seats, as it may be difficult to get up from low surfaces    Complete by:  As directed    Driving restrictions    Complete by:  As directed    No driving until released by the physician.   Increase activity slowly as tolerated    Complete by:  As directed    Lifting restrictions    Complete by:  As directed    No lifting until released by the physician.   Patient may shower    Complete by:  As directed    You may shower without a dressing once there is no drainage.  Do not wash over the wound.  If drainage remains, do not shower until drainage stops.   TED hose    Complete by:  As directed    Use stockings (TED hose) for 3 weeks on both leg(s).  You may remove them at night for sleeping.   Weight bearing as tolerated    Complete by:  As directed    Laterality:  right   Extremity:  Lower     Allergies as of 06/27/2017      Reactions   Latex Itching   Codeine Other (See Comments)   Unknown   Diphenhydramine Other (See Comments)   Decreased BP and pulse rate   Montelukast Sodium Other (See Comments)   Insomnia   Morphine Sulfate Other (See Comments)   Unknown    Nsaids Other (See Comments)   Can take Meloxicam, Naproxen   Other Other (See Comments)   Opiates-nausea/vomiting Spandex-itching/rash/blisters Latex-itching   Tramadol Other (See Comments)   Mental status change   Celecoxib Rash, Other (See Comments)   Epigastric pain   Rofecoxib Rash, Other (See Comments)   Epigastric pain (vioxx)      Medication List    STOP taking these medications   meloxicam 7.5 MG tablet Commonly known as:  MOBIC   MULTIVITAMIN PO     TAKE these medications   acetaminophen 500 MG tablet Commonly known as:  TYLENOL Take 500 mg by mouth every 6 (six) hours as needed for moderate pain or headache.   ADVAIR DISKUS 250-50 MCG/DOSE Aepb Generic drug:  Fluticasone-Salmeterol Inhale 1 puff into the lungs 2 (two) times daily.   albuterol 108 (90 Base) MCG/ACT inhaler Commonly known as:  VENTOLIN HFA Inhale 1-2 puffs into the lungs every 4 (four) hours as needed for wheezing or shortness of breath. Reported on 12/04/2015   benzonatate 100 MG capsule Commonly known as:  TESSALON Take 1 capsule (100 mg total) by mouth daily.   docusate sodium 100 MG capsule Commonly known as:  COLACE Take 100 mg by mouth daily.   fexofenadine 180 MG tablet Commonly known as:  ALLEGRA Take 180 mg by mouth daily as needed for allergies.   flecainide 50 MG tablet Commonly known as:  TAMBOCOR Take 50 mg by mouth 2 (two) times daily.   gabapentin 300 MG capsule Commonly known as:  NEURONTIN Take 1 capsule (300 mg total) by mouth 3 (three) times daily. Gabapentin 300 mg Protocol Take a 300 mg capsule three times a day for one week, Then a 300 mg capsule twice a day for one week, Then a 300 mg capsule once a day for one week, then discontinue the Gabapentin.   HYDROmorphone 2 MG tablet Commonly known as:  DILAUDID Take 0.5-1 tablets (1-2 mg total) by   mouth every 4 (four) hours as needed for severe pain. What changed:  how much to take  reasons to take this     levothyroxine 75 MCG tablet Commonly known as:  SYNTHROID, LEVOTHROID Take 1 tablet (75 mcg total) by mouth daily.   methocarbamol 500 MG tablet Commonly known as:  ROBAXIN Take 1 tablet (500 mg total) by mouth every 6 (six) hours as needed for muscle spasms.   NASACORT AQ NA Place 2 sprays into both nostrils daily.   polyethylene glycol packet Commonly known as:  MIRALAX / GLYCOLAX Take 17 g by mouth at bedtime as needed for moderate constipation.   rivaroxaban 10 MG Tabs tablet Commonly known as:  XARELTO Take 1 tablet (10 mg total) by mouth daily with breakfast. Take Xarelto for two and a half more weeks following discharge from the hospital, then discontinue Xarelto. Once the patient has completed the blood thinner regimen, then take a Baby 81 mg Aspirin daily for three more weeks. What changed:  additional instructions   SYSTANE OP Apply 1-2 drops to eye 3 (three) times daily as needed (for dry eyes).   tiotropium 18 MCG inhalation capsule Commonly known as:  SPIRIVA HANDIHALER Place 1 capsule (18 mcg total) into inhaler and inhale daily.            Discharge Care Instructions        Start     Ordered   06/27/17 0000  Weight bearing as tolerated    Question Answer Comment  Laterality right   Extremity Lower      06/27/17 0713   06/27/17 0000  Change dressing    Comments:  Change dressing daily with sterile 4 x 4 inch gauze dressing and apply TED hose. Do not submerge the incision under water.   06/27/17 0713     Follow-up Information    Aluisio, Frank, MD. Schedule an appointment as soon as possible for a visit on 07/08/2017.   Specialty:  Orthopedic Surgery Contact information: [address] [place] [address] [phone]           Signed: Drew Perkins, PA-C Orthopaedic Surgery 06/27/2017, 7:15 AM     

## 2017-06-27 NOTE — Progress Notes (Signed)
Subjective: 2 Days Post-Op Procedure(s) (LRB): RIGHT TOTAL KNEE REVISION (Right) Patient reports pain as mild.   Patient seen in rounds for Dr. Wynelle Link.  She did well last night and had a better night. Patient is well, and has had no acute complaints or problems Patient is ready to go home following therapy goals.  Objective: Vital signs in last 24 hours: Temp:  [97.6 F (36.4 C)-98.4 F (36.9 C)] 98.4 F (36.9 C) (11/02 0531) Pulse Rate:  [59-81] 81 (11/02 0531) Resp:  [14-18] 16 (11/02 0531) BP: (109-135)/(46-55) 122/51 (11/02 0531) SpO2:  [91 %-98 %] 93 % (11/02 0531)  Intake/Output from previous day:  Intake/Output Summary (Last 24 hours) at 06/27/17 0708 Last data filed at 06/27/17 0620  Gross per 24 hour  Intake              900 ml  Output             3475 ml  Net            -2575 ml    Intake/Output this shift: No intake/output data recorded.  Labs:  Recent Labs  06/26/17 0524 06/27/17 0541  HGB 11.8* 12.0    Recent Labs  06/26/17 0524 06/27/17 0541  WBC 9.2 8.7  RBC 3.63* 3.70*  HCT 35.6* 35.2*  PLT 174 176    Recent Labs  06/26/17 0524 06/27/17 0541  NA 138 132*  K 4.3 4.0  CL 105 100*  CO2 27 25  BUN 10 10  CREATININE 0.63 0.47  GLUCOSE 108* 108*  CALCIUM 8.6* 8.7*   No results for input(s): LABPT, INR in the last 72 hours.  EXAM: General - Patient is Alert, Appropriate and Oriented Extremity - Neurovascular intact Sensation intact distally Intact pulses distally Dorsiflexion/Plantar flexion intact Incision - clean, dry, no drainage, healing Motor Function - intact, moving foot and toes well on exam.   Assessment/Plan: 2 Days Post-Op Procedure(s) (LRB): RIGHT TOTAL KNEE REVISION (Right) Procedure(s) (LRB): RIGHT TOTAL KNEE REVISION (Right) Past Medical History:  Diagnosis Date  . Allergy   . Allergy    allergy to spandex "had a bad reaction of blisters when using after a surgery"  . Arthritis    in left knee; having  replacement surgery in 10/2016  . Bruises easily   . Complication of anesthesia    reports history of "trouble waking up after surgery", mother developed fevers questionable malignant hyperthermia   . COPD (chronic obstructive pulmonary disease) ([place])   . Dysrhythmia    frequent PVC's  . Family history of adverse reaction to anesthesia    mother with history of malignant hypertension  . GERD (gastroesophageal reflux disease)   . Heart palpitations   . Hyperlipidemia   . Hypothyroidism   . Peripheral vascular disease ([place]) 1980   pulmonary embolism  . Pneumonia   . Pulmonary embolism ([place])   . PVC (premature ventricular contraction)   . Shortness of breath dyspnea    with extertion  . Thyroid disease    Principal Problem:   Failed total knee arthroplasty ([place])  Estimated body mass index is 24.33 kg/m as calculated from the following:   Height as of this encounter: 5\' 2"  (1.575 m).   Weight as of this encounter: 60.3 kg (133 lb). Up with therapy Discharge home, straight to outpatient therapy Diet - Cardiac diet Follow up - in 2 weeks Activity - WBAT Disposition - Home Condition Upon Discharge - Good D/C Meds - See DC Summary  DVT Prophylaxis - Xarelto  Arlee Muslim, PA-C Orthopaedic Surgery 06/27/2017, 7:08 AM

## 2017-06-27 NOTE — Progress Notes (Signed)
Physical Therapy Treatment Patient Details Name: [name] [name] MRN: [id number] DOB: 08-27-1940 Today's Date: 06/27/2017    History of Present Illness Pt s/p R TKR revision and with hx of L TKR 3/18.  Pt also with hx of PVD and COPD    PT Comments    POD # 2 am session Pt has met goals to D/C to home after one PT session today. Applied KI and instructed on use esp for stairs.  Assisted with amb, practiced stairs then performed some TKR TE's following HEP handout.  Instructed on proper tech, freq as well as use of ICE.  OP to start Monday.   Follow Up Recommendations        Equipment Recommendations    none   Recommendations for Other Services  OP PT as arranged by MD     Precautions / Restrictions Precautions Precautions: Knee;Fall Precaution Comments: instructed on KI use for amb Required Braces or Orthoses: Knee Immobilizer - Right Knee Immobilizer - Right: Discontinue once straight leg raise with < 10 degree lag Restrictions Weight Bearing Restrictions: No RLE Weight Bearing: Weight bearing as tolerated    Mobility  Bed Mobility Overal bed mobility: Needs Assistance Bed Mobility: Supine to Sit     Supine to sit: Min guard;Supervision     General bed mobility comments: cues for sequencing and assist with L LE   Transfers Overall transfer level: Needs assistance Equipment used: Rolling walker (2 wheeled) Transfers: Sit to/from [facility] Sit to Stand: Supervision;Min guard Stand pivot transfers: Supervision;Min guard       General transfer comment: 25% VC's on hand placement and safety with turns  Ambulation/Gait Ambulation/Gait assistance: Supervision;Min guard Ambulation Distance (Feet): 25 Feet Assistive device: Rolling walker (2 wheeled)   Gait velocity: decreased   General Gait Details: 25% VC's for technique with walker esp with turns    decreased amb distance due to increased c/o pain and fatigue    Stairs Stairs: Yes    Stair Management: Two rails;Step to pattern;Forwards Number of Stairs: 2 General stair comments: 25% VC's on proper sequencing and safety.   Wheelchair Mobility    Modified Rankin (Stroke Patients Only)       Balance                                            Cognition Arousal/Alertness: Awake/alert Behavior During Therapy: WFL for tasks assessed/performed Overall Cognitive Status: Within Functional Limits for tasks assessed                                        Exercises      General Comments        Pertinent Vitals/Pain Pain Assessment: 0-10 Pain Score: 8  Pain Location: R knee Pain Descriptors / Indicators: Aching;Sore;Operative site guarding Pain Intervention(s): Monitored during session;Premedicated before session;Repositioned;Ice applied      End of Session  in recliner             Time: 1010-1035 PT Time Calculation (min) (ACUTE ONLY): 25 min  Charges:  $Therapeutic Exercise: 8-22 mins                    G Codes:       Rica Koyanagi  PTA [facility]  Acute  Rehab  Pager      [phone]

## 2017-06-30 ENCOUNTER — Encounter: Payer: Self-pay | Admitting: Physical Therapy

## 2017-06-30 ENCOUNTER — Ambulatory Visit: Payer: PPO | Attending: Orthopedic Surgery | Admitting: Physical Therapy

## 2017-06-30 DIAGNOSIS — M6281 Muscle weakness (generalized): Secondary | ICD-10-CM | POA: Diagnosis not present

## 2017-06-30 DIAGNOSIS — R262 Difficulty in walking, not elsewhere classified: Secondary | ICD-10-CM | POA: Diagnosis not present

## 2017-06-30 DIAGNOSIS — M25661 Stiffness of right knee, not elsewhere classified: Secondary | ICD-10-CM | POA: Diagnosis not present

## 2017-06-30 NOTE — Therapy (Signed)
[facility] [address], [place], [address] Phone: [phone]   Fax:  [phone]  Physical Therapy Evaluation  Patient Details  Name: [name] [name] MRN: [id number] Date of Birth: 11/03/1940 Referring Provider: Gaynelle Arabian MD   Encounter Date: 06/30/2017  PT End of Session - 06/30/17 1400    Visit Number  1    Number of Visits  12    Date for PT Re-Evaluation  08/11/17    Authorization Type  1    Authorization Time Period  10 (G codes)    PT Start Time  20-Dec-1301    PT Stop Time  1400    PT Time Calculation (min)  57 min    Equipment Utilized During Treatment  Right knee immobilizer    Activity Tolerance  Patient tolerated treatment well    Behavior During Therapy  [facility] for tasks assessed/performed       Past Medical History:  Diagnosis Date  . Allergy   . Allergy    allergy to spandex "had a bad reaction of blisters when using after a surgery"  . Arthritis    in left knee; having replacement surgery in 12/20/2016  . Bruises easily   . Complication of anesthesia    reports history of "trouble waking up after surgery", mother developed fevers questionable malignant hyperthermia   . COPD (chronic obstructive pulmonary disease) ([place])   . Dysrhythmia    frequent PVC's  . Family history of adverse reaction to anesthesia    mother with history of malignant hypertension  . GERD (gastroesophageal reflux disease)   . Heart palpitations   . Hyperlipidemia   . Hypothyroidism   . Peripheral vascular disease ([place]) 1980   pulmonary embolism  . Pneumonia   . Pulmonary embolism ([place])   . PVC (premature ventricular contraction)   . Shortness of breath dyspnea    with extertion  . Thyroid disease     Past Surgical History:  Procedure Laterality Date  . abdominal surgery    . APPENDECTOMY    . BLEPHAROPLASTY Bilateral 11/2014   left eyelid 08/03/2015 Dr. Vickki Muff  . BREAST CYST ASPIRATION Left   .  CHOLECYSTECTOMY  03/2004  . COLONOSCOPY    . [place]   fertility testing  . EYE SURGERY Bilateral Dec 20, 2013   cataract excision  . KNEE SURGERY Right    knee arthroscopy-2000; Total knee replacement-2002   . LAPAROTOMY  1969   fertility testing  . patelectomy Right December 21, 1970  . Removal of medical meniscus  21-Dec-1970  . UPPER GI ENDOSCOPY      There were no vitals filed for this visit.   Subjective Assessment - 06/30/17 1906    Subjective  pain and stiffness in right knee s/p right TKR revision 06/25/2017    Patient is accompained by:  Family member husband   husband   Pertinent History  right TKR revision 06/25/2017; left TKR 11/04/2016; arthritis; patient enjoys outdoor activities, hiking    Limitations  Sitting;Standing;Walking;House hold activities    Patient Stated Goals  to walk and move without difficulty and not using AD    Currently in Pain?  Yes    Pain Score  3     Pain Location  Knee    Pain Orientation  Right    Pain Descriptors / Indicators  Aching    Pain Type  Surgical pain 06/25/2017   06/25/2017   Pain Onset  1 to 4 weeks ago    Pain Frequency  Intermittent    Aggravating Factors   bending knee, walking, sitting    Pain Relieving Factors  elvation, ice     Effect of Pain on Daily Activities  limited in all activities involving bending, sitting, standing and walking         [facility] PT Assessment - 06/30/17 1315      Assessment   Medical Diagnosis  S/P total right knee replacement revision    Referring Provider  Gaynelle Arabian MD    Onset Date/Surgical Date  06/25/17    Hand Dominance  Right    Prior Therapy  in patient      Precautions   Precautions  Fall      Restrictions   Weight Bearing Restrictions  No      Balance Screen   Has the patient fallen in the past 6 months  No      [place] residence    Living Arrangements  Spouse/significant other    Type of [place] to enter    Entrance Stairs-Number of Steps  [place]  One level    [place] - 2 wheels;Cane - single point;Bedside commode;Shower seat - built in;Toilet riser      Prior Function   Level of Independence  Independent    Vocation  Retired    Leisure  travel, outdoor activities, [employer]   Overall Cognitive Status  Within Functional Limits for tasks assessed      Observation/Other Assessments   Lower Extremity Functional Scale   15/80      Sensation   Additional Comments  impaired around knee      ROM / Strength   AROM / PROM / Strength  AROM;Strength      AROM   Overall AROM Comments  right knee flexion 0-75 degrees with pain limiting further motion      Strength   Overall Strength Comments  WFL left LE  for walking with knee imobilizer, rolling walker      Palpation   Palpation comment  tender medial lateral aspect right knee      Ambulation/Gait   Assistive device  Rolling walker    Gait Pattern  Step-to pattern;Decreased step length - right    Gait Comments  rolling walker, long splint in place, stiff knee              Objective measurements completed on examination: See above findings.                PT Education - 06/30/17 1907    Education provided  Yes    Education Details  POC; elevation, ice, exercises every hour and walk at least 2-3x/day besides average up/down for bathroom etc.    Person(s) Educated  Patient    Methods  Explanation;Demonstration;Verbal cues;Handout    Comprehension  Returned demonstration;Verbal cues required;Verbalized understanding          PT Long Term Goals - 06/30/17 1449      PT LONG TERM GOAL #1   Title  Patient will demonstrate improvement with daily tasks with decreased right knee pain as indicated by LEFS score of 30/80     Baseline  LEFS 15/80     Status  New  Target Date  07/21/17      PT LONG TERM GOAL #2    Title  Patient will demonstrate improvement with daily tasks with decreased right knee pain as indicated by LEFS score of 45/80    Baseline  LEFS 15/80     Status  New    Target Date  08/11/17      PT LONG TERM GOAL #3   Title  Patient will demonstrate improved functional community ambulation and decreased fall risk without AD and 10MW time of 10 seconds or less     Baseline  to be assessed    Status  New    Target Date  08/11/17      PT LONG TERM GOAL #4   Title  Patient will be independent with home program for pain control and progressive exercise for self managment once discharged from physical therapy    Baseline  limited knoweldge and requires moderate cuing for pain control, exercises and progression    Status  New    Target Date  08/11/17      PT LONG TERM GOAL #5   Title  Patient will have AROM right knee flexion to at least 110 or greater to allow improved mobility with transfers and stairs with minimal to no difficulty    Baseline  AROM right knee flexion 0-75 degrees with pain    Status  New    Target Date  07/29/17             Plan - 06/30/17 1908    Clinical Impression Statement  Patient is s/p right TKA/revision x 5 days with decreased ROM, strength and ambulation. Her LEFS score of 15/80 indicates severe self perceived disability. She requires assistance, guidance and cuing to perform exercises with appropriate positiong and technique. She will benefit from physical therapy intervention to achieve goals.     History and Personal Factors relevant to plan of care:  right TKR revision 06/25/2017; left TKR 11/04/2016; arthritis; COPD, PVD; patient enjoys outdoor activities, hiking    Clinical Presentation  Evolving    Clinical Presentation due to:  recent surgery    Clinical Decision Making  Low    Rehab Potential  Good    Clinical Impairments Affecting Rehab Potential  (+)acute condition, family support, motivated, previous TKR(-) arthritis, COPD, PVD    PT  Frequency  3x / week    PT Duration  6 weeks    PT Treatment/Interventions  Gait training;Neuromuscular re-education;Manual techniques;Patient/family education;Therapeutic exercise;Electrical Stimulation;Cryotherapy    PT Next Visit Plan  pain control, ROM exercise, strength exercises    PT Home Exercise Plan  elevation, use of ice, ROM with ball underfoot    Consulted and Agree with Plan of Care  Patient       Patient will benefit from skilled therapeutic intervention in order to improve the following deficits and impairments:  Pain, Increased muscle spasms, Decreased strength, Decreased range of motion, Decreased endurance, Decreased activity tolerance, Difficulty walking, Impaired perceived functional ability  Visit Diagnosis: Stiffness of right knee, not elsewhere classified - Plan: PT plan of care cert/re-cert  Difficulty in walking, not elsewhere classified - Plan: PT plan of care cert/re-cert  Muscle weakness (generalized) - Plan: PT plan of care cert/re-cert  G-Codes - 27/06/23 1405    Functional Assessment Tool Used (Outpatient Only)  LEFS, ROM, strength, gait, pain, clinical judgment    Functional Limitation  Mobility: Walking and moving around    Mobility: Walking and Moving Around Current Status (  [id number])  At least 60 percent but less than 80 percent impaired, limited or restricted    Mobility: Walking and Moving Around Goal Status [phone])  At least 1 percent but less than 20 percent impaired, limited or restricted        Problem List Patient Active Problem List   Diagnosis Date Noted  . Failed total knee arthroplasty ([place]) 06/25/2017  . OA (osteoarthritis) of knee 11/04/2016  . COPD (chronic obstructive pulmonary disease) ([place]) 12/04/2015  . Ovarian mass, left 10/04/2015  . Osteoporosis 09/05/2015  . Skin lesion 08/01/2015  . Chronic infection of sinus 06/30/2015  . H/O varicella 06/30/2015  . Lung nodule, solitary 06/30/2015  . Allergic rhinitis 05/26/2015  .  Bilateral cataracts 05/26/2015  . 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 Hypercholesteremia 05/26/2015  . Hypothyroidism 05/26/2015  . Cramps of lower extremity 05/26/2015  . Hemorrhoids, internal 05/26/2015  . Cervical pain 05/26/2015  . 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 Acquired trigger finger 05/26/2015  . Diverticulitis 05/26/2015    Jomarie Longs PT 07/01/2017, 5:06 PM  [facility] [address] [place], [address] Phone: [phone]   Fax:  [phone]  Name: [name] [name] MRN: [id number] Date of Birth: 1940-12-01

## 2017-07-02 ENCOUNTER — Ambulatory Visit: Payer: PPO | Admitting: Physical Therapy

## 2017-07-02 ENCOUNTER — Encounter: Payer: Self-pay | Admitting: Physical Therapy

## 2017-07-02 DIAGNOSIS — R262 Difficulty in walking, not elsewhere classified: Secondary | ICD-10-CM

## 2017-07-02 DIAGNOSIS — M25661 Stiffness of right knee, not elsewhere classified: Secondary | ICD-10-CM | POA: Diagnosis not present

## 2017-07-02 DIAGNOSIS — M6281 Muscle weakness (generalized): Secondary | ICD-10-CM

## 2017-07-03 ENCOUNTER — Ambulatory Visit: Payer: PPO | Admitting: Physical Therapy

## 2017-07-03 DIAGNOSIS — M25661 Stiffness of right knee, not elsewhere classified: Secondary | ICD-10-CM

## 2017-07-03 DIAGNOSIS — M6281 Muscle weakness (generalized): Secondary | ICD-10-CM

## 2017-07-03 DIAGNOSIS — R262 Difficulty in walking, not elsewhere classified: Secondary | ICD-10-CM

## 2017-07-03 NOTE — Therapy (Signed)
[facility] [place] [facility] [address] [place], [address] Phone: [phone]   Fax:  [phone]  Physical Therapy Treatment  Patient Details  Name: [name] [name] MRN: [id number] Date of Birth: Dec 31, 1940 Referring Provider: Gaynelle Arabian MD   Encounter Date: 07/03/2017  PT End of Session - 07/03/17 1416    Visit Number  3    Number of Visits  18    Date for PT Re-Evaluation  08/11/17    Authorization Type  3    Authorization Time Period  10 (G codes)    PT Start Time  1331    PT Stop Time  1417    PT Time Calculation (min)  46 min    Equipment Utilized During Treatment  Right knee immobilizer    Activity Tolerance  Patient tolerated treatment well;Patient limited by pain;Other (comment) increased swelling, warmth right knee    Behavior During Therapy  WFL for tasks assessed/performed       Past Medical History:  Diagnosis Date  . Allergy   . Allergy    allergy to spandex "had a bad reaction of blisters when using after a surgery"  . 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 Bruises easily   . Complication of anesthesia    reports history of "trouble waking up after surgery", mother developed fevers questionable malignant hyperthermia   . COPD (chronic obstructive pulmonary disease) ([place])   . Dysrhythmia    frequent PVC's  . Family history of adverse reaction to anesthesia    mother with history of malignant hypertension  . GERD (gastroesophageal reflux disease)   . Heart palpitations   . Hyperlipidemia   . Hypothyroidism   . Peripheral vascular disease ([place]) 1980   pulmonary embolism  . Pneumonia   . Pulmonary embolism ([place])   . PVC (premature ventricular contraction)   . Shortness of breath dyspnea    with extertion  . Thyroid disease     Past Surgical History:  Procedure Laterality Date  . abdominal surgery    . APPENDECTOMY    . BLEPHAROPLASTY Bilateral 11/2014   left eyelid 08/03/2015 Dr. Vickki Muff  . BREAST CYST ASPIRATION Left   . CHOLECYSTECTOMY  03/2004  . COLONOSCOPY    . [place]   fertility testing  . EYE SURGERY Bilateral 2015   cataract excision  . KNEE SURGERY Right    knee arthroscopy-2000; Total knee replacement-2002   . LAPAROTOMY  1969   fertility testing  . patelectomy Right 1972  . Removal of medical meniscus  1972  . UPPER GI ENDOSCOPY      There were no vitals filed for this visit.  Subjective Assessment - 07/03/17 1333    Subjective  Pain is 3/10 with increased swelling with being up and moving    Patient is accompained by:  Family member husband    Pertinent History  right TKR revision 06/25/2017; left TKR 11/04/2016; arthritis; patient enjoys outdoor activities, hiking    Limitations  Sitting;Standing;Walking;House hold activities    Patient Stated Goals  to walk and move without difficulty and not using AD    Pain Onset  1 to 4 weeks ago       Objective:  AAROM right knee 60 degrees up to 65 degrees with treatment  Observation: moderate swelling and increased warmth right knee, slightly improved from previous session, increased redness with standing, sitting dependent positions, +  ecchymosis posterior knee up back of thigh ~4- 5"   Treatment:  Therapeutic exercise: patient performed with assistance, verbal and tactile cues of therapist:  Supine lying hip/kne flexion, quad sets, using ball for ROM with assistance Elevation and ice to right knee on wedge  x15 min. At end of session,goals:  decreased swelling, redness and improved soft tissue mobility  Patient response to treatment: patient demonstrated improved flexibility right knee with exercises with moderate assistance and VC for correct alignment. Decreased swelling and decreased warmth by >50% with elevation and ice. Improved motor control with repetition and cuing    PT Education - 07/03/17 1340    Education provided  Yes     Education Details  exercise instruction, continue with elevation and ice to control pain, swelling     Person(s) Educated  Patient    Methods  Explanation;Demonstration;Verbal cues    Comprehension  Verbalized understanding;Verbal cues required          PT Long Term Goals - 06/30/17 1449      PT LONG TERM GOAL #1   Title  Patient will demonstrate improvement with daily tasks with decreased right knee pain as indicated by LEFS score of 30/80     Baseline  LEFS 15/80     Status  New    Target Date  07/21/17      PT LONG TERM GOAL #2   Title  Patient will demonstrate improvement with daily tasks with decreased right knee pain as indicated by LEFS score of 45/80    Baseline  LEFS 15/80     Status  New    Target Date  08/11/17      PT LONG TERM GOAL #3   Title  Patient will demonstrate improved functional community ambulation and decreased fall risk without AD and 10MW time of 10 seconds or less     Baseline  to be assessed    Status  New    Target Date  08/11/17      PT LONG TERM GOAL #4   Title  Patient will be independent with home program for pain control and progressive exercise for self managment once discharged from physical therapy    Baseline  limited knoweldge and requires moderate cuing for pain control, exercises and progression    Status  New    Target Date  08/11/17      PT LONG TERM GOAL #5   Title  Patient will have AROM right knee flexion to at least 110 or greater to allow improved mobility with transfers and stairs with minimal to no difficulty    Baseline  AROM right knee flexion 0-75 degrees with pain    Status  New    Target Date  07/29/17            Plan - 07/03/17 1437    Clinical Impression Statement  Patient continues with inflammation and swelling as primary limitations to ROM, function with dailyt tasks. she will focus on elevation and ice over ther weekend and wil monitor symptoms.     Rehab Potential  Good    Clinical Impairments Affecting  Rehab Potential  (+)acute condition, family support, motivated, previous TKR(-) arthritis, COPD, PVD    PT Frequency  3x / week    PT Duration  6 weeks    PT Treatment/Interventions  Gait training;Neuromuscular re-education;Manual techniques;Patient/family education;Therapeutic exercise;Electrical Stimulation;Cryotherapy    PT Next Visit Plan  pain control, ROM exercise, strength exercises    PT Home Exercise  Plan  elevation, use of ice, ROM with ball underfoot       Patient will benefit from skilled therapeutic intervention in order to improve the following deficits and impairments:  Pain, Increased muscle spasms, Decreased strength, Decreased range of motion, Decreased endurance, Decreased activity tolerance, Difficulty walking, Impaired perceived functional ability  Visit Diagnosis: Stiffness of right knee, not elsewhere classified  Difficulty in walking, not elsewhere classified  Muscle weakness (generalized)     Problem List Patient Active Problem List   Diagnosis Date Noted  . Failed total knee arthroplasty ([place]) 06/25/2017  . OA (osteoarthritis) of knee 11/04/2016  . COPD (chronic obstructive pulmonary disease) ([place]) 12/04/2015  . Ovarian mass, left 10/04/2015  . Osteoporosis 09/05/2015  . Skin lesion 08/01/2015  . Chronic infection of sinus 06/30/2015  . H/O varicella 06/30/2015  . Lung nodule, solitary 06/30/2015  . Allergic rhinitis 05/26/2015  . Bilateral cataracts 05/26/2015  . 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 Hypercholesteremia 05/26/2015  . Hypothyroidism 05/26/2015  . Cramps of lower extremity 05/26/2015  . Hemorrhoids, internal 05/26/2015  . Cervical pain 05/26/2015  . 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 Acquired trigger finger 05/26/2015  . Diverticulitis 05/26/2015    Jomarie Longs PT 07/04/2017, 8:50 AM  [place] [facility] [address], [place], [address] Phone:  [phone]   Fax:  [phone]  Name: CINDERELLA CHRISTOFFERSEN MRN: [id number] Date of Birth: 06-02-1941

## 2017-07-03 NOTE — Therapy (Signed)
[facility] [address] [place], [address] Phone: [phone]   Fax:  [phone]  Physical Therapy Treatment  Patient Details  Name: [name] [name] MRN: [id number] Date of Birth: 05/25/1941 Referring Provider: Gaynelle Arabian MD   Encounter Date: 07/02/2017  PT End of Session - 07/02/17 1303    Visit Number  2    Number of Visits  18    Date for PT Re-Evaluation  08/11/17    Authorization Type  2    Authorization Time Period  10 (G codes)    PT Start Time  1300    PT Stop Time  1340    PT Time Calculation (min)  40 min    Equipment Utilized During Treatment  Right knee immobilizer    Activity Tolerance  Patient tolerated treatment well;Patient limited by pain;Other (comment) increased swelling, warmth right knee   increased swelling, warmth right knee   Behavior During Therapy  WFL for tasks assessed/performed       Past Medical History:  Diagnosis Date  . Allergy   . Allergy    allergy to spandex "had a bad reaction of blisters when using after a surgery"  . 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 Bruises easily   . Complication of anesthesia    reports history of "trouble waking up after surgery", mother developed fevers questionable malignant hyperthermia   . COPD (chronic obstructive pulmonary disease) ([place])   . Dysrhythmia    frequent PVC's  . Family history of adverse reaction to anesthesia    mother with history of malignant hypertension  . GERD (gastroesophageal reflux disease)   . Heart palpitations   . Hyperlipidemia   . Hypothyroidism   . Peripheral vascular disease ([place]) 1980   pulmonary embolism  . Pneumonia   . Pulmonary embolism ([place])   . PVC (premature ventricular contraction)   . Shortness of breath dyspnea    with extertion  . Thyroid disease     Past Surgical History:  Procedure Laterality Date  . abdominal surgery    . APPENDECTOMY    .  BLEPHAROPLASTY Bilateral 11/2014   left eyelid 08/03/2015 Dr. Vickki Muff  . BREAST CYST ASPIRATION Left   . CHOLECYSTECTOMY  03/2004  . COLONOSCOPY    . [place]   fertility testing  . EYE SURGERY Bilateral 2015   cataract excision  . KNEE SURGERY Right    knee arthroscopy-2000; Total knee replacement-2002   . LAPAROTOMY  1969   fertility testing  . patelectomy Right 1972  . Removal of medical meniscus  1972  . UPPER GI ENDOSCOPY      There were no vitals filed for this visit.  Subjective Assessment - 07/02/17 1302    Subjective  Pain about a 1.5/10  today. pain control with medication ice and elevation. walking around the house without the [place].     Patient is accompained by:  Family member husband   husband   Pertinent History  right TKR revision 06/25/2017; left TKR 11/04/2016; arthritis; patient enjoys outdoor activities, hiking    Limitations  Sitting;Standing;Walking;House hold activities    Patient Stated Goals  to walk and move without difficulty and not using AD    Currently in Pain?  Yes    Pain Score  -- 1.5   1.5   Pain Location  Knee    Pain Orientation  Right  Pain Descriptors / Indicators  Aching;Tightness    Pain Type  Surgical pain 06/25/2017   06/25/2017   Pain Onset  1 to 4 weeks ago    Pain Frequency  Intermittent       Objective:  Observation/palpation: right knee moderate swelling, redness around incision with increased warmth as compared to previous session; would dressing clean and dry Gait: ambulating with rolling walker and KI in place right LE AAROM: right knee flexion to 60 degrees with pain,swelling limiting further motion  Treatment:  Manual therapy: 10 min. Goal: pain, spasms, improve ROM right LE STM performed to quadriceps, hamstring and proximal calf muscles with patient supine lying, prior to exercise: superficial techniques  Therapeutic exercise: patient performed with guidance, demonstration, verbal and  tactile cues of therapist: goals: improve function, LEFS, ROM, strength, independent with home exercise program Supine lying: Assisted hip/knee flexion and extension multiple repetitions with isometric hip extension performed and isometric knee flexion within available range  Patient response to treatment: patient demonstrated improved technique with exercises with minimal VC for correct alignment.  Patient with imrpoved soft tissue elasticity by  30%  following STM. Improved motor control with repetition and cuing. Continued with limited ROM to 65 degrees flexion.     PT Education - 07/02/17 1310    Education provided  Yes    Education Details  HEP continue with rest/elevation and ice to control swelling and increased warmth and exercise with ROM every hour    Person(s) Educated  Patient    Methods  Explanation    Comprehension  Verbalized understanding          PT Long Term Goals - 06/30/17 1449      PT LONG TERM GOAL #1   Title  Patient will demonstrate improvement with daily tasks with decreased right knee pain as indicated by LEFS score of 30/80     Baseline  LEFS 15/80     Status  New    Target Date  07/21/17      PT LONG TERM GOAL #2   Title  Patient will demonstrate improvement with daily tasks with decreased right knee pain as indicated by LEFS score of 45/80    Baseline  LEFS 15/80     Status  New    Target Date  08/11/17      PT LONG TERM GOAL #3   Title  Patient will demonstrate improved functional community ambulation and decreased fall risk without AD and 10MW time of 10 seconds or less     Baseline  to be assessed    Status  New    Target Date  08/11/17      PT LONG TERM GOAL #4   Title  Patient will be independent with home program for pain control and progressive exercise for self managment once discharged from physical therapy    Baseline  limited knoweldge and requires moderate cuing for pain control, exercises and progression    Status  New    Target  Date  08/11/17      PT LONG TERM GOAL #5   Title  Patient will have AROM right knee flexion to at least 110 or greater to allow improved mobility with transfers and stairs with minimal to no difficulty    Baseline  AROM right knee flexion 0-75 degrees with pain    Status  New    Target Date  07/29/17            Plan - 07/02/17 1303  Clinical Impression Statement  Pateint demonstrates increased swelling, warmth in knee that is limiting motion and ability to exercise today. She understands the need to elevate and ice more often to control this in order to allow increased ROM and function. She is progressing with less use of KI at home.     Rehab Potential  Good    Clinical Impairments Affecting Rehab Potential  (+)acute condition, family support, motivated, previous TKR(-) arthritis, COPD, PVD    PT Frequency  3x / week    PT Duration  6 weeks    PT Treatment/Interventions  Gait training;Neuromuscular re-education;Manual techniques;Patient/family education;Therapeutic exercise;Electrical Stimulation;Cryotherapy    PT Next Visit Plan  pain control, ROM exercise, strength exercises    PT Home Exercise Plan  elevation, use of ice, ROM with ball underfoot       Patient will benefit from skilled therapeutic intervention in order to improve the following deficits and impairments:  Pain, Increased muscle spasms, Decreased strength, Decreased range of motion, Decreased endurance, Decreased activity tolerance, Difficulty walking, Impaired perceived functional ability  Visit Diagnosis: Stiffness of right knee, not elsewhere classified  Difficulty in walking, not elsewhere classified  Muscle weakness (generalized)     Problem List Patient Active Problem List   Diagnosis Date Noted  . 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 OA (osteoarthritis) of knee 11/04/2016  . COPD (chronic obstructive pulmonary disease) ([place]) 12/04/2015  . Ovarian mass, left 10/04/2015  .  Osteoporosis 09/05/2015  . Skin lesion 08/01/2015  . Chronic infection of sinus 06/30/2015  . H/O varicella 06/30/2015  . Lung nodule, solitary 06/30/2015  . Allergic rhinitis 05/26/2015  . Bilateral cataracts 05/26/2015  . 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 Hypercholesteremia 05/26/2015  . Hypothyroidism 05/26/2015  . Cramps of lower extremity 05/26/2015  . Hemorrhoids, internal 05/26/2015  . Cervical pain 05/26/2015  . 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 Acquired trigger finger 05/26/2015  . Diverticulitis 05/26/2015    Jomarie Longs PT 07/03/2017, 9:37 AM  [facility] [address], [place], [address] Phone: [phone]   Fax:  [phone]  Name: [name] [name] MRN: [id number] Date of Birth: [birth year]

## 2017-07-07 ENCOUNTER — Ambulatory Visit: Payer: PPO | Admitting: Physical Therapy

## 2017-07-07 ENCOUNTER — Encounter: Payer: Self-pay | Admitting: Physical Therapy

## 2017-07-07 ENCOUNTER — Other Ambulatory Visit: Payer: Self-pay

## 2017-07-07 DIAGNOSIS — R262 Difficulty in walking, not elsewhere classified: Secondary | ICD-10-CM

## 2017-07-07 DIAGNOSIS — M6281 Muscle weakness (generalized): Secondary | ICD-10-CM

## 2017-07-07 DIAGNOSIS — M25661 Stiffness of right knee, not elsewhere classified: Secondary | ICD-10-CM

## 2017-07-07 NOTE — Therapy (Signed)
[facility] [address], [place], [address] Phone: [phone]   Fax:  [phone]  Physical Therapy Treatment  Patient Details  Name: [name] [name] MRN: [id number] Date of Birth: [birth year] Referring Provider: Gaynelle Arabian MD   Encounter Date: 07/07/2017  PT End of Session - 07/07/17 1442    Visit Number  4    Number of Visits  18    Date for PT Re-Evaluation  08/11/17    Authorization Type  4    Authorization Time Period  10 (G codes)    PT Start Time  1346    PT Stop Time  1438    PT Time Calculation (min)  52 min    Equipment Utilized During Treatment  Right knee immobilizer    Activity Tolerance  Patient tolerated treatment well;Patient limited by pain;Other (comment) increased swelling, warmth right knee    Behavior During Therapy  WFL for tasks assessed/performed       Past Medical History:  Diagnosis Date  . Allergy   . Allergy    allergy to spandex "had a bad reaction of blisters when using after a surgery"  . 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 Bruises easily   . Complication of anesthesia    reports history of "trouble waking up after surgery", mother developed fevers questionable malignant hyperthermia   . COPD (chronic obstructive pulmonary disease) ([place])   . Dysrhythmia    frequent PVC's  . Family history of adverse reaction to anesthesia    mother with history of malignant hypertension  . GERD (gastroesophageal reflux disease)   . Heart palpitations   . Hyperlipidemia   . Hypothyroidism   . Peripheral vascular disease ([place]) 1980   pulmonary embolism  . Pneumonia   . Pulmonary embolism ([place])   . PVC (premature ventricular contraction)   . Shortness of breath dyspnea    with extertion  . Thyroid disease     Past Surgical History:  Procedure Laterality Date  . abdominal surgery    . APPENDECTOMY    . BLEPHAROPLASTY Bilateral 11/2014    left eyelid 08/03/2015 Dr. Vickki Muff  . BREAST CYST ASPIRATION Left   . CHOLECYSTECTOMY  03/2004  . COLONOSCOPY    . [place]   fertility testing  . EYE SURGERY Bilateral 2015   cataract excision  . KNEE SURGERY Right    knee arthroscopy-2000; Total knee replacement-2002   . LAPAROTOMY  1969   fertility testing  . patelectomy Right 1972  . Removal of medical meniscus  1972  . UPPER GI ENDOSCOPY      There were no vitals filed for this visit.  Subjective Assessment - 07/07/17 1355    Subjective  Patient reports she feels her kne is more limited in ROM and feels inflammation is the chief limitation.     Patient is accompained by:  Family member husband    Pertinent History  right TKR revision 06/25/2017; left TKR 11/04/2016; arthritis; patient enjoys outdoor activities, hiking    Limitations  Sitting;Standing;Walking;House hold activities    Patient Stated Goals  to walk and move without difficulty and not using AD    Currently in Pain?  Other (Comment) stiffness, inflammaiton of right knee     Pain Onset  1 to 4 weeks ago       Objective:  AAROM right knee 50 degrees on arrival and  up to 65 degrees with treatment  Observation: moderate swelling and increased warmth right knee, slightly improved from previous session, increased redness with standing, sitting dependent positions; ecchymosis much improved from previous session posterior aspect of knee/thigh Gait: ambulating with rolling walker, without KI Palpation: very tender with spasm along right quadriceps and hamstring muscles  Treatment:  Manual therapy: 10 min prior to exercise; goal: pain, spasms, improve ROM STM performed to right LE quadriceps/hamstring muscles with patient supine lying; superficial techniques with instruction to patient and husband to perform light STM at home for relaxing muscles  Therapeutic exercise: patient performed with assistance, verbal and tactile cues of therapist:   Supine lying hip/knee flexion, quad sets with assistance of therapist; isometric hamstring 3 sets 5 reps alternating with knee extension  Elevation and ice to right knee on wedge  x15 min. At end of session,goals:  decreased swelling, redness and improved soft tissue mobility  Patient response to treatment: Patient with significant improvement with decreased tenderness in muscles and able to perform exercises with increased ROM by at least 15 degrees.       PT Education - 07/07/17 1430    Education provided  Yes    Education Details  exercise instruction, home program to continue for inflammation and swelling    Person(s) Educated  Patient    Methods  Explanation;Demonstration;Verbal cues    Comprehension  Verbalized understanding;Returned demonstration;Verbal cues required          PT Long Term Goals - 06/30/17 1449      PT LONG TERM GOAL #1   Title  Patient will demonstrate improvement with daily tasks with decreased right knee pain as indicated by LEFS score of 30/80     Baseline  LEFS 15/80     Status  New    Target Date  07/21/17      PT LONG TERM GOAL #2   Title  Patient will demonstrate improvement with daily tasks with decreased right knee pain as indicated by LEFS score of 45/80    Baseline  LEFS 15/80     Status  New    Target Date  08/11/17      PT LONG TERM GOAL #3   Title  Patient will demonstrate improved functional community ambulation and decreased fall risk without AD and 10MW time of 10 seconds or less     Baseline  to be assessed    Status  New    Target Date  08/11/17      PT LONG TERM GOAL #4   Title  Patient will be independent with home program for pain control and progressive exercise for self managment once discharged from physical therapy    Baseline  limited knoweldge and requires moderate cuing for pain control, exercises and progression    Status  New    Target Date  08/11/17      PT LONG TERM GOAL #5   Title  Patient will have AROM  right knee flexion to at least 110 or greater to allow improved mobility with transfers and stairs with minimal to no difficulty    Baseline  AROM right knee flexion 0-75 degrees with pain    Status  New    Target Date  07/29/17            Plan - 07/07/17 1440    Clinical Impression Statement  Patient continues with inflammaiton and swelling as primary limitations to gaining ROM, function with daily tasks. She will continue to benefit  from physical therapy intervention to further improve function with daily tasks and ambulation.     Rehab Potential  Good    Clinical Impairments Affecting Rehab Potential  (+)acute condition, family support, motivated, previous TKR(-) arthritis, COPD, PVD    PT Frequency  3x / week    PT Duration  6 weeks    PT Treatment/Interventions  Gait training;Neuromuscular re-education;Manual techniques;Patient/family education;Therapeutic exercise;Electrical Stimulation;Cryotherapy    PT Next Visit Plan  pain control, ROM exercise, strength exercises    PT Home Exercise Plan  elevation, use of ice, ROM for knee extension, flexion       Patient will benefit from skilled therapeutic intervention in order to improve the following deficits and impairments:  Pain, Increased muscle spasms, Decreased strength, Decreased range of motion, Decreased endurance, Decreased activity tolerance, Difficulty walking, Impaired perceived functional ability  Visit Diagnosis: Stiffness of right knee, not elsewhere classified  Difficulty in walking, not elsewhere classified  Muscle weakness (generalized)     Problem List Patient Active Problem List   Diagnosis Date Noted  . 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 OA (osteoarthritis) of knee 11/04/2016  . COPD (chronic obstructive pulmonary disease) ([place]) 12/04/2015  . Ovarian mass, left 10/04/2015  . Osteoporosis 09/05/2015  . Skin lesion 08/01/2015  . Chronic infection of sinus 06/30/2015  . H/O varicella  06/30/2015  . Lung nodule, solitary 06/30/2015  . Allergic rhinitis 05/26/2015  . Bilateral cataracts 05/26/2015  . 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 Hypercholesteremia 05/26/2015  . Hypothyroidism 05/26/2015  . Cramps of lower extremity 05/26/2015  . Hemorrhoids, internal 05/26/2015  . Cervical pain 05/26/2015  . 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 Acquired trigger finger 05/26/2015  . Diverticulitis 05/26/2015    Jomarie Longs PT 07/08/2017, 3:15 PM  [facility] [address] [place], [address] Phone: [phone]   Fax:  [phone]  Name: [name] [name] MRN: [id number] Date of Birth: Jun 21, 1941

## 2017-07-09 ENCOUNTER — Encounter: Payer: Self-pay | Admitting: Physical Therapy

## 2017-07-09 ENCOUNTER — Other Ambulatory Visit: Payer: Self-pay

## 2017-07-09 ENCOUNTER — Ambulatory Visit: Payer: PPO | Admitting: Physical Therapy

## 2017-07-09 DIAGNOSIS — R262 Difficulty in walking, not elsewhere classified: Secondary | ICD-10-CM

## 2017-07-09 DIAGNOSIS — M25661 Stiffness of right knee, not elsewhere classified: Secondary | ICD-10-CM

## 2017-07-09 DIAGNOSIS — M6281 Muscle weakness (generalized): Secondary | ICD-10-CM

## 2017-07-09 NOTE — Therapy (Signed)
[facility] [address], [place], [address] Phone: [phone]   Fax:  [phone]  Physical Therapy Treatment  Patient Details  Name: [name] [name] MRN: [id number] Date of Birth: 09/21/1940 Referring Provider: Gaynelle Arabian MD   Encounter Date: 07/09/2017  PT End of Session - 07/09/17 1431    Visit Number  5    Number of Visits  18    Date for PT Re-Evaluation  08/11/17    Authorization Type  5    Authorization Time Period  10 (G codes)    PT Start Time  4580    PT Stop Time  1420    PT Time Calculation (min)  47 min    Equipment Utilized During Treatment  Right knee immobilizer    Activity Tolerance  Patient tolerated treatment well;Patient limited by pain;Other (comment) increased swelling, warmth right knee    Behavior During Therapy  WFL for tasks assessed/performed       Past Medical History:  Diagnosis Date  . Allergy   . Allergy    allergy to spandex "had a bad reaction of blisters when using after a surgery"  . 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 Bruises easily   . Complication of anesthesia    reports history of "trouble waking up after surgery", mother developed fevers questionable malignant hyperthermia   . COPD (chronic obstructive pulmonary disease) ([place])   . Dysrhythmia    frequent PVC's  . Family history of adverse reaction to anesthesia    mother with history of malignant hypertension  . GERD (gastroesophageal reflux disease)   . Heart palpitations   . Hyperlipidemia   . Hypothyroidism   . Peripheral vascular disease ([place]) 1980   pulmonary embolism  . Pneumonia   . Pulmonary embolism ([place])   . PVC (premature ventricular contraction)   . Shortness of breath dyspnea    with extertion  . Thyroid disease     Past Surgical History:  Procedure Laterality Date  . abdominal surgery    . APPENDECTOMY    . BLEPHAROPLASTY Bilateral 11/2014   left eyelid 08/03/2015 Dr. Vickki Muff  . BREAST CYST ASPIRATION Left   . CHOLECYSTECTOMY  03/2004  . COLONOSCOPY    . [place]   fertility testing  . EYE SURGERY Bilateral 2015   cataract excision  . KNEE SURGERY Right    knee arthroscopy-2000; Total knee replacement-2002   . LAPAROTOMY  1969   fertility testing  . patelectomy Right 1972  . Removal of medical meniscus  1972  . TOTAL KNEE ARTHROPLASTY Left 11/04/2016   Procedure: LEFT TOTAL KNEE ARTHROPLASTY;  Surgeon: Gaynelle Arabian, MD;  Location: [facility];  Service: Orthopedics;  Laterality: Left;  requests 61mins  . TOTAL KNEE REVISION Right 06/25/2017   Procedure: RIGHT TOTAL KNEE REVISION;  Surgeon: Gaynelle Arabian, MD;  Location: [facility];  Service: Orthopedics;  Laterality: Right;  . UPPER GI ENDOSCOPY      There were no vitals filed for this visit.  Subjective Assessment - 07/09/17 1335    Subjective  Patient reports being seen by MD and is to improve ROM to 90 degrees within the next 3 weeks. She is also able to resume Meloxicam next week thursday.     Patient is accompained by:  Family member husband    Pertinent History  right TKR revision 06/25/2017; left TKR 11/04/2016; arthritis; patient  enjoys outdoor activities, hiking    Limitations  Sitting;Standing;Walking;House hold activities    Patient Stated Goals  to walk and move without difficulty and not using AD    Currently in Pain?  Other (Comment) tightness, inflammation in right knee       Objective: AAROM: pre treatment: 60 degrees, up to 65 degrees with treatment  Observation: moderate swelling and increased warmth right knee Gait: ambulating with rolling walker, decreased hip/knee flexion right LE with swing through Palpation: decreased soft tissue elasticity with palpable spasms along quadriceps and hamstring muscles right LE  Treatment:  Manual therapy: 10 min prior to exercise; goal: pain, spasms, improve ROM STM performed to right LE  quadriceps/hamstring muscles with patient supine lying; superficial techniques with instruction to patient and husband to perform light STM at home for relaxing muscles  Therapeutic exercise: patient performed with assistance, verbal and tactile cues of therapist: Supine lying hip/knee flexion, quad sets with assistance of therapist; isometric hamstring 3 sets 5 reps alternating with knee extension  Elevation and iceto right knee on wedgex15 min. At end of session,goals:decreased swelling, redness and improved soft tissue mobility  Patient response to treatment: Improved flexibility, decreased effort to perform exercises with moderate cuing and assistance of therapist. Improved ROM from 60 to 65 degrees with less tenderness and pain reported.        PT Education - 07/09/17 1432    Education provided  Yes    Education Details  exercise instruction, home program re assessed    Person(s) Educated  Patient    Methods  Explanation    Comprehension  Verbalized understanding          PT Long Term Goals - 06/30/17 1449      PT LONG TERM GOAL #1   Title  Patient will demonstrate improvement with daily tasks with decreased right knee pain as indicated by LEFS score of 30/80     Baseline  LEFS 15/80     Status  New    Target Date  07/21/17      PT LONG TERM GOAL #2   Title  Patient will demonstrate improvement with daily tasks with decreased right knee pain as indicated by LEFS score of 45/80    Baseline  LEFS 15/80     Status  New    Target Date  08/11/17      PT LONG TERM GOAL #3   Title  Patient will demonstrate improved functional community ambulation and decreased fall risk without AD and 10MW time of 10 seconds or less     Baseline  to be assessed    Status  New    Target Date  08/11/17      PT LONG TERM GOAL #4   Title  Patient will be independent with home program for pain control and progressive exercise for self managment once discharged from physical therapy     Baseline  limited knoweldge and requires moderate cuing for pain control, exercises and progression    Status  New    Target Date  08/11/17      PT LONG TERM GOAL #5   Title  Patient will have AROM right knee flexion to at least 110 or greater to allow improved mobility with transfers and stairs with minimal to no difficulty    Baseline  AROM right knee flexion 0-75 degrees with pain    Status  New    Target Date  07/29/17  Plan - 07/09/17 1419    Clinical Impression Statement  Patient demonstrated improved flexibility in her right knee following repetition and cuing. She continues with limitations of ROM due to inflammation and swelling.     Rehab Potential  Good    Clinical Impairments Affecting Rehab Potential  (+)acute condition, family support, motivated, previous TKR(-) arthritis, COPD, PVD    PT Frequency  3x / week    PT Duration  6 weeks    PT Treatment/Interventions  Gait training;Neuromuscular re-education;Manual techniques;Patient/family education;Therapeutic exercise;Electrical Stimulation;Cryotherapy    PT Next Visit Plan  pain control, ROM exercise, strength exercises    PT Home Exercise Plan  elevation, use of ice, ROM for knee extension, flexion       Patient will benefit from skilled therapeutic intervention in order to improve the following deficits and impairments:  Pain, Increased muscle spasms, Decreased strength, Decreased range of motion, Decreased endurance, Decreased activity tolerance, Difficulty walking, Impaired perceived functional ability  Visit Diagnosis: Stiffness of right knee, not elsewhere classified  Difficulty in walking, not elsewhere classified  Muscle weakness (generalized)     Problem List Patient Active Problem List   Diagnosis Date Noted  . 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 OA (osteoarthritis) of knee 11/04/2016  . COPD (chronic obstructive pulmonary disease) ([place]) 12/04/2015  . Ovarian mass, left  10/04/2015  . Osteoporosis 09/05/2015  . Skin lesion 08/01/2015  . Chronic infection of sinus 06/30/2015  . H/O varicella 06/30/2015  . Lung nodule, solitary 06/30/2015  . Allergic rhinitis 05/26/2015  . Bilateral cataracts 05/26/2015  . 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 Hypercholesteremia 05/26/2015  . Hypothyroidism 05/26/2015  . Cramps of lower extremity 05/26/2015  . Hemorrhoids, internal 05/26/2015  . Cervical pain 05/26/2015  . 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 Acquired trigger finger 05/26/2015  . Diverticulitis 05/26/2015    Jomarie Longs PT 07/10/2017, 12:27 PM  [facility] [address], [place], [address] Phone: [phone]   Fax:  [phone]  Name: [name] [name] MRN: [id number] Date of Birth: 1941/03/30

## 2017-07-10 ENCOUNTER — Ambulatory Visit: Payer: PPO | Admitting: Physical Therapy

## 2017-07-10 ENCOUNTER — Encounter: Payer: Self-pay | Admitting: Physical Therapy

## 2017-07-10 ENCOUNTER — Other Ambulatory Visit: Payer: Self-pay

## 2017-07-10 DIAGNOSIS — M25661 Stiffness of right knee, not elsewhere classified: Secondary | ICD-10-CM

## 2017-07-10 DIAGNOSIS — M6281 Muscle weakness (generalized): Secondary | ICD-10-CM

## 2017-07-10 DIAGNOSIS — R262 Difficulty in walking, not elsewhere classified: Secondary | ICD-10-CM

## 2017-07-10 NOTE — Therapy (Signed)
[facility] [address], [place], [address] Phone: [phone]   Fax:  [phone]  Physical Therapy Treatment  Patient Details  Name: [name] [name] MRN: [id number] Date of Birth: Feb 20, 1941 Referring Provider: Gaynelle Arabian MD   Encounter Date: 07/10/2017  PT End of Session - 07/10/17 1452    Visit Number  6    Number of Visits  18    Date for PT Re-Evaluation  08/11/17    Authorization Type  6    Authorization Time Period  10 (G codes)    PT Start Time  4259    PT Stop Time  1445    PT Time Calculation (min)  50 min    Equipment Utilized During Treatment  Right knee immobilizer    Activity Tolerance  Patient tolerated treatment well;Patient limited by pain;Other (comment) increased swelling, warmth right knee    Behavior During Therapy  WFL for tasks assessed/performed       Past Medical History:  Diagnosis Date  . Allergy   . Allergy    allergy to spandex "had a bad reaction of blisters when using after a surgery"  . 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 Bruises easily   . Complication of anesthesia    reports history of "trouble waking up after surgery", mother developed fevers questionable malignant hyperthermia   . COPD (chronic obstructive pulmonary disease) ([place])   . Dysrhythmia    frequent PVC's  . Family history of adverse reaction to anesthesia    mother with history of malignant hypertension  . GERD (gastroesophageal reflux disease)   . Heart palpitations   . Hyperlipidemia   . Hypothyroidism   . Peripheral vascular disease ([place]) 1980   pulmonary embolism  . Pneumonia   . Pulmonary embolism ([place])   . PVC (premature ventricular contraction)   . Shortness of breath dyspnea    with extertion  . Thyroid disease     Past Surgical History:  Procedure Laterality Date  . abdominal surgery    . APPENDECTOMY    . BLEPHAROPLASTY Bilateral 11/2014    left eyelid 08/03/2015 Dr. Vickki Muff  . BREAST CYST ASPIRATION Left   . CHOLECYSTECTOMY  03/2004  . COLONOSCOPY    . [place]   fertility testing  . EYE SURGERY Bilateral 2015   cataract excision  . KNEE SURGERY Right    knee arthroscopy-2000; Total knee replacement-2002   . LAPAROTOMY  1969   fertility testing  . patelectomy Right 1972  . Removal of medical meniscus  1972  . TOTAL KNEE ARTHROPLASTY Left 11/04/2016   Procedure: LEFT TOTAL KNEE ARTHROPLASTY;  Surgeon: Gaynelle Arabian, MD;  [facility];  Service: Orthopedics;  Laterality: Left;  requests 86mins  . TOTAL KNEE REVISION Right 06/25/2017   Procedure: RIGHT TOTAL KNEE REVISION;  Surgeon: Gaynelle Arabian, MD;  [facility];  Service: Orthopedics;  Laterality: Right;  . UPPER GI ENDOSCOPY      There were no vitals filed for this visit.  Subjective Assessment - 07/10/17 1441    Subjective  Patient reports she is exercising at home with elevation and ice.     Patient is accompained by:  Family member husband    Pertinent History  right TKR revision 06/25/2017; left TKR 11/04/2016; arthritis; patient enjoys outdoor activities, hiking    Limitations  Sitting;Standing;Walking;House hold activities    Patient Stated  Goals  to walk and move without difficulty and not using AD    Currently in Pain?  Other (Comment) tightness and swelling right knee          Objective: AAROM: pre treatment: 60 degrees, up to 62 degrees with patient at edge of treatment table Observation: moderate swelling and increased warmth right knee Gait: ambulating with rolling walker, decreased hip/knee flexion right LE with swing through, improving pattern with concentration Palpation: decreased soft tissue elasticity with palpable spasms along quadriceps and hamstring muscles right LE  Treatment: Manual therapy: 10 min prior to exercise; goal: pain, spasms, improve ROM STM performed to right LE quadriceps/hamstring  muscles with patient supine lying; superficial techniques with instruction to patient and husband to perform light STM at home for relaxing muscles Therapeutic exercise:patient performed with assistance, verbal and tactile cues of therapist: Supine lying hip/kneeflexion, quad sets with assistance of therapist; isometric hamstring 3 sets 5 reps alternating with knee extension Elevation and iceto right knee on wedgex15 min. At end of session,goals:decreased swelling, warmth and redness with improved soft tissue mobility; no adverse reaction noted following ice  Patient response to treatment:Iimrpoved gait pattern with Improved flexibility. Improved ability to perform exercise with less difficulty and more isolated hip/knee flexion with minimal cuing and assistance to maintain hip alignment.       PT Education - 07/09/17 1432    Education provided  Yes    Education Details  exercise instruction, home program re assessed    Person(s) Educated  Patient    Methods  Explanation    Comprehension  Verbalized understanding          PT Long Term Goals - 06/30/17 1449      PT LONG TERM GOAL #1   Title  Patient will demonstrate improvement with daily tasks with decreased right knee pain as indicated by LEFS score of 30/80     Baseline  LEFS 15/80     Status  New    Target Date  07/21/17      PT LONG TERM GOAL #2   Title  Patient will demonstrate improvement with daily tasks with decreased right knee pain as indicated by LEFS score of 45/80    Baseline  LEFS 15/80     Status  New    Target Date  08/11/17      PT LONG TERM GOAL #3   Title  Patient will demonstrate improved functional community ambulation and decreased fall risk without AD and 10MW time of 10 seconds or less     Baseline  to be assessed    Status  New    Target Date  08/11/17      PT LONG TERM GOAL #4   Title  Patient will be independent with home program for pain control and progressive exercise for self  managment once discharged from physical therapy    Baseline  limited knoweldge and requires moderate cuing for pain control, exercises and progression    Status  New    Target Date  08/11/17      PT LONG TERM GOAL #5   Title  Patient will have AROM right knee flexion to at least 110 or greater to allow improved mobility with transfers and stairs with minimal to no difficulty    Baseline  AROM right knee flexion 0-75 degrees with pain    Status  New    Target Date  07/29/17            Plan -  07/10/17 1453    Clinical Impression Statement  Patient demonstrates improving flexibility right knee with treatment. She remains with significant limitations in ROM to 60/62 degrees of flexion due to continued swelling along joing line with inflammation. She should continue with improvement with additional physical therapy intervention to address limitations and progress exercises as tolerated.     Rehab Potential  Good    Clinical Impairments Affecting Rehab Potential  (+)acute condition, family support, motivated, previous TKR(-) arthritis, COPD, PVD    PT Frequency  3x / week    PT Duration  6 weeks    PT Treatment/Interventions  Gait training;Neuromuscular re-education;Manual techniques;Patient/family education;Therapeutic exercise;Electrical Stimulation;Cryotherapy    PT Next Visit Plan  pain control, ROM exercise, strength exercises    PT Home Exercise Plan  elevation, use of ice, ROM for knee extension, flexion       Patient will benefit from skilled therapeutic intervention in order to improve the following deficits and impairments:  Pain, Increased muscle spasms, Decreased strength, Decreased range of motion, Decreased endurance, Decreased activity tolerance, Difficulty walking, Impaired perceived functional ability  Visit Diagnosis: Stiffness of right knee, not elsewhere classified  Difficulty in walking, not elsewhere classified  Muscle weakness (generalized)     Problem  List Patient Active Problem List   Diagnosis Date Noted  . 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 OA (osteoarthritis) of knee 11/04/2016  . COPD (chronic obstructive pulmonary disease) ([place]) 12/04/2015  . Ovarian mass, left 10/04/2015  . Osteoporosis 09/05/2015  . Skin lesion 08/01/2015  . Chronic infection of sinus 06/30/2015  . H/O varicella 06/30/2015  . Lung nodule, solitary 06/30/2015  . Allergic rhinitis 05/26/2015  . Bilateral cataracts 05/26/2015  . 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 Hypercholesteremia 05/26/2015  . Hypothyroidism 05/26/2015  . Cramps of lower extremity 05/26/2015  . Hemorrhoids, internal 05/26/2015  . Cervical pain 05/26/2015  . 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 Acquired trigger finger 05/26/2015  . Diverticulitis 05/26/2015    Jomarie Longs PT 07/10/2017, 2:56 PM  [facility] [address] [place], [address] Phone: [phone]   Fax:  [phone]  Name: [name] [name] MRN: [id number] Date of Birth: 1941/01/27

## 2017-07-14 ENCOUNTER — Ambulatory Visit: Payer: PPO | Admitting: Physical Therapy

## 2017-07-14 DIAGNOSIS — M25661 Stiffness of right knee, not elsewhere classified: Secondary | ICD-10-CM

## 2017-07-14 DIAGNOSIS — M6281 Muscle weakness (generalized): Secondary | ICD-10-CM

## 2017-07-14 DIAGNOSIS — R262 Difficulty in walking, not elsewhere classified: Secondary | ICD-10-CM

## 2017-07-15 ENCOUNTER — Ambulatory Visit: Payer: PPO | Admitting: Physical Therapy

## 2017-07-15 ENCOUNTER — Encounter: Payer: Self-pay | Admitting: Physical Therapy

## 2017-07-15 DIAGNOSIS — R262 Difficulty in walking, not elsewhere classified: Secondary | ICD-10-CM

## 2017-07-15 DIAGNOSIS — M25661 Stiffness of right knee, not elsewhere classified: Secondary | ICD-10-CM | POA: Diagnosis not present

## 2017-07-15 DIAGNOSIS — M6281 Muscle weakness (generalized): Secondary | ICD-10-CM

## 2017-07-15 NOTE — Therapy (Signed)
[facility] [address] [place], [address] Phone: [phone]   Fax:  [phone]  Physical Therapy Treatment  Patient Details  Name: [name] [name] MRN: [id number] Date of Birth: [birth year] Referring Provider: Gaynelle Arabian MD   Encounter Date: 07/14/2017  PT End of Session - 07/14/17 1430    Visit Number  7    Number of Visits  18    Date for PT Re-Evaluation  08/11/17    Authorization Type  7    Authorization Time Period  10 (G codes)    PT Start Time  1350    PT Stop Time  1433    PT Time Calculation (min)  43 min    Equipment Utilized During Treatment  Right knee immobilizer    Activity Tolerance  Patient tolerated treatment well;Patient limited by pain;Other (comment) increased swelling, warmth right knee    Behavior During Therapy  WFL for tasks assessed/performed       Past Medical History:  Diagnosis Date  . Allergy   . Allergy    allergy to spandex "had a bad reaction of blisters when using after a surgery"  . 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 Bruises easily   . Complication of anesthesia    reports history of "trouble waking up after surgery", mother developed fevers questionable malignant hyperthermia   . COPD (chronic obstructive pulmonary disease) ([place])   . Dysrhythmia    frequent PVC's  . Family history of adverse reaction to anesthesia    mother with history of malignant hypertension  . GERD (gastroesophageal reflux disease)   . Heart palpitations   . Hyperlipidemia   . Hypothyroidism   . Peripheral vascular disease ([place]) 1980   pulmonary embolism  . Pneumonia   . Pulmonary embolism ([place])   . PVC (premature ventricular contraction)   . Shortness of breath dyspnea    with extertion  . Thyroid disease     Past Surgical History:  Procedure Laterality Date  . abdominal surgery    . APPENDECTOMY    . BLEPHAROPLASTY Bilateral 11/2014    left eyelid 08/03/2015 Dr. Vickki Muff  . BREAST CYST ASPIRATION Left   . CHOLECYSTECTOMY  03/2004  . COLONOSCOPY    . [place]   fertility testing  . EYE SURGERY Bilateral 2015   cataract excision  . KNEE SURGERY Right    knee arthroscopy-2000; Total knee replacement-2002   . LAPAROTOMY  1969   fertility testing  . patelectomy Right 1972  . Removal of medical meniscus  1972  . TOTAL KNEE ARTHROPLASTY Left 11/04/2016   Procedure: LEFT TOTAL KNEE ARTHROPLASTY;  Surgeon: Gaynelle Arabian, MD;  Location: [facility];  Service: Orthopedics;  Laterality: Left;  requests 5mins  . TOTAL KNEE REVISION Right 06/25/2017   Procedure: RIGHT TOTAL KNEE REVISION;  Surgeon: Gaynelle Arabian, MD;  Location: [facility];  Service: Orthopedics;  Laterality: Right;  . UPPER GI ENDOSCOPY      There were no vitals filed for this visit.  Subjective Assessment - 07/14/17 1400    Subjective  Patient reports she is exercising at home with elevation and ice. she is feeling increased stiffness and is concerned. She is able to begin Meloxicam this week and feels this will make a difference.    Patient is accompained by:  Family member husband    Pertinent History  right TKR revision 06/25/2017;  left TKR 11/04/2016; arthritis; patient enjoys outdoor activities, hiking    Limitations  Sitting;Standing;Walking;House hold activities    Patient Stated Goals  to walk and move without difficulty and not using AD    Currently in Pain?  Other (Comment) swelling, tight right knee         Objective: AAROM: pre treatment:55 degrees, up to 60 degrees with patient at edge of treatmenttable Observation: moderate swelling and increased warmth right knee Gait: ambulating with rolling walker, decreased hip/knee flexion right LE with swing through, improving pattern with concentration Palpation:decreased soft tissue elasticity with palpable spasms along quadriceps and hamstring muscles right  LE  Treatment: Manual therapy: 10 min prior to exercise; goal: pain, spasms, improve ROM STM performed to right LE quadriceps/hamstring muscles with patient supine lying; superficial techniques with instruction to patient and husband to perform light STM at home for relaxing muscles Therapeutic exercise:patient performed with assistance, verbal and tactile cues of therapist: Supine lying hip/kneeflexion, quad sets with assistance of therapist; isometric hamstring 3 sets 5 reps alternating with knee extension Elevation and iceto right knee on wedgex15 min. At end of session,goals:decreased swelling, warmth and redness with improved soft tissue mobility; no adverse reaction noted following ice  Patient response to treatment:Improved soft tissue elasticity with improved flexibility of right knee with minimal increase in ROM. No adverse reactions noted with cryotherapy.     PT Education - 07/14/17 1430    Education provided  Yes    Education Details  exercise instruction for ROM    Person(s) Educated  Patient    Methods  Explanation;Verbal cues;Tactile cues    Comprehension  Verbalized understanding;Verbal cues required;Tactile cues required          PT Long Term Goals - 06/30/17 1449      PT LONG TERM GOAL #1   Title  Patient will demonstrate improvement with daily tasks with decreased right knee pain as indicated by LEFS score of 30/80     Baseline  LEFS 15/80     Status  New    Target Date  07/21/17      PT LONG TERM GOAL #2   Title  Patient will demonstrate improvement with daily tasks with decreased right knee pain as indicated by LEFS score of 45/80    Baseline  LEFS 15/80     Status  New    Target Date  08/11/17      PT LONG TERM GOAL #3   Title  Patient will demonstrate improved functional community ambulation and decreased fall risk without AD and 10MW time of 10 seconds or less     Baseline  to be assessed    Status  New    Target Date  08/11/17       PT LONG TERM GOAL #4   Title  Patient will be independent with home program for pain control and progressive exercise for self managment once discharged from physical therapy    Baseline  limited knoweldge and requires moderate cuing for pain control, exercises and progression    Status  New    Target Date  08/11/17      PT LONG TERM GOAL #5   Title  Patient will have AROM right knee flexion to at least 110 or greater to allow improved mobility with transfers and stairs with minimal to no difficulty    Baseline  AROM right knee flexion 0-75 degrees with pain    Status  New    Target Date  07/29/17  Plan - 07/14/17 1435    Clinical Impression Statement  Patient demonstrates continued moderate swelling and pain that limit ability to tolerate exercises and  flexion of right knee. As her swelling and pain subside she should begin to [place] flexibiltiy and function. She is able to tie shoes which demonstrates improving function.     Rehab Potential  Good    Clinical Impairments Affecting Rehab Potential  (+)acute condition, family support, motivated, previous TKR(-) arthritis, COPD, PVD    PT Frequency  3x / week    PT Duration  6 weeks    PT Treatment/Interventions  Gait training;Neuromuscular re-education;Manual techniques;Patient/family education;Therapeutic exercise;Electrical Stimulation;Cryotherapy    PT Next Visit Plan  pain control, ROM exercise, strength exercises    PT Home Exercise Plan  elevation, use of ice, ROM for knee extension, flexion       Patient will benefit from skilled therapeutic intervention in order to improve the following deficits and impairments:  Pain, Increased muscle spasms, Decreased strength, Decreased range of motion, Decreased endurance, Decreased activity tolerance, Difficulty walking, Impaired perceived functional ability  Visit Diagnosis: Stiffness of right knee, not elsewhere classified  Difficulty in walking, not elsewhere  classified  Muscle weakness (generalized)     Problem List Patient Active Problem List   Diagnosis Date Noted  . 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 OA (osteoarthritis) of knee 11/04/2016  . COPD (chronic obstructive pulmonary disease) ([place]) 12/04/2015  . Ovarian mass, left 10/04/2015  . Osteoporosis 09/05/2015  . Skin lesion 08/01/2015  . Chronic infection of sinus 06/30/2015  . H/O varicella 06/30/2015  . Lung nodule, solitary 06/30/2015  . Allergic rhinitis 05/26/2015  . Bilateral cataracts 05/26/2015  . 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 Hypercholesteremia 05/26/2015  . Hypothyroidism 05/26/2015  . Cramps of lower extremity 05/26/2015  . Hemorrhoids, internal 05/26/2015  . Cervical pain 05/26/2015  . 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 Acquired trigger finger 05/26/2015  . Diverticulitis 05/26/2015    Jomarie Longs PT 07/15/2017, 6:42 PM  [facility] [address] [place], [address] Phone: [phone]   Fax:  [phone]  Name: MESA JANUS MRN: [id number] Date of Birth: 1941-04-27

## 2017-07-16 ENCOUNTER — Ambulatory Visit: Payer: PPO | Admitting: Physical Therapy

## 2017-07-16 ENCOUNTER — Encounter: Payer: Self-pay | Admitting: Physical Therapy

## 2017-07-16 DIAGNOSIS — M25661 Stiffness of right knee, not elsewhere classified: Secondary | ICD-10-CM | POA: Diagnosis not present

## 2017-07-16 DIAGNOSIS — M6281 Muscle weakness (generalized): Secondary | ICD-10-CM

## 2017-07-16 DIAGNOSIS — R262 Difficulty in walking, not elsewhere classified: Secondary | ICD-10-CM

## 2017-07-16 NOTE — Therapy (Signed)
[facility] [address] [place], [address] Phone: [phone]   Fax:  [phone]  Physical Therapy Treatment  Patient Details  Name: [name] [name] MRN: [id number] Date of Birth: [birth year] Referring Provider: Gaynelle Arabian MD   Encounter Date: 07/15/2017  PT End of Session - 07/15/17 1436    Visit Number  8    Number of Visits  18    Date for PT Re-Evaluation  08/11/17    Authorization Type  8    Authorization Time Period  10 (G codes)    PT Start Time  1346    PT Stop Time  1430    PT Time Calculation (min)  44 min    Equipment Utilized During Treatment  Right knee immobilizer    Activity Tolerance  Patient tolerated treatment well;Patient limited by pain;Other (comment) increased swelling, warmth right knee    Behavior During Therapy  WFL for tasks assessed/performed       Past Medical History:  Diagnosis Date  . Allergy   . Allergy    allergy to spandex "had a bad reaction of blisters when using after a surgery"  . 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 Bruises easily   . Complication of anesthesia    reports history of "trouble waking up after surgery", mother developed fevers questionable malignant hyperthermia   . COPD (chronic obstructive pulmonary disease) ([place])   . Dysrhythmia    frequent PVC's  . Family history of adverse reaction to anesthesia    mother with history of malignant hypertension  . GERD (gastroesophageal reflux disease)   . Heart palpitations   . Hyperlipidemia   . Hypothyroidism   . Peripheral vascular disease ([place]) 1980   pulmonary embolism  . Pneumonia   . Pulmonary embolism ([place])   . PVC (premature ventricular contraction)   . Shortness of breath dyspnea    with extertion  . Thyroid disease     Past Surgical History:  Procedure Laterality Date  . abdominal surgery    . APPENDECTOMY    . BLEPHAROPLASTY Bilateral 11/2014   left eyelid 08/03/2015 Dr. Vickki Muff  . BREAST CYST ASPIRATION Left   . CHOLECYSTECTOMY  03/2004  . COLONOSCOPY    . [place]   fertility testing  . EYE SURGERY Bilateral 2015   cataract excision  . KNEE SURGERY Right    knee arthroscopy-2000; Total knee replacement-2002   . LAPAROTOMY  1969   fertility testing  . patelectomy Right 1972  . Removal of medical meniscus  1972  . TOTAL KNEE ARTHROPLASTY Left 11/04/2016   Procedure: LEFT TOTAL KNEE ARTHROPLASTY;  Surgeon: Gaynelle Arabian, MD;  [facility]: [facility];  Service: Orthopedics;  Laterality: Left;  requests 40mins  . TOTAL KNEE REVISION Right 06/25/2017   Procedure: RIGHT TOTAL KNEE REVISION;  Surgeon: Gaynelle Arabian, MD;  Location: [facility];  Service: Orthopedics;  Laterality: Right;  . UPPER GI ENDOSCOPY      There were no vitals filed for this visit.  Subjective Assessment - 07/15/17 1345    Subjective  Patient reports she is exercising at home with elevation and ice. she is feeling increased stiffness and is concerned. She is able to begin Meloxicam this week and feels this will make a difference.    Patient is accompained by:  Family member husband    Pertinent History  right TKR revision 06/25/2017; left  TKR 11/04/2016; arthritis; patient enjoys outdoor activities, hiking    Limitations  Sitting;Standing;Walking;House hold activities    Patient Stated Goals  to walk and move without difficulty and not using AD    Currently in Pain?  Other (Comment) tightness left knee         Objective: Observation: moderate swelling and increased warmth right knee Gait: ambulating with rolling walker, decreased hip/knee flexion right LE with swing through, improving pattern with concentration Palpation:decreased soft tissue elasticity with palpable spasms along quadriceps and hamstring muscles right LE  Treatment: Manual therapy: 10 min prior to exercise; goal: pain, spasms, improve ROM STM performed to right LE  quadriceps/hamstring muscles with patient supine lying; superficial techniques with instruction to patient and husband to perform light STM at home for relaxing muscles Therapeutic exercise:patient performed with assistance, verbal and tactile cues of therapist: Supine lying hip/kneeflexion, quad sets with assistance of therapist; isometric hamstring 3 sets 10 reps alternating with knee extension Elevation and iceto right knee on wedgex 10-15 min. At end of session,goals:decreased swelling, warmth andrednesswithimproved soft tissue mobility; no adverse reaction noted following ice  Patient response to treatment:Improved soft tissue elasticity with improved flexibility of right knee with minimal increase in ROM. No adverse reactions noted with cryotherapy.      PT Education - 07/15/17 1400    Education provided  Yes    Education Details  Continue with home exercises, ice, elevation to control inflammation/pain and continue ROM frequently    Person(s) Educated  Patient    Methods  Explanation    Comprehension  Verbalized understanding          PT Long Term Goals - 06/30/17 1449      PT LONG TERM GOAL #1   Title  Patient will demonstrate improvement with daily tasks with decreased right knee pain as indicated by LEFS score of 30/80     Baseline  LEFS 15/80     Status  New    Target Date  07/21/17      PT LONG TERM GOAL #2   Title  Patient will demonstrate improvement with daily tasks with decreased right knee pain as indicated by LEFS score of 45/80    Baseline  LEFS 15/80     Status  New    Target Date  08/11/17      PT LONG TERM GOAL #3   Title  Patient will demonstrate improved functional community ambulation and decreased fall risk without AD and 10MW time of 10 seconds or less     Baseline  to be assessed    Status  New    Target Date  08/11/17      PT LONG TERM GOAL #4   Title  Patient will be independent with home program for pain control and progressive  exercise for self managment once discharged from physical therapy    Baseline  limited knoweldge and requires moderate cuing for pain control, exercises and progression    Status  New    Target Date  08/11/17      PT LONG TERM GOAL #5   Title  Patient will have AROM right knee flexion to at least 110 or greater to allow improved mobility with transfers and stairs with minimal to no difficulty    Baseline  AROM right knee flexion 0-75 degrees with pain    Status  New    Target Date  07/29/17            Plan - 07/15/17  [place]    Clinical Impression Statement  Patient continues with moderate swelling and inflammation right knee that is limiting motion. she is consistent with ROM, ice and elevation and as her swelling and pain improve her ROM should also improve along with function    Rehab Potential  Good    Clinical Impairments Affecting Rehab Potential  (+)acute condition, family support, motivated, previous TKR(-) arthritis, COPD, PVD    PT Frequency  3x / week    PT Duration  6 weeks    PT Treatment/Interventions  Gait training;Neuromuscular re-education;Manual techniques;Patient/family education;Therapeutic exercise;Electrical Stimulation;Cryotherapy    PT Next Visit Plan  pain control, ROM exercise, strength exercises    PT Home Exercise Plan  elevation, use of ice, ROM for knee extension, flexion       Patient will benefit from skilled therapeutic intervention in order to improve the following deficits and impairments:  Pain, Increased muscle spasms, Decreased strength, Decreased range of motion, Decreased endurance, Decreased activity tolerance, Difficulty walking, Impaired perceived functional ability  Visit Diagnosis: Stiffness of right knee, not elsewhere classified  Difficulty in walking, not elsewhere classified  Muscle weakness (generalized)     Problem List Patient Active Problem List   Diagnosis Date Noted  . 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  OA (osteoarthritis) of knee 11/04/2016  . COPD (chronic obstructive pulmonary disease) ([place]) 12/04/2015  . Ovarian mass, left 10/04/2015  . Osteoporosis 09/05/2015  . Skin lesion 08/01/2015  . Chronic infection of sinus 06/30/2015  . H/O varicella 06/30/2015  . Lung nodule, solitary 06/30/2015  . Allergic rhinitis 05/26/2015  . Bilateral cataracts 05/26/2015  . 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 Hypercholesteremia 05/26/2015  . Hypothyroidism 05/26/2015  . Cramps of lower extremity 05/26/2015  . Hemorrhoids, internal 05/26/2015  . Cervical pain 05/26/2015  . 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 Acquired trigger finger 05/26/2015  . Diverticulitis 05/26/2015    Jomarie Longs PT 07/16/2017, 7:30 PM  [facility] [address] [place], [address] Phone: [phone]   Fax:  [phone]  Name: SHANAE LUO MRN: [id number] Date of Birth: 04/13/1941

## 2017-07-17 NOTE — Therapy (Signed)
[facility] [address] [place], [address] Phone: [phone]   Fax:  [phone]  Physical Therapy Treatment  Patient Details  Name: [name] [name] MRN: [id number] Date of Birth: May 31, 1941 Referring Provider: Gaynelle Arabian MD   Encounter Date: 07/16/2017  PT End of Session - 07/16/17 1944    Visit Number  9    Number of Visits  18    Date for PT Re-Evaluation  08/11/17    Authorization Type  9    Authorization Time Period  10 (G codes)    PT Start Time  0109    PT Stop Time  1432    PT Time Calculation (min)  47 min    Equipment Utilized During Treatment  Right knee immobilizer    Activity Tolerance  Patient tolerated treatment well;Patient limited by pain;Other (comment) increased swelling, warmth right knee    Behavior During Therapy  WFL for tasks assessed/performed       Past Medical History:  Diagnosis Date  . Allergy   . Allergy    allergy to spandex "had a bad reaction of blisters when using after a surgery"  . 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 Bruises easily   . Complication of anesthesia    reports history of "trouble waking up after surgery", mother developed fevers questionable malignant hyperthermia   . COPD (chronic obstructive pulmonary disease) ([place])   . Dysrhythmia    frequent PVC's  . Family history of adverse reaction to anesthesia    mother with history of malignant hypertension  . GERD (gastroesophageal reflux disease)   . Heart palpitations   . Hyperlipidemia   . Hypothyroidism   . Peripheral vascular disease ([place]) 1980   pulmonary embolism  . Pneumonia   . Pulmonary embolism ([place])   . PVC (premature ventricular contraction)   . Shortness of breath dyspnea    with extertion  . Thyroid disease     Past Surgical History:  Procedure Laterality Date  . abdominal surgery    . APPENDECTOMY    . BLEPHAROPLASTY Bilateral 11/2014   left eyelid 08/03/2015 Dr. Vickki Muff  . BREAST CYST ASPIRATION Left   . CHOLECYSTECTOMY  03/2004  . COLONOSCOPY    . [place]   fertility testing  . EYE SURGERY Bilateral 2015   cataract excision  . KNEE SURGERY Right    knee arthroscopy-2000; Total knee replacement-2002   . LAPAROTOMY  1969   fertility testing  . patelectomy Right 1972  . Removal of medical meniscus  1972  . TOTAL KNEE ARTHROPLASTY Left 11/04/2016   Procedure: LEFT TOTAL KNEE ARTHROPLASTY;  Surgeon: Gaynelle Arabian, MD;  Location: [facility];  Service: Orthopedics;  Laterality: Left;  requests 36mins  . TOTAL KNEE REVISION Right 06/25/2017   Procedure: RIGHT TOTAL KNEE REVISION;  Surgeon: Gaynelle Arabian, MD;  Location: [facility];  Service: Orthopedics;  Laterality: Right;  . UPPER GI ENDOSCOPY      There were no vitals filed for this visit.  Subjective Assessment - 07/16/17 1345    Subjective  Patient reports she is exercising at home with elevation and ice. she is feeling increased stiffness and is concerned. She is able to begin Meloxicam this week and feels this will make a difference.    Patient is accompained by:  Family member husband    Pertinent History  right TKR revision 06/25/2017; left  TKR 11/04/2016; arthritis; patient enjoys outdoor activities, hiking    Limitations  Sitting;Standing;Walking;House hold activities    Patient Stated Goals  to walk and move without difficulty and not using AD       Objective: Observation: moderate swelling, moderate warmth right knee, improved from previous session Gait: ambulating with rolling walker, decreased hip/knee flexion right LE with swing through, improving Palpation:decreased soft tissue elasticity with palpable spasms along quadriceps and hamstring muscles right LE  Treatment: Manual therapy: 10 min prior to exercise; goal: pain, spasms, improve ROM STM performed to right LE quadriceps/hamstring muscles with patient supine lying;  superficial techniques   Therapeutic exercise:patient performed with assistance, verbal and tactile cues of therapist: Supine lying hip/kneeflexion, quad sets with assistance of therapist; isometric hamstring 3 sets 10 reps alternating with knee extension Elevation and iceto right knee on wedgex 10-15 min. At end of session,goals:decreased swelling, warmth andrednesswithimproved soft tissue mobility; no adverse reaction noted following ice  Patient response to treatment:Patient with improved flexibility into right knee flexion and improved soft tissue elasticity by 50% with STM.  No adverse reactions noted with cryotherapy.    PT Education - 07/16/17 1517    Education provided  Yes    Education Details  home instruction re assessed    Person(s) Educated  Patient    Methods  Explanation    Comprehension  Verbalized understanding          PT Long Term Goals - 06/30/17 1449      PT LONG TERM GOAL #1   Title  Patient will demonstrate improvement with daily tasks with decreased right knee pain as indicated by LEFS score of 30/80     Baseline  LEFS 15/80     Status  New    Target Date  07/21/17      PT LONG TERM GOAL #2   Title  Patient will demonstrate improvement with daily tasks with decreased right knee pain as indicated by LEFS score of 45/80    Baseline  LEFS 15/80     Status  New    Target Date  08/11/17      PT LONG TERM GOAL #3   Title  Patient will demonstrate improved functional community ambulation and decreased fall risk without AD and 10MW time of 10 seconds or less     Baseline  to be assessed    Status  New    Target Date  08/11/17      PT LONG TERM GOAL #4   Title  Patient will be independent with home program for pain control and progressive exercise for self managment once discharged from physical therapy    Baseline  limited knoweldge and requires moderate cuing for pain control, exercises and progression    Status  New    Target Date   08/11/17      PT LONG TERM GOAL #5   Title  Patient will have AROM right knee flexion to at least 110 or greater to allow improved mobility with transfers and stairs with minimal to no difficulty    Baseline  AROM right knee flexion 0-75 degrees with pain    Status  New    Target Date  07/29/17            Plan - 07/16/17 1519    Clinical Impression Statement  Patient continues with swelling, inflammation and pain that limit ability to gain ROM right knee. She should continue to progress as she is able to control the  inflammation; she is going ot begin her Meloxicam tomorrow.     Rehab Potential  Good    Clinical Impairments Affecting Rehab Potential  (+)acute condition, family support, motivated, previous TKR(-) arthritis, COPD, PVD    PT Frequency  3x / week    PT Duration  6 weeks    PT Treatment/Interventions  Gait training;Neuromuscular re-education;Manual techniques;Patient/family education;Therapeutic exercise;Electrical Stimulation;Cryotherapy    PT Next Visit Plan  pain control, ROM exercise, strength exercises    PT Home Exercise Plan  elevation, use of ice, ROM for knee extension, flexion       Patient will benefit from skilled therapeutic intervention in order to improve the following deficits and impairments:  Pain, Increased muscle spasms, Decreased strength, Decreased range of motion, Decreased endurance, Decreased activity tolerance, Difficulty walking, Impaired perceived functional ability  Visit Diagnosis: Stiffness of right knee, not elsewhere classified  Difficulty in walking, not elsewhere classified  Muscle weakness (generalized)     Problem List Patient Active Problem List   Diagnosis Date Noted  . 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 OA (osteoarthritis) of knee 11/04/2016  . COPD (chronic obstructive pulmonary disease) ([place]) 12/04/2015  . Ovarian mass, left 10/04/2015  . Osteoporosis 09/05/2015  . Skin lesion 08/01/2015  . Chronic  infection of sinus 06/30/2015  . H/O varicella 06/30/2015  . Lung nodule, solitary 06/30/2015  . Allergic rhinitis 05/26/2015  . Bilateral cataracts 05/26/2015  . 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 Hypercholesteremia 05/26/2015  . Hypothyroidism 05/26/2015  . Cramps of lower extremity 05/26/2015  . Hemorrhoids, internal 05/26/2015  . Cervical pain 05/26/2015  . 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 Acquired trigger finger 05/26/2015  . Diverticulitis 05/26/2015    Jomarie Longs PT 07/17/2017, 5:52 PM  [facility] [address] [place], [address] Phone: [phone]   Fax:  [phone]  Name: [name] [name] MRN: [id number] Date of Birth: April 01, 1941

## 2017-07-21 ENCOUNTER — Ambulatory Visit (INDEPENDENT_AMBULATORY_CARE_PROVIDER_SITE_OTHER): Payer: PPO | Admitting: Family Medicine

## 2017-07-21 ENCOUNTER — Encounter: Payer: Self-pay | Admitting: Physical Therapy

## 2017-07-21 ENCOUNTER — Ambulatory Visit: Payer: PPO | Admitting: Physical Therapy

## 2017-07-21 ENCOUNTER — Encounter: Payer: Self-pay | Admitting: Family Medicine

## 2017-07-21 VITALS — BP 118/60 | HR 80 | Temp 97.5°F | Resp 16 | Ht 63.0 in | Wt 131.0 lb

## 2017-07-21 DIAGNOSIS — E7800 Pure hypercholesterolemia, unspecified: Secondary | ICD-10-CM | POA: Diagnosis not present

## 2017-07-21 DIAGNOSIS — M25661 Stiffness of right knee, not elsewhere classified: Secondary | ICD-10-CM | POA: Diagnosis not present

## 2017-07-21 DIAGNOSIS — R233 Spontaneous ecchymoses: Secondary | ICD-10-CM

## 2017-07-21 DIAGNOSIS — E871 Hypo-osmolality and hyponatremia: Secondary | ICD-10-CM | POA: Diagnosis not present

## 2017-07-21 DIAGNOSIS — E039 Hypothyroidism, unspecified: Secondary | ICD-10-CM

## 2017-07-21 DIAGNOSIS — Z0000 Encounter for general adult medical examination without abnormal findings: Secondary | ICD-10-CM

## 2017-07-21 DIAGNOSIS — Z1231 Encounter for screening mammogram for malignant neoplasm of breast: Secondary | ICD-10-CM | POA: Diagnosis not present

## 2017-07-21 DIAGNOSIS — R238 Other skin changes: Secondary | ICD-10-CM | POA: Insufficient documentation

## 2017-07-21 DIAGNOSIS — M810 Age-related osteoporosis without current pathological fracture: Secondary | ICD-10-CM | POA: Diagnosis not present

## 2017-07-21 DIAGNOSIS — R262 Difficulty in walking, not elsewhere classified: Secondary | ICD-10-CM

## 2017-07-21 DIAGNOSIS — J449 Chronic obstructive pulmonary disease, unspecified: Secondary | ICD-10-CM | POA: Diagnosis not present

## 2017-07-21 DIAGNOSIS — M6281 Muscle weakness (generalized): Secondary | ICD-10-CM

## 2017-07-21 NOTE — Assessment & Plan Note (Signed)
Well controlled Continue advair and spiriva Continue albuterol prn F/u in 6 months

## 2017-07-21 NOTE — Assessment & Plan Note (Signed)
Recheck DEXA to see if BMD has worsened

## 2017-07-21 NOTE — Assessment & Plan Note (Signed)
Recent CBC wnl Suspect multifactorial - related to recent DOAC use around TKA, ICS use chronically, and age Continue to monitor

## 2017-07-21 NOTE — Progress Notes (Signed)
Patient: [name] [name], Female    DOB: 27-Nov-1940, 76 y.o.   MRN: [id number] Visit Date: 07/21/2017  Today's Provider: Lavon Paganini, MD   Chief Complaint  Patient presents with  . Medicare Wellness   Subjective:    Annual wellness visit [name] [name] is a 76 y.o. female. She feels fairly well. Knee pain s/p right total knee revision. She reports exercising 3 days a week. PT. She reports she is sleeping fairly well. Her sleeping is improved now that she is in less pain; states she is taking Tylenol PM and Hemp Oil, which helps sleep.  Last mammogram- 07/25/2016- BI-RADS 1 Last colonoscopy- 11/10/2008- internal hemorrhoids; repeat 10 years Last BMD- 07/25/2015- osteopenia. Not currently taking Ca supplement. Not interested in medications for bone density. Was walking 5 miles daily prior to knee surgery and wants to be back to this -----------------------------------------------------------  Easy bruising - ongoing for ~3 years - worse on arms than elsewhere - knows that labs have been normal  Review of Systems  Constitutional: Negative.   HENT: Negative.   Eyes: Negative.   Respiratory: Positive for shortness of breath. Negative for apnea, cough, choking, chest tightness, wheezing and stridor.   Cardiovascular: Positive for palpitations and leg swelling. Negative for chest pain.  Gastrointestinal: Positive for constipation. Negative for abdominal distention, abdominal pain, anal bleeding, blood in stool, diarrhea, nausea, rectal pain and vomiting.  Endocrine: Positive for cold intolerance. Negative for heat intolerance, polydipsia, polyphagia and polyuria.  Genitourinary: Positive for enuresis and urgency. Negative for decreased urine volume, difficulty urinating, dyspareunia, dysuria, flank pain, frequency, genital sores, hematuria, menstrual problem, pelvic pain, vaginal bleeding, vaginal discharge and vaginal pain.  Musculoskeletal: Positive for arthralgias, back  pain, gait problem, joint swelling and myalgias. Negative for neck pain and neck stiffness.  Skin: Negative.   Allergic/Immunologic: Positive for environmental allergies. Negative for food allergies and immunocompromised state.  Neurological: Negative for dizziness, tremors, seizures, syncope, facial asymmetry, speech difficulty, weakness, light-headedness, numbness and headaches.  Hematological: Negative for adenopathy. Bruises/bleeds easily.  Psychiatric/Behavioral: Negative.     Social History   Socioeconomic History  . Marital status: Married    Spouse name: Richard  . Number of children: 0  . Years of education: Not on file  . Highest education level: Bachelor's degree (e.g., BA, AB, BS)  Social Needs  . Financial resource strain: Not on file  . Food insecurity - worry: Not on file  . Food insecurity - inability: Not on file  . Transportation needs - medical: Not on file  . Transportation needs - non-medical: Not on file  Occupational History  . Occupation: retired  Tobacco Use  . Smoking status: Former Smoker    Last attempt to quit: 08/25/1998    Years since quitting: 18.9  . Smokeless tobacco: Never Used  Substance and Sexual Activity  . Alcohol use: Yes    Comment: liquor occasionally; bottle of wine every week   . Drug use: No  . Sexual activity: Not on file  Other Topics Concern  . Not on file  Social History Narrative  . Not on file    Past Medical History:  Diagnosis Date  . Allergy   . Allergy    allergy to spandex "had a bad reaction of blisters when using after a surgery"  . 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 Bruises easily   . Complication of anesthesia    reports history  of "trouble waking up after surgery", mother developed fevers questionable malignant hyperthermia   . COPD (chronic obstructive pulmonary disease) ([place])   . Dysrhythmia    frequent PVC's  . Family history of adverse reaction to anesthesia     mother with history of malignant hypertension  . GERD (gastroesophageal reflux disease)   . Heart palpitations   . Hyperlipidemia   . Hypothyroidism   . Peripheral vascular disease ([place]) 1980   pulmonary embolism  . Pneumonia   . Pulmonary embolism ([place])   . PVC (premature ventricular contraction)   . Shortness of breath dyspnea    with extertion  . Thyroid disease      Patient Active Problem List   Diagnosis Date Noted  . Easy bruising 07/21/2017  . 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 OA (osteoarthritis) of knee 11/04/2016  . COPD (chronic obstructive pulmonary disease) ([place]) 12/04/2015  . Ovarian mass, left 10/04/2015  . Osteoporosis 09/05/2015  . Skin lesion 08/01/2015  . Chronic infection of sinus 06/30/2015  . H/O varicella 06/30/2015  . Lung nodule, solitary 06/30/2015  . Allergic rhinitis 05/26/2015  . Bilateral cataracts 05/26/2015  . 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 Hypercholesteremia 05/26/2015  . Hypothyroidism 05/26/2015  . Cramps of lower extremity 05/26/2015  . Hemorrhoids, internal 05/26/2015  . Cervical pain 05/26/2015  . 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 Acquired trigger finger 05/26/2015  . Diverticulitis 05/26/2015    Past Surgical History:  Procedure Laterality Date  . abdominal surgery    . APPENDECTOMY    . BLEPHAROPLASTY Bilateral 11/2014   left eyelid 08/03/2015 Dr. Vickki Muff  . BREAST CYST ASPIRATION Left   . CHOLECYSTECTOMY  03/2004  . COLONOSCOPY    . [place]   fertility testing  . EYE SURGERY Bilateral 2015   cataract excision  . KNEE SURGERY Right    knee arthroscopy-2000; Total knee replacement-2002   . LAPAROTOMY  1969   fertility testing  . patelectomy Right 1972  . Removal of medical meniscus  1972  . TOTAL KNEE ARTHROPLASTY Left 11/04/2016   Procedure: LEFT TOTAL KNEE ARTHROPLASTY;  Surgeon: Gaynelle Arabian, MD;  [facility];  Service: Orthopedics;  Laterality:  Left;  requests 73mins  . TOTAL KNEE REVISION Right 06/25/2017   Procedure: RIGHT TOTAL KNEE REVISION;  Surgeon: Gaynelle Arabian, MD;  Location: [facility];  Service: Orthopedics;  Laterality: Right;  . UPPER GI ENDOSCOPY      Her family history includes Cancer in her sister and sister; Congestive Heart Failure in her mother; Diabetes in her mother; Heart disease in her father and sister; Hyperlipidemia in her sister; Hypertension in her father and mother; Hyperthyroidism in her sister; Osteoporosis in her mother and sister; Paget's disease of bone in her sister.      Current Outpatient Medications:  .  acetaminophen (TYLENOL) 500 MG tablet, Take 500 mg by mouth every 6 (six) hours as needed for moderate pain or headache. , Disp: , Rfl:  .  ADVAIR DISKUS 250-50 MCG/DOSE AEPB, Inhale 1 puff into the lungs 2 (two) times daily., Disp: 60 each, Rfl: 5 .  albuterol (VENTOLIN HFA) 108 (90 Base) MCG/ACT inhaler, Inhale 1-2 puffs into the lungs every 4 (four) hours as needed for wheezing or shortness of breath. Reported on 12/04/2015, Disp: 1 Inhaler, Rfl: 12 .  benzonatate (TESSALON) 100 MG capsule, Take 1 capsule (100 mg total) by mouth daily., Disp: 30 capsule, Rfl: 1 .  docusate sodium (  COLACE) 100 MG capsule, Take 100 mg by mouth daily., Disp: , Rfl:  .  fexofenadine (ALLEGRA) 180 MG tablet, Take 180 mg by mouth daily as needed for allergies. , Disp: , Rfl:  .  flecainide (TAMBOCOR) 50 MG tablet, Take 50 mg by mouth 2 (two) times daily., Disp: , Rfl:  .  HYDROmorphone (DILAUDID) 2 MG tablet, Take 0.5-1 tablets (1-2 mg total) by mouth every 4 (four) hours as needed for severe pain., Disp: 56 tablet, Rfl: 0 .  levothyroxine (SYNTHROID, LEVOTHROID) 75 MCG tablet, Take 1 tablet (75 mcg total) by mouth daily., Disp: 90 tablet, Rfl: 3 .  methocarbamol (ROBAXIN) 500 MG tablet, Take 1 tablet (500 mg total) by mouth every 6 (six) hours as needed for muscle spasms., Disp: 80 tablet, Rfl: 0 .  OVER THE COUNTER  MEDICATION, Place 1 mL under the tongue at bedtime. Hemp Oil, Disp: , Rfl:  .  Polyethyl Glycol-Propyl Glycol (SYSTANE OP), Apply 1-2 drops to eye 3 (three) times daily as needed (for dry eyes)., Disp: , Rfl:  .  polyethylene glycol (MIRALAX / GLYCOLAX) packet, Take 17 g by mouth at bedtime as needed for moderate constipation. , Disp: , Rfl:  .  tiotropium (SPIRIVA HANDIHALER) 18 MCG inhalation capsule, Place 1 capsule (18 mcg total) into inhaler and inhale daily., Disp: 90 capsule, Rfl: 3 .  Triamcinolone Acetonide (NASACORT AQ NA), Place 2 sprays into both nostrils daily., Disp: , Rfl:   Patient Care Team: Mar Daring, PA-C as PCP - General (Family Medicine) Birder Robson, MD as Referring Physician (Ophthalmology) Ubaldo Glassing Javier Docker, MD as Consulting Physician (Cardiology) Gaynelle Arabian, MD as Consulting Physician (Orthopedic Surgery) Nestor Lewandowsky, MD as Referring Physician (Cardiothoracic Surgery)     Objective:   Vitals: BP 118/60 (BP Location: Left Arm, Patient Position: Sitting, Cuff Size: Normal)   Pulse 80   Temp (!) 97.5 F (36.4 C) (Oral)   Resp 16   Ht 5\' 3"  (1.6 m)   Wt 131 lb (59.4 kg)   SpO2 96%   BMI 23.21 kg/m   Physical Exam  Constitutional: She is oriented to person, place, and time. She appears well-developed and well-nourished. No distress.  HENT:  Head: Normocephalic and atraumatic.  Right Ear: External ear normal.  Left Ear: External ear normal.  Nose: Nose normal.  Mouth/Throat: Oropharynx is clear and moist.  Eyes: Conjunctivae are normal. Pupils are equal, round, and reactive to light. No scleral icterus.  Neck: Neck supple. No thyromegaly present.  Cardiovascular: Normal rate, regular rhythm, normal heart sounds and intact distal pulses.  No murmur heard. Pulmonary/Chest: Effort normal and breath sounds normal. No respiratory distress. She has no wheezes. She has no rales.  Breasts: breasts appear normal, no suspicious masses, no skin or  nipple changes or axillary nodes.  Abdominal: Soft. Bowel sounds are normal. She exhibits no distension. There is no tenderness. There is no rebound and no guarding.  Musculoskeletal: She exhibits no edema.  R knee with decreased ROM, walks with walker  Lymphadenopathy:    She has no cervical adenopathy.  Neurological: She is alert and oriented to person, place, and time.  Skin: Skin is warm and dry. No rash noted.  Psychiatric: She has a normal mood and affect. Her behavior is normal.  Vitals reviewed.   Activities of Daily Living In your present state of health, do you have any difficulty performing the following activities: 07/21/2017 06/25/2017  Hearing? Y Y  Comment - -  Vision? N  N  Difficulty concentrating or making decisions? N N  Walking or climbing stairs? Y Y  Dressing or bathing? Y N  Doing errands, shopping? Y N  Some recent data might be hidden    Fall Risk Assessment Fall Risk  07/21/2017 07/04/2016 06/28/2015  Falls in the past year? No No Yes  Number falls in past yr: - - 1  Injury with Fall? - - No     Depression Screen PHQ 2/9 Scores 07/21/2017 07/04/2016 06/28/2015  PHQ - 2 Score 0 1 0    Cognitive Testing - 6-CIT  Correct? Score   What year is it? yes 0 0 or 4  What month is it? yes 0 0 or 3  Memorize:    Pia Mau,  42,  [place],      What time is it? (within 1 hour) yes 0 0 or 3  Count backwards from 20 yes 0 0, 2, or 4  Name the months of the year yes 0 0, 2, or 4  Repeat name & address above yes 0 0, 2, 4, 6, 8, or 10       TOTAL SCORE  0/28   Interpretation:  Normal  Normal (0-7) Abnormal (8-28)    Audit-C Alcohol Use Screening  Question Answer Points  How often do you have alcoholic drink? 1 times monthly 1  On days you do drink alcohol, how many drinks do you typically consume? 1 0  How oftey will you drink 6 or more in a total? never 0  Total Score:  1   A score of 3 or more in women, and 4 or more in men indicates  increased risk for alcohol abuse, EXCEPT if all of the points are from question 1.    Assessment & Plan:     Annual Wellness Visit  Reviewed patient's Family Medical History Reviewed and updated list of patient's medical providers Assessment of cognitive impairment was done Assessed patient's functional ability Established a written schedule for health screening [place] Completed and Reviewed  Exercise Activities and Dietary recommendations Goals    None      Immunization History  Administered Date(s) Administered  . Influenza, High Dose Seasonal PF 05/25/2015, 06/06/2016, 05/08/2017  . Pneumococcal Conjugate-13 06/24/2014  . Pneumococcal Polysaccharide-23 09/23/2013  . Tdap 11/02/2010    Health Maintenance  Topic Date Due  . TETANUS/TDAP  11/01/2020  . INFLUENZA VACCINE  Completed  . DEXA SCAN  Completed  . PNA vac Low Risk Adult  Completed     Discussed health benefits of physical activity, and encouraged her to engage in regular exercise appropriate for her age and condition.  Will put on wait list for Shingrix vaccine    Problem List Items Addressed This Visit      Respiratory   COPD (chronic obstructive pulmonary disease) ([place])    Well controlled Continue advair and spiriva Continue albuterol prn F/u in 6 months        Endocrine   Hypothyroidism    Stable and asymptomatic Recheck TSH Adjust synthroid dose pending TSH      Relevant Orders   TSH     Musculoskeletal and Integument   Osteoporosis    Recheck DEXA to see if BMD has worsened      Relevant Orders   DG Bone Density     Other   Hypercholesteremia    Not on medications Recheck Lipid panel Recent CMP wnl  Relevant Orders   Lipid panel   Easy bruising    Recent CBC wnl Suspect multifactorial - related to recent DOAC use around TKA, ICS use chronically, and age Continue to monitor       Other Visit Diagnoses    Medicare annual wellness visit,  subsequent    -  Primary   Encounter for screening mammogram for breast cancer       Relevant Orders   MM SCREENING BREAST TOMO BILATERAL   Healthcare maintenance       Relevant Orders   BASIC METABOLIC PANEL WITH GFR   Lipid panel   TSH   Hyponatremia       Relevant Orders   BASIC METABOLIC PANEL WITH GFR      Return in about 6 months (around 01/18/2018) for chronic disease management.  ------------------------------------------------------------------------------------------------------------ The entirety of the information documented in the History of Present Illness, Review of Systems and Physical Exam were personally obtained by me. Portions of this information were initially documented by Raquel Sarna Ratchford, CMA and reviewed by me for thoroughness and accuracy.     Lavon Paganini, MD  [facility]

## 2017-07-21 NOTE — Assessment & Plan Note (Signed)
Not on medications Recheck Lipid panel Recent CMP wnl

## 2017-07-21 NOTE — Patient Instructions (Signed)
Preventive Care 65 Years and Older, Female Preventive care refers to lifestyle choices and visits with your health care provider that can promote health and wellness. What does preventive care include?  A yearly physical exam. This is also called an annual well check.  Dental exams once or twice a year.  Routine eye exams. Ask your health care provider how often you should have your eyes checked.  Personal lifestyle choices, including: ? Daily care of your teeth and gums. ? Regular physical activity. ? Eating a healthy diet. ? Avoiding tobacco and drug use. ? Limiting alcohol use. ? Practicing safe sex. ? Taking low-dose aspirin every day. ? Taking vitamin and mineral supplements as recommended by your health care provider. What happens during an annual well check? The services and screenings done by your health care provider during your annual well check will depend on your age, overall health, lifestyle risk factors, and family history of disease. Counseling Your health care provider may ask you questions about your:  Alcohol use.  Tobacco use.  Drug use.  Emotional well-being.  Home and relationship well-being.  Sexual activity.  Eating habits.  History of falls.  Memory and ability to understand (cognition).  Work and work environment.  Reproductive health.  Screening You may have the following tests or measurements:  Height, weight, and BMI.  Blood pressure.  Lipid and cholesterol levels. These may be checked every 5 years, or more frequently if you are over 50 years old.  Skin check.  Lung cancer screening. You may have this screening every year starting at age 55 if you have a 30-pack-year history of smoking and currently smoke or have quit within the past 15 years.  Fecal occult blood test (FOBT) of the stool. You may have this test every year starting at age 50.  Flexible sigmoidoscopy or colonoscopy. You may have a sigmoidoscopy every 5 years or  a colonoscopy every 10 years starting at age 50.  Hepatitis C blood test.  Hepatitis B blood test.  Sexually transmitted disease (STD) testing.  Diabetes screening. This is done by checking your blood sugar (glucose) after you have not eaten for a while (fasting). You may have this done every 1-3 years.  Bone density scan. This is done to screen for osteoporosis. You may have this done starting at age 65.  Mammogram. This may be done every 1-2 years. Talk to your health care provider about how often you should have regular mammograms.  Talk with your health care provider about your test results, treatment options, and if necessary, the need for more tests. Vaccines Your health care provider may recommend certain vaccines, such as:  Influenza vaccine. This is recommended every year.  Tetanus, diphtheria, and acellular pertussis (Tdap, Td) vaccine. You may need a Td booster every 10 years.  Varicella vaccine. You may need this if you have not been vaccinated.  Zoster vaccine. You may need this after age 60.  Measles, mumps, and rubella (MMR) vaccine. You may need at least one dose of MMR if you were born in 1957 or later. You may also need a second dose.  Pneumococcal 13-valent conjugate (PCV13) vaccine. One dose is recommended after age 65.  Pneumococcal polysaccharide (PPSV23) vaccine. One dose is recommended after age 65.  Meningococcal vaccine. You may need this if you have certain conditions.  Hepatitis A vaccine. You may need this if you have certain conditions or if you travel or work in places where you may be exposed to hepatitis   A.  Hepatitis B vaccine. You may need this if you have certain conditions or if you travel or work in places where you may be exposed to hepatitis B.  Haemophilus influenzae type b (Hib) vaccine. You may need this if you have certain conditions.  Talk to your health care provider about which screenings and vaccines you need and how often you  need them. This information is not intended to replace advice given to you by your health care provider. Make sure you discuss any questions you have with your health care provider. Document Released: 09/08/2015 Document Revised: 05/01/2016 Document Reviewed: 06/13/2015 Elsevier Interactive Patient Education  2017 [employer].

## 2017-07-21 NOTE — Assessment & Plan Note (Addendum)
Stable and asymptomatic Recheck TSH Adjust synthroid dose pending TSH

## 2017-07-22 LAB — LIPID PANEL
CHOL/HDL RATIO: 2.7 (calc) (ref <5.0)
CHOLESTEROL: 250 mg/dL — AB (ref <200)
HDL: 94 mg/dL (ref >50)
LDL CHOLESTEROL (CALC): 136 mg/dL — AB
NON-HDL CHOLESTEROL (CALC): 156 mg/dL — AB (ref <130)
Triglycerides: 95 mg/dL (ref <150)

## 2017-07-22 LAB — TSH: TSH: 1.96 mIU/L (ref 0.40–4.50)

## 2017-07-22 LAB — BASIC METABOLIC PANEL WITH GFR
BUN: 19 mg/dL (ref 7–25)
CO2: 30 mmol/L (ref 20–32)
CREATININE: 0.66 mg/dL (ref 0.60–0.93)
Calcium: 9.6 mg/dL (ref 8.6–10.4)
Chloride: 102 mmol/L (ref 98–110)
GFR, EST NON AFRICAN AMERICAN: 86 mL/min/{1.73_m2} (ref >=60)
GFR, Est African American: 99 mL/min/{1.73_m2} (ref >=60)
Glucose, Bld: 86 mg/dL (ref 65–99)
Potassium: 4.8 mmol/L (ref 3.5–5.3)
SODIUM: 139 mmol/L (ref 135–146)

## 2017-07-22 NOTE — Therapy (Signed)
[facility] [address] [place], [address] Phone: [phone]   Fax:  [phone]  Physical Therapy Treatment/Progress report  Patient Details  Name: [name] [name] MRN: [id number] Date of Birth: [birth year] Referring Provider: Gaynelle Arabian MD   Encounter Date: 07/21/2017  PT End of Session - 07/21/17 1453    Visit Number  10    Number of Visits  18    Date for PT Re-Evaluation  08/11/17    Authorization Type  10    Authorization Time Period  10 (G codes)    PT Start Time  1351    PT Stop Time  1448    PT Time Calculation (min)  57 min    Equipment Utilized During Treatment  Right knee immobilizer    Activity Tolerance  Patient tolerated treatment well;Patient limited by pain;Other (comment) increased swelling, warmth right knee    Behavior During Therapy  WFL for tasks assessed/performed       Past Medical History:  Diagnosis Date  . Allergy   . Allergy    allergy to spandex "had a bad reaction of blisters when using after a surgery"  . 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 Bruises easily   . Complication of anesthesia    reports history of "trouble waking up after surgery", mother developed fevers questionable malignant hyperthermia   . COPD (chronic obstructive pulmonary disease) ([place])   . Dysrhythmia    frequent PVC's  . Family history of adverse reaction to anesthesia    mother with history of malignant hypertension  . GERD (gastroesophageal reflux disease)   . Heart palpitations   . Hyperlipidemia   . Hypothyroidism   . Peripheral vascular disease ([place]) 1980   pulmonary embolism  . Pneumonia   . Pulmonary embolism ([place])   . PVC (premature ventricular contraction)   . Shortness of breath dyspnea    with extertion  . Thyroid disease     Past Surgical History:  Procedure Laterality Date  . abdominal surgery    . APPENDECTOMY    . BLEPHAROPLASTY  Bilateral 11/2014   left eyelid 08/03/2015 Dr. Vickki Muff  . BREAST CYST ASPIRATION Left   . CHOLECYSTECTOMY  03/2004  . COLONOSCOPY    . [place]   fertility testing  . EYE SURGERY Bilateral 2015   cataract excision  . KNEE SURGERY Right    knee arthroscopy-2000; Total knee replacement-2002   . LAPAROTOMY  1969   fertility testing  . patelectomy Right 1972  . Removal of medical meniscus  1972  . TOTAL KNEE ARTHROPLASTY Left 11/04/2016   Procedure: LEFT TOTAL KNEE ARTHROPLASTY;  Surgeon: Gaynelle Arabian, MD;  Location: [facility];  Service: Orthopedics;  Laterality: Left;  requests 57mins  . TOTAL KNEE REVISION Right 06/25/2017   Procedure: RIGHT TOTAL KNEE REVISION;  Surgeon: Gaynelle Arabian, MD;  Location: [facility];  Service: Orthopedics;  Laterality: Right;  . UPPER GI ENDOSCOPY      There were no vitals filed for this visit.  Subjective Assessment - 07/21/17 1356    Subjective  Patient reports beginning meloxicam thursday and is feeling much looser today.  she continues with swelling, stiffness, inflammation that limit mobility in knee.    Patient is accompained by:  Family member husband    Pertinent History  right TKR revision 06/25/2017; left TKR 11/04/2016; arthritis; patient enjoys outdoor activities, hiking  Limitations  Sitting;Standing;Walking;House hold activities    Patient Stated Goals  to walk and move without difficulty and not using AD    Currently in Pain?  Other (Comment) tightness right knee       Objective: Observation: moderate swelling, moderate warmth right knee, improved from previous session Gait: rolling walker independent with improving hip/ knee flexion with swing phase right LE Palpation:decreased soft tissue elasticity with palpable spasms along quadriceps and hamstring muscles right LE AAROM: right knee flexion 0-65/70 degrees  Treatment: Manual therapy: 8 min prior to exercise; goal: pain, spasms, improve ROM STM performed  to right LE quadriceps/hamstring muscles with patient supine lying with right LE supported on pillow; superficial techniques   Therapeutic exercise:patient performed with assistance, verbal and tactile cues of therapist: Supine lying hip/kneeflexion, quad sets with assistance of therapist; isometric hamstring 3 sets10reps alternating with knee extension  Modalities: Electrical stimulation: Russian stim. 10/10 cycle applied (2) electrodes to quadriceps/VMO Right LE with patient reclined with LE supported on pillow; pt. Performing quad sets with each cycle; goal muscle re education High volt estim.clincial program for muscle spasms  (2) electrodes applied to medial/lateral aspect right knee intensity to tolerance with patient in reclined position with LE supported on pillow goal: pain, spasms   Patient response to treatment: Patient demonstrated improved flexibility right knee to 70 degrees with less discomfort. She required moderate assistance to perform exercises with minimal VC for correct technique and alignment. No adverse reaction noted with cryotherapy.     PT Education - 07/21/17 1400    Education provided  Yes    Education Details  continue with ROM exercise, elevation, ice to control swelling, pain    Person(s) Educated  Patient    Methods  Explanation    Comprehension  Verbalized understanding          PT Long Term Goals - 07/21/17 1500      PT LONG TERM GOAL #1   Title  Patient will demonstrate improvement with daily tasks with decreased right knee pain as indicated by LEFS score of 30/80     Baseline  LEFS 15/80     Status  Revised    Target Date  07/29/17      PT LONG TERM GOAL #2   Title  Patient will demonstrate improvement with daily tasks with decreased right knee pain as indicated by LEFS score of 45/80    Baseline  LEFS 15/80     Status  On-going    Target Date  08/11/17      PT LONG TERM GOAL #3   Title  Patient will demonstrate improved functional  community ambulation and decreased fall risk without AD and 10MW time of 10 seconds or less     Baseline  to be assessed    Status  On-going    Target Date  08/11/17      PT LONG TERM GOAL #4   Title  Patient will be independent with home program for pain control and progressive exercise for self managment once discharged from physical therapy    Baseline  limited knoweldge and requires moderate cuing for pain control, exercises and progression    Status  On-going    Target Date  08/11/17      PT LONG TERM GOAL #5   Title  Patient will have AROM right knee flexion to at least 110 or greater to allow improved mobility with transfers and stairs with minimal to no difficulty    Baseline  AROM right knee flexion 0-75 degrees with pain; 07/21/17 0-70 degrees    Status  On-going    Target Date  07/29/17            Plan - 07/21/17 1500    Clinical Impression Statement  Patient demonstrates improvement with decreased inflammation and pain since beginning Meloxicam last week and is progressing slowly with ROM. She should continue to progress with ROM and strength with addition of electrical stimulation to assist muscle re education and pain control.     Rehab Potential  Good    Clinical Impairments Affecting Rehab Potential  (+)acute condition, family support, motivated, previous TKR(-) arthritis, COPD, PVD    PT Frequency  3x / week    PT Duration  6 weeks    PT Treatment/Interventions  Gait training;Neuromuscular re-education;Manual techniques;Patient/family education;Therapeutic exercise;Electrical Stimulation;Cryotherapy    PT Next Visit Plan  pain control, ROM exercise, strength exercises; electrical stimualtion for pain control, re education    PT Home Exercise Plan  elevation, use of ice, ROM for knee extension, flexion       Patient will benefit from skilled therapeutic intervention in order to improve the following deficits and impairments:  Pain, Increased muscle spasms,  Decreased strength, Decreased range of motion, Decreased endurance, Decreased activity tolerance, Difficulty walking, Impaired perceived functional ability  Visit Diagnosis: Stiffness of right knee, not elsewhere classified  Difficulty in walking, not elsewhere classified  Muscle weakness (generalized)   G-Codes - 08/21/2017 1902    Functional Assessment Tool Used (Outpatient Only)  LEFS, ROM, strength, gait, pain, clinical judgment    Functional Limitation  Mobility: Walking and moving around    Mobility: Walking and Moving Around Current Status [phone]  At least 40 percent but less than 60 percent impaired, limited or restricted    Mobility: Walking and Moving Around Goal Status [phone])  At least 1 percent but less than 20 percent impaired, limited or restricted       Problem List Patient Active Problem List   Diagnosis Date Noted  . Easy bruising 07/21/2017  . 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 OA (osteoarthritis) of knee 11/04/2016  . COPD (chronic obstructive pulmonary disease) ([place]) 12/04/2015  . Ovarian mass, left 10/04/2015  . Osteoporosis 09/05/2015  . Skin lesion 08/01/2015  . Chronic infection of sinus 06/30/2015  . H/O varicella 06/30/2015  . Lung nodule, solitary 06/30/2015  . Allergic rhinitis 05/26/2015  . Bilateral cataracts 05/26/2015  . 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 Hypercholesteremia 05/26/2015  . Hypothyroidism 05/26/2015  . Cramps of lower extremity 05/26/2015  . Hemorrhoids, internal 05/26/2015  . Cervical pain 05/26/2015  . 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 Acquired trigger finger 05/26/2015  . Diverticulitis 05/26/2015    Jomarie Longs PT 2017-08-21, 7:05 PM  [facility] [address] [place], [address] Phone: [phone]   Fax:  [phone]  Name: [name] [name] MRN: [id number] Date of Birth: [birth year]

## 2017-07-23 ENCOUNTER — Ambulatory Visit: Payer: PPO | Admitting: Physical Therapy

## 2017-07-23 ENCOUNTER — Encounter: Payer: Self-pay | Admitting: Physical Therapy

## 2017-07-23 ENCOUNTER — Encounter: Payer: Self-pay | Admitting: Family Medicine

## 2017-07-23 ENCOUNTER — Other Ambulatory Visit: Payer: Self-pay

## 2017-07-23 DIAGNOSIS — M25661 Stiffness of right knee, not elsewhere classified: Secondary | ICD-10-CM

## 2017-07-23 DIAGNOSIS — R262 Difficulty in walking, not elsewhere classified: Secondary | ICD-10-CM

## 2017-07-23 DIAGNOSIS — M6281 Muscle weakness (generalized): Secondary | ICD-10-CM

## 2017-07-23 NOTE — Therapy (Signed)
[facility] [address] [place], [address] Phone: [phone]   Fax:  [phone]  Physical Therapy Treatment  Patient Details  Name: [name] [name] MRN: [id number] Date of Birth: [birth year] Referring Provider: Gaynelle Arabian MD   Encounter Date: 07/23/2017  PT End of Session - 07/23/17 1238    Visit Number  11    Number of Visits  18    Date for PT Re-Evaluation  08/11/17    Authorization Type  1    Authorization Time Period  10 (G codes)    PT Start Time  1194    PT Stop Time  1331    PT Time Calculation (min)  49 min    Equipment Utilized During Treatment  Right knee immobilizer    Activity Tolerance  Patient tolerated treatment well;Patient limited by pain;Other (comment) increased swelling, warmth right knee    Behavior During Therapy  WFL for tasks assessed/performed       Past Medical History:  Diagnosis Date  . Allergy   . Allergy    allergy to spandex "had a bad reaction of blisters when using after a surgery"  . 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 Bruises easily   . Complication of anesthesia    reports history of "trouble waking up after surgery", mother developed fevers questionable malignant hyperthermia   . COPD (chronic obstructive pulmonary disease) ([place])   . Dysrhythmia    frequent PVC's  . Family history of adverse reaction to anesthesia    mother with history of malignant hypertension  . GERD (gastroesophageal reflux disease)   . Heart palpitations   . Hyperlipidemia   . Hypothyroidism   . Peripheral vascular disease ([place]) 1980   pulmonary embolism  . Pneumonia   . Pulmonary embolism ([place])   . PVC (premature ventricular contraction)   . Shortness of breath dyspnea    with extertion  . Thyroid disease     Past Surgical History:  Procedure Laterality Date  . abdominal surgery    . APPENDECTOMY    . BLEPHAROPLASTY Bilateral 11/2014   left eyelid 08/03/2015 Dr. Vickki Muff  . BREAST CYST ASPIRATION Left   . CHOLECYSTECTOMY  03/2004  . COLONOSCOPY    . [place]   fertility testing  . EYE SURGERY Bilateral 2015   cataract excision  . KNEE SURGERY Right    knee arthroscopy-2000; Total knee replacement-2002   . LAPAROTOMY  1969   fertility testing  . patelectomy Right 1972  . Removal of medical meniscus  1972  . TOTAL KNEE ARTHROPLASTY Left 11/04/2016   Procedure: LEFT TOTAL KNEE ARTHROPLASTY;  Surgeon: Gaynelle Arabian, MD;  [facility];  Service: Orthopedics;  Laterality: Left;  requests 39mins  . TOTAL KNEE REVISION Right 06/25/2017   Procedure: RIGHT TOTAL KNEE REVISION;  Surgeon: Gaynelle Arabian, MD;  [facility];  Service: Orthopedics;  Laterality: Right;  . UPPER GI ENDOSCOPY      There were no vitals filed for this visit.  Subjective Assessment - 07/23/17 1250    Subjective  Pt reports she can't tolerate the ball adductor squeezes with a ball and has been using a towel due to the sensitivity of her medial R knee.  She reports feeling stiff today.  Pt has been having spasms in her posterior RLE with sitting activities as pressure to this area causes a spasm.  Patient is accompained by:  Family member husband    Pertinent History  right TKR revision 06/25/2017; left TKR 11/04/2016; arthritis; patient enjoys outdoor activities, hiking    Limitations  Sitting;Standing;Walking;House hold activities    Patient Stated Goals  to walk and move without difficulty and not using AD    Currently in Pain?  Yes    Pain Score  1     Pain Location  Knee    Pain Orientation  Right    Pain Descriptors / Indicators  Aching;Tightness        TREATMENT    Manual Therapy:  STM performed to right LE quadriceps/hamstring muscles with patient supine; superficial techniques    Therapeutic Exercise:  Ambulating with RW with cues for heel strike and toe off and for increased R knee flexion x100  ft  Supine AAROM knee flexion and extension x10  Supine hamstring knee flexion isometrics at varying degrees of flexion x30 total  Seated knee flexion contract relax x10  R knee flexion AROM in sitting (in deg): 73    Modalities:  High volt estim. clincial program for muscle spasms (2) electrodes applied to medial/lateral aspect right knee?intensity to tolerance with patient in reclined position with LE supported on pillow goal: pain, spasms.         PT Education - 07/23/17 1238    Education provided  Yes    Education Details  Exercise technique    Person(s) Educated  Patient    Methods  Explanation;Demonstration;Verbal cues    Comprehension  Verbalized understanding;Returned demonstration;Verbal cues required;Need further instruction          PT Long Term Goals - 07/21/17 1500      PT LONG TERM GOAL #1   Title  Patient will demonstrate improvement with daily tasks with decreased right knee pain as indicated by LEFS score of 30/80     Baseline  LEFS 15/80     Status  Revised    Target Date  07/29/17      PT LONG TERM GOAL #2   Title  Patient will demonstrate improvement with daily tasks with decreased right knee pain as indicated by LEFS score of 45/80    Baseline  LEFS 15/80     Status  On-going    Target Date  08/11/17      PT LONG TERM GOAL #3   Title  Patient will demonstrate improved functional community ambulation and decreased fall risk without AD and 10MW time of 10 seconds or less     Baseline  to be assessed    Status  On-going    Target Date  08/11/17      PT LONG TERM GOAL #4   Title  Patient will be independent with home program for pain control and progressive exercise for self managment once discharged from physical therapy    Baseline  limited knoweldge and requires moderate cuing for pain control, exercises and progression    Status  On-going    Target Date  08/11/17      PT LONG TERM GOAL #5   Title  Patient will have AROM right knee flexion  to at least 110 or greater to allow improved mobility with transfers and stairs with minimal to no difficulty    Baseline  AROM right knee flexion 0-75 degrees with pain; 07/21/17 0-70 degrees    Status  On-going    Target Date  07/29/17            Plan -  07/23/17 1324    Clinical Impression Statement  Pt continues to demonstrate main limitation with R knee flexion AROM.  She reported pain with R knee flexion exercises but remained motivated and tolerated all interventions well.  Use of high volt for pain and spasm reduction.  Pt will benefit from skilled PT interventions for improved ROM and decreased pain.     Rehab Potential  Good    Clinical Impairments Affecting Rehab Potential  (+)acute condition, family support, motivated, previous TKR(-) arthritis, COPD, PVD    PT Frequency  3x / week    PT Duration  6 weeks    PT Treatment/Interventions  Gait training;Neuromuscular re-education;Manual techniques;Patient/family education;Therapeutic exercise;Electrical Stimulation;Cryotherapy    PT Next Visit Plan  pain control, ROM exercise, strength exercises; electrical stimualtion for pain control, re education    PT Home Exercise Plan  elevation, use of ice, ROM for knee extension, flexion       Patient will benefit from skilled therapeutic intervention in order to improve the following deficits and impairments:  Pain, Increased muscle spasms, Decreased strength, Decreased range of motion, Decreased endurance, Decreased activity tolerance, Difficulty walking, Impaired perceived functional ability  Visit Diagnosis: Stiffness of right knee, not elsewhere classified  Difficulty in walking, not elsewhere classified  Muscle weakness (generalized)   G-Codes - 08-10-2017 1902    Functional Assessment Tool Used (Outpatient Only)  LEFS, ROM, strength, gait, pain, clinical judgment    Functional Limitation  Mobility: Walking and moving around    Mobility: Walking and Moving Around Current Status  [phone]  At least 40 percent but less than 60 percent impaired, limited or restricted    Mobility: Walking and Moving Around Goal Status [phone])  At least 1 percent but less than 20 percent impaired, limited or restricted       Problem List Patient Active Problem List   Diagnosis Date Noted  . Easy bruising 07/21/2017  . 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 OA (osteoarthritis) of knee 11/04/2016  . COPD (chronic obstructive pulmonary disease) ([place]) 12/04/2015  . Ovarian mass, left 10/04/2015  . Osteoporosis 09/05/2015  . Skin lesion 08/01/2015  . Chronic infection of sinus 06/30/2015  . H/O varicella 06/30/2015  . Lung nodule, solitary 06/30/2015  . Allergic rhinitis 05/26/2015  . Bilateral cataracts 05/26/2015  . 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 Hypercholesteremia 05/26/2015  . Hypothyroidism 05/26/2015  . Cramps of lower extremity 05/26/2015  . Hemorrhoids, internal 05/26/2015  . Cervical pain 05/26/2015  . 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 Acquired trigger finger 05/26/2015  . Diverticulitis 05/26/2015    Collie Siad PT, DPT 07/23/2017, 1:44 PM  [facility] [address], [place], [address] Phone: [phone]   Fax:  [phone]  Name: [name] [name] MRN: [id number] Date of Birth: October 27, 1940

## 2017-07-24 ENCOUNTER — Telehealth: Payer: Self-pay

## 2017-07-24 ENCOUNTER — Ambulatory Visit: Payer: PPO | Admitting: Physical Therapy

## 2017-07-24 ENCOUNTER — Encounter: Payer: Self-pay | Admitting: Physical Therapy

## 2017-07-24 DIAGNOSIS — R262 Difficulty in walking, not elsewhere classified: Secondary | ICD-10-CM

## 2017-07-24 DIAGNOSIS — M6281 Muscle weakness (generalized): Secondary | ICD-10-CM

## 2017-07-24 DIAGNOSIS — M25661 Stiffness of right knee, not elsewhere classified: Secondary | ICD-10-CM | POA: Diagnosis not present

## 2017-07-24 NOTE — Telephone Encounter (Signed)
-----   Message from Virginia Crews, MD sent at 07/24/2017  9:22 AM EST ----- Normal kidney function, electrolytes, thyroid function. Cholesterol is high.  10 year risk of heart disease/stroke is 16%.  This has been steadily increasing over the last few years.  Would recommend a statin medication to lower cholesterol and risk of heart attack/stroke.  If patient agreeable, would start Atorvastatin 40mg  daily.   Then would like to followup in ~3 months on liver function and cholesterol.  Virginia Crews, MD, MPH [facility] 07/24/2017 9:22 AM

## 2017-07-24 NOTE — Telephone Encounter (Signed)
Pt advised. She states she was on Statin's in the past, which caused myalgias. She declines starting the medication due to her age and the side effects of the medication.

## 2017-07-24 NOTE — Therapy (Signed)
[facility] [address] [place], [address] Phone: [phone]   Fax:  [phone]  Physical Therapy Treatment  Patient Details  Name: [name] [name] MRN: [id number] Date of Birth: Nov 22, 1940 Referring Provider: Gaynelle Arabian MD   Encounter Date: 07/24/2017  PT End of Session - 07/24/17 1309    Visit Number  12    Number of Visits  18    Date for PT Re-Evaluation  08/11/17    Authorization Type  11    Authorization Time Period  20(G code)    PT Start Time  0623    PT Stop Time  1352    PT Time Calculation (min)  46 min    Equipment Utilized During Treatment  Right knee immobilizer    Activity Tolerance  Patient tolerated treatment well;Patient limited by pain;Other (comment) increased swelling, warmth right knee    Behavior During Therapy  WFL for tasks assessed/performed       Past Medical History:  Diagnosis Date  . Allergy   . Allergy    allergy to spandex "had a bad reaction of blisters when using after a surgery"  . 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 Bruises easily   . Complication of anesthesia    reports history of "trouble waking up after surgery", mother developed fevers questionable malignant hyperthermia   . COPD (chronic obstructive pulmonary disease) ([place])   . Dysrhythmia    frequent PVC's  . Family history of adverse reaction to anesthesia    mother with history of malignant hypertension  . GERD (gastroesophageal reflux disease)   . Heart palpitations   . Hyperlipidemia   . Hypothyroidism   . Peripheral vascular disease ([place]) 1980   pulmonary embolism  . Pneumonia   . Pulmonary embolism ([place])   . PVC (premature ventricular contraction)   . Shortness of breath dyspnea    with extertion  . Thyroid disease     Past Surgical History:  Procedure Laterality Date  . abdominal surgery    . APPENDECTOMY    . BLEPHAROPLASTY Bilateral 11/2014   left eyelid 08/03/2015 Dr. Vickki Muff  . BREAST CYST ASPIRATION Left   . CHOLECYSTECTOMY  03/2004  . COLONOSCOPY    . [place]   fertility testing  . EYE SURGERY Bilateral 2015   cataract excision  . KNEE SURGERY Right    knee arthroscopy-2000; Total knee replacement-2002   . LAPAROTOMY  1969   fertility testing  . patelectomy Right 1972  . Removal of medical meniscus  1972  . TOTAL KNEE ARTHROPLASTY Left 11/04/2016   Procedure: LEFT TOTAL KNEE ARTHROPLASTY;  Surgeon: Gaynelle Arabian, MD;  Location: [facility];  Service: Orthopedics;  Laterality: Left;  requests 18mins  . TOTAL KNEE REVISION Right 06/25/2017   Procedure: RIGHT TOTAL KNEE REVISION;  Surgeon: Gaynelle Arabian, MD;  Location: [facility];  Service: Orthopedics;  Laterality: Right;  . UPPER GI ENDOSCOPY      There were no vitals filed for this visit.  Subjective Assessment - 07/24/17 1309    Subjective  Patient reports she is sore and stll stiff in right knee. She has been exercising diligently to increase her ROM    Patient is accompained by:  Family member husband    Pertinent History  right TKR revision 06/25/2017; left TKR 11/04/2016; arthritis; patient enjoys outdoor activities, hiking    Limitations  Sitting;Standing;Walking;House hold  activities    Patient Stated Goals  to walk and move without difficulty and not using AD    Currently in Pain?  Yes    Pain Score  2     Pain Location  Knee    Pain Orientation  Right    Pain Descriptors / Indicators  Sore;Tightness    Pain Type  Surgical pain 06/25/2017    Pain Onset  1 to 4 weeks ago    Pain Frequency  Intermittent      Objective: AAROM: right knee flexion pre treatment 75 degrees/ post treatment up to 77 degrees  Treatment:   Manual Therapy: 8 min. : goal: improve soft tissue elasticity, knee ROM STM performed to right LE quadriceps/hamstring muscles with patient supine; superficial techniques    Therapeutic Exercise: patient performed  with instruction, verbal and tactile cues of therapist: goal: improve LEFS, gait, independent with home program Seated  MAI knee flexion right LE 3 sets, hold 5 seconds each  AAROM right knee flexion and extension mubltiple repetitions with subamax effort to avoid spasms in hamstring   Modalities: Electrical stimulation: 15 min. Russian stim. 10/10 cycle applied (2) electrodes to quadriceps/VMO Right LE with patient reclined with LE supported on pillow; pt. Performing quad sets with each cycle; goal muscle re education High volt estim.clincial program for muscle spasms  (2) electrodes applied to medial/lateral aspect right knee intensity to tolerance with patient in reclined position with LE supported on pillow goal: pain, spasms Ice pack applied to right knee during estim. : goal : pain; no adverse reactions noted  Nustep at end of session: independent (unbilled time) level #3 intensity   Patient response to treatment: Patient demonstrated improved flexibility into right knee flexion with less difficulty and discomfort.She required moderate assistance to perform exercises with minimal VC for correct technique and alignment.       PT Education - 07/24/17 1325    Education provided  Yes    Education Details  Exercise technique, intensity to not exacerbate spasms/pain    Person(s) Educated  Patient    Methods  Explanation;Demonstration;Verbal cues    Comprehension  Verbalized understanding;Returned demonstration;Verbal cues required;Need further instruction          PT Long Term Goals - 07/21/17 1500      PT LONG TERM GOAL #1   Title  Patient will demonstrate improvement with daily tasks with decreased right knee pain as indicated by LEFS score of 30/80     Baseline  LEFS 15/80     Status  Revised    Target Date  07/29/17      PT LONG TERM GOAL #2   Title  Patient will demonstrate improvement with daily tasks with decreased right knee pain as indicated by LEFS score of 45/80     Baseline  LEFS 15/80     Status  On-going    Target Date  08/11/17      PT LONG TERM GOAL #3   Title  Patient will demonstrate improved functional community ambulation and decreased fall risk without AD and 10MW time of 10 seconds or less     Baseline  to be assessed    Status  On-going    Target Date  08/11/17      PT LONG TERM GOAL #4   Title  Patient will be independent with home program for pain control and progressive exercise for self managment once discharged from physical therapy    Baseline  limited knoweldge and requires  moderate cuing for pain control, exercises and progression    Status  On-going    Target Date  08/11/17      PT LONG TERM GOAL #5   Title  Patient will have AROM right knee flexion to at least 110 or greater to allow improved mobility with transfers and stairs with minimal to no difficulty    Baseline  AROM right knee flexion 0-75 degrees with pain; 07/21/17 0-70 degrees    Status  On-going    Target Date  07/29/17            Plan - 07/24/17 1400    Clinical Impression Statement  Patient demonstrates steady progression with imrpoved knee flexion to 75 degrees. She conitnues with limitations of pain, swelling and inflammation and should continue to improve with additional physical therapy intervention.     Rehab Potential  Good    Clinical Impairments Affecting Rehab Potential  (+)acute condition, family support, motivated, previous TKR(-) arthritis, COPD, PVD    PT Frequency  3x / week    PT Duration  6 weeks    PT Treatment/Interventions  Gait training;Neuromuscular re-education;Manual techniques;Patient/family education;Therapeutic exercise;Electrical Stimulation;Cryotherapy    PT Next Visit Plan  pain control, ROM exercise, strength exercises; electrical stimualtion for pain control, re education    PT Home Exercise Plan  elevation, use of ice, ROM for knee extension, flexion       Patient will benefit from skilled therapeutic intervention in  order to improve the following deficits and impairments:  Pain, Increased muscle spasms, Decreased strength, Decreased range of motion, Decreased endurance, Decreased activity tolerance, Difficulty walking, Impaired perceived functional ability  Visit Diagnosis: Stiffness of right knee, not elsewhere classified  Difficulty in walking, not elsewhere classified  Muscle weakness (generalized)     Problem List Patient Active Problem List   Diagnosis Date Noted  . Easy bruising 07/21/2017  . 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 OA (osteoarthritis) of knee 11/04/2016  . COPD (chronic obstructive pulmonary disease) ([place]) 12/04/2015  . Ovarian mass, left 10/04/2015  . Osteoporosis 09/05/2015  . Skin lesion 08/01/2015  . Chronic infection of sinus 06/30/2015  . H/O varicella 06/30/2015  . Lung nodule, solitary 06/30/2015  . Allergic rhinitis 05/26/2015  . Bilateral cataracts 05/26/2015  . 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 Hypercholesteremia 05/26/2015  . Hypothyroidism 05/26/2015  . Cramps of lower extremity 05/26/2015  . Hemorrhoids, internal 05/26/2015  . Cervical pain 05/26/2015  . 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 Acquired trigger finger 05/26/2015  . Diverticulitis 05/26/2015    Jomarie Longs PT 07/25/2017, 4:42 AM  [facility] [address] [place], [address] Phone: [phone]   Fax:  [phone]  Name: [name] [name] MRN: [id number] Date of Birth: [birth year]

## 2017-07-28 ENCOUNTER — Encounter: Payer: Self-pay | Admitting: Physical Therapy

## 2017-07-28 ENCOUNTER — Ambulatory Visit: Payer: PPO | Attending: Orthopedic Surgery | Admitting: Physical Therapy

## 2017-07-28 DIAGNOSIS — M6281 Muscle weakness (generalized): Secondary | ICD-10-CM

## 2017-07-28 DIAGNOSIS — M25661 Stiffness of right knee, not elsewhere classified: Secondary | ICD-10-CM

## 2017-07-28 DIAGNOSIS — R262 Difficulty in walking, not elsewhere classified: Secondary | ICD-10-CM | POA: Diagnosis not present

## 2017-07-28 DIAGNOSIS — M25662 Stiffness of left knee, not elsewhere classified: Secondary | ICD-10-CM | POA: Insufficient documentation

## 2017-07-28 NOTE — Therapy (Signed)
[facility] [address], [place], [address] Phone: [phone]   Fax:  [phone]  Physical Therapy Treatment  Patient Details  Name: [name] [name] MRN: [id number] Date of Birth: 1941-04-22 Referring Provider: Gaynelle Arabian MD   Encounter Date: 07/28/2017  PT End of Session - 07/28/17 1420    Visit Number  13    Number of Visits  18    Date for PT Re-Evaluation  08/11/17    Authorization Type  13    Authorization Time Period  20(G code)    PT Start Time  7035    PT Stop Time  0093    PT Time Calculation (min)  58 min    Equipment Utilized During Treatment  Right knee immobilizer    Activity Tolerance  Patient tolerated treatment well;Patient limited by pain;Other (comment) increased swelling, warmth right knee    Behavior During Therapy  WFL for tasks assessed/performed       Past Medical History:  Diagnosis Date  . Allergy   . Allergy    allergy to spandex "had a bad reaction of blisters when using after a surgery"  . 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 Bruises easily   . Complication of anesthesia    reports history of "trouble waking up after surgery", mother developed fevers questionable malignant hyperthermia   . COPD (chronic obstructive pulmonary disease) ([place])   . Dysrhythmia    frequent PVC's  . Family history of adverse reaction to anesthesia    mother with history of malignant hypertension  . GERD (gastroesophageal reflux disease)   . Heart palpitations   . Hyperlipidemia   . Hypothyroidism   . Peripheral vascular disease ([place]) 1980   pulmonary embolism  . Pneumonia   . Pulmonary embolism ([place])   . PVC (premature ventricular contraction)   . Shortness of breath dyspnea    with extertion  . Thyroid disease     Past Surgical History:  Procedure Laterality Date  . abdominal surgery    . APPENDECTOMY    . BLEPHAROPLASTY Bilateral 11/2014   left eyelid 08/03/2015 Dr. Vickki Muff  . BREAST CYST ASPIRATION Left   . CHOLECYSTECTOMY  03/2004  . COLONOSCOPY    . [place]   fertility testing  . EYE SURGERY Bilateral 2015   cataract excision  . KNEE SURGERY Right    knee arthroscopy-2000; Total knee replacement-2002   . LAPAROTOMY  1969   fertility testing  . patelectomy Right 1972  . Removal of medical meniscus  1972  . TOTAL KNEE ARTHROPLASTY Left 11/04/2016   Procedure: LEFT TOTAL KNEE ARTHROPLASTY;  Surgeon: Gaynelle Arabian, MD;  Location: [facility];  Service: Orthopedics;  Laterality: Left;  requests 73mins  . TOTAL KNEE REVISION Right 06/25/2017   Procedure: RIGHT TOTAL KNEE REVISION;  Surgeon: Gaynelle Arabian, MD;  Location: [facility];  Service: Orthopedics;  Laterality: Right;  . UPPER GI ENDOSCOPY      There were no vitals filed for this visit.  Subjective Assessment - 07/28/17 1300    Subjective  Patient reports she is sore and stll stiff in right knee. She has been exercising diligently to increase her ROM and feels she is improving. She has been walking short distances at home with cane/no AD.    Patient is accompained by:  Family member husband    Pertinent History  right TKR revision 06/25/2017;  left TKR 11/04/2016; arthritis; patient enjoys outdoor activities, hiking    Limitations  Sitting;Standing;Walking;House hold activities    Patient Stated Goals  to walk and move without difficulty and not using AD    Currently in Pain?  Yes    Pain Score  2     Pain Location  Knee    Pain Orientation  Right    Pain Descriptors / Indicators  Aching;Tightness;Sore    Pain Type  Surgical pain 06/25/2017    Pain Onset  1 to 4 weeks ago    Pain Frequency  Intermittent       Objective: AAROM: right knee flexion pre treatment 85 degrees/ post treatment up to 87 degrees Gait: rolling walker for balance, improved heel toe pattern as compared to previous session Observation/palpation: right knee with mild  swelling and warmth  Treatment:   Manual Therapy: 8 min. : goal: improve soft tissue elasticity, knee ROM STM performed to right LE quadriceps/hamstring muscles with patient sittin; superficial techniques    Therapeutic Exercise: patient performed with instruction, verbal and tactile cues of therapist: goal: improve LEFS, gait, independent with home program Seated  MAI knee flexion right LE 3 sets, hold 5 seconds each  AAROM right knee flexion and extension multiple repetitions with subamax effort to avoid spasms in hamstring Leg press 25# bilateral x 15 reps with concentration on equal distribution of weight and activation of right quadriceps Demonstrated sit to stand from elevated surface, ~25" to improve quad strength and proper mini squatting at counter with correct alignment SLR supine with slight knee flexion ~20 degrees x 10 reps  Modalities: Electrical stimulation: 15 min. Russian stim. 10/10 cycle applied (2) electrodes to quadriceps/VMORightLE with patient reclined with LE supported on pillow; pt. Performing quad sets with each cycle; goal muscle re education High volt estim.clincial program for muscle spasms (2) electrodes applied to medial/lateral aspect right kneeintensity to tolerance with patient in reclined position with LE supported on pillow goal: pain, spasms Ice pack applied to right knee during estim. : goal : pain; no adverse reactions noted  Nustep at beginning of session: independent (unbilled time) level #3 intensity  Patient response to treatment:Patient demonstrated improved flexibility into right knee flexion with decreased discomfort following STM and exercise. Improved quad control following estim. Required minimal cuing for exercises for technique, proper alignment.    PT Education - 07/28/17 1223    Education provided  Yes    Education Details  exercise technique for counter mini squats, sit to stand off high surface, leg press     Person(s)  Educated  Patient    Methods  Explanation;Demonstration;Verbal cues    Comprehension  Verbalized understanding;Verbal cues required;Returned demonstration;Need further instruction          PT Long Term Goals - 07/21/17 1500      PT LONG TERM GOAL #1   Title  Patient will demonstrate improvement with daily tasks with decreased right knee pain as indicated by LEFS score of 30/80     Baseline  LEFS 15/80     Status  Revised    Target Date  07/29/17      PT LONG TERM GOAL #2   Title  Patient will demonstrate improvement with daily tasks with decreased right knee pain as indicated by LEFS score of 45/80    Baseline  LEFS 15/80     Status  On-going    Target Date  08/11/17      PT LONG TERM GOAL #3  Title  Patient will demonstrate improved functional community ambulation and decreased fall risk without AD and 10MW time of 10 seconds or less     Baseline  to be assessed    Status  On-going    Target Date  08/11/17      PT LONG TERM GOAL #4   Title  Patient will be independent with home program for pain control and progressive exercise for self managment once discharged from physical therapy    Baseline  limited knoweldge and requires moderate cuing for pain control, exercises and progression    Status  On-going    Target Date  08/11/17      PT LONG TERM GOAL #5   Title  Patient will have AROM right knee flexion to at least 110 or greater to allow improved mobility with transfers and stairs with minimal to no difficulty    Baseline  AROM right knee flexion 0-75 degrees with pain; 07/21/17 0-70 degrees    Status  On-going    Target Date  07/29/17            Plan - 07/28/17 1301    Clinical Impression Statement  Patient demonstrates improved knee flexion to 85 -87 degrees, more control with quads, bent knee SLR, walking some with cane at home. continues with decreased strength and swelling, inflammaiton decreasing steadily. She should continue to improve with additional  physical therapy intervention.     Rehab Potential  Good    Clinical Impairments Affecting Rehab Potential  (+)acute condition, family support, motivated, previous TKR(-) arthritis, COPD, PVD    PT Frequency  3x / week    PT Duration  6 weeks    PT Treatment/Interventions  Gait training;Neuromuscular re-education;Manual techniques;Patient/family education;Therapeutic exercise;Electrical Stimulation;Cryotherapy    PT Next Visit Plan  pain control, ROM exercise, strength exercises; electrical stimualtion for pain control, re education    PT Home Exercise Plan  elevation, use of ice, ROM for knee extension, flexion       Patient will benefit from skilled therapeutic intervention in order to improve the following deficits and impairments:  Pain, Increased muscle spasms, Decreased strength, Decreased range of motion, Decreased endurance, Decreased activity tolerance, Difficulty walking, Impaired perceived functional ability  Visit Diagnosis: Stiffness of right knee, not elsewhere classified  Difficulty in walking, not elsewhere classified  Muscle weakness (generalized)     Problem List Patient Active Problem List   Diagnosis Date Noted  . Easy bruising 07/21/2017  . 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 OA (osteoarthritis) of knee 11/04/2016  . COPD (chronic obstructive pulmonary disease) ([place]) 12/04/2015  . Ovarian mass, left 10/04/2015  . Osteoporosis 09/05/2015  . Skin lesion 08/01/2015  . Chronic infection of sinus 06/30/2015  . H/O varicella 06/30/2015  . Lung nodule, solitary 06/30/2015  . Allergic rhinitis 05/26/2015  . Bilateral cataracts 05/26/2015  . 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 Hypercholesteremia 05/26/2015  . Hypothyroidism 05/26/2015  . Cramps of lower extremity 05/26/2015  . Hemorrhoids, internal 05/26/2015  . Cervical pain 05/26/2015  . 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 Acquired trigger finger 05/26/2015  . Diverticulitis  05/26/2015    Jomarie Longs PT 07/28/2017, 2:25 PM  [facility] [address] [place], [address] Phone: [phone]   Fax:  [phone]  Name: [name] [name] MRN: [id number] Date of Birth: 1941/04/25

## 2017-07-29 ENCOUNTER — Encounter: Payer: Self-pay | Admitting: Physical Therapy

## 2017-07-29 ENCOUNTER — Ambulatory Visit: Payer: PPO | Admitting: Physical Therapy

## 2017-07-29 DIAGNOSIS — R262 Difficulty in walking, not elsewhere classified: Secondary | ICD-10-CM

## 2017-07-29 DIAGNOSIS — M6281 Muscle weakness (generalized): Secondary | ICD-10-CM

## 2017-07-29 DIAGNOSIS — Z471 Aftercare following joint replacement surgery: Secondary | ICD-10-CM | POA: Diagnosis not present

## 2017-07-29 DIAGNOSIS — M25662 Stiffness of left knee, not elsewhere classified: Secondary | ICD-10-CM

## 2017-07-29 DIAGNOSIS — M25661 Stiffness of right knee, not elsewhere classified: Secondary | ICD-10-CM | POA: Diagnosis not present

## 2017-07-29 DIAGNOSIS — Z96651 Presence of right artificial knee joint: Secondary | ICD-10-CM | POA: Diagnosis not present

## 2017-07-29 NOTE — Therapy (Signed)
[facility] [address] [place], [address] Phone: [phone]   Fax:  [phone]  Physical Therapy Treatment  Patient Details  Name: [name] [name] MRN: [id number] Date of Birth: 10/09/1940 Referring Provider: Gaynelle Arabian MD   Encounter Date: 07/29/2017  PT End of Session - 07/29/17 1307    Visit Number  14    Number of Visits  18    Date for PT Re-Evaluation  08/11/17    Authorization Type  14    Authorization Time Period  20(G code)    PT Start Time  1301    PT Stop Time  1402    PT Time Calculation (min)  61 min    Equipment Utilized During Treatment  --    Activity Tolerance  Patient tolerated treatment well;Other (comment) increased swelling, warmth right knee    Behavior During Therapy  WFL for tasks assessed/performed       Past Medical History:  Diagnosis Date  . Allergy   . Allergy    allergy to spandex "had a bad reaction of blisters when using after a surgery"  . 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 Bruises easily   . Complication of anesthesia    reports history of "trouble waking up after surgery", mother developed fevers questionable malignant hyperthermia   . COPD (chronic obstructive pulmonary disease) ([place])   . Dysrhythmia    frequent PVC's  . Family history of adverse reaction to anesthesia    mother with history of malignant hypertension  . GERD (gastroesophageal reflux disease)   . Heart palpitations   . Hyperlipidemia   . Hypothyroidism   . Peripheral vascular disease ([place]) 1980   pulmonary embolism  . Pneumonia   . Pulmonary embolism ([place])   . PVC (premature ventricular contraction)   . Shortness of breath dyspnea    with extertion  . Thyroid disease     Past Surgical History:  Procedure Laterality Date  . abdominal surgery    . APPENDECTOMY    . BLEPHAROPLASTY Bilateral 11/2014   left eyelid 08/03/2015 Dr. Vickki Muff  . BREAST  CYST ASPIRATION Left   . CHOLECYSTECTOMY  03/2004  . COLONOSCOPY    . [place]   fertility testing  . EYE SURGERY Bilateral 2015   cataract excision  . KNEE SURGERY Right    knee arthroscopy-2000; Total knee replacement-2002   . LAPAROTOMY  1969   fertility testing  . patelectomy Right 1972  . Removal of medical meniscus  1972  . TOTAL KNEE ARTHROPLASTY Left 11/04/2016   Procedure: LEFT TOTAL KNEE ARTHROPLASTY;  Surgeon: Gaynelle Arabian, MD;  Location: [facility];  Service: Orthopedics;  Laterality: Left;  requests 77mins  . TOTAL KNEE REVISION Right 06/25/2017   Procedure: RIGHT TOTAL KNEE REVISION;  Surgeon: Gaynelle Arabian, MD;  Location: [facility];  Service: Orthopedics;  Laterality: Right;  . UPPER GI ENDOSCOPY      There were no vitals filed for this visit.  Subjective Assessment - 07/29/17 1303    Subjective  Patient reports she is stiff today, improving overall. No concerns following previous session.    Patient is accompained by:  Family member husband    Pertinent History  right TKR revision 06/25/2017; left TKR 11/04/2016; arthritis; patient enjoys outdoor activities, hiking    Limitations  Sitting;Standing;Walking;House hold activities    Patient Stated Goals  to walk and  move without difficulty and not using AD    Currently in Pain?  Yes    Pain Score  2     Pain Location  Knee    Pain Orientation  Right    Pain Descriptors / Indicators  Aching;Tightness;Sore    Pain Type  Surgical pain 06/25/2017    Pain Onset  More than a month ago    Pain Frequency  Intermittent       Objective: AAROM: right knee flexion pre treatment 80 degrees/ post treatment up to 85 degrees Gait: rolling walker for balance, improved heel toe pattern as compared to previous session Observation/palpation: right knee with mild swelling and warmth, increased from previous session  Treatment:  Manual Therapy:8 min. : goal: improve soft tissue elasticity, knee ROM STM  performed to right LE quadriceps/hamstring muscles with patient sitting; superficial techniques    Therapeutic Exercise:patient performed with instruction, verbal and tactile cues of therapist: goal: improve LEFS, gait, independent with home program Seated  MAI knee flexion right LE 3 sets, hold 5 seconds each  AAROM right knee flexion and extension multiple repetitions with subamax effort to avoid spasms in hamstring Supine lying performed with assistance: 3 sets of 10 reps each flexion of right hip/knee followed by isometric hamstring setting 3 x 10 reps at end range  Modalities: Electrical stimulation:15 min.Russian stim. 10/10 cycle applied (2) electrodes to quadriceps/VMORightLE with patient reclined with LE supported on pillow; pt. Performing quad sets with each cycle; goal muscle re education High volt estim.clincial program for muscle spasms (2) electrodes applied to lateral aspect right knee and hamstringintensity to tolerance with patient in reclined position with LE supported on pillow goal: pain, spasms Ice pack applied to right knee during estim. : goal : pain; no adverse reactions noted  Nustep at end of session: independent (unbilled time) level #3 intensity x 10 min.  Patient response to treatment:patient demonstrated decreased swelling, pain following treatment with STM, modalities. She was able to perform ROM exercises with less discomfort following estim. And ice. Patient reported decreased stiffness and able to walk with less discomfort at end of session.       PT Education - 07/29/17 1305    Education provided  Yes    Education Details  exercise instruction    Person(s) Educated  Patient    Methods  Explanation;Demonstration;Verbal cues    Comprehension  Verbalized understanding;Returned demonstration;Verbal cues required          PT Long Term Goals - 07/29/17 1338      PT LONG TERM GOAL #1   Title  Patient will demonstrate improvement with  daily tasks with decreased right knee pain as indicated by LEFS score of 30/80     Baseline  LEFS 15/80: 07/29/17 30/80    Status  Achieved      PT LONG TERM GOAL #2   Title  Patient will demonstrate improvement with daily tasks with decreased right knee pain as indicated by LEFS score of 45/80    Baseline  LEFS 15/80     Status  On-going    Target Date  08/11/17      PT LONG TERM GOAL #3   Title  Patient will demonstrate improved functional community ambulation and decreased fall risk without AD and 10MW time of 10 seconds or less     Status  On-going    Target Date  08/11/17      PT LONG TERM GOAL #4   Title  Patient will be  independent with home program for pain control and progressive exercise for self managment once discharged from physical therapy    Baseline  limited knoweldge and requires moderate cuing for pain control, exercises and progression    Status  On-going    Target Date  08/11/17      PT LONG TERM GOAL #5   Title  Patient will have AROM right knee flexion to at least 110 or greater to allow improved mobility with transfers and stairs with minimal to no difficulty    Baseline  AROM right knee flexion 0-75 degrees with pain; 07/21/17 0-70 degrees; 07/29/17 0-85 degrees    Status  Revised    Target Date  08/11/17            Plan - 07/29/17 1327    Clinical Impression Statement  Patient limited today by increased swelling and pain in right knee. She is beginning to walk at home with [facility] and is exercising to improve ROM and this may be contributing to mild increased swelling today. She responded well to modalities and ROM exercises and should continue to progress with additional physical therapy intervention.     Rehab Potential  Good    Clinical Impairments Affecting Rehab Potential  (+)acute condition, family support, motivated, previous TKR(-) arthritis, COPD, PVD    PT Frequency  3x / week    PT Duration  6 weeks    PT Treatment/Interventions  Gait  training;Neuromuscular re-education;Manual techniques;Patient/family education;Therapeutic exercise;Electrical Stimulation;Cryotherapy    PT Next Visit Plan  pain control, ROM exercise, strength exercises; electrical stimualtion for pain control, re education    PT Home Exercise Plan  elevation, use of ice, ROM for knee extension, flexion       Patient will benefit from skilled therapeutic intervention in order to improve the following deficits and impairments:  Pain, Increased muscle spasms, Decreased strength, Decreased range of motion, Decreased endurance, Decreased activity tolerance, Difficulty walking, Impaired perceived functional ability  Visit Diagnosis: Stiffness of right knee, not elsewhere classified  Difficulty in walking, not elsewhere classified  Muscle weakness (generalized)  Stiffness of left knee, not elsewhere classified     Problem List Patient Active Problem List   Diagnosis Date Noted  . Easy bruising 07/21/2017  . 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 OA (osteoarthritis) of knee 11/04/2016  . COPD (chronic obstructive pulmonary disease) ([place]) 12/04/2015  . Ovarian mass, left 10/04/2015  . Osteoporosis 09/05/2015  . Skin lesion 08/01/2015  . Chronic infection of sinus 06/30/2015  . H/O varicella 06/30/2015  . Lung nodule, solitary 06/30/2015  . Allergic rhinitis 05/26/2015  . Bilateral cataracts 05/26/2015  . 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 Hypercholesteremia 05/26/2015  . Hypothyroidism 05/26/2015  . Cramps of lower extremity 05/26/2015  . Hemorrhoids, internal 05/26/2015  . Cervical pain 05/26/2015  . 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 Acquired trigger finger 05/26/2015  . Diverticulitis 05/26/2015    Jomarie Longs PT 07/29/2017, 6:07 PM  [facility] [address] [place], [address] Phone: [phone]   Fax:  [phone]  Name: [name] [name] MRN: [id number] Date of Birth: Sep 29, 1940

## 2017-07-30 ENCOUNTER — Encounter: Payer: PPO | Admitting: Physical Therapy

## 2017-07-30 ENCOUNTER — Encounter: Payer: PPO | Admitting: Family Medicine

## 2017-07-31 ENCOUNTER — Ambulatory Visit: Payer: PPO | Admitting: Physical Therapy

## 2017-07-31 ENCOUNTER — Encounter: Payer: Self-pay | Admitting: Physical Therapy

## 2017-07-31 DIAGNOSIS — R262 Difficulty in walking, not elsewhere classified: Secondary | ICD-10-CM

## 2017-07-31 DIAGNOSIS — M6281 Muscle weakness (generalized): Secondary | ICD-10-CM

## 2017-07-31 DIAGNOSIS — M25661 Stiffness of right knee, not elsewhere classified: Secondary | ICD-10-CM

## 2017-07-31 NOTE — Therapy (Signed)
[facility] [address] [place], [address] Phone: [phone]   Fax:  [phone]  Physical Therapy Treatment  Patient Details  Name: [name] [name] MRN: [id number] Date of Birth: 01/02/1941 Referring Provider: Gaynelle Arabian MD   Encounter Date: 07/31/2017  PT End of Session - 07/31/17 1435    Visit Number  15    Number of Visits  18    Date for PT Re-Evaluation  08/11/17    Authorization Type  15    Authorization Time Period  20(G code)    PT Start Time  3716    PT Stop Time  1433    PT Time Calculation (min)  87 min    Activity Tolerance  Patient tolerated treatment well;Other (comment) increased swelling, warmth right knee    Behavior During Therapy  WFL for tasks assessed/performed       Past Medical History:  Diagnosis Date  . Allergy   . Allergy    allergy to spandex "had a bad reaction of blisters when using after a surgery"  . 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 Bruises easily   . Complication of anesthesia    reports history of "trouble waking up after surgery", mother developed fevers questionable malignant hyperthermia   . COPD (chronic obstructive pulmonary disease) ([place])   . Dysrhythmia    frequent PVC's  . Family history of adverse reaction to anesthesia    mother with history of malignant hypertension  . GERD (gastroesophageal reflux disease)   . Heart palpitations   . Hyperlipidemia   . Hypothyroidism   . Peripheral vascular disease ([place]) 1980   pulmonary embolism  . Pneumonia   . Pulmonary embolism ([place])   . PVC (premature ventricular contraction)   . Shortness of breath dyspnea    with extertion  . Thyroid disease     Past Surgical History:  Procedure Laterality Date  . abdominal surgery    . APPENDECTOMY    . BLEPHAROPLASTY Bilateral 11/2014   left eyelid 08/03/2015 Dr. Vickki Muff  . BREAST CYST ASPIRATION Left   . CHOLECYSTECTOMY   03/2004  . COLONOSCOPY    . [place]   fertility testing  . EYE SURGERY Bilateral 2015   cataract excision  . KNEE SURGERY Right    knee arthroscopy-2000; Total knee replacement-2002   . LAPAROTOMY  1969   fertility testing  . patelectomy Right 1972  . Removal of medical meniscus  1972  . TOTAL KNEE ARTHROPLASTY Left 11/04/2016   Procedure: LEFT TOTAL KNEE ARTHROPLASTY;  Surgeon: Gaynelle Arabian, MD;  Location: [facility];  Service: Orthopedics;  Laterality: Left;  requests 60mins  . TOTAL KNEE REVISION Right 06/25/2017   Procedure: RIGHT TOTAL KNEE REVISION;  Surgeon: Gaynelle Arabian, MD;  Location: [facility];  Service: Orthopedics;  Laterality: Right;  . UPPER GI ENDOSCOPY      There were no vitals filed for this visit.  Subjective Assessment - 07/31/17 1404    Subjective  Patient reports she was seen by MD and is now able to use turmeric for anti inflammatory and is going to [place] for Christmas. She reports feeling stiff and having mild discomfort today.     Patient is accompained by:  -- husband    Pertinent History  right TKR revision 06/25/2017; left TKR 11/04/2016; arthritis; patient enjoys outdoor activities, hiking    Limitations  Sitting;Standing;Walking;House hold  activities    Patient Stated Goals  to walk and move without difficulty and not using AD    Currently in Pain?  Yes    Pain Score  3     Pain Location  Knee    Pain Orientation  Right    Pain Descriptors / Indicators  Aching;Sore;Tightness    Pain Type  Surgical pain 06/25/2017    Pain Onset  More than a month ago    Pain Frequency  Intermittent         Objective: AAROM: right knee flexion pre treatment85degrees/ post treatment up to 92 degrees Palpation: increased quadriceps spasms along entire thigh centrally as compared to previous session  Treatment:  Manual Therapy:18 min. : goal: improve soft tissue elasticity, knee ROM STM performed to right LE quadriceps/hamstring  muscles with patient sitting; superficial techniques    Therapeutic Exercise:patient performed with instruction, verbal and tactile cues of therapist: goal: improve LEFS, gait, independent with home program Seated  MAI knee flexion right LE 3 sets, hold 5 seconds each  Leg press bilateral 25# and 35# 10 reps each with facilitation to engage right LE musculature and tactile cues demonstrating to patient with good results and increased quadriceps activation Seated end range flexion with demonstration of using theraband for same Seated leg press with blue + green theraband x 10 reps with repeated demonstration and return demonstration for correct technique and instructed husband in same  Modalities: Electrical stimulation:18 min.Russian stim. 10/10 cycle applied (2) electrodes to quadriceps/VMORightLE with patient reclined with LE supported on pillow; pt. Performing quad sets with each cycle; goal muscle re education High volt estim.clincial program for muscle spasms (2) electrodes applied to lateral aspect right knee and hamstringintensity to tolerance with patient in reclined position with LE supported on pillow goal: pain, spasms Ice pack applied to right knee during estim: goal : pain; no adverse reactions noted  Nustep at end of session: independent (unbilled time) level #6 intensity x 15 min.  Patient response to treatment:Patient with improved quad control and hamstring strength following multiple repetitions, facilitation. Improved gait pattern with patient reporting improved strength in right LE following exercises/treatment. Improved understanding of home exercises and how to facilitate quad activation with repeated demonstration and performing of exercises.   PT Education - 07/31/17 1436    Education provided  Yes    Education Details  Exercise instruction, work on end range flexion strengthening    Person(s) Educated  Patient    Methods  Explanation;Demonstration;Verbal  cues    Comprehension  Verbal cues required;Returned demonstration;Verbalized understanding          PT Long Term Goals - 07/29/17 1338      PT LONG TERM GOAL #1   Title  Patient will demonstrate improvement with daily tasks with decreased right knee pain as indicated by LEFS score of 30/80     Baseline  LEFS 15/80: 07/29/17 30/80    Status  Achieved      PT LONG TERM GOAL #2   Title  Patient will demonstrate improvement with daily tasks with decreased right knee pain as indicated by LEFS score of 45/80    Baseline  LEFS 15/80     Status  On-going    Target Date  08/11/17      PT LONG TERM GOAL #3   Title  Patient will demonstrate improved functional community ambulation and decreased fall risk without AD and 10MW time of 10 seconds or less     Status  On-going    Target Date  08/11/17      PT LONG TERM GOAL #4   Title  Patient will be independent with home program for pain control and progressive exercise for self managment once discharged from physical therapy    Baseline  limited knoweldge and requires moderate cuing for pain control, exercises and progression    Status  On-going    Target Date  08/11/17      PT LONG TERM GOAL #5   Title  Patient will have AROM right knee flexion to at least 110 or greater to allow improved mobility with transfers and stairs with minimal to no difficulty    Baseline  AROM right knee flexion 0-75 degrees with pain; 07/21/17 0-70 degrees; 07/29/17 0-85 degrees    Status  Revised    Target Date  08/11/17            Plan - 07/31/17 1437    Clinical Impression Statement  Patient demonstrated improved AAROM right knee flexion to 92 degrees following STM, exercises and imrpoved ability to walk following leg press for quad activation. She has improved understanding of working of activating quadriceps with excercises and should continue to improve with additional physical therapy intervention.     Rehab Potential  Good    Clinical Impairments  Affecting Rehab Potential  (+)acute condition, family support, motivated, previous TKR(-) arthritis, COPD, PVD    PT Frequency  3x / week    PT Duration  6 weeks    PT Treatment/Interventions  Gait training;Neuromuscular re-education;Manual techniques;Patient/family education;Therapeutic exercise;Electrical Stimulation;Cryotherapy    PT Next Visit Plan  pain control, ROM exercise, strength exercises; electrical stimualtion for pain control, re education    PT Home Exercise Plan  elevation, use of ice, ROM for knee extension, flexion       Patient will benefit from skilled therapeutic intervention in order to improve the following deficits and impairments:  Pain, Increased muscle spasms, Decreased strength, Decreased range of motion, Decreased endurance, Decreased activity tolerance, Difficulty walking, Impaired perceived functional ability  Visit Diagnosis: Stiffness of right knee, not elsewhere classified  Difficulty in walking, not elsewhere classified  Muscle weakness (generalized)     Problem List Patient Active Problem List   Diagnosis Date Noted  . Easy bruising 07/21/2017  . 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 OA (osteoarthritis) of knee 11/04/2016  . COPD (chronic obstructive pulmonary disease) ([place]) 12/04/2015  . Ovarian mass, left 10/04/2015  . Osteoporosis 09/05/2015  . Skin lesion 08/01/2015  . Chronic infection of sinus 06/30/2015  . H/O varicella 06/30/2015  . Lung nodule, solitary 06/30/2015  . Allergic rhinitis 05/26/2015  . Bilateral cataracts 05/26/2015  . 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 Hypercholesteremia 05/26/2015  . Hypothyroidism 05/26/2015  . Cramps of lower extremity 05/26/2015  . Hemorrhoids, internal 05/26/2015  . Cervical pain 05/26/2015  . 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 Acquired trigger finger 05/26/2015  . Diverticulitis 05/26/2015    Jomarie Longs PT 08/01/2017, 12:39 PM  [facility] [address], [place], [address] Phone: [phone]   Fax:  [phone]  Name: RAINE BLODGETT MRN: [id number] Date of Birth: Mar 07, 1941

## 2017-08-05 ENCOUNTER — Ambulatory Visit: Payer: PPO | Admitting: Physical Therapy

## 2017-08-06 ENCOUNTER — Encounter: Payer: Self-pay | Admitting: Physical Therapy

## 2017-08-06 ENCOUNTER — Ambulatory Visit: Payer: PPO | Admitting: Physical Therapy

## 2017-08-06 DIAGNOSIS — M6281 Muscle weakness (generalized): Secondary | ICD-10-CM

## 2017-08-06 DIAGNOSIS — M25661 Stiffness of right knee, not elsewhere classified: Secondary | ICD-10-CM

## 2017-08-06 DIAGNOSIS — R262 Difficulty in walking, not elsewhere classified: Secondary | ICD-10-CM

## 2017-08-06 NOTE — Therapy (Signed)
[facility] [address], [place], [address] Phone: [phone]   Fax:  [phone]  Physical Therapy Treatment  Patient Details  Name: [name] [name] MRN: [id number] Date of Birth: 10-20-1940 Referring Provider: Gaynelle Arabian MD   Encounter Date: 08/06/2017  PT End of Session - 08/06/17 1452    Visit Number  16    Number of Visits  18    Date for PT Re-Evaluation  08/11/17    Authorization Type  16    Authorization Time Period  20(G code)    PT Start Time  1347    PT Stop Time  1500    PT Time Calculation (min)  73 min    Activity Tolerance  Patient tolerated treatment well    Behavior During Therapy  [facility] for tasks assessed/performed       Past Medical History:  Diagnosis Date  . Allergy   . Allergy    allergy to spandex "had a bad reaction of blisters when using after a surgery"  . 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 Bruises easily   . Complication of anesthesia    reports history of "trouble waking up after surgery", mother developed fevers questionable malignant hyperthermia   . COPD (chronic obstructive pulmonary disease) ([place])   . Dysrhythmia    frequent PVC's  . Family history of adverse reaction to anesthesia    mother with history of malignant hypertension  . GERD (gastroesophageal reflux disease)   . Heart palpitations   . Hyperlipidemia   . Hypothyroidism   . Peripheral vascular disease ([place]) 1980   pulmonary embolism  . Pneumonia   . Pulmonary embolism ([place])   . PVC (premature ventricular contraction)   . Shortness of breath dyspnea    with extertion  . Thyroid disease     Past Surgical History:  Procedure Laterality Date  . abdominal surgery    . APPENDECTOMY    . BLEPHAROPLASTY Bilateral 11/2014   left eyelid 08/03/2015 Dr. Vickki Muff  . BREAST CYST ASPIRATION Left   . CHOLECYSTECTOMY  03/2004  . COLONOSCOPY    . [place]   fertility testing  . EYE SURGERY Bilateral 2015   cataract excision  . KNEE SURGERY Right    knee arthroscopy-2000; Total knee replacement-2002   . LAPAROTOMY  1969   fertility testing  . patelectomy Right 1972  . Removal of medical meniscus  1972  . TOTAL KNEE ARTHROPLASTY Left 11/04/2016   Procedure: LEFT TOTAL KNEE ARTHROPLASTY;  Surgeon: Gaynelle Arabian, MD;  Location: [facility];  Service: Orthopedics;  Laterality: Left;  requests 16mins  . TOTAL KNEE REVISION Right 06/25/2017   Procedure: RIGHT TOTAL KNEE REVISION;  Surgeon: Gaynelle Arabian, MD;  Location: [facility];  Service: Orthopedics;  Laterality: Right;  . UPPER GI ENDOSCOPY      There were no vitals filed for this visit.  Subjective Assessment - 08/06/17 1348    Subjective  Patient reports she is seeing significant improvement with less pain and improving ROM and has been using a cane for the past 5 days.    Patient is accompained by:  -- husband    Pertinent History  right TKR revision 06/25/2017; left TKR 11/04/2016; arthritis; patient enjoys outdoor activities, hiking    Limitations  Sitting;Standing;Walking;House hold activities    Patient Stated Goals  to walk and move without difficulty and not  using AD    Currently in Pain?  Other (Comment) very mild discomfort right knee       Objective: AAROM: right knee flexion pre treatment90degrees/ post treatment up to 95degrees Palpation: mild spasms along right LE quadriceps, decreased scar mobility along incision distal>proximal  Treatment: Manual Therapy:10 min. : goal: improve soft tissue elasticity, knee ROM STM performed to right LE quadriceps/hamstring muscles with patient sitting; superficial techniques    Therapeutic Exercise:patient performed with instruction, verbal and tactile cues of therapist: goal: improve LEFS, gait, independent with home program Seated  MAI knee flexion right LE 3 sets, hold 5 seconds each  Seated knee extension with  facilitation in full knee extension hold 5 seconds and lower controlled motion 5 reps Leg press bilateral 25# and 35# 10 reps each with facilitation to engage right LE musculature and tactile cues demonstrating to patient with good results and increased quadriceps activation Step ups forward leading with each LE x 10 reps Standing hip abduction and hip extension with toe tapping 3 x 3 reps each LE for stabilization/ balance Seated leg press with blue + green theraband with repeated demonstration   Modalities: Electrical stimulation:18 min.Russian stim. 10/10 cycle applied (2) electrodes to quadriceps/VMORightLE with patient reclined with LE supported on pillow; pt. Performing quad sets with each cycle; goal muscle re education High volt estim.clincial program for muscle spasms (2) electrodes applied to lateral aspect right knee and hamstringintensity to tolerance with patient in reclined position with LE supported on pillow goal: pain, spasms Ice pack applied to right knee during estim: goal : pain; no adverse reactions noted  Nustep atendof session: independent (unbilled time) level #6 intensityx 15 min.  Patient response to treatment: Patient able to perform exercises with improved motor control following guided exercises with facilitation and isometric holding knee extension.     PT Education - 08/06/17 1456    Education provided  Yes    Education Details  exercise instruction for standing hip abduction/extension and step ups, correct technique, alignment    Person(s) Educated  Patient    Methods  Explanation;Demonstration;Verbal cues    Comprehension  Verbal cues required;Returned demonstration;Verbalized understanding          PT Long Term Goals - 07/29/17 1338      PT LONG TERM GOAL #1   Title  Patient will demonstrate improvement with daily tasks with decreased right knee pain as indicated by LEFS score of 30/80     Baseline  LEFS 15/80: 07/29/17 30/80    Status   Achieved      PT LONG TERM GOAL #2   Title  Patient will demonstrate improvement with daily tasks with decreased right knee pain as indicated by LEFS score of 45/80    Baseline  LEFS 15/80     Status  On-going    Target Date  08/11/17      PT LONG TERM GOAL #3   Title  Patient will demonstrate improved functional community ambulation and decreased fall risk without AD and 10MW time of 10 seconds or less     Status  On-going    Target Date  08/11/17      PT LONG TERM GOAL #4   Title  Patient will be independent with home program for pain control and progressive exercise for self managment once discharged from physical therapy    Baseline  limited knoweldge and requires moderate cuing for pain control, exercises and progression    Status  On-going    Target  Date  08/11/17      PT LONG TERM GOAL #5   Title  Patient will have AROM right knee flexion to at least 110 or greater to allow improved mobility with transfers and stairs with minimal to no difficulty    Baseline  AROM right knee flexion 0-75 degrees with pain; 07/21/17 0-70 degrees; 07/29/17 0-85 degrees    Status  Revised    Target Date  08/11/17            Plan - 08/06/17 1453    Clinical Impression Statement  Patient demonstrated imporved AAROM to 95 degrees flexion right knee and imporved motor control with knee extension following repetition with isometric hold/lowering exercises. She continues with limitations of ROM and strength and will benefit from additional physical therapy intervention to achieve maximal functional return.     Rehab Potential  Good    Clinical Impairments Affecting Rehab Potential  (+)acute condition, family support, motivated, previous TKR(-) arthritis, COPD, PVD    PT Frequency  3x / week    PT Duration  6 weeks    PT Treatment/Interventions  Gait training;Neuromuscular re-education;Manual techniques;Patient/family education;Therapeutic exercise;Electrical Stimulation;Cryotherapy    PT Next  Visit Plan  pain control, ROM exercise, strength exercises; electrical stimualtion for pain control, re education    PT Home Exercise Plan  elevation, use of ice, ROM for knee extension, flexion       Patient will benefit from skilled therapeutic intervention in order to improve the following deficits and impairments:  Pain, Increased muscle spasms, Decreased strength, Decreased range of motion, Decreased endurance, Decreased activity tolerance, Difficulty walking, Impaired perceived functional ability  Visit Diagnosis: Stiffness of right knee, not elsewhere classified  Difficulty in walking, not elsewhere classified  Muscle weakness (generalized)     Problem List Patient Active Problem List   Diagnosis Date Noted  . Easy bruising 07/21/2017  . 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 OA (osteoarthritis) of knee 11/04/2016  . COPD (chronic obstructive pulmonary disease) ([place]) 12/04/2015  . Ovarian mass, left 10/04/2015  . Osteoporosis 09/05/2015  . Skin lesion 08/01/2015  . Chronic infection of sinus 06/30/2015  . H/O varicella 06/30/2015  . Lung nodule, solitary 06/30/2015  . Allergic rhinitis 05/26/2015  . Bilateral cataracts 05/26/2015  . 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 Hypercholesteremia 05/26/2015  . Hypothyroidism 05/26/2015  . Cramps of lower extremity 05/26/2015  . Hemorrhoids, internal 05/26/2015  . Cervical pain 05/26/2015  . 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 Acquired trigger finger 05/26/2015  . Diverticulitis 05/26/2015    Jomarie Longs PT 08/06/2017, 6:33 PM  [place] [address] [place], [address] Phone: [phone]   Fax:  [phone]  Name: [name] [name] MRN: [id number] Date of Birth: [birth year]

## 2017-08-07 ENCOUNTER — Encounter: Payer: Self-pay | Admitting: Physical Therapy

## 2017-08-07 ENCOUNTER — Ambulatory Visit: Payer: PPO | Admitting: Physical Therapy

## 2017-08-07 DIAGNOSIS — M25661 Stiffness of right knee, not elsewhere classified: Secondary | ICD-10-CM

## 2017-08-07 DIAGNOSIS — M6281 Muscle weakness (generalized): Secondary | ICD-10-CM

## 2017-08-07 DIAGNOSIS — R262 Difficulty in walking, not elsewhere classified: Secondary | ICD-10-CM

## 2017-08-07 NOTE — Therapy (Signed)
[facility] [facility] [address] [place], [address] Phone: [phone]   Fax:  [phone]  Physical Therapy Treatment  Patient Details  Name: [name] [name] MRN: [id number] Date of Birth: 01-28-1941 Referring Provider: Gaynelle Arabian MD   Encounter Date: 08/07/2017  PT End of Session - 08/07/17 1821    Visit Number  17    Number of Visits  18    Date for PT Re-Evaluation  08/11/17    Authorization Type  17    Authorization Time Period  20(G code)    PT Start Time  1510    PT Stop Time  1610    PT Time Calculation (min)  60 min    Activity Tolerance  Patient tolerated treatment well    Behavior During Therapy  [facility] for tasks assessed/performed       Past Medical History:  Diagnosis Date  . Allergy   . Allergy    allergy to spandex "had a bad reaction of blisters when using after a surgery"  . 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 Bruises easily   . Complication of anesthesia    reports history of "trouble waking up after surgery", mother developed fevers questionable malignant hyperthermia   . COPD (chronic obstructive pulmonary disease) ([place])   . Dysrhythmia    frequent PVC's  . Family history of adverse reaction to anesthesia    mother with history of malignant hypertension  . GERD (gastroesophageal reflux disease)   . Heart palpitations   . Hyperlipidemia   . Hypothyroidism   . Peripheral vascular disease ([place]) 1980   pulmonary embolism  . Pneumonia   . Pulmonary embolism ([place])   . PVC (premature ventricular contraction)   . Shortness of breath dyspnea    with extertion  . Thyroid disease     Past Surgical History:  Procedure Laterality Date  . abdominal surgery    . APPENDECTOMY    . BLEPHAROPLASTY Bilateral 11/2014   left eyelid 08/03/2015 Dr. Vickki Muff  . BREAST CYST ASPIRATION Left   . CHOLECYSTECTOMY  03/2004  . COLONOSCOPY    . [place]   fertility testing  . EYE SURGERY Bilateral 2015   cataract excision  . KNEE SURGERY Right    knee arthroscopy-2000; Total knee replacement-2002   . LAPAROTOMY  1969   fertility testing  . patelectomy Right 1972  . Removal of medical meniscus  1972  . TOTAL KNEE ARTHROPLASTY Left 11/04/2016   Procedure: LEFT TOTAL KNEE ARTHROPLASTY;  Surgeon: Gaynelle Arabian, MD;  [facility]: [facility];  Service: Orthopedics;  Laterality: Left;  requests 17mins  . TOTAL KNEE REVISION Right 06/25/2017   Procedure: RIGHT TOTAL KNEE REVISION;  Surgeon: Gaynelle Arabian, MD;  [facility];  Service: Orthopedics;  Laterality: Right;  . UPPER GI ENDOSCOPY      There were no vitals filed for this visit.  Subjective Assessment - 08/07/17 1512    Subjective  Patient reports she is sore today in right knee and has been exercising quite a bit    Patient is accompained by:  -- husband    Pertinent History  right TKR revision 06/25/2017; left TKR 11/04/2016; arthritis; patient enjoys outdoor activities, hiking    Limitations  Sitting;Standing;Walking;House hold activities    Patient Stated Goals  to walk and move without difficulty and not using AD    Currently in Pain?  Other (Comment) soreness right knee         Objective: AAROM: right knee flexion pre treatment95degrees/ post treatment up to 100degrees Palpation: mild spasms along right LE quadriceps, decreased scar mobility along incision distal>proximal  Treatment: Manual Therapy:15 min. : goal: improve soft tissue elasticity, knee ROM STM performed to right LE quadriceps/hamstring muscles with patient sitting; superficial techniques and myofascial release techniques    Therapeutic Exercise:patient performed with instruction, verbal and tactile cues of therapist: goal: improve LEFS, gait, independent with home program Seated  MAI knee flexion right LE 3 sets, hold 5 seconds each Seated knee extension with facilitation in full knee  extension hold 5 seconds and lower controlled motion 5 reps Leg press bilateral 25# and 35# 15 reps each with facilitation to engage right LE musculature and tactile cues demonstrating to patient with good results and increased quadriceps activation  Modalities: Electrical stimulation:27min.Russian stim. 10/10 cycle applied (2) electrodes to quadriceps/VMORightLE with patient reclined with LE supported on pillow; pt. Performing quad sets with each cycle; goal muscle re education High volt estim.clincial program for muscle spasms (2) electrodes applied to lateral aspect right knee and hamstringintensity to tolerance with patient in reclined position with LE supported on pillow goal: pain, spasms Ice pack applied to right knee during estim: goal : pain; no adverse reactions noted  Nustep atendof session: independent (unbilled time) level #4intensityx 49min.  Patient response to treatment: Patient with improved ROM by 5 degrees following extensive manual therapy techniques. Improved quad control following exercises with facilitation.                PT Education - 08/07/17 1821    Education provided  Yes    Education Details  exercise instruction for technique    Person(s) Educated  Patient    Methods  Explanation;Demonstration;Verbal cues    Comprehension  Verbalized understanding;Verbal cues required;Returned demonstration          PT Long Term Goals - 07/29/17 1338      PT LONG TERM GOAL #1   Title  Patient will demonstrate improvement with daily tasks with decreased right knee pain as indicated by LEFS score of 30/80     Baseline  LEFS 15/80: 07/29/17 30/80    Status  Achieved      PT LONG TERM GOAL #2   Title  Patient will demonstrate improvement with daily tasks with decreased right knee pain as indicated by LEFS score of 45/80    Baseline  LEFS 15/80     Status  On-going    Target Date  08/11/17      PT LONG TERM GOAL #3   Title  Patient will  demonstrate improved functional community ambulation and decreased fall risk without AD and 10MW time of 10 seconds or less     Status  On-going    Target Date  08/11/17      PT LONG TERM GOAL #4   Title  Patient will be independent with home program for pain control and progressive exercise for self managment once discharged from physical therapy    Baseline  limited knoweldge and requires moderate cuing for pain control, exercises and progression    Status  On-going    Target Date  08/11/17      PT LONG TERM GOAL #5   Title  Patient will have AROM right knee flexion to at least 110 or greater to allow improved mobility with transfers and stairs with minimal to no difficulty  Baseline  AROM right knee flexion 0-75 degrees with pain; 07/21/17 0-70 degrees; 07/29/17 0-85 degrees    Status  Revised    Target Date  08/11/17            Plan - 08/07/17 1822    Clinical Impression Statement  Patient demonstrates steady improvement with ROM up to 100 degrees following STM and exercise. She is progressing with goals and strength with quadriceps and should continue to improve with additional physical therapy intervention.     Rehab Potential  Good    Clinical Impairments Affecting Rehab Potential  (+)acute condition, family support, motivated, previous TKR(-) arthritis, COPD, PVD    PT Frequency  3x / week    PT Duration  6 weeks    PT Treatment/Interventions  Gait training;Neuromuscular re-education;Manual techniques;Patient/family education;Therapeutic exercise;Electrical Stimulation;Cryotherapy    PT Next Visit Plan  pain control, ROM exercise, strength exercises; electrical stimualtion for pain control, re education    PT Home Exercise Plan  elevation, use of ice, ROM for knee extension, flexion       Patient will benefit from skilled therapeutic intervention in order to improve the following deficits and impairments:  Pain, Increased muscle spasms, Decreased strength, Decreased range  of motion, Decreased endurance, Decreased activity tolerance, Difficulty walking, Impaired perceived functional ability  Visit Diagnosis: Stiffness of right knee, not elsewhere classified  Difficulty in walking, not elsewhere classified  Muscle weakness (generalized)     Problem List Patient Active Problem List   Diagnosis Date Noted  . Easy bruising 07/21/2017  . 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 OA (osteoarthritis) of knee 11/04/2016  . COPD (chronic obstructive pulmonary disease) ([place]) 12/04/2015  . Ovarian mass, left 10/04/2015  . Osteoporosis 09/05/2015  . Skin lesion 08/01/2015  . Chronic infection of sinus 06/30/2015  . H/O varicella 06/30/2015  . Lung nodule, solitary 06/30/2015  . Allergic rhinitis 05/26/2015  . Bilateral cataracts 05/26/2015  . 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 Hypercholesteremia 05/26/2015  . Hypothyroidism 05/26/2015  . Cramps of lower extremity 05/26/2015  . Hemorrhoids, internal 05/26/2015  . Cervical pain 05/26/2015  . 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 Acquired trigger finger 05/26/2015  . Diverticulitis 05/26/2015    Jomarie Longs PT 08/07/2017, 6:25 PM  [facility] [facility] [address] [place], [address] Phone: [phone]   Fax:  [phone]  Name: [name] [name] MRN: [id number] Date of Birth: 05-03-1941

## 2017-08-11 ENCOUNTER — Ambulatory Visit: Payer: PPO | Admitting: Physical Therapy

## 2017-08-11 ENCOUNTER — Encounter: Payer: Self-pay | Admitting: Physical Therapy

## 2017-08-11 DIAGNOSIS — M25661 Stiffness of right knee, not elsewhere classified: Secondary | ICD-10-CM

## 2017-08-11 DIAGNOSIS — M6281 Muscle weakness (generalized): Secondary | ICD-10-CM

## 2017-08-11 DIAGNOSIS — R262 Difficulty in walking, not elsewhere classified: Secondary | ICD-10-CM

## 2017-08-11 NOTE — Therapy (Signed)
[facility] [address] [place], [address] Phone: [phone]   Fax:  [phone]  Physical Therapy Treatment  Patient Details  Name: [name] [name] MRN: [id number] Date of Birth: April 30, 1941 Referring Provider: Gaynelle Arabian MD   Encounter Date: 08/11/2017  PT End of Session - 08/11/17 1600    Visit Number  18    Number of Visits  18    Date for PT Re-Evaluation  08/11/17    Authorization Type  18    Authorization Time Period  20(G code)    PT Start Time  7253    PT Stop Time  1555    PT Time Calculation (min)  78 min    Activity Tolerance  Patient tolerated treatment well    Behavior During Therapy  [facility] for tasks assessed/performed       Past Medical History:  Diagnosis Date  . Allergy   . Allergy    allergy to spandex "had a bad reaction of blisters when using after a surgery"  . 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 Bruises easily   . Complication of anesthesia    reports history of "trouble waking up after surgery", mother developed fevers questionable malignant hyperthermia   . COPD (chronic obstructive pulmonary disease) ([place])   . Dysrhythmia    frequent PVC's  . Family history of adverse reaction to anesthesia    mother with history of malignant hypertension  . GERD (gastroesophageal reflux disease)   . Heart palpitations   . Hyperlipidemia   . Hypothyroidism   . Peripheral vascular disease ([place]) 1980   pulmonary embolism  . Pneumonia   . Pulmonary embolism ([place])   . PVC (premature ventricular contraction)   . Shortness of breath dyspnea    with extertion  . Thyroid disease     Past Surgical History:  Procedure Laterality Date  . abdominal surgery    . APPENDECTOMY    . BLEPHAROPLASTY Bilateral 11/2014   left eyelid 08/03/2015 Dr. Vickki Muff  . BREAST CYST ASPIRATION Left   . CHOLECYSTECTOMY  03/2004  . COLONOSCOPY    . [place]   fertility testing  . EYE SURGERY Bilateral 2015   cataract excision  . KNEE SURGERY Right    knee arthroscopy-2000; Total knee replacement-2002   . LAPAROTOMY  1969   fertility testing  . patelectomy Right 1972  . Removal of medical meniscus  1972  . TOTAL KNEE ARTHROPLASTY Left 11/04/2016   Procedure: LEFT TOTAL KNEE ARTHROPLASTY;  Surgeon: Gaynelle Arabian, MD;  Location: [facility];  Service: Orthopedics;  Laterality: Left;  requests 60mins  . TOTAL KNEE REVISION Right 06/25/2017   Procedure: RIGHT TOTAL KNEE REVISION;  Surgeon: Gaynelle Arabian, MD;  Location: [facility];  Service: Orthopedics;  Laterality: Right;  . UPPER GI ENDOSCOPY      There were no vitals filed for this visit.  Subjective Assessment - 08/11/17 1440    Subjective  Patient is stiff in right knee and patella tendon is sore due to trying to work on extension. She is traveling for the holidays and will be working with daughter in [place] on exercises as her daughter is a PT as well.     Patient is accompained by:  -- husband    Pertinent History  right TKR revision 06/25/2017; left TKR 11/04/2016; arthritis; patient enjoys outdoor activities, hiking    Limitations  Sitting;Standing;Walking;House hold activities    Patient Stated Goals  to walk and move without difficulty and not using AD    Currently in Pain?  Other (Comment) stiffness left knee        Objective: AAROM: right knee flexion pre treatment97 degrees/ post treatment up to 100degrees Palpation:mild spasms along right LE quadriceps, decreased scar mobility along incision distal>proximal Gait: ambulating with SPC with improving hip and knee flexion and push off right LE   Treatment: Manual Therapy:75min. : goal: improve soft tissue elasticity, knee ROM STM performed to right LE quadriceps/hamstring muscles with patient sitting; superficial techniques and myofascial release techniques   Therapeutic Exercise:patient performed with instruction,  verbal and tactile cues of therapist: goal: improve LEFS, gait, independent with home program Seated  MAI knee flexion right LE 3 sets, hold 5 seconds each Seated knee extension with facilitation in full knee extension hold 5 seconds and lower controlled motion 5 reps Leg press bilateral 35# x 15 reps and 45# 8 reps  with facilitation to engage right LE musculature and tactile cues demonstrating to patient with good results and increased quadriceps activation  Modalities: Electrical stimulation:14min.Russian stim. 10/10 cycle applied (2) electrodes to quadriceps/VMORightLE with patient reclined with LE supported on pillow; pt. Performing quad sets with each cycle; goal muscle re education High volt estim.clincial program for muscle spasms (2) electrodes applied to medial and lateral aspect right knee, intensity to tolerance with patient in reclined position with LE supported on pillow goal: pain, spasms Ice pack applied to right knee during estim: goal: pain; no adverse reactions noted  Nustep atendof session: independent (unbilled time) level #4intensityx 76min.  Patient response to treatment:Patient demonstrated improved ROM and flexibility right knee following STM and exercises. She demonstrated increased fatigue with 45# on leg press.       PT Education - 08/11/17 1500    Education provided  Yes    Education Details  exercise insruction for technique; re assessed HEP     Person(s) Educated  Patient    Methods  Explanation;Demonstration;Verbal cues    Comprehension  Verbalized understanding;Returned demonstration          PT Long Term Goals - 07/29/17 1338      PT LONG TERM GOAL #1   Title  Patient will demonstrate improvement with daily tasks with decreased right knee pain as indicated by LEFS score of 30/80     Baseline  LEFS 15/80: 07/29/17 30/80    Status  Achieved      PT LONG TERM GOAL #2   Title  Patient will demonstrate improvement with daily tasks  with decreased right knee pain as indicated by LEFS score of 45/80    Baseline  LEFS 15/80     Status  On-going    Target Date  08/11/17      PT LONG TERM GOAL #3   Title  Patient will demonstrate improved functional community ambulation and decreased fall risk without AD and 10MW time of 10 seconds or less     Status  On-going    Target Date  08/11/17      PT LONG TERM GOAL #4   Title  Patient will be independent with home program for pain control and progressive exercise for self managment once discharged from physical therapy    Baseline  limited knoweldge and requires moderate cuing for pain control, exercises and progression    Status  On-going    Target Date  08/11/17      PT  LONG TERM GOAL #5   Title  Patient will have AROM right knee flexion to at least 110 or greater to allow improved mobility with transfers and stairs with minimal to no difficulty    Baseline  AROM right knee flexion 0-75 degrees with pain; 07/21/17 0-70 degrees; 07/29/17 0-85 degrees    Status  Revised    Target Date  08/11/17            Plan - 08/11/17 1601    Clinical Impression Statement  Patient demonstrated improved ROM right knee flexion to 100 degrees following treatment. She is improving quad control and strength with current physical therapy intervention and HEP. She will be traveling for the holidays and will be re assessed on returning next week.     Rehab Potential  Good    Clinical Impairments Affecting Rehab Potential  (+)acute condition, family support, motivated, previous TKR(-) arthritis, COPD, PVD    PT Frequency  3x / week    PT Duration  6 weeks    PT Treatment/Interventions  Gait training;Neuromuscular re-education;Manual techniques;Patient/family education;Therapeutic exercise;Electrical Stimulation;Cryotherapy    PT Next Visit Plan  pain control, ROM exercise, strength exercises; electrical stimualtion for pain control, re education    PT Home Exercise Plan  elevation, use of ice,  ROM for knee extension, flexion       Patient will benefit from skilled therapeutic intervention in order to improve the following deficits and impairments:  Pain, Increased muscle spasms, Decreased strength, Decreased range of motion, Decreased endurance, Decreased activity tolerance, Difficulty walking, Impaired perceived functional ability  Visit Diagnosis: Stiffness of right knee, not elsewhere classified  Difficulty in walking, not elsewhere classified  Muscle weakness (generalized)     Problem List Patient Active Problem List   Diagnosis Date Noted  . Easy bruising 07/21/2017  . 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 OA (osteoarthritis) of knee 11/04/2016  . COPD (chronic obstructive pulmonary disease) ([place]) 12/04/2015  . Ovarian mass, left 10/04/2015  . Osteoporosis 09/05/2015  . Skin lesion 08/01/2015  . Chronic infection of sinus 06/30/2015  . H/O varicella 06/30/2015  . Lung nodule, solitary 06/30/2015  . Allergic rhinitis 05/26/2015  . Bilateral cataracts 05/26/2015  . 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 Hypercholesteremia 05/26/2015  . Hypothyroidism 05/26/2015  . Cramps of lower extremity 05/26/2015  . Hemorrhoids, internal 05/26/2015  . Cervical pain 05/26/2015  . 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 Acquired trigger finger 05/26/2015  . Diverticulitis 05/26/2015    Jomarie Longs PT 08/12/2017, 1:43 PM  [facility] [address], [place], [address] Phone: [phone]   Fax:  [phone]  Name: DOROTHYMAE MACIVER MRN: [id number] Date of Birth: 1941-03-05

## 2017-08-14 ENCOUNTER — Encounter: Payer: PPO | Admitting: Physical Therapy

## 2017-08-20 ENCOUNTER — Encounter: Payer: PPO | Admitting: Physical Therapy

## 2017-08-21 ENCOUNTER — Encounter: Payer: PPO | Admitting: Physical Therapy

## 2017-08-22 ENCOUNTER — Telehealth: Payer: Self-pay

## 2017-08-22 ENCOUNTER — Other Ambulatory Visit: Payer: Self-pay

## 2017-08-22 DIAGNOSIS — R911 Solitary pulmonary nodule: Secondary | ICD-10-CM

## 2017-08-22 NOTE — Telephone Encounter (Signed)
I sent a letter reminder to patient for her 6 months follow up to have her CT Scan on 09/18/2017 and then follow up with Dr. Genevive Bi on 09/19/2017 to go over results. I also tried calling the patient and was not able to leave a message.

## 2017-08-25 ENCOUNTER — Ambulatory Visit: Payer: PPO | Admitting: Physical Therapy

## 2017-08-25 ENCOUNTER — Encounter: Payer: Self-pay | Admitting: Physical Therapy

## 2017-08-25 DIAGNOSIS — M6281 Muscle weakness (generalized): Secondary | ICD-10-CM

## 2017-08-25 DIAGNOSIS — M25661 Stiffness of right knee, not elsewhere classified: Secondary | ICD-10-CM

## 2017-08-25 DIAGNOSIS — R262 Difficulty in walking, not elsewhere classified: Secondary | ICD-10-CM

## 2017-08-25 NOTE — Therapy (Signed)
[facility] [address], [place], [address] Phone: [phone]   Fax:  [phone]  Physical Therapy Treatment  Patient Details  Name: [name] [name] MRN: [id number] Date of Birth: Jan 12, 1941 Referring Provider: Gaynelle Arabian MD   Encounter Date: 08/25/2017  PT End of Session - 08/25/17 1438    Visit Number  19    Number of Visits  24    Date for PT Re-Evaluation  09/08/17    Authorization Type  19    Authorization Time Period  20(G code)    PT Start Time  1347    PT Stop Time  1445    PT Time Calculation (min)  58 min    Activity Tolerance  Patient tolerated treatment well    Behavior During Therapy  [facility] for tasks assessed/performed       Past Medical History:  Diagnosis Date  . Allergy   . Allergy    allergy to spandex "had a bad reaction of blisters when using after a surgery"  . 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 Bruises easily   . Complication of anesthesia    reports history of "trouble waking up after surgery", mother developed fevers questionable malignant hyperthermia   . COPD (chronic obstructive pulmonary disease) ([place])   . Dysrhythmia    frequent PVC's  . Family history of adverse reaction to anesthesia    mother with history of malignant hypertension  . GERD (gastroesophageal reflux disease)   . Heart palpitations   . Hyperlipidemia   . Hypothyroidism   . Peripheral vascular disease ([place]) 1980   pulmonary embolism  . Pneumonia   . Pulmonary embolism ([place])   . PVC (premature ventricular contraction)   . Shortness of breath dyspnea    with extertion  . Thyroid disease     Past Surgical History:  Procedure Laterality Date  . abdominal surgery    . APPENDECTOMY    . BLEPHAROPLASTY Bilateral 11/2014   left eyelid 08/03/2015 Dr. Vickki Muff  . BREAST CYST ASPIRATION Left   . CHOLECYSTECTOMY  03/2004  . COLONOSCOPY    . [place]   fertility testing  . EYE SURGERY Bilateral 2015   cataract excision  . KNEE SURGERY Right    knee arthroscopy-2000; Total knee replacement-2002   . LAPAROTOMY  1969   fertility testing  . patelectomy Right 1972  . Removal of medical meniscus  1972  . TOTAL KNEE ARTHROPLASTY Left 11/04/2016   Procedure: LEFT TOTAL KNEE ARTHROPLASTY;  Surgeon: Gaynelle Arabian, MD;  Location: [facility];  Service: Orthopedics;  Laterality: Left;  requests 45mins  . TOTAL KNEE REVISION Right 06/25/2017   Procedure: RIGHT TOTAL KNEE REVISION;  Surgeon: Gaynelle Arabian, MD;  Location: [facility];  Service: Orthopedics;  Laterality: Right;  . UPPER GI ENDOSCOPY      There were no vitals filed for this visit.  Subjective Assessment - 08/25/17 1352    Subjective  Patient reports she is having less pain in her right knee and is strengthening quadriceps at home with good results. She has tape on medial aspect of right knee that she reports was placed there by daughter who is a physical therapist. she is having less medial knee pain and feels this has helped a great deal.    Patient is accompained by:  -- husband    Pertinent History  right TKR revision  06/25/2017; left TKR 11/04/2016; arthritis; patient enjoys outdoor activities, hiking    Limitations  Sitting;Standing;Walking;House hold activities    Patient Stated Goals  to walk and move without difficulty and not using AD    Currently in Pain?  Other (Comment) stiffness in right knee expecially in the morning          [facility] PT Assessment - 08/25/17 1828      Assessment   Medical Diagnosis  S/P total right knee replacement revision    Referring Provider  Gaynelle Arabian MD    Onset Date/Surgical Date  06/25/17      Prior Function   Level of Independence  Independent    Vocation  Retired    Leisure  travel, outdoor activities, river cruises      Observation/Other Assessments   Lower Extremity Functional Scale   15/80      Objective: AAROM: right  knee flexion pre treatment105 degrees/ post treatment up to 108degrees Palpation:mild spasms along right LE quadriceps, decreased scar mobility along incision distal>proximal Gait: ambulating without AD with improving hip and knee flexion and push off right LE   Treatment: Manual Therapy:59min. : goal: improve soft tissue elasticity, knee ROM STM performed to right LE quadriceps/hamstring muscles with patient sitting; superficial techniquesand myofascial release techniques  Therapeutic Exercise:patient performed with instruction, verbal and tactile cues of therapist: goal: improve LEFS, gait, independent with home program Seated  MAI knee flexion right LE 3 sets, hold 5 seconds each Leg press bilateral 35# x 15 reps and 45# 15reps, 55# x 15 reps with facilitation to engage right LE musculature and tactile cues demonstrating to patient with good results and increased quadriceps activation Hip rotary machine for right hip extension 55# x 10 reps, abduction 25# x 10 reps; left hip extension 55# x 10 reps, abduction 25# x 10 reps with fatigue noted Resistive band walking with assist of therapist, demonstration and guiding of band to allow appropriate resistance: forward and backwards walk ~3 steps x 5 reps, side step to left one step out and back x 5 reps, to right 2 steps out and back x 5 sets  Nustep atendof session: independent (unbilled time) level #4intensityx 43min.  Patient response to treatment:pateint demonstrated improved AAROM right knee flexion up to 108 degrees flexion following STM and exercises in sitting. Patient demonstrated mild fatigue with exercises on hip machine and leg press. Required moderate assistance to perform exercises for hip and resistive band walking, imrpoved with repetition and moderated cuing.         PT Education - 08/25/17 1430    Education provided  Yes    Education Details  exercise insruction/technque for resistive band walking  and hip rotary machine    Person(s) Educated  Patient    Methods  Explanation;Demonstration;Verbal cues    Comprehension  Verbalized understanding;Returned demonstration;Verbal cues required          PT Long Term Goals - 08/25/17 1834      PT LONG TERM GOAL #1   Title  Patient will demonstrate improvement with daily tasks with decreased right knee pain as indicated by LEFS score of 30/80     Baseline  LEFS 15/80: 07/29/17 30/80    Status  Achieved      PT LONG TERM GOAL #2   Title  Patient will demonstrate improvement with daily tasks with decreased right knee pain as indicated by LEFS score of 45/80    Baseline  LEFS 15/80; 08/25/17 40/80    Status  Revised    Target Date  09/08/17      PT LONG TERM GOAL #3   Title  Patient will demonstrate improved functional community ambulation and decreased fall risk without AD and 10MW time of 10 seconds or less     Baseline  10MW 12 seconds    Status  On-going    Target Date  09/08/17      PT LONG TERM GOAL #4   Title  Patient will be independent with home program for pain control and progressive exercise for self managment once discharged from physical therapy    Baseline  limited knoweldge and requires moderate cuing for pain control, exercises and progression    Status  Revised    Target Date  09/08/17      PT LONG TERM GOAL #5   Title  Patient will have AROM right knee flexion to at least 110 or greater to allow improved mobility with transfers and stairs with minimal to no difficulty    Baseline  AROM right knee flexion 0-75 degrees with pain; 07/21/17 0-70 degrees; 07/29/17 0-85 degrees; 08/25/17 105 degrees    Status  Revised    Target Date  09/08/17            Plan - 08/25/17 1439    Clinical Impression Statement Patient is  progressing with ROM up to 108 degrees flexion, improving strength in quadriceps and hip musculature and improving function with daily tasks. She continues with decreased strength in right LE and will  benefit from continued physical therapy intervention to achieve maximal function and allow transition to independent home program.     Rehab Potential  Good    Clinical Impairments Affecting Rehab Potential  (+)acute condition, family support, motivated, previous TKR(-) arthritis, COPD, PVD    PT Frequency  3x / week    PT Duration  6 weeks    PT Treatment/Interventions  Gait training;Neuromuscular re-education;Manual techniques;Patient/family education;Therapeutic exercise;Electrical Stimulation;Cryotherapy    PT Next Visit Plan  pain control, ROM exercise, strength exercises; electrical stimualtion for pain control, re education    PT Home Exercise Plan  ROM, flexibility exercises, strengthening with theraband, walking against resistive band    Consulted and Agree with Plan of Care  Patient       Patient will benefit from skilled therapeutic intervention in order to improve the following deficits and impairments:  Pain, Increased muscle spasms, Decreased strength, Decreased range of motion, Decreased endurance, Decreased activity tolerance, Difficulty walking, Impaired perceived functional ability  Visit Diagnosis: Stiffness of right knee, not elsewhere classified - Plan: PT plan of care cert/re-cert  Difficulty in walking, not elsewhere classified - Plan: PT plan of care cert/re-cert  Muscle weakness (generalized) - Plan: PT plan of care cert/re-cert     Problem List Patient Active Problem List   Diagnosis Date Noted  . Easy bruising 07/21/2017  . 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 OA (osteoarthritis) of knee 11/04/2016  . COPD (chronic obstructive pulmonary disease) ([place]) 12/04/2015  . Ovarian mass, left 10/04/2015  . Osteoporosis 09/05/2015  . Skin lesion 08/01/2015  . Chronic infection of sinus 06/30/2015  . H/O varicella 06/30/2015  . Lung nodule, solitary 06/30/2015  . Allergic rhinitis 05/26/2015  . Bilateral cataracts 05/26/2015  . DD (diverticular  disease) 05/26/2015  . Hypercholesteremia 05/26/2015  . Hypothyroidism 05/26/2015  . Cramps of lower extremity 05/26/2015  . Hemorrhoids, internal 05/26/2015  . Cervical pain 05/26/2015  . 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 Acquired  trigger finger 05/26/2015  . Diverticulitis 05/26/2015    Jomarie Longs PT 08/25/2017, 6:38 PM  [facility] [address] [place], [address] Phone: [phone]   Fax:  [phone]  Name: RITHIKA SEEL MRN: [id number] Date of Birth: February 15, 1941

## 2017-08-27 ENCOUNTER — Ambulatory Visit: Payer: PPO | Attending: Orthopedic Surgery | Admitting: Physical Therapy

## 2017-08-27 ENCOUNTER — Encounter: Payer: Self-pay | Admitting: Physical Therapy

## 2017-08-27 DIAGNOSIS — M6281 Muscle weakness (generalized): Secondary | ICD-10-CM | POA: Diagnosis not present

## 2017-08-27 DIAGNOSIS — M25661 Stiffness of right knee, not elsewhere classified: Secondary | ICD-10-CM | POA: Diagnosis not present

## 2017-08-27 DIAGNOSIS — R262 Difficulty in walking, not elsewhere classified: Secondary | ICD-10-CM | POA: Insufficient documentation

## 2017-08-27 NOTE — Therapy (Signed)
[facility] [address] [place], [address] Phone: [phone]   Fax:  [phone]  Physical Therapy Treatment  Patient Details  Name: [name] [name] MRN: [id number] Date of Birth: 1940-12-27 Referring Provider: Gaynelle Arabian MD   Encounter Date: 08/27/2017  PT End of Session - 08/27/17 1334    Visit Number  20    Number of Visits  24    Date for PT Re-Evaluation  09/08/17    Authorization Type  19    Authorization Time Period  20(G code)    PT Start Time  1104    PT Stop Time  1157    PT Time Calculation (min)  53 min    Activity Tolerance  Patient tolerated treatment well    Behavior During Therapy  [facility] for tasks assessed/performed       Past Medical History:  Diagnosis Date  . Allergy   . Allergy    allergy to spandex "had a bad reaction of blisters when using after a surgery"  . 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 Bruises easily   . Complication of anesthesia    reports history of "trouble waking up after surgery", mother developed fevers questionable malignant hyperthermia   . COPD (chronic obstructive pulmonary disease) ([place])   . Dysrhythmia    frequent PVC's  . Family history of adverse reaction to anesthesia    mother with history of malignant hypertension  . GERD (gastroesophageal reflux disease)   . Heart palpitations   . Hyperlipidemia   . Hypothyroidism   . Peripheral vascular disease ([place]) 1980   pulmonary embolism  . Pneumonia   . Pulmonary embolism ([place])   . PVC (premature ventricular contraction)   . Shortness of breath dyspnea    with extertion  . Thyroid disease     Past Surgical History:  Procedure Laterality Date  . abdominal surgery    . APPENDECTOMY    . BLEPHAROPLASTY Bilateral 11/2014   left eyelid 08/03/2015 Dr. Vickki Muff  . BREAST CYST ASPIRATION Left   . CHOLECYSTECTOMY  03/2004  . COLONOSCOPY    . [place]   fertility testing  . EYE SURGERY Bilateral 2015   cataract excision  . KNEE SURGERY Right    knee arthroscopy-2000; Total knee replacement-2002   . LAPAROTOMY  1969   fertility testing  . patelectomy Right 1972  . Removal of medical meniscus  1972  . TOTAL KNEE ARTHROPLASTY Left 11/04/2016   Procedure: LEFT TOTAL KNEE ARTHROPLASTY;  Surgeon: Gaynelle Arabian, MD;  Location: [facility];  Service: Orthopedics;  Laterality: Left;  requests 3mins  . TOTAL KNEE REVISION Right 06/25/2017   Procedure: RIGHT TOTAL KNEE REVISION;  Surgeon: Gaynelle Arabian, MD;  Location: [facility];  Service: Orthopedics;  Laterality: Right;  . UPPER GI ENDOSCOPY      There were no vitals filed for this visit.  Subjective Assessment - 08/27/17 1104    Subjective  Patient reports increased sharp stabbing pain above her knee yesterday for no apparent reason. She is walking with cane outside the home.     Patient is accompained by:  -- husband    Pertinent History  right TKR revision 06/25/2017; left TKR 11/04/2016; arthritis; patient enjoys outdoor activities, hiking    Limitations  Sitting;Standing;Walking;House hold activities    Patient Stated Goals  to walk and move without difficulty and not using  AD    Currently in Pain?  Other (Comment) right knee soreness medial aspect      Objective: AAROM: right knee flexion pre treatment100degrees/ post treatment up to 108degrees Palpation:mild spasms along right LE quadriceps, decreased scar mobility along incision distal>proximal, tender along medial aspect of right knee Gait: ambulating without AD with improving hip and knee flexion and push off right LE Strength: right knee extension at least 4/5, flexion 4/5 within available ROM  Treatment: Manual Therapy:64min. : goal: improve soft tissue elasticity, knee ROM STM performed to right LE quadriceps/hamstring muscles with patient sitting and supine; superficial techniquesand myofascial release  techniques  Therapeutic Exercise:patient performed with instruction, verbal and tactile cues of therapist: goal: improve LEFS, gait, independent with home program Seated  MAI knee flexion right LE 3 sets, hold 5 seconds each Leg press bilateral25#x 15 repsand35#15reps with no increased pain   Hip rotary machine for right hip extension 55# x 10 reps, abduction 25# x 10 reps; left hip extension 55# x 10 reps, abduction 25# x 10 reps with fatigue noted Taping performed to right knee for swelling/pain along medial aspect   Nustep atendof session: independent (unbilled time) level #4intensityx 64min.  Patient response to treatment:Patient demonstrated improved soft tissue elasticity by 50% along quadriceps muscle laterally and decreased pain with knee extension at end of session. Patient verbalized good understanding of home exercises to continue for self management.    PT Education - 08/27/17 1350    Education provided  Yes    Education Details  exercise instruction/technique and home program re assessed    Person(s) Educated  Patient    Methods  Explanation;Demonstration;Verbal cues    Comprehension  Verbalized understanding;Returned demonstration;Verbal cues required          PT Long Term Goals - 08/25/17 1834      PT LONG TERM GOAL #1   Title  Patient will demonstrate improvement with daily tasks with decreased right knee pain as indicated by LEFS score of 30/80     Baseline  LEFS 15/80: 07/29/17 30/80    Status  Achieved      PT LONG TERM GOAL #2   Title  Patient will demonstrate improvement with daily tasks with decreased right knee pain as indicated by LEFS score of 45/80    Baseline  LEFS 15/80; 08/25/17 40/80    Status  Revised    Target Date  09/08/17      PT LONG TERM GOAL #3   Title  Patient will demonstrate improved functional community ambulation and decreased fall risk without AD and 10MW time of 10 seconds or less     Baseline  10MW 12 seconds     Status  On-going    Target Date  09/08/17      PT LONG TERM GOAL #4   Title  Patient will be independent with home program for pain control and progressive exercise for self managment once discharged from physical therapy    Baseline  limited knoweldge and requires moderate cuing for pain control, exercises and progression    Status  Revised    Target Date  09/08/17      PT LONG TERM GOAL #5   Title  Patient will have AROM right knee flexion to at least 110 or greater to allow improved mobility with transfers and stairs with minimal to no difficulty    Baseline  AROM right knee flexion 0-75 degrees with pain; 07/21/17 0-70 degrees; 07/29/17 0-85 degrees; 08/25/17 105 degrees  Status  Revised    Target Date  09/08/17            Plan - 08/27/17 1200    Clinical Impression Statement  Patient is doing well and progressing with ROM, self management and strength right LE s/p TKA. She is going to have follow up with Dr. Wynelle Link next week and feels she will be able to continue on own with self management of exercises, taping prn, and returning to local fitness facility with silver sneakers.     Rehab Potential  Good    Clinical Impairments Affecting Rehab Potential  (+)acute condition, family support, motivated, previous TKR(-) arthritis, COPD, PVD    PT Frequency  3x / week    PT Duration  6 weeks    PT Treatment/Interventions  Gait training;Neuromuscular re-education;Manual techniques;Patient/family education;Therapeutic exercise;Electrical Stimulation;Cryotherapy    PT Next Visit Plan  pain control, ROM exercise, strength exercises; electrical stimualtion for pain control, re education    PT Home Exercise Plan  ROM, flexibility exercises, strengthening with theraband, walking against resistive band    Consulted and Agree with Plan of Care  Patient       Patient will benefit from skilled therapeutic intervention in order to improve the following deficits and impairments:  Pain,  Increased muscle spasms, Decreased strength, Decreased range of motion, Decreased endurance, Decreased activity tolerance, Difficulty walking, Impaired perceived functional ability  Visit Diagnosis: Stiffness of right knee, not elsewhere classified  Difficulty in walking, not elsewhere classified  Muscle weakness (generalized)     Problem List Patient Active Problem List   Diagnosis Date Noted  . Easy bruising 07/21/2017  . 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 OA (osteoarthritis) of knee 11/04/2016  . COPD (chronic obstructive pulmonary disease) ([place]) 12/04/2015  . Ovarian mass, left 10/04/2015  . Osteoporosis 09/05/2015  . Skin lesion 08/01/2015  . Chronic infection of sinus 06/30/2015  . H/O varicella 06/30/2015  . Lung nodule, solitary 06/30/2015  . Allergic rhinitis 05/26/2015  . Bilateral cataracts 05/26/2015  . 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 Hypercholesteremia 05/26/2015  . Hypothyroidism 05/26/2015  . Cramps of lower extremity 05/26/2015  . Hemorrhoids, internal 05/26/2015  . Cervical pain 05/26/2015  . 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 Acquired trigger finger 05/26/2015  . Diverticulitis 05/26/2015    Jomarie Longs PT 08/28/2017, 1:51 PM  [facility] [address] [place], [address] Phone: [phone]   Fax:  [phone]  Name: [name] [name] MRN: [id number] Date of Birth: November 30, 1940

## 2017-08-28 ENCOUNTER — Encounter: Payer: PPO | Admitting: Physical Therapy

## 2017-09-03 ENCOUNTER — Encounter: Payer: PPO | Admitting: Physical Therapy

## 2017-09-10 ENCOUNTER — Other Ambulatory Visit: Payer: PPO

## 2017-09-16 ENCOUNTER — Other Ambulatory Visit: Payer: Self-pay

## 2017-09-18 ENCOUNTER — Telehealth: Payer: Self-pay

## 2017-09-18 ENCOUNTER — Ambulatory Visit
Admission: RE | Admit: 2017-09-18 | Discharge: 2017-09-18 | Disposition: A | Discharge status: Home / Self Care | Payer: PPO | Source: Ambulatory Visit | Attending: Cardiothoracic Surgery | Admitting: Cardiothoracic Surgery

## 2017-09-18 DIAGNOSIS — I700 Atherosclerosis of aorta: Secondary | ICD-10-CM | POA: Diagnosis not present

## 2017-09-18 DIAGNOSIS — J439 Emphysema, unspecified: Secondary | ICD-10-CM | POA: Insufficient documentation

## 2017-09-18 DIAGNOSIS — R911 Solitary pulmonary nodule: Secondary | ICD-10-CM

## 2017-09-18 DIAGNOSIS — I2510 Atherosclerotic heart disease of native coronary artery without angina pectoris: Secondary | ICD-10-CM | POA: Insufficient documentation

## 2017-09-18 DIAGNOSIS — R918 Other nonspecific abnormal finding of lung field: Secondary | ICD-10-CM | POA: Insufficient documentation

## 2017-09-18 DIAGNOSIS — D3502 Benign neoplasm of left adrenal gland: Secondary | ICD-10-CM | POA: Insufficient documentation

## 2017-09-18 NOTE — Telephone Encounter (Signed)
LMTCB Need to see if patient still wants to get Shingrix, also needs to check with her insurance on [employer], RMA

## 2017-09-18 NOTE — Telephone Encounter (Signed)
Patient advised to call insurance for shingles vaccine coverage. Patient reports she is interested in getting the vaccine as well as her husband. Patient will call back to schedule. sd

## 2017-09-19 ENCOUNTER — Ambulatory Visit (INDEPENDENT_AMBULATORY_CARE_PROVIDER_SITE_OTHER): Payer: PPO | Admitting: Cardiothoracic Surgery

## 2017-09-19 ENCOUNTER — Encounter: Payer: Self-pay | Admitting: Cardiothoracic Surgery

## 2017-09-19 VITALS — BP 133/69 | HR 76 | Temp 98.3°F | Wt 137.0 lb

## 2017-09-19 DIAGNOSIS — R911 Solitary pulmonary nodule: Secondary | ICD-10-CM | POA: Diagnosis not present

## 2017-09-19 NOTE — Progress Notes (Signed)
ID: [name] [name], female   DOB: 1941-01-29, 77 y.o.   MRN: [id number]  HISTORY: She returns today in follow-up.  She has no new complaints.  She did undergo right knee replacement and is recovering from that.  She does not smoke.  Is been almost 20 years since she quit.  Vitals:   09/19/17 1016  BP: 133/69  Pulse: 76  Temp: 98.3 F (36.8 C)  SpO2: 95%     EXAM:    Resp: Lungs are clear bilaterally.  No respiratory distress, normal effort. Heart:  Regular without murmurs Abd:  Abdomen is soft, non distended and non tender. No masses are palpable.  There is no rebound and no guarding.  Neurological: Alert and oriented to person, place, and time. Coordination normal.  Skin: Skin is warm and dry. No rash noted. No diaphoretic. No erythema. No pallor.  Psychiatric: Normal mood and affect. Normal behavior. Judgment and thought content normal.    ASSESSMENT: She did have a chest CT which have independently reviewed.  There are multiple bilateral pulmonary nodules.  None of these have been significantly changed over the last year.   PLAN:   The CT scan is unchanged and therefore we did not recommend any additional follow-up.  She will contact my office if she has any further questions.    Nestor Lewandowsky, MD

## 2017-09-19 NOTE — Addendum Note (Signed)
Addended by: Wayna Chalet on: 09/19/2017 12:54 PM   Modules accepted: Orders

## 2017-09-22 ENCOUNTER — Ambulatory Visit
Admission: RE | Admit: 2017-09-22 | Discharge: 2017-09-22 | Disposition: A | Discharge status: Home / Self Care | Payer: PPO | Source: Ambulatory Visit | Attending: Family Medicine | Admitting: Family Medicine

## 2017-09-22 DIAGNOSIS — M8588 Other specified disorders of bone density and structure, other site: Secondary | ICD-10-CM | POA: Diagnosis not present

## 2017-09-22 DIAGNOSIS — M85851 Other specified disorders of bone density and structure, right thigh: Secondary | ICD-10-CM | POA: Diagnosis not present

## 2017-09-22 DIAGNOSIS — Z780 Asymptomatic menopausal state: Secondary | ICD-10-CM | POA: Diagnosis not present

## 2017-09-22 DIAGNOSIS — Z1231 Encounter for screening mammogram for malignant neoplasm of breast: Secondary | ICD-10-CM | POA: Diagnosis not present

## 2017-09-22 DIAGNOSIS — M810 Age-related osteoporosis without current pathological fracture: Secondary | ICD-10-CM | POA: Diagnosis not present

## 2017-09-23 ENCOUNTER — Other Ambulatory Visit: Payer: Self-pay | Admitting: Family Medicine

## 2017-09-23 ENCOUNTER — Telehealth: Payer: Self-pay

## 2017-09-23 ENCOUNTER — Ambulatory Visit (INDEPENDENT_AMBULATORY_CARE_PROVIDER_SITE_OTHER): Payer: PPO | Admitting: Family Medicine

## 2017-09-23 ENCOUNTER — Encounter: Payer: Self-pay | Admitting: Family Medicine

## 2017-09-23 DIAGNOSIS — Z23 Encounter for immunization: Secondary | ICD-10-CM

## 2017-09-23 DIAGNOSIS — R928 Other abnormal and inconclusive findings on diagnostic imaging of breast: Secondary | ICD-10-CM

## 2017-09-23 DIAGNOSIS — R921 Mammographic calcification found on diagnostic imaging of breast: Secondary | ICD-10-CM

## 2017-09-23 NOTE — Telephone Encounter (Signed)
-----   Message from Virginia Crews, MD sent at 09/22/2017  2:12 PM EST ----- Bone testing testing shows osteopenia.  No treatment would be recommended for this.  However, calculations that were done show risk of hip fracture in the last 10 yrs is 10%.  Treatment would be appropriate if patient desires in this case.  Regardless, adequate Ca (1200mg ) and Vit D (1000 units) intake via diet or supplements and weight bearing exercise is recommended.  If patient desires treatment for decreasing risk of fracture, can refer to Endocrinology.  Virginia Crews, MD, MPH [facility] 09/22/2017 2:12 PM

## 2017-09-23 NOTE — Telephone Encounter (Signed)
Left message advising pt. OK per DPR. Asked her to call back if she would like referral.

## 2017-09-23 NOTE — Progress Notes (Signed)
Pt presents for first Shingrix only.

## 2017-09-25 ENCOUNTER — Ambulatory Visit
Admission: RE | Admit: 2017-09-25 | Discharge: 2017-09-25 | Disposition: A | Discharge status: Home / Self Care | Payer: PPO | Source: Ambulatory Visit | Attending: Family Medicine | Admitting: Family Medicine

## 2017-09-25 DIAGNOSIS — R928 Other abnormal and inconclusive findings on diagnostic imaging of breast: Secondary | ICD-10-CM | POA: Diagnosis not present

## 2017-09-25 DIAGNOSIS — R921 Mammographic calcification found on diagnostic imaging of breast: Secondary | ICD-10-CM

## 2017-10-01 ENCOUNTER — Telehealth: Payer: Self-pay

## 2017-10-01 NOTE — Telephone Encounter (Signed)
Pt advised.   Thanks,   -Laura  

## 2017-10-01 NOTE — Telephone Encounter (Signed)
-----   Message from Trinna Post, [place] sent at 10/01/2017  8:31 AM EST ----- Some scattered calcifications in the left breast requiring follow up diagnostic mammogram in 6 months. [facility] should send reminder and schedule this.

## 2017-10-06 ENCOUNTER — Encounter: Payer: Self-pay | Admitting: General Surgery

## 2017-11-04 DIAGNOSIS — E7800 Pure hypercholesterolemia, unspecified: Secondary | ICD-10-CM | POA: Diagnosis not present

## 2017-11-04 DIAGNOSIS — R002 Palpitations: Secondary | ICD-10-CM | POA: Diagnosis not present

## 2017-11-04 DIAGNOSIS — J42 Unspecified chronic bronchitis: Secondary | ICD-10-CM | POA: Diagnosis not present

## 2017-11-17 ENCOUNTER — Other Ambulatory Visit: Payer: Self-pay | Admitting: Family Medicine

## 2017-11-17 MED ORDER — ALBUTEROL SULFATE HFA 108 (90 BASE) MCG/ACT IN AERS: 1.0000 | INHALATION_SPRAY | RESPIRATORY_TRACT | 12 refills | Status: AC | PRN

## 2017-11-18 ENCOUNTER — Other Ambulatory Visit: Payer: Self-pay | Admitting: Physician Assistant

## 2017-11-18 ENCOUNTER — Ambulatory Visit (INDEPENDENT_AMBULATORY_CARE_PROVIDER_SITE_OTHER): Payer: PPO | Admitting: Family Medicine

## 2017-11-18 ENCOUNTER — Encounter: Payer: Self-pay | Admitting: Family Medicine

## 2017-11-18 VITALS — BP 90/58 | HR 89 | Temp 98.5°F | Wt 139.0 lb

## 2017-11-18 DIAGNOSIS — J449 Chronic obstructive pulmonary disease, unspecified: Secondary | ICD-10-CM

## 2017-11-18 DIAGNOSIS — R6889 Other general symptoms and signs: Secondary | ICD-10-CM | POA: Diagnosis not present

## 2017-11-18 MED ORDER — OSELTAMIVIR PHOSPHATE 75 MG PO CAPS: 75.0000 mg | ORAL_CAPSULE | Freq: Two times a day (BID) | ORAL | 0 refills | Status: DC

## 2017-11-18 MED ORDER — AZITHROMYCIN 250 MG PO TABS: ORAL_TABLET | ORAL | 0 refills | Status: DC

## 2017-11-18 NOTE — Progress Notes (Signed)
Patient: [name] [name] Female    DOB: 1941-01-09   77 y.o.   MRN: [id number] Visit Date: 11/18/2017  Today's Provider: Vernie Murders, PA   Chief Complaint  Patient presents with  . URI   Subjective:    URI   This is a new problem. The current episode started yesterday. Maximum temperature: 99.0. Associated symptoms include congestion, coughing, headaches and sneezing. Associated symptoms comments: Body aches . She has tried inhaler use and acetaminophen (Robitussin) for the symptoms. The treatment provided no relief.   Past Medical History:  Diagnosis Date  . Allergy   . Allergy    allergy to spandex "had a bad reaction of blisters when using after a surgery"  . 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 Bruises easily   . Complication of anesthesia    reports history of "trouble waking up after surgery", mother developed fevers questionable malignant hyperthermia   . COPD (chronic obstructive pulmonary disease) ([place])   . Dysrhythmia    frequent PVC's  . Family history of adverse reaction to anesthesia    mother with history of malignant hypertension  . GERD (gastroesophageal reflux disease)   . Heart palpitations   . Hyperlipidemia   . Hypothyroidism   . Peripheral vascular disease ([place]) 1980   pulmonary embolism  . Pneumonia   . Pulmonary embolism ([place])   . PVC (premature ventricular contraction)   . Shortness of breath dyspnea    with extertion  . Thyroid disease    Past Surgical History:  Procedure Laterality Date  . abdominal surgery    . APPENDECTOMY    . BLEPHAROPLASTY Bilateral 11/2014   left eyelid 08/03/2015 Dr. Vickki Muff  . BREAST CYST ASPIRATION Left   . CHOLECYSTECTOMY  03/2004  . COLONOSCOPY    . [place]   fertility testing  . EYE SURGERY Bilateral 2015   cataract excision  . KNEE SURGERY Right    knee arthroscopy-2000; Total knee replacement-2002   . LAPAROTOMY  1969   fertility  testing  . patelectomy Right 1972  . Removal of medical meniscus  1972  . TOTAL KNEE ARTHROPLASTY Left 11/04/2016   Procedure: LEFT TOTAL KNEE ARTHROPLASTY;  Surgeon: Gaynelle Arabian, MD;  [facility];  Service: Orthopedics;  Laterality: Left;  requests 75mins  . TOTAL KNEE REVISION Right 06/25/2017   Procedure: RIGHT TOTAL KNEE REVISION;  Surgeon: Gaynelle Arabian, MD;  Location[facility];  Service: Orthopedics;  Laterality: Right;  . UPPER GI ENDOSCOPY     Family History  Problem Relation Age of Onset  . Diabetes Mother   . Hypertension Mother   . Congestive Heart Failure Mother   . Osteoporosis Mother   . Heart disease Father   . Hypertension Father   . Cancer Sister        lung  . Heart disease Sister   . Hyperlipidemia Sister   . Paget's disease of bone Sister   . Hyperthyroidism Sister   . Cancer Sister   . Osteoporosis Sister   . Breast cancer Neg Hx    Allergies  Allergen Reactions  . Latex Itching  . Codeine Other (See Comments)    Unknown  . Diphenhydramine Other (See Comments)    Decreased BP and pulse rate  . Montelukast Sodium Other (See Comments)    Insomnia  . Morphine Sulfate Other (See Comments)    Unknown  . Nsaids  Other (See Comments)    Can take Meloxicam, Naproxen  . Other Other (See Comments)    Opiates-nausea/vomiting  Spandex-itching/rash/blisters  Latex-itching  . Tramadol Other (See Comments)    Mental status change  . Celecoxib Rash and Other (See Comments)    Epigastric pain  . Rofecoxib Rash and Other (See Comments)    Epigastric pain (vioxx)    Current Outpatient Medications:  .  acetaminophen (TYLENOL) 500 MG tablet, Take 500 mg by mouth every 6 (six) hours as needed for moderate pain or headache. , Disp: , Rfl:  .  ADVAIR DISKUS 250-50 MCG/DOSE AEPB, Inhale 1 puff into the lungs 2 (two) times daily., Disp: 60 each, Rfl: 5 .  albuterol (VENTOLIN HFA) 108 (90 Base) MCG/ACT inhaler, Inhale 1-2 puffs into the lungs every 4 (four)  hours as needed for wheezing or shortness of breath. Reported on 12/04/2015, Disp: 1 Inhaler, Rfl: 12 .  benzonatate (TESSALON) 100 MG capsule, Take 1 capsule (100 mg total) by mouth daily., Disp: 30 capsule, Rfl: 1 .  docusate sodium (COLACE) 100 MG capsule, Take 100 mg by mouth daily., Disp: , Rfl:  .  fexofenadine (ALLEGRA) 180 MG tablet, Take 180 mg by mouth daily as needed for allergies. , Disp: , Rfl:  .  flecainide (TAMBOCOR) 50 MG tablet, Take 50 mg by mouth 2 (two) times daily., Disp: , Rfl:  .  levothyroxine (SYNTHROID, LEVOTHROID) 75 MCG tablet, Take 1 tablet (75 mcg total) by mouth daily., Disp: 90 tablet, Rfl: 3 .  meloxicam (MOBIC) 7.5 MG tablet, Take 1 tablet by mouth 1 day or 1 dose., Disp: , Rfl:  .  methocarbamol (ROBAXIN) 500 MG tablet, Take 1 tablet (500 mg total) by mouth every 6 (six) hours as needed for muscle spasms., Disp: 80 tablet, Rfl: 0 .  polyethylene glycol (MIRALAX / GLYCOLAX) packet, Take 17 g by mouth at bedtime as needed for moderate constipation. , Disp: , Rfl:  .  tiotropium (SPIRIVA HANDIHALER) 18 MCG inhalation capsule, Place 1 capsule (18 mcg total) into inhaler and inhale daily., Disp: 90 capsule, Rfl: 3 .  Triamcinolone Acetonide (NASACORT AQ NA), Place 2 sprays into both nostrils daily., Disp: , Rfl:  .  TURMERIC CURCUMIN PO, Take 1 tablet by mouth 1 day or 1 dose., Disp: , Rfl:   Review of Systems  Constitutional: Negative.   HENT: Positive for congestion and sneezing.   Respiratory: Positive for cough.   Cardiovascular: Negative.   Neurological: Positive for headaches.   Social History   Tobacco Use  . Smoking status: Former Smoker    Last attempt to quit: 08/25/1998    Years since quitting: 19.2  . Smokeless tobacco: Never Used  Substance Use Topics  . Alcohol use: Yes    Comment: liquor occasionally; bottle of wine every week    Objective:   BP (!) 90/58 (BP Location: Right Arm, Patient Position: Sitting, Cuff Size: Normal)   Pulse 89    Temp 98.5 F (36.9 C) (Oral)   Wt 139 lb (63 kg)   SpO2 96%   BMI 24.62 kg/m   Physical Exam  Constitutional: She is oriented to person, place, and time. She appears well-developed and well-nourished. No distress.  HENT:  Head: Normocephalic and atraumatic.  Right Ear: Hearing and external ear normal.  Left Ear: Hearing and external ear normal.  Nose: Nose normal.  Slightly red and cobblestoning of posterior pharynx. No sinus tenderness.  Eyes: Conjunctivae and lids are normal. Right eye exhibits  no discharge. Left eye exhibits no discharge. No scleral icterus.  Neck: Neck supple.  Cardiovascular: Normal rate.  Pulmonary/Chest: Effort normal and breath sounds normal. No respiratory distress.  Abdominal: Soft.  Musculoskeletal: Normal range of motion.  Lymphadenopathy:    She has no cervical adenopathy.  Neurological: She is alert and oriented to person, place, and time.  Skin: Skin is intact. No lesion and no rash noted.  Psychiatric: She has a normal mood and affect. Her speech is normal and behavior is normal. Thought content normal.      Assessment & Plan:     1. Flu-like symptoms Developed increase in temperature with cough and congestion since yesterday. May use Tessalon Perles and inhalers for wheeze and cough. Husband diagnosed with flu-like symptoms yesterday and started on Tamiflu. She is having similar symptoms today. Will give Tamiflu empirically and continue symptomatic treatment. Increase fluid intake. Recheck prn. - oseltamivir (TAMIFLU) 75 MG capsule; Take 1 capsule (75 mg total) by mouth 2 (two) times daily.  Dispense: 10 capsule; Refill: 0  2. Chronic obstructive pulmonary disease, unspecified COPD type ([place]) Having cough with yellowish sputum that is worse at night and early morning. History of COPD and past pneumonia concerning during this illness. Continue Spiriva, Albuterol and Advair prn. Will give Z-pak to start if worsening of sputum production and cough.  May use Tessalon Perles for cough suppression. - azithromycin (ZITHROMAX) 250 MG tablet; Take 2 tablets by mouth the first day then one daily for 4 days.  Dispense: 6 tablet; Refill: [place]  [facility]

## 2017-11-21 ENCOUNTER — Ambulatory Visit: Payer: Self-pay | Admitting: Family Medicine

## 2017-12-03 DIAGNOSIS — Z87898 Personal history of other specified conditions: Secondary | ICD-10-CM | POA: Diagnosis not present

## 2017-12-03 DIAGNOSIS — J42 Unspecified chronic bronchitis: Secondary | ICD-10-CM | POA: Diagnosis not present

## 2017-12-03 DIAGNOSIS — R002 Palpitations: Secondary | ICD-10-CM | POA: Diagnosis not present

## 2017-12-03 DIAGNOSIS — E7800 Pure hypercholesterolemia, unspecified: Secondary | ICD-10-CM | POA: Diagnosis not present

## 2017-12-17 ENCOUNTER — Ambulatory Visit (INDEPENDENT_AMBULATORY_CARE_PROVIDER_SITE_OTHER): Payer: PPO | Admitting: Family Medicine

## 2017-12-17 DIAGNOSIS — Z23 Encounter for immunization: Secondary | ICD-10-CM | POA: Diagnosis not present

## 2017-12-17 NOTE — Progress Notes (Signed)
Pt presents for second Shingrix only.

## 2018-01-11 ENCOUNTER — Encounter: Payer: Self-pay | Admitting: Family Medicine

## 2018-01-20 ENCOUNTER — Ambulatory Visit: Payer: Self-pay | Admitting: Family Medicine

## 2018-01-23 ENCOUNTER — Other Ambulatory Visit: Payer: Self-pay | Admitting: Physician Assistant

## 2018-01-23 DIAGNOSIS — E039 Hypothyroidism, unspecified: Secondary | ICD-10-CM

## 2018-01-23 DIAGNOSIS — M1990 Unspecified osteoarthritis, unspecified site: Secondary | ICD-10-CM

## 2018-01-26 ENCOUNTER — Encounter: Payer: Self-pay | Admitting: Family Medicine

## 2018-01-26 ENCOUNTER — Ambulatory Visit (INDEPENDENT_AMBULATORY_CARE_PROVIDER_SITE_OTHER): Payer: PPO | Admitting: Family Medicine

## 2018-01-26 VITALS — BP 114/72 | HR 73 | Temp 97.4°F | Resp 16 | Wt 135.0 lb

## 2018-01-26 DIAGNOSIS — E7800 Pure hypercholesterolemia, unspecified: Secondary | ICD-10-CM

## 2018-01-26 DIAGNOSIS — E039 Hypothyroidism, unspecified: Secondary | ICD-10-CM

## 2018-01-26 DIAGNOSIS — J449 Chronic obstructive pulmonary disease, unspecified: Secondary | ICD-10-CM | POA: Diagnosis not present

## 2018-01-26 NOTE — Assessment & Plan Note (Signed)
Not on medications Monitor annual lipid panel to ensure no significant worsening Patient knows risks and benefits of statin

## 2018-01-26 NOTE — Assessment & Plan Note (Signed)
Stable and asymptomatic Recheck TSH Adjust synthroid dose prn pending TSH

## 2018-01-26 NOTE — Progress Notes (Signed)
Patient: [name] [name] Female    DOB: Dec 24, 1940   77 y.o.   MRN: [id number] Visit Date: 01/26/2018  Today's Provider: Lavon Paganini, MD   I, Martha Clan, CMA, am acting as scribe for Lavon Paganini, MD.  Chief Complaint  Patient presents with  . Hyperlipidemia  . COPD   Subjective:    HPI    Lipid/Cholesterol, Follow-up:   Last seen for this 6 months ago.  Management changes since that visit include checking labs. PCP recommended starting a statin, but pt declined due to previous side effects. . Last Lipid Panel:    Component Value Date/Time   CHOL 250 (H) 07/21/2017 1116   CHOL 267 (H) 07/04/2016 1110   TRIG 95 07/21/2017 1116   HDL 94 07/21/2017 1116   HDL 106 07/04/2016 1110   CHOLHDL 2.7 07/21/2017 1116   LDLCALC 136 (H) 07/21/2017 1116    Risk factors for vascular disease include hypercholesterolemia  Current diet: in general, a "healthy" diet    Wt Readings from Last 3 Encounters:  01/26/18 135 lb (61.2 kg)  11/18/17 139 lb (63 kg)  09/19/17 137 lb (62.1 kg)   -------------------------------------------------------------------  COPD, Follow up:  She was last seen for this 6 months ago. Changes made include continuing Advair and Spiriva.   She reports good compliance with treatment. She is not having side effects.  she is/isnot using rescue inhaler. Only when she had the flu. She IS experiencing cough. She is NOT experiencing dyspnea, wheezing, fatigue, weight increase, weight loss, chills, fever, increased sputum or colored sputum. she report breathing is Unchanged.  Pulmonary Functions Testing Results:  No results found for: FEV1, FVC, FEV1FVC, TLC  ------------------------------------------------------------------------ Hypothyroidism: Reports good compliance with Synthroid without side effects.  Denies cold/heat intolerance, weight gain/loss, dry skin/hair, constipation, diarrhea.   Allergies  Allergen Reactions  . Latex  Itching  . Codeine Other (See Comments)    Unknown  . Diphenhydramine Other (See Comments)    Decreased BP and pulse rate  . Montelukast Sodium Other (See Comments)    Insomnia  . Morphine Sulfate Other (See Comments)    Unknown  . Nsaids Other (See Comments)    Can take Meloxicam, Naproxen  . Other Other (See Comments)    Opiates-nausea/vomiting  Spandex-itching/rash/blisters  Latex-itching  . Tramadol Other (See Comments)    Mental status change  . Celecoxib Rash and Other (See Comments)    Epigastric pain  . Rofecoxib Rash and Other (See Comments)    Epigastric pain (vioxx)     Current Outpatient Medications:  .  acetaminophen (TYLENOL) 500 MG tablet, Take 500 mg by mouth every 6 (six) hours as needed for moderate pain or headache. , Disp: , Rfl:  .  ADVAIR DISKUS 250-50 MCG/DOSE AEPB, Inhale 1 puff into the lungs 2 (two) times daily., Disp: 60 each, Rfl: 5 .  albuterol (VENTOLIN HFA) 108 (90 Base) MCG/ACT inhaler, Inhale 1-2 puffs into the lungs every 4 (four) hours as needed for wheezing or shortness of breath. Reported on 12/04/2015, Disp: 1 Inhaler, Rfl: 12 .  calcium-vitamin D (OSCAL WITH D) 500-200 MG-UNIT tablet, Take 1 tablet by mouth., Disp: , Rfl:  .  docusate sodium (COLACE) 100 MG capsule, Take 100 mg by mouth daily., Disp: , Rfl:  .  fexofenadine (ALLEGRA) 180 MG tablet, Take 180 mg by mouth daily as needed for allergies. , Disp: , Rfl:  .  flecainide (TAMBOCOR) 50 MG tablet, Take 50 mg  by mouth 2 (two) times daily., Disp: , Rfl:  .  levothyroxine (SYNTHROID, LEVOTHROID) 75 MCG tablet, Take 1 tablet by mouth once daily, Disp: 90 tablet, Rfl: 2 .  meloxicam (MOBIC) 7.5 MG tablet, Take 1 tablet by mouth 1 day or 1 dose., Disp: , Rfl:  .  methocarbamol (ROBAXIN) 500 MG tablet, Take 1 tablet (500 mg total) by mouth every 6 (six) hours as needed for muscle spasms., Disp: 80 tablet, Rfl: 0 .  polyethylene glycol (MIRALAX / GLYCOLAX) packet, Take 17 g by mouth at bedtime  as needed for moderate constipation. , Disp: , Rfl:  .  SPIRIVA HANDIHALER 18 MCG inhalation capsule, Inhale two puffs from the same capsule once a day via handihaler, Disp: 90 capsule, Rfl: 2 .  Triamcinolone Acetonide (NASACORT AQ NA), Place 2 sprays into both nostrils daily., Disp: , Rfl:  .  TURMERIC CURCUMIN PO, Take 1 tablet by mouth 1 day or 1 dose., Disp: , Rfl:   Review of Systems  Constitutional: Negative for activity change, appetite change, chills, diaphoresis, fatigue, fever and unexpected weight change.  Respiratory: Positive for cough and shortness of breath. Negative for wheezing.   Cardiovascular: Positive for palpitations. Negative for chest pain and leg swelling.    Social History   Tobacco Use  . Smoking status: Former Smoker    Last attempt to quit: 08/25/1998    Years since quitting: 19.4  . Smokeless tobacco: Never Used  Substance Use Topics  . Alcohol use: Yes    Comment: liquor occasionally; bottle of wine every week    Objective:   BP 114/72 (BP Location: Left Arm, Patient Position: Sitting, Cuff Size: Normal)   Pulse 73   Temp (!) 97.4 F (36.3 C) (Oral)   Resp 16   Wt 135 lb (61.2 kg)   SpO2 92%   BMI 23.91 kg/m  Vitals:   01/26/18 1056  BP: 114/72  Pulse: 73  Resp: 16  Temp: (!) 97.4 F (36.3 C)  TempSrc: Oral  SpO2: 92%  Weight: 135 lb (61.2 kg)     Physical Exam  Constitutional: She is oriented to person, place, and time. She appears well-developed and well-nourished. No distress.  HENT:  Head: Normocephalic and atraumatic.  Eyes: Conjunctivae are normal. No scleral icterus.  Neck: Neck supple. No thyromegaly present.  Cardiovascular: Normal rate, regular rhythm, normal heart sounds and intact distal pulses.  No murmur heard. Pulmonary/Chest: Effort normal and breath sounds normal. No respiratory distress. She has no wheezes. She has no rales.  Musculoskeletal: She exhibits no edema.  Lymphadenopathy:    She has no cervical  adenopathy.  Neurological: She is alert and oriented to person, place, and time.  Skin: Skin is warm and dry. Capillary refill takes less than 2 seconds. No rash noted.  Psychiatric: She has a normal mood and affect. Her behavior is normal.  Vitals reviewed.      Assessment & Plan:     Problem List Items Addressed This Visit      Respiratory   COPD (chronic obstructive pulmonary disease) ([place])    Well controlled Continue spiriva and advair Continue albuterol prn F/u in 6 months or prn for flares        Endocrine   Hypothyroidism - Primary    Stable and asymptomatic Recheck TSH Adjust synthroid dose prn pending TSH      Relevant Orders   TSH     Other   Hypercholesteremia    Not on medications Monitor  annual lipid panel to ensure no significant worsening Patient knows risks and benefits of statin          Return in about 6 months (around 07/28/2018) for AWV/CPE.   The entirety of the information documented in the History of Present Illness, Review of Systems and Physical Exam were personally obtained by me. Portions of this information were initially documented by Raquel Sarna Ratchford, CMA and reviewed by me for thoroughness and accuracy.    Virginia Crews, MD, MPH [facility] 01/26/2018 11:24 AM

## 2018-01-26 NOTE — Assessment & Plan Note (Signed)
Well controlled Continue spiriva and advair Continue albuterol prn F/u in 6 months or prn for flares

## 2018-01-27 ENCOUNTER — Telehealth: Payer: Self-pay

## 2018-01-27 LAB — TSH: TSH: 1.43 u[IU]/mL (ref 0.450–4.500)

## 2018-01-27 NOTE — Telephone Encounter (Signed)
-----   Message from Virginia Crews, MD sent at 01/27/2018  8:28 AM EDT ----- Normal Thyroid function.  Continue Synthroid at current dose  Virginia Crews, MD, MPH [facility] 01/27/2018 8:28 AM

## 2018-01-27 NOTE — Telephone Encounter (Signed)
Viewed by Glenetta Borg on 01/27/2018 8:51 AM

## 2018-02-10 ENCOUNTER — Other Ambulatory Visit: Payer: Self-pay | Admitting: Physician Assistant

## 2018-03-02 IMAGING — CT CT CHEST W/O CM
2 of 3 series · 15 of 46 positions shown, 17 images · non-contrast
Comparison: Multiple priors, most recently PET-CT 09/04/2015. Chest
CT 08/01/2015.

CLINICAL DATA: 74-year-old female with history of pulmonary nodule.
Followup study.

EXAM:
CT CHEST WITHOUT CONTRAST
TECHNIQUE: Multidetector CT imaging of the chest was performed following the
standard protocol without IV contrast.

[Series 2: routine chest wo · axial · 0.62mm/px · z∈[-317,-47]mm · 12 of 62 slices shown, 14 images]
[im 4/62  soft-tissue]
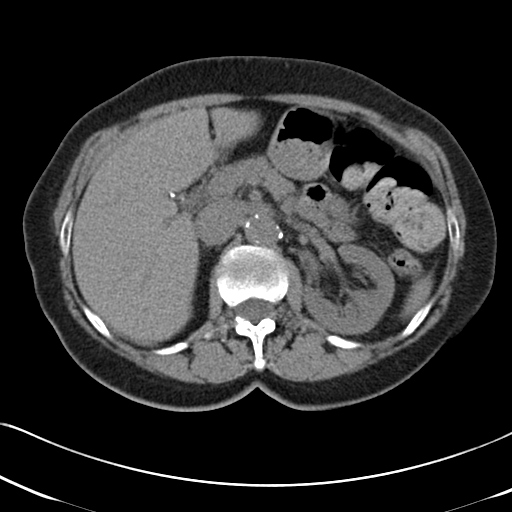
[im 4/62  bone]
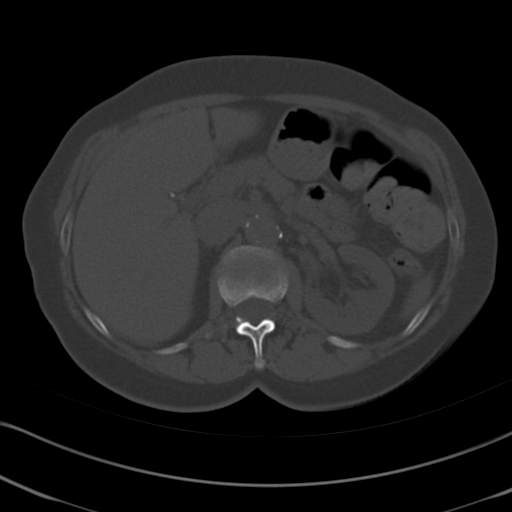
[im 8/62  soft-tissue]
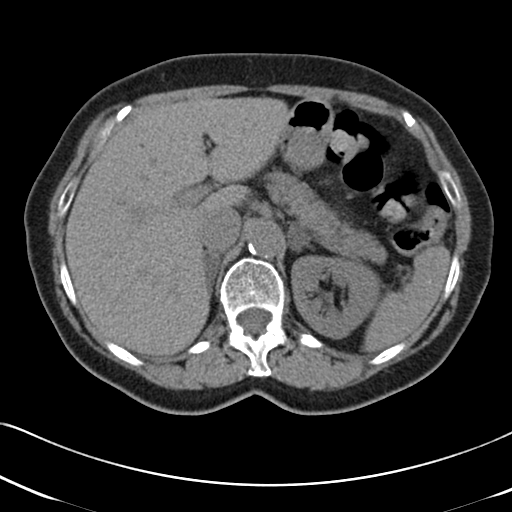
[im 14/62  soft-tissue]
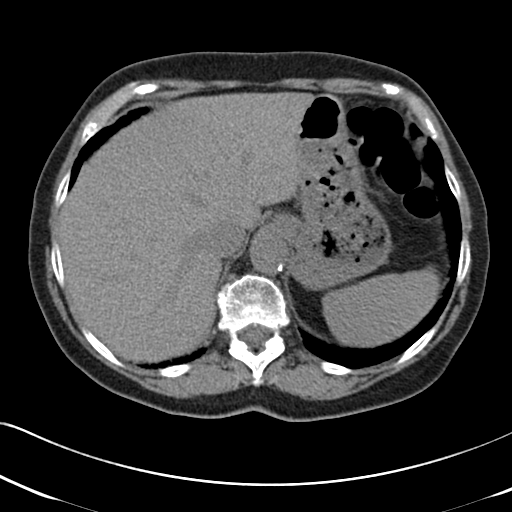
[im 18/62  soft-tissue]
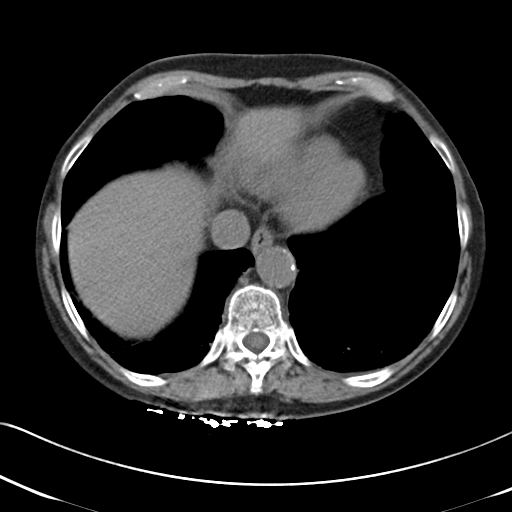
[im 24/62  soft-tissue]
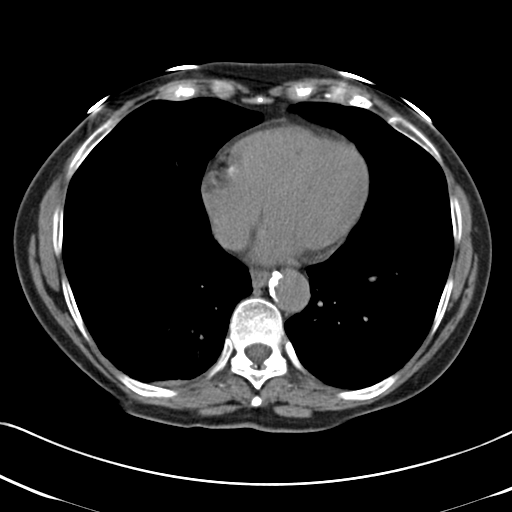
[im 28/62  soft-tissue]
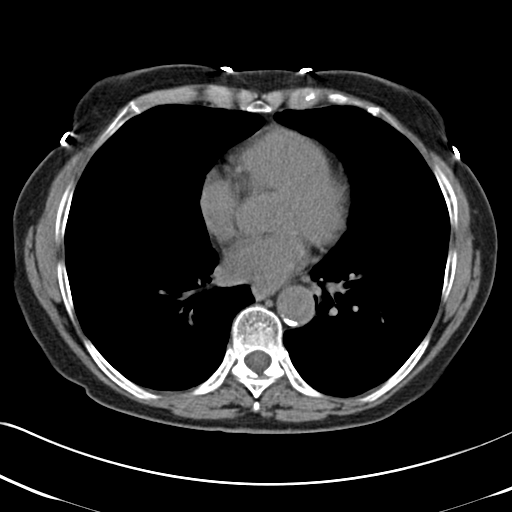
[im 34/62  soft-tissue]
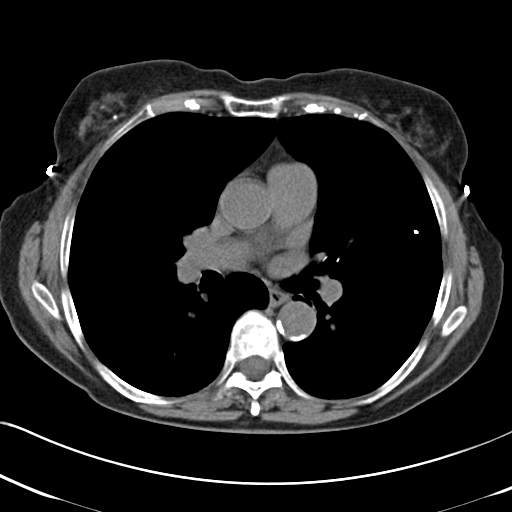
[im 38/62  soft-tissue]
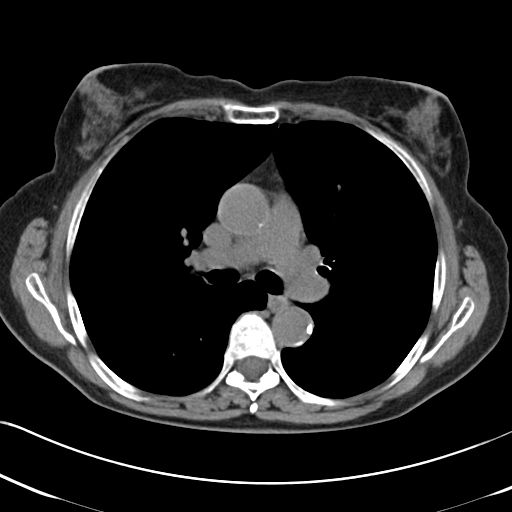
[im 44/62  soft-tissue]
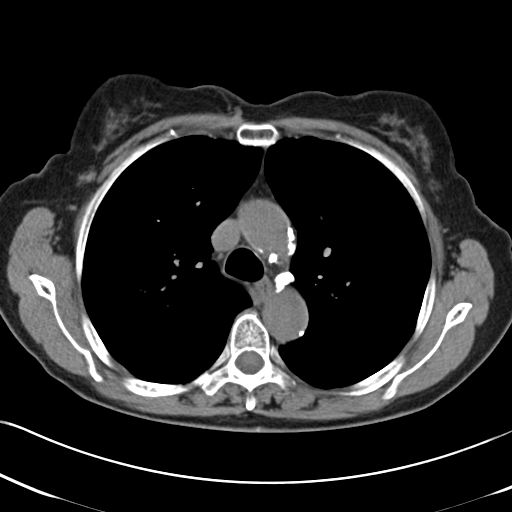
[im 44/62  bone]
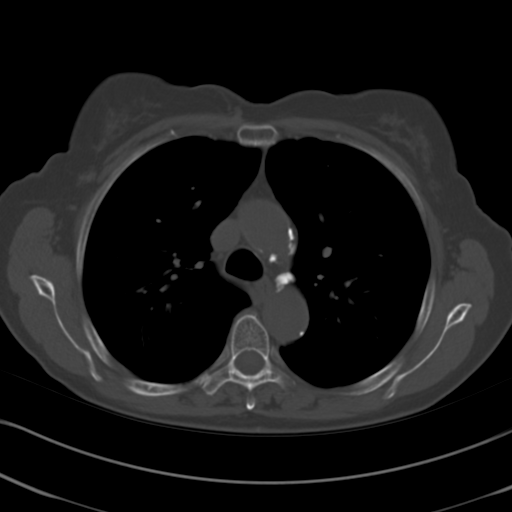
[im 48/62  soft-tissue]
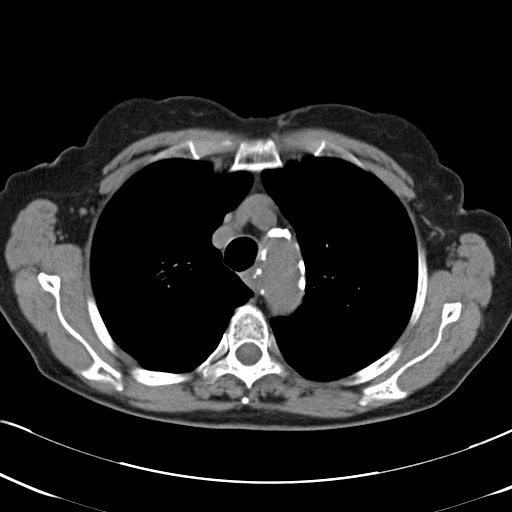
[im 54/62  soft-tissue]
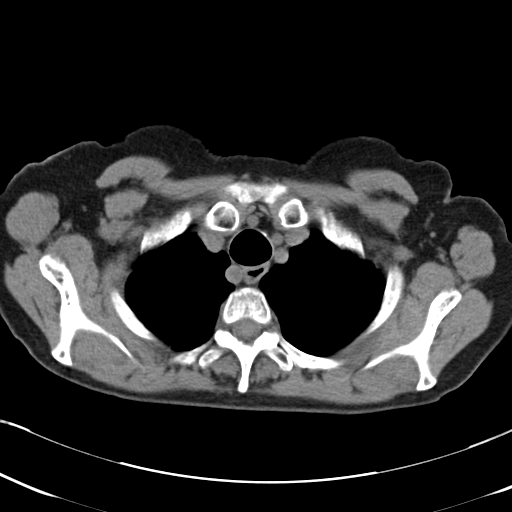
[im 58/62  soft-tissue]
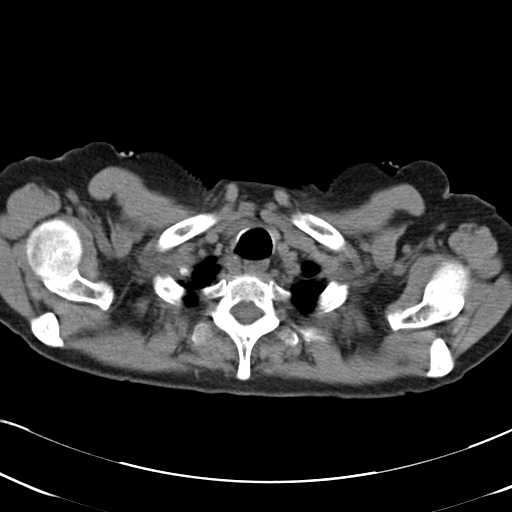

[Series 5: routine chest wo cor · coronal · 0.58mm/px · 3 of 112 slices shown]
[im 38/112  soft-tissue]
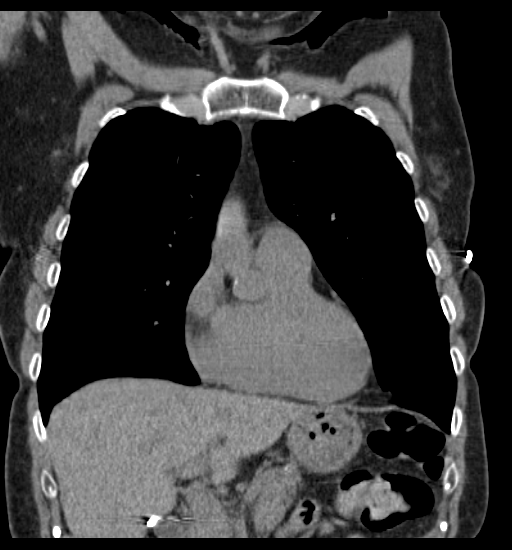
[im 50/112  soft-tissue]
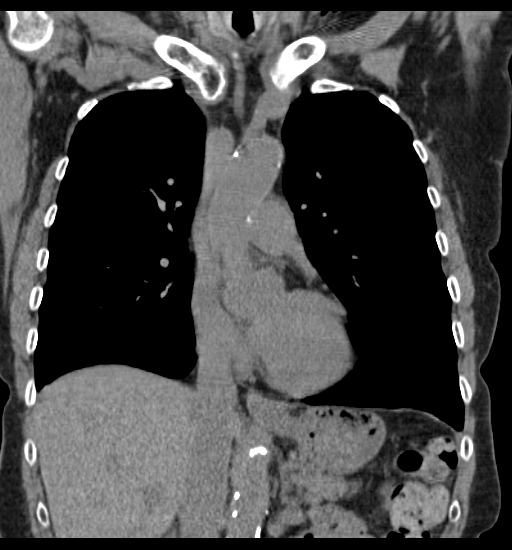
[im 62/112  soft-tissue]
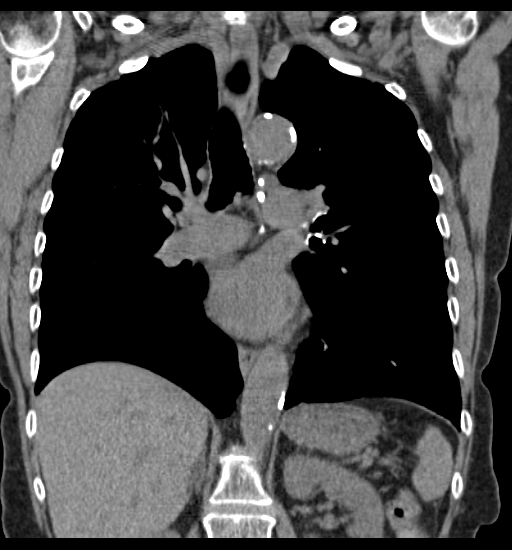

[15 of 46 positions shown; findings below may reference images not displayed]

FINDINGS: Mediastinum/Lymph Nodes: Heart size is normal. There is no
significant pericardial fluid, thickening or pericardial
calcification. There is atherosclerosis of the thoracic aorta, the
great vessels of the mediastinum and the coronary arteries,
including calcified atherosclerotic plaque in the left anterior
descending and right coronary arteries. No pathologically enlarged
mediastinal or hilar lymph nodes. Please note that accurate
exclusion of hilar adenopathy is limited on noncontrast CT scans.
Esophagus is unremarkable in appearance. Aberrant right subclavian
artery (normal anatomical variant) incidentally noted. No axillary
lymphadenopathy.

Lungs/Pleura: The nodule of concern on prior examinations in the
right lower lobe has completely resolved, presumably infectious or
inflammatory on prior studies. 4 mm nodule in the lateral segment of
the right middle lobe (image 88 of series 3) is unchanged compared
to prior studies dating back to at least 04/04/2011, considered
benign. Tiny calcified granulomas. No acute consolidative airspace
disease. In the anterior aspect of the left upper lobe there is a
spiculated 7 x 5 x 6 mm nodule which has increased in size compared
to the prior examinations (most recently 4 mm on prior study
09/04/2015), and is completely new compared to remote prior study
07/11/2011. No pleural effusions. Chronic area of architectural
distortion in the right upper lobe, similar to prior examinations,
compatible with post infectious/inflammatory scarring. Mild diffuse
bronchial wall thickening with severe centrilobular and mild
paraseptal emphysema.

Upper Abdomen: Status post cholecystectomy.  Atherosclerosis.

Musculoskeletal/Soft Tissues: There are no aggressive appearing
lytic or blastic lesions noted in the visualized portions of the
skeleton.
IMPRESSION: 1. Previously noted right lower lobe pulmonary nodule has completely
resolved.
2. However, there is a slowly growing nodule in the anterior aspect
of the left upper lobe (image 48 of series 3) which has spiculated
margins and currently measures 7 x 5 x 6 mm (previously only 4 mm).
This nodule is below the size for accurate evaluation with PET
imaging, however, close attention on followup studies is strongly
recommended as this is suspicious for small neoplasm. At this time,
repeat noncontrast chest CT is recommended in 3-6 months.
3. Mild diffuse bronchial wall thickening with severe centrilobular
and mild paraseptal emphysema; imaging findings suggestive of
underlying COPD.
4. Atherosclerosis, including two vessel coronary artery disease.
Assessment for potential risk factor modification, dietary therapy
or pharmacologic therapy may be warranted, if clinically indicated.
5. Additional incidental findings, as above.

## 2018-06-03 DIAGNOSIS — J42 Unspecified chronic bronchitis: Secondary | ICD-10-CM | POA: Diagnosis not present

## 2018-06-03 DIAGNOSIS — Z87898 Personal history of other specified conditions: Secondary | ICD-10-CM | POA: Diagnosis not present

## 2018-06-04 DIAGNOSIS — M25562 Pain in left knee: Secondary | ICD-10-CM | POA: Diagnosis not present

## 2018-06-04 DIAGNOSIS — M25561 Pain in right knee: Secondary | ICD-10-CM | POA: Diagnosis not present

## 2018-06-04 DIAGNOSIS — M25569 Pain in unspecified knee: Secondary | ICD-10-CM | POA: Insufficient documentation

## 2018-06-04 DIAGNOSIS — M7062 Trochanteric bursitis, left hip: Secondary | ICD-10-CM | POA: Diagnosis not present

## 2018-06-17 ENCOUNTER — Ambulatory Visit (INDEPENDENT_AMBULATORY_CARE_PROVIDER_SITE_OTHER): Payer: PPO

## 2018-06-17 DIAGNOSIS — Z23 Encounter for immunization: Secondary | ICD-10-CM | POA: Diagnosis not present

## 2018-06-18 DIAGNOSIS — H35371 Puckering of macula, right eye: Secondary | ICD-10-CM | POA: Diagnosis not present

## 2018-07-07 ENCOUNTER — Telehealth: Payer: Self-pay

## 2018-07-07 NOTE — Telephone Encounter (Signed)
LMTCB and schedule AWV for any time between 07/21/18-07/27/18 (cpe). -MM

## 2018-07-07 NOTE — Telephone Encounter (Signed)
Called husband to see about scheduling his AWV prior to his CPE on 07/31/18 and pt stated that him and Adie were currently out of town and would be back prior to Thanksgiving and would call in then to schedule. -MM

## 2018-07-21 NOTE — Telephone Encounter (Signed)
Scheduled AWV for 07/22/18 @ 920 AM. -MM

## 2018-07-22 ENCOUNTER — Ambulatory Visit (INDEPENDENT_AMBULATORY_CARE_PROVIDER_SITE_OTHER): Payer: PPO

## 2018-07-22 VITALS — BP 132/60 | HR 66 | Temp 97.6°F | Ht 63.0 in | Wt 130.4 lb

## 2018-07-22 DIAGNOSIS — Z0000 Encounter for general adult medical examination without abnormal findings: Secondary | ICD-10-CM

## 2018-07-22 NOTE — Patient Instructions (Addendum)
[name] , Thank you for taking time to come for your Medicare Wellness Visit. I appreciate your ongoing commitment to your health goals. Please review the following plan we discussed and let me know if I can assist you in the future.   Screening recommendations/referrals: Colonoscopy: Up to date, due 10/2018 Mammogram: Up to date, due 08/2019 Bone Density: Up to date, due 09/22/2022 Recommended yearly ophthalmology/optometry visit for glaucoma screening and checkup Recommended yearly dental visit for hygiene and checkup  Vaccinations: Influenza vaccine: Up to date Pneumococcal vaccine: Completed series Tdap vaccine: Up to date, due 11/01/2020 Shingles vaccine: Completed series    Advanced directives: Currently on file.   Conditions/risks identified: None.   Next appointment: 07/27/18 @ 2:00 PM with Dr Brita Romp.    Preventive Care 23 Years and Older, Female Preventive care refers to lifestyle choices and visits with your health care provider that can promote health and wellness. What does preventive care include?  A yearly physical exam. This is also called an annual well check.  Dental exams once or twice a year.  Routine eye exams. Ask your health care provider how often you should have your eyes checked.  Personal lifestyle choices, including:  Daily care of your teeth and gums.  Regular physical activity.  Eating a healthy diet.  Avoiding tobacco and drug use.  Limiting alcohol use.  Practicing safe sex.  Taking low-dose aspirin every day.  Taking vitamin and mineral supplements as recommended by your health care provider. What happens during an annual well check? The services and screenings done by your health care provider during your annual well check will depend on your age, overall health, lifestyle risk factors, and family history of disease. Counseling  Your health care provider may ask you questions about your:  Alcohol use.  Tobacco  use.  Drug use.  Emotional well-being.  Home and relationship well-being.  Sexual activity.  Eating habits.  History of falls.  Memory and ability to understand (cognition).  Work and work Statistician.  Reproductive health. Screening  You may have the following tests or measurements:  Height, weight, and BMI.  Blood pressure.  Lipid and cholesterol levels. These may be checked every 5 years, or more frequently if you are over 37 years old.  Skin check.  Lung cancer screening. You may have this screening every year starting at age 83 if you have a 30-pack-year history of smoking and currently smoke or have quit within the past 15 years.  Fecal occult blood test (FOBT) of the stool. You may have this test every year starting at age 9.  Flexible sigmoidoscopy or colonoscopy. You may have a sigmoidoscopy every 5 years or a colonoscopy every 10 years starting at age 96.  Hepatitis C blood test.  Hepatitis B blood test.  Sexually transmitted disease (STD) testing.  Diabetes screening. This is done by checking your blood sugar (glucose) after you have not eaten for a while (fasting). You may have this done every 1-3 years.  Bone density scan. This is done to screen for osteoporosis. You may have this done starting at age 31.  Mammogram. This may be done every 1-2 years. Talk to your health care provider about how often you should have regular mammograms. Talk with your health care provider about your test results, treatment options, and if necessary, the need for more tests. Vaccines  Your health care provider may recommend certain vaccines, such as:  Influenza vaccine. This is recommended every year.  Tetanus, diphtheria, and  acellular pertussis (Tdap, Td) vaccine. You may need a Td booster every 10 years.  Zoster vaccine. You may need this after age 97.  Pneumococcal 13-valent conjugate (PCV13) vaccine. One dose is recommended after age 62.  Pneumococcal  polysaccharide (PPSV23) vaccine. One dose is recommended after age 2. Talk to your health care provider about which screenings and vaccines you need and how often you need them. This information is not intended to replace advice given to you by your health care provider. Make sure you discuss any questions you have with your health care provider. Document Released: 09/08/2015 Document Revised: 05/01/2016 Document Reviewed: 06/13/2015 Elsevier Interactive Patient Education  2017 [place] Prevention in the Home Falls can cause injuries. They can happen to people of all ages. There are many things you can do to make your home safe and to help prevent falls. What can I do on the outside of my home?  Regularly fix the edges of walkways and driveways and fix any cracks.  Remove anything that might make you trip as you walk through a door, such as a raised step or threshold.  Trim any bushes or trees on the path to your home.  Use bright outdoor lighting.  Clear any walking paths of anything that might make someone trip, such as rocks or tools.  Regularly check to see if handrails are loose or broken. Make sure that both sides of any steps have handrails.  Any raised decks and porches should have guardrails on the edges.  Have any leaves, snow, or ice cleared regularly.  Use sand or salt on walking paths during winter.  Clean up any spills in your garage right away. This includes oil or grease spills. What can I do in the bathroom?  Use night lights.  Install grab bars by the toilet and in the tub and shower. Do not use towel bars as grab bars.  Use non-skid mats or decals in the tub or shower.  If you need to sit down in the shower, use a plastic, non-slip stool.  Keep the floor dry. Clean up any water that spills on the floor as soon as it happens.  Remove soap buildup in the tub or shower regularly.  Attach bath mats securely with double-sided non-slip rug  tape.  Do not have throw rugs and other things on the floor that can make you trip. What can I do in the bedroom?  Use night lights.  Make sure that you have a light by your bed that is easy to reach.  Do not use any sheets or blankets that are too big for your bed. They should not hang down onto the floor.  Have a firm chair that has side arms. You can use this for support while you get dressed.  Do not have throw rugs and other things on the floor that can make you trip. What can I do in the kitchen?  Clean up any spills right away.  Avoid walking on wet floors.  Keep items that you use a lot in easy-to-reach places.  If you need to reach something above you, use a strong step stool that has a grab bar.  Keep electrical cords out of the way.  Do not use floor polish or wax that makes floors slippery. If you must use wax, use non-skid floor wax.  Do not have throw rugs and other things on the floor that can make you trip. What can I do with my stairs?  Do not leave any items on the stairs.  Make sure that there are handrails on both sides of the stairs and use them. Fix handrails that are broken or loose. Make sure that handrails are as long as the stairways.  Check any carpeting to make sure that it is firmly attached to the stairs. Fix any carpet that is loose or worn.  Avoid having throw rugs at the top or bottom of the stairs. If you do have throw rugs, attach them to the floor with carpet tape.  Make sure that you have a light switch at the top of the stairs and the bottom of the stairs. If you do not have them, ask someone to add them for you. What else can I do to help prevent falls?  Wear shoes that:  Do not have high heels.  Have rubber bottoms.  Are comfortable and fit you well.  Are closed at the toe. Do not wear sandals.  If you use a stepladder:  Make sure that it is fully opened. Do not climb a closed stepladder.  Make sure that both sides of the  stepladder are locked into place.  Ask someone to hold it for you, if possible.  Clearly mark and make sure that you can see:  Any grab bars or handrails.  First and last steps.  Where the edge of each step is.  Use tools that help you move around (mobility aids) if they are needed. These include:  Canes.  Walkers.  Scooters.  Crutches.  Turn on the lights when you go into a dark area. Replace any light bulbs as soon as they burn out.  Set up your furniture so you have a clear path. Avoid moving your furniture around.  If any of your floors are uneven, fix them.  If there are any pets around you, be aware of where they are.  Review your medicines with your doctor. Some medicines can make you feel dizzy. This can increase your chance of falling. Ask your doctor what other things that you can do to help prevent falls. This information is not intended to replace advice given to you by your health care provider. Make sure you discuss any questions you have with your health care provider. Document Released: 06/08/2009 Document Revised: 01/18/2016 Document Reviewed: 09/16/2014 Elsevier Interactive Patient Education  2017 [employer].

## 2018-07-22 NOTE — Progress Notes (Signed)
Subjective:   [name] [name] is a 77 y.o. female who presents for Medicare Annual (Subsequent) preventive examination.  Review of Systems:  N/A  Cardiac Risk Factors include: advanced age (>29men, >1 women);dyslipidemia     Objective:     Vitals: BP 132/60 (BP Location: Right Arm)   Pulse 66   Temp 97.6 F (36.4 C) (Oral)   Ht 5\' 3"  (1.6 m)   Wt 130 lb 6.4 oz (59.1 kg)   BMI 23.10 kg/m   Body mass index is 23.1 kg/m.  Advanced Directives 07/22/2018 06/30/2017 06/25/2017 06/25/2017 06/20/2017 11/21/2016 11/04/2016  Does Patient Have a Medical Advance Directive? Yes Yes Yes Yes Yes Yes Yes  Type of Paramedic of [place];Living will [place];Living will [place];Living will [place];Living will [place];Living will [place];Living will [place];Living will  Does patient want to make changes to medical advance directive? - No - Patient declined No - Patient declined - No - Patient declined No - Patient declined No - Patient declined  Copy of [place] in Chart? Yes - validated most recent copy scanned in chart (See row information) - No - copy requested No - copy requested No - copy requested - -    Tobacco Social History   Tobacco Use  Smoking Status Former Smoker  . Last attempt to quit: 08/25/1998  . Years since quitting: 19.9  Smokeless Tobacco Never Used     Counseling given: Not Answered   Clinical Intake:  Pre-visit preparation completed: Yes  Pain : No/denies pain Pain Score: 0-No pain     Nutritional Status: BMI of 19-24  Normal Nutritional Risks: None Diabetes: No  How often do you need to have someone help you when you read instructions, pamphlets, or other written materials from your doctor or pharmacy?: 1 - Never  Interpreter Needed?: No  Information entered by :: [facility], LPN  Past  Medical History:  Diagnosis Date  . Allergy   . Allergy    allergy to spandex "had a bad reaction of blisters when using after a surgery"  . 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 Bruises easily   . Complication of anesthesia    reports history of "trouble waking up after surgery", mother developed fevers questionable malignant hyperthermia   . COPD (chronic obstructive pulmonary disease) ([place])   . Dysrhythmia    frequent PVC's  . Family history of adverse reaction to anesthesia    mother with history of malignant hypertension  . GERD (gastroesophageal reflux disease)   . Heart palpitations   . Hyperlipidemia   . Hypothyroidism   . Peripheral vascular disease ([place]) 1980   pulmonary embolism  . Pneumonia   . Pulmonary embolism ([place])   . PVC (premature ventricular contraction)   . Shortness of breath dyspnea    with extertion  . Thyroid disease    Past Surgical History:  Procedure Laterality Date  . abdominal surgery    . APPENDECTOMY    . BLEPHAROPLASTY Bilateral 11/2014   left eyelid 08/03/2015 Dr. Vickki Muff  . BREAST CYST ASPIRATION Left   . CHOLECYSTECTOMY  03/2004  . COLONOSCOPY    . [place]   fertility testing  . EYE SURGERY Bilateral 2015   cataract excision  . KNEE SURGERY Right    knee arthroscopy-2000; Total knee replacement-2002   .  LAPAROTOMY  1969   fertility testing  . patelectomy Right 1972  . Removal of medical meniscus  1972  . TOTAL KNEE ARTHROPLASTY Left 11/04/2016   Procedure: LEFT TOTAL KNEE ARTHROPLASTY;  Surgeon: Gaynelle Arabian, MD;  Location: [facility];  Service: Orthopedics;  Laterality: Left;  requests 67mins  . TOTAL KNEE REVISION Right 06/25/2017   Procedure: RIGHT TOTAL KNEE REVISION;  Surgeon: Gaynelle Arabian, MD;  Location: [facility];  Service: Orthopedics;  Laterality: Right;  . UPPER GI ENDOSCOPY     Family History  Problem Relation Age of Onset  . Diabetes Mother   . Hypertension  Mother   . Congestive Heart Failure Mother   . Osteoporosis Mother   . Heart disease Father   . Hypertension Father   . Cancer Sister        lung  . Heart disease Sister   . Hyperlipidemia Sister   . Paget's disease of bone Sister   . Hyperthyroidism Sister   . Cancer Sister   . Osteoporosis Sister   . Breast cancer Neg Hx    Social History   Socioeconomic History  . Marital status: Married    Spouse name: Richard  . Number of children: 0  . Years of education: Not on file  . Highest education level: Bachelor's degree (e.g., BA, AB, BS)  Occupational History  . Occupation: retired  Scientific laboratory technician  . Financial resource strain: Not hard at all  . Food insecurity:    Worry: Never true    Inability: Never true  . Transportation needs:    Medical: No    Non-medical: No  Tobacco Use  . Smoking status: Former Smoker    Last attempt to quit: 08/25/1998    Years since quitting: 19.9  . Smokeless tobacco: Never Used  Substance and Sexual Activity  . Alcohol use: Yes    Alcohol/week: 2.0 standard drinks    Types: 2 Glasses of wine per week    Comment: liquor occasionally  . Drug use: No  . Sexual activity: Not on file  Lifestyle  . Physical activity:    Days per week: 7 days    Minutes per session: 120 min  . Stress: Only a little  Relationships  . Social connections:    Talks on phone: Patient refused    Gets together: Patient refused    Attends religious service: Patient refused    Active member of club or organization: Patient refused    Attends meetings of clubs or organizations: Patient refused    Relationship status: Patient refused  Other Topics Concern  . Not on file  Social History Narrative  . Not on file    Outpatient Encounter Medications as of 07/22/2018  Medication Sig  . acetaminophen (TYLENOL) 500 MG tablet Take 500 mg by mouth every 6 (six) hours as needed for moderate pain or headache.   . ADVAIR DISKUS 250-50 MCG/DOSE AEPB Inhale 1 puff by  mouth into the lungs twice a day  . albuterol (VENTOLIN HFA) 108 (90 Base) MCG/ACT inhaler Inhale 1-2 puffs into the lungs every 4 (four) hours as needed for wheezing or shortness of breath. Reported on 12/04/2015  . calcium carbonate (TUMS - DOSED IN MG ELEMENTAL CALCIUM) 500 MG chewable tablet Chew 1 tablet by mouth as needed for indigestion or heartburn.  . calcium-vitamin D (OSCAL WITH D) 500-200 MG-UNIT tablet Take 1 tablet by mouth.  . docusate sodium (COLACE) 100 MG capsule Take 100 mg by mouth daily.  Marland Kitchen  fexofenadine (ALLEGRA) 180 MG tablet Take 180 mg by mouth daily as needed for allergies.   . flecainide (TAMBOCOR) 50 MG tablet Take 50 mg by mouth 2 (two) times daily.  Marland Kitchen levothyroxine (SYNTHROID, LEVOTHROID) 75 MCG tablet Take 1 tablet by mouth once daily  . meloxicam (MOBIC) 7.5 MG tablet Take 1 tablet by mouth 1 day or 1 dose.  . methocarbamol (ROBAXIN) 500 MG tablet Take 1 tablet (500 mg total) by mouth every 6 (six) hours as needed for muscle spasms.  . Multiple Vitamins-Minerals (PX COMPLETE SENIOR MULTIVITS PO) daily.   . polyethylene glycol (MIRALAX / GLYCOLAX) packet Take 17 g by mouth at bedtime as needed for moderate constipation.   Marland Kitchen SPIRIVA HANDIHALER 18 MCG inhalation capsule Inhale two puffs from the same capsule once a day via handihaler  . Triamcinolone Acetonide (NASACORT AQ NA) Place 2 sprays into both nostrils daily.   . TURMERIC CURCUMIN PO Take 1 tablet by mouth 2 (two) times daily.    No facility-administered encounter medications on file as of 07/22/2018.     Activities of Daily Living In your present state of health, do you have any difficulty performing the following activities: 07/22/2018  Hearing? Y  Comment Has hearing loss in the left ear. Does not wear hearing aids.  Vision? N  Comment Wears readers as needed.   Difficulty concentrating or making decisions? N  Walking or climbing stairs? N  Dressing or bathing? N  Doing errands, shopping? N    Preparing Food and eating ? N  Using the Toilet? N  In the past six months, have you accidently leaked urine? N  Do you have problems with loss of bowel control? N  Managing your Medications? N  Managing your Finances? N  Housekeeping or managing your Housekeeping? N  Some recent data might be hidden    Patient Care Team: Virginia Crews, MD as PCP - General (Family Medicine) Birder Robson, MD as Referring Physician (Ophthalmology) Ubaldo Glassing Javier Docker, MD as Consulting Physician (Cardiology) Roseanne Kaufman, MD as Consulting Physician (Orthopedic Surgery)    Assessment:   This is a routine wellness examination for [place].  Exercise Activities and Dietary recommendations Current Exercise Habits: Home exercise routine, Type of exercise: walking;treadmill, Time (Minutes): > 60, Frequency (Times/Week): 7, Weekly Exercise (Minutes/Week): 0, Intensity: Moderate  Goals    . Increase water intake     Starting 07/04/16, I will continue drinking 8 glasses of water a day.       Fall Risk Fall Risk  07/22/2018 07/21/2017 07/04/2016 06/28/2015  Falls in the past year? 0 No No Yes  Number falls in past yr: - - - 1  Injury with Fall? - - - No   FALL RISK PREVENTION PERTAINING TO THE HOME:  Any stairs in or around the home WITH handrails? No  Home free of loose throw rugs in walkways, pet beds, electrical cords, etc? Yes  Adequate lighting in your home to reduce risk of falls? Yes   ASSISTIVE DEVICES UTILIZED TO PREVENT FALLS:  Life alert? No  Use of a cane, walker or w/c? No  Grab bars in the bathroom? No  Shower chair or bench in shower? No  Elevated toilet seat or a handicapped toilet? No    TIMED UP AND GO:  Was the test performed? No .    Depression Screen PHQ 2/9 Scores 07/22/2018 07/21/2017 07/04/2016 06/28/2015  PHQ - 2 Score 0 0 1 0     Cognitive Function: Declined  today.      6CIT Screen 07/04/2016  What Year? 0 points  What month? 0 points  What time? 0  points  Count back from 20 0 points  Months in reverse 0 points  Repeat phrase 2 points  Total Score 2    Immunization History  Administered Date(s) Administered  . Influenza Split 05/24/2009, 05/22/2011  . Influenza, High Dose Seasonal PF 05/25/2015, 06/06/2016, 05/08/2017, 06/17/2018  . Influenza,inj,Quad PF,6+ Mos 05/12/2014  . Pneumococcal Conjugate-13 06/24/2014  . Pneumococcal Polysaccharide-23 09/24/2007, 09/23/2013  . Tdap 11/02/2010  . Zoster Recombinat (Shingrix) 09/23/2017, 12/17/2017    Qualifies for Shingles Vaccine? Up to date  Tdap: Up to date  Flu Vaccine: Up to date  Pneumococcal Vaccine: Up to date   Screening Tests Health Maintenance  Topic Date Due  . TETANUS/TDAP  11/01/2020  . DEXA SCAN  09/22/2022  . INFLUENZA VACCINE  Completed  . PNA vac Low Risk Adult  Completed    Cancer Screenings:  Colorectal Screening: Completed 11/10/08. Repeat every 10 years.  Mammogram: Completed 09/25/17.   Bone Density: Completed 09/22/17. Results reflect OSTEOPENIA. Repeat every 5 years.   Lung Cancer Screening: (Low Dose CT Chest recommended if Age 66-80 years, 30 pack-year currently smoking OR have quit w/in 15years.) does not qualify.    Additional Screening:  Vision Screening: Recommended annual ophthalmology exams for early detection of glaucoma and other disorders of the eye.  Dental Screening: Recommended annual dental exams for proper oral hygiene  Community Resource Referral:  CRR required this visit?  No      Plan:  I have personally reviewed and addressed the Medicare Annual Wellness questionnaire and have noted the following in the patient's chart:  A. Medical and social history B. Use of alcohol, tobacco or illicit drugs  C. Current medications and supplements D. Functional ability and status E.  Nutritional status F.  Physical activity G. Advance directives H. List of other physicians I.  Hospitalizations, surgeries, and ER visits in  previous 12 months J.  [place] such as hearing and vision if needed, cognitive and depression L. Referrals and appointments - none  In addition, I have reviewed and discussed with patient certain preventive protocols, quality metrics, and best practice recommendations. A written personalized care plan for preventive services as well as general preventive health recommendations were provided to patient.  See attached scanned questionnaire for additional information.   Signed,  Fabio Neighbors, LPN Nurse Health Advisor   Nurse Recommendations: None.

## 2018-07-27 ENCOUNTER — Telehealth: Payer: Self-pay | Admitting: Family Medicine

## 2018-07-27 ENCOUNTER — Other Ambulatory Visit: Payer: Self-pay

## 2018-07-27 ENCOUNTER — Encounter: Payer: Self-pay | Admitting: Family Medicine

## 2018-07-27 ENCOUNTER — Ambulatory Visit (INDEPENDENT_AMBULATORY_CARE_PROVIDER_SITE_OTHER): Payer: PPO | Admitting: Family Medicine

## 2018-07-27 VITALS — BP 112/64 | HR 79 | Temp 97.4°F | Ht 63.0 in | Wt 131.2 lb

## 2018-07-27 DIAGNOSIS — E7800 Pure hypercholesterolemia, unspecified: Secondary | ICD-10-CM

## 2018-07-27 DIAGNOSIS — Z0000 Encounter for general adult medical examination without abnormal findings: Secondary | ICD-10-CM | POA: Diagnosis not present

## 2018-07-27 DIAGNOSIS — R921 Mammographic calcification found on diagnostic imaging of breast: Secondary | ICD-10-CM

## 2018-07-27 DIAGNOSIS — Z1239 Encounter for other screening for malignant neoplasm of breast: Secondary | ICD-10-CM | POA: Diagnosis not present

## 2018-07-27 DIAGNOSIS — J449 Chronic obstructive pulmonary disease, unspecified: Secondary | ICD-10-CM | POA: Diagnosis not present

## 2018-07-27 DIAGNOSIS — E039 Hypothyroidism, unspecified: Secondary | ICD-10-CM

## 2018-07-27 MED ORDER — LEVOTHYROXINE SODIUM 75 MCG PO TABS: 75.0000 ug | ORAL_TABLET | Freq: Every day | ORAL | 3 refills | Status: AC

## 2018-07-27 NOTE — Assessment & Plan Note (Signed)
Previously well controlled Currently asymptomatic Recheck TSH Adjust Synthroid dose as needed pending TSH

## 2018-07-27 NOTE — Telephone Encounter (Signed)
Merrilee Seashore with [employer] called to ask if it was ok to use a different manufacturer for the Levothyroxine?

## 2018-07-27 NOTE — Progress Notes (Signed)
Patient: [name] [name], Female    DOB: November 03, 1940, 77 y.o.   MRN: [id number] Visit Date: 07/27/2018  Today's Provider: Lavon Paganini, MD   Chief Complaint  Patient presents with  . Annual Exam   Subjective:  I, Tiburcio Pea, CMA, am acting as a scribe for Lavon Paganini, MD.   Patient had a AWE with [facility] on 07/22/2018   Complete Physical CHRISHAWNA [name] is a 77 y.o. female. She feels fairly well. She reports exercising-walking on the treadmill 5 miles per day. She reports she is sleeping fairly well.  -----------------------------------------------------------   Review of Systems  Constitutional: Negative.   HENT: Positive for hearing loss, sneezing and tinnitus.   Eyes: Negative.   Respiratory: Positive for shortness of breath.   Cardiovascular: Positive for palpitations.  Gastrointestinal: Positive for constipation.  Endocrine: Positive for cold intolerance.  Genitourinary: Positive for enuresis and urgency.  Musculoskeletal: Positive for arthralgias and joint swelling.  Skin: Negative.   Allergic/Immunologic: Positive for environmental allergies.  Neurological: Negative.   Hematological: Bruises/bleeds easily.  Psychiatric/Behavioral: Negative.     Social History   Socioeconomic History  . Marital status: Married    Spouse name: Richard  . Number of children: 0  . Years of education: Not on file  . Highest education level: Bachelor's degree (e.g., BA, AB, BS)  Occupational History  . Occupation: retired  Scientific laboratory technician  . Financial resource strain: Not hard at all  . Food insecurity:    Worry: Never true    Inability: Never true  . Transportation needs:    Medical: No    Non-medical: No  Tobacco Use  . Smoking status: Former Smoker    Last attempt to quit: 08/25/1998    Years since quitting: 19.9  . Smokeless tobacco: Never Used  Substance and Sexual Activity  . Alcohol use: Yes    Alcohol/week: 2.0 standard drinks    Types: 2 Glasses  of wine per week    Comment: liquor occasionally  . Drug use: No  . Sexual activity: Not on file  Lifestyle  . Physical activity:    Days per week: 7 days    Minutes per session: 120 min  . Stress: Only a little  Relationships  . Social connections:    Talks on phone: Patient refused    Gets together: Patient refused    Attends religious service: Patient refused    Active member of club or organization: Patient refused    Attends meetings of clubs or organizations: Patient refused    Relationship status: Patient refused  . Intimate partner violence:    Fear of current or ex partner: Patient refused    Emotionally abused: Patient refused    Physically abused: Patient refused    Forced sexual activity: Patient refused  Other Topics Concern  . Not on file  Social History Narrative  . Not on file    Past Medical History:  Diagnosis Date  . Allergy   . Allergy    allergy to spandex "had a bad reaction of blisters when using after a surgery"  . 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 Bruises easily   . Complication of anesthesia    reports history of "trouble waking up after surgery", mother developed fevers questionable malignant hyperthermia   . COPD (chronic obstructive pulmonary disease) ([place])   . Dysrhythmia    frequent PVC's  . Family history of adverse reaction to anesthesia  mother with history of malignant hypertension  . GERD (gastroesophageal reflux disease)   . Heart palpitations   . Hyperlipidemia   . Hypothyroidism   . Peripheral vascular disease ([place]) 1980   pulmonary embolism  . Pneumonia   . Pulmonary embolism ([place])   . PVC (premature ventricular contraction)   . Shortness of breath dyspnea    with extertion  . Thyroid disease      Patient Active Problem List   Diagnosis Date Noted  . Knee pain 06/04/2018  . 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 OA (osteoarthritis) of knee 11/04/2016  . Chronic  constipation 08/26/2016  . COPD (chronic obstructive pulmonary disease) ([place]) 12/04/2015  . Ovarian mass, left 10/04/2015  . Osteoporosis 09/05/2015  . Skin lesion 08/01/2015  . Chronic infection of sinus 06/30/2015  . H/O varicella 06/30/2015  . Lung nodule, solitary 06/30/2015  . Allergic rhinitis 05/26/2015  . Bilateral cataracts 05/26/2015  . 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 Hypercholesteremia 05/26/2015  . Hypothyroidism 05/26/2015  . Hemorrhoids, internal 05/26/2015  . Cervical pain 05/26/2015  . 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 Acquired trigger finger 05/26/2015    Past Surgical History:  Procedure Laterality Date  . abdominal surgery    . APPENDECTOMY    . BLEPHAROPLASTY Bilateral 11/2014   left eyelid 08/03/2015 Dr. Vickki Muff  . BREAST CYST ASPIRATION Left   . CHOLECYSTECTOMY  03/2004  . COLONOSCOPY    . [place]   fertility testing  . EYE SURGERY Bilateral 2015   cataract excision  . KNEE SURGERY Right    knee arthroscopy-2000; Total knee replacement-2002   . LAPAROTOMY  1969   fertility testing  . patelectomy Right 1972  . Removal of medical meniscus  1972  . TOTAL KNEE ARTHROPLASTY Left 11/04/2016   Procedure: LEFT TOTAL KNEE ARTHROPLASTY;  Surgeon: Gaynelle Arabian, MD;  Location: [facility];  Service: Orthopedics;  Laterality: Left;  requests 37mins  . TOTAL KNEE REVISION Right 06/25/2017   Procedure: RIGHT TOTAL KNEE REVISION;  Surgeon: Gaynelle Arabian, MD;  Location: [facility];  Service: Orthopedics;  Laterality: Right;  . UPPER GI ENDOSCOPY      Her family history includes Cancer in her sister and sister; Congestive Heart Failure in her mother; Diabetes in her mother; Heart disease in her father and sister; Hyperlipidemia in her sister; Hypertension in her father and mother; Hyperthyroidism in her sister; Osteoporosis in her mother and sister; Paget's disease of bone in her sister. There is no history of Breast cancer.       Current Outpatient Medications:  .  acetaminophen (TYLENOL) 500 MG tablet, Take 500 mg by mouth every 6 (six) hours as needed for moderate pain or headache. , Disp: , Rfl:  .  ADVAIR DISKUS 250-50 MCG/DOSE AEPB, Inhale 1 puff by mouth into the lungs twice a day, Disp: 180 each, Rfl: 2 .  albuterol (VENTOLIN HFA) 108 (90 Base) MCG/ACT inhaler, Inhale 1-2 puffs into the lungs every 4 (four) hours as needed for wheezing or shortness of breath. Reported on 12/04/2015, Disp: 1 Inhaler, Rfl: 12 .  calcium carbonate (TUMS - DOSED IN MG ELEMENTAL CALCIUM) 500 MG chewable tablet, Chew 1 tablet by mouth as needed for indigestion or heartburn., Disp: , Rfl:  .  calcium-vitamin D (OSCAL WITH D) 500-200 MG-UNIT tablet, Take 1 tablet by mouth., Disp: , Rfl:  .  docusate sodium (COLACE) 100 MG capsule, Take 100 mg by mouth daily., Disp: ,  Rfl:  .  fexofenadine (ALLEGRA) 180 MG tablet, Take 180 mg by mouth daily as needed for allergies. , Disp: , Rfl:  .  flecainide (TAMBOCOR) 50 MG tablet, Take 50 mg by mouth 2 (two) times daily., Disp: , Rfl:  .  levothyroxine (SYNTHROID, LEVOTHROID) 75 MCG tablet, Take 1 tablet (75 mcg total) by mouth daily., Disp: 90 tablet, Rfl: 3 .  meloxicam (MOBIC) 7.5 MG tablet, Take 1 tablet by mouth 1 day or 1 dose., Disp: , Rfl:  .  methocarbamol (ROBAXIN) 500 MG tablet, Take 1 tablet (500 mg total) by mouth every 6 (six) hours as needed for muscle spasms., Disp: 80 tablet, Rfl: 0 .  Multiple Vitamins-Minerals (PX COMPLETE SENIOR MULTIVITS PO), daily. , Disp: , Rfl:  .  polyethylene glycol (MIRALAX / GLYCOLAX) packet, Take 17 g by mouth at bedtime as needed for moderate constipation. , Disp: , Rfl:  .  SPIRIVA HANDIHALER 18 MCG inhalation capsule, Inhale two puffs from the same capsule once a day via handihaler, Disp: 90 capsule, Rfl: 2 .  Triamcinolone Acetonide (NASACORT AQ NA), Place 2 sprays into both nostrils daily. , Disp: , Rfl:  .  TURMERIC CURCUMIN PO, Take 1 tablet by  mouth 2 (two) times daily. , Disp: , Rfl:   Patient Care Team: Virginia Crews, MD as PCP - General (Family Medicine) Birder Robson, MD as Referring Physician (Ophthalmology) Teodoro Spray, MD as Consulting Physician (Cardiology) Roseanne Kaufman, MD as Consulting Physician (Orthopedic Surgery)     Objective:   Vitals: BP 112/64 (BP Location: Left Arm, Patient Position: Sitting, Cuff Size: Normal)   Pulse 79   Temp (!) 97.4 F (36.3 C) (Oral)   Ht 5\' 3"  (1.6 m)   Wt 131 lb 3.2 oz (59.5 kg)   SpO2 98%   BMI 23.24 kg/m   Physical Exam  Constitutional: She is oriented to person, place, and time. She appears well-developed and well-nourished. No distress.  HENT:  Head: Normocephalic and atraumatic.  Right Ear: External ear normal.  Left Ear: External ear normal.  Nose: Nose normal.  Mouth/Throat: Oropharynx is clear and moist.  Eyes: Pupils are equal, round, and reactive to light. Conjunctivae and EOM are normal. No scleral icterus.  Neck: Neck supple. No thyromegaly present.  Cardiovascular: Normal rate, regular rhythm, normal heart sounds and intact distal pulses.  No murmur heard. Pulmonary/Chest: Effort normal and breath sounds normal. No respiratory distress. She has no wheezes. She has no rales.  Abdominal: Soft. Bowel sounds are normal. She exhibits no distension. There is no tenderness. There is no rebound and no guarding.  Musculoskeletal: She exhibits no edema or deformity.  Lymphadenopathy:    She has no cervical adenopathy.  Neurological: She is alert and oriented to person, place, and time.  Skin: Skin is warm and dry. Capillary refill takes less than 2 seconds. No rash noted.  Psychiatric: She has a normal mood and affect. Her behavior is normal.  Vitals reviewed.   Activities of Daily Living In your present state of health, do you have any difficulty performing the following activities: 07/22/2018  Hearing? Y  Comment Has hearing loss in the left  ear. Does not wear hearing aids.  Vision? N  Comment Wears readers as needed.   Difficulty concentrating or making decisions? N  Walking or climbing stairs? N  Dressing or bathing? N  Doing errands, shopping? N  Preparing Food and eating ? N  Using the Toilet? N  In the past  six months, have you accidently leaked urine? N  Do you have problems with loss of bowel control? N  Managing your Medications? N  Managing your Finances? N  Housekeeping or managing your Housekeeping? N  Some recent data might be hidden    Fall Risk Assessment Fall Risk  07/22/2018 07/21/2017 07/04/2016 06/28/2015  Falls in the past year? 0 No No Yes  Number falls in past yr: - - - 1  Injury with Fall? - - - No     Depression Screen PHQ 2/9 Scores 07/22/2018 07/21/2017 07/04/2016 06/28/2015  PHQ - 2 Score 0 0 1 0    Assessment & Plan:    Annual Physical Reviewed patient's Family Medical History Reviewed and updated list of patient's medical providers Assessment of cognitive impairment was done Assessed patient's functional ability Established a written schedule for health screening [place] Completed and Reviewed  Exercise Activities and Dietary recommendations Goals    . Increase water intake     Starting 07/04/16, I will continue drinking 8 glasses of water a day.       Immunization History  Administered Date(s) Administered  . Influenza Split 05/24/2009, 05/22/2011  . Influenza, High Dose Seasonal PF 05/25/2015, 06/06/2016, 05/08/2017, 06/17/2018  . Influenza,inj,Quad PF,6+ Mos 05/12/2014  . Pneumococcal Conjugate-13 06/24/2014  . Pneumococcal Polysaccharide-23 09/24/2007, 09/23/2013  . Tdap 11/02/2010  . Zoster Recombinat (Shingrix) 09/23/2017, 12/17/2017    Health Maintenance  Topic Date Due  . TETANUS/TDAP  11/01/2020  . DEXA SCAN  09/22/2022  . INFLUENZA VACCINE  Completed  . PNA vac Low Risk Adult  Completed     Discussed health benefits of physical  activity, and encouraged her to engage in regular exercise appropriate for her age and condition.    ------------------------------------------------------------------------------------------------------------  Problem List Items Addressed This Visit      Respiratory   COPD (chronic obstructive pulmonary disease) ([place])    Chronic and well controlled Continue Spiriva and Advair Continue albuterol as needed Follow-up in 1 year or sooner as needed for flares        Endocrine   Hypothyroidism    Previously well controlled Currently asymptomatic Recheck TSH Adjust Synthroid dose as needed pending TSH      Relevant Orders   TSH     Other   Hypercholesteremia    Not on medications Monitor annual lipid panel to ensure no significant worsening Patient has been counseled in the past about risks and benefits of statins      Relevant Orders   Lipid panel   Comprehensive metabolic panel    Other Visit Diagnoses    Encounter for annual physical exam    -  Primary   Relevant Orders   CBC   Screening for breast cancer       Relevant Orders   MM 3D SCREEN BREAST UNI RIGHT   Breast calcification, left       Relevant Orders   MM Digital Diagnostic Unilat L       Return in about 1 year (around 07/28/2019) for CPE/AWV.   The entirety of the information documented in the History of Present Illness, Review of Systems and Physical Exam were personally obtained by me. Portions of this information were initially documented by Tiburcio Pea, CMA and reviewed by me for thoroughness and accuracy.    Virginia Crews, MD, MPH [facility] 07/27/2018 3:47 PM

## 2018-07-27 NOTE — Assessment & Plan Note (Signed)
Chronic and well controlled Continue Spiriva and Advair Continue albuterol as needed Follow-up in 1 year or sooner as needed for flares

## 2018-07-27 NOTE — Assessment & Plan Note (Signed)
Not on medications Monitor annual lipid panel to ensure no significant worsening Patient has been counseled in the past about risks and benefits of statins

## 2018-07-27 NOTE — Patient Instructions (Signed)
Preventive Care 65 Years and Older, Female Preventive care refers to lifestyle choices and visits with your health care provider that can promote health and wellness. What does preventive care include?  A yearly physical exam. This is also called an annual well check.  Dental exams once or twice a year.  Routine eye exams. Ask your health care provider how often you should have your eyes checked.  Personal lifestyle choices, including: ? Daily care of your teeth and gums. ? Regular physical activity. ? Eating a healthy diet. ? Avoiding tobacco and drug use. ? Limiting alcohol use. ? Practicing safe sex. ? Taking low-dose aspirin every day. ? Taking vitamin and mineral supplements as recommended by your health care provider. What happens during an annual well check? The services and screenings done by your health care provider during your annual well check will depend on your age, overall health, lifestyle risk factors, and family history of disease. Counseling Your health care provider may ask you questions about your:  Alcohol use.  Tobacco use.  Drug use.  Emotional well-being.  Home and relationship well-being.  Sexual activity.  Eating habits.  History of falls.  Memory and ability to understand (cognition).  Work and work environment.  Reproductive health.  Screening You may have the following tests or measurements:  Height, weight, and BMI.  Blood pressure.  Lipid and cholesterol levels. These may be checked every 5 years, or more frequently if you are over 50 years old.  Skin check.  Lung cancer screening. You may have this screening every year starting at age 55 if you have a 30-pack-year history of smoking and currently smoke or have quit within the past 15 years.  Fecal occult blood test (FOBT) of the stool. You may have this test every year starting at age 50.  Flexible sigmoidoscopy or colonoscopy. You may have a sigmoidoscopy every 5 years or  a colonoscopy every 10 years starting at age 50.  Hepatitis C blood test.  Hepatitis B blood test.  Sexually transmitted disease (STD) testing.  Diabetes screening. This is done by checking your blood sugar (glucose) after you have not eaten for a while (fasting). You may have this done every 1-3 years.  Bone density scan. This is done to screen for osteoporosis. You may have this done starting at age 65.  Mammogram. This may be done every 1-2 years. Talk to your health care provider about how often you should have regular mammograms.  Talk with your health care provider about your test results, treatment options, and if necessary, the need for more tests. Vaccines Your health care provider may recommend certain vaccines, such as:  Influenza vaccine. This is recommended every year.  Tetanus, diphtheria, and acellular pertussis (Tdap, Td) vaccine. You may need a Td booster every 10 years.  Varicella vaccine. You may need this if you have not been vaccinated.  Zoster vaccine. You may need this after age 60.  Measles, mumps, and rubella (MMR) vaccine. You may need at least one dose of MMR if you were born in 1957 or later. You may also need a second dose.  Pneumococcal 13-valent conjugate (PCV13) vaccine. One dose is recommended after age 65.  Pneumococcal polysaccharide (PPSV23) vaccine. One dose is recommended after age 65.  Meningococcal vaccine. You may need this if you have certain conditions.  Hepatitis A vaccine. You may need this if you have certain conditions or if you travel or work in places where you may be exposed to hepatitis   A.  Hepatitis B vaccine. You may need this if you have certain conditions or if you travel or work in places where you may be exposed to hepatitis B.  Haemophilus influenzae type b (Hib) vaccine. You may need this if you have certain conditions.  Talk to your health care provider about which screenings and vaccines you need and how often you  need them. This information is not intended to replace advice given to you by your health care provider. Make sure you discuss any questions you have with your health care provider. Document Released: 09/08/2015 Document Revised: 05/01/2016 Document Reviewed: 06/13/2015 Elsevier Interactive Patient Education  2018 Elsevier Inc.  

## 2018-07-27 NOTE — Telephone Encounter (Signed)
Form faxed back to [employer]

## 2018-07-28 DIAGNOSIS — E039 Hypothyroidism, unspecified: Secondary | ICD-10-CM | POA: Diagnosis not present

## 2018-07-28 DIAGNOSIS — E7800 Pure hypercholesterolemia, unspecified: Secondary | ICD-10-CM | POA: Diagnosis not present

## 2018-07-28 DIAGNOSIS — Z0000 Encounter for general adult medical examination without abnormal findings: Secondary | ICD-10-CM | POA: Diagnosis not present

## 2018-07-29 LAB — LIPID PANEL
CHOLESTEROL TOTAL: 216 mg/dL — AB (ref 100–199)
Chol/HDL Ratio: 2.2 ratio (ref 0.0–4.4)
HDL: 100 mg/dL (ref >39)
LDL CALC: 104 mg/dL — AB (ref 0–99)
TRIGLYCERIDES: 61 mg/dL (ref 0–149)
VLDL Cholesterol Cal: 12 mg/dL (ref 5–40)

## 2018-07-29 LAB — TSH: TSH: 2.17 u[IU]/mL (ref 0.450–4.500)

## 2018-07-29 LAB — COMPREHENSIVE METABOLIC PANEL
ALBUMIN: 4 g/dL (ref 3.5–4.8)
ALT: 19 IU/L (ref 0–32)
AST: 20 IU/L (ref 0–40)
Albumin/Globulin Ratio: 1.7 (ref 1.2–2.2)
Alkaline Phosphatase: 62 IU/L (ref 39–117)
BUN / CREAT RATIO: 19 (ref 12–28)
BUN: 13 mg/dL (ref 8–27)
Bilirubin Total: 1 mg/dL (ref 0.0–1.2)
CALCIUM: 9.6 mg/dL (ref 8.7–10.3)
CO2: 22 mmol/L (ref 20–29)
Chloride: 96 mmol/L (ref 96–106)
Creatinine, Ser: 0.7 mg/dL (ref 0.57–1.00)
GFR, EST AFRICAN AMERICAN: 97 mL/min/{1.73_m2} (ref >59)
GFR, EST NON AFRICAN AMERICAN: 84 mL/min/{1.73_m2} (ref >59)
GLOBULIN, TOTAL: 2.3 g/dL (ref 1.5–4.5)
Glucose: 86 mg/dL (ref 65–99)
POTASSIUM: 4.1 mmol/L (ref 3.5–5.2)
SODIUM: 138 mmol/L (ref 134–144)
TOTAL PROTEIN: 6.3 g/dL (ref 6.0–8.5)

## 2018-07-29 LAB — CBC
Hematocrit: 38.3 % (ref 34.0–46.6)
Hemoglobin: 13.8 g/dL (ref 11.1–15.9)
MCH: 33.7 pg — AB (ref 26.6–33.0)
MCHC: 36 g/dL — ABNORMAL HIGH (ref 31.5–35.7)
MCV: 93 fL (ref 79–97)
PLATELETS: 224 10*3/uL (ref 150–450)
RBC: 4.1 x10E6/uL (ref 3.77–5.28)
RDW: 12.2 % — AB (ref 12.3–15.4)
WBC: 6.5 10*3/uL (ref 3.4–10.8)

## 2018-08-04 ENCOUNTER — Other Ambulatory Visit: Payer: Self-pay | Admitting: Family Medicine

## 2018-08-04 DIAGNOSIS — R921 Mammographic calcification found on diagnostic imaging of breast: Secondary | ICD-10-CM

## 2018-08-06 DIAGNOSIS — M720 Palmar fascial fibromatosis [Dupuytren]: Secondary | ICD-10-CM | POA: Diagnosis not present

## 2018-08-06 DIAGNOSIS — M13849 Other specified arthritis, unspecified hand: Secondary | ICD-10-CM | POA: Diagnosis not present

## 2018-08-06 DIAGNOSIS — M79642 Pain in left hand: Secondary | ICD-10-CM | POA: Diagnosis not present

## 2018-08-06 DIAGNOSIS — M79641 Pain in right hand: Secondary | ICD-10-CM | POA: Diagnosis not present

## 2018-08-20 ENCOUNTER — Other Ambulatory Visit: Payer: Self-pay | Admitting: Family Medicine

## 2018-08-20 DIAGNOSIS — J449 Chronic obstructive pulmonary disease, unspecified: Secondary | ICD-10-CM

## 2018-09-23 ENCOUNTER — Ambulatory Visit
Admission: RE | Admit: 2018-09-23 | Discharge: 2018-09-23 | Disposition: A | Discharge status: Home / Self Care | Payer: PPO | Source: Ambulatory Visit | Attending: Family Medicine | Admitting: Family Medicine

## 2018-09-23 ENCOUNTER — Other Ambulatory Visit: Payer: Self-pay | Admitting: Family Medicine

## 2018-09-23 DIAGNOSIS — R921 Mammographic calcification found on diagnostic imaging of breast: Secondary | ICD-10-CM

## 2018-09-23 DIAGNOSIS — R928 Other abnormal and inconclusive findings on diagnostic imaging of breast: Secondary | ICD-10-CM

## 2018-09-25 ENCOUNTER — Ambulatory Visit
Admission: RE | Admit: 2018-09-25 | Discharge: 2018-09-25 | Disposition: A | Discharge status: Home / Self Care | Payer: PPO | Source: Ambulatory Visit | Attending: Family Medicine | Admitting: Family Medicine

## 2018-09-25 DIAGNOSIS — R928 Other abnormal and inconclusive findings on diagnostic imaging of breast: Secondary | ICD-10-CM | POA: Diagnosis not present

## 2018-09-25 DIAGNOSIS — R921 Mammographic calcification found on diagnostic imaging of breast: Secondary | ICD-10-CM | POA: Diagnosis not present

## 2018-09-25 HISTORY — PX: BREAST BIOPSY: SHX20

## 2018-09-28 ENCOUNTER — Other Ambulatory Visit: Payer: Self-pay | Admitting: Family Medicine

## 2018-09-28 LAB — SURGICAL PATHOLOGY

## 2018-09-28 NOTE — Telephone Encounter (Signed)
[place] faxed refill request for the following medications:  meloxicam (MOBIC) 7.5 MG tablet 90 day supply   This is Dr. B patient  Please advise.

## 2018-09-29 ENCOUNTER — Other Ambulatory Visit: Payer: Self-pay | Admitting: Family Medicine

## 2018-09-29 MED ORDER — MELOXICAM 7.5 MG PO TABS: 7.5000 mg | ORAL_TABLET | ORAL | 2 refills | Status: DC

## 2018-09-29 NOTE — Telephone Encounter (Signed)
[place] faxed refill request for the following medications:  meloxicam (MOBIC) 7.5 MG tablet  90 day supply  Rx was sent earlier today but it was sent to local pharmacy. Please advise. Thanks TNP

## 2018-09-29 NOTE — Telephone Encounter (Signed)
Please review for Dr. B ° ° °Thanks,  ° °-Laura  °

## 2018-09-30 MED ORDER — MELOXICAM 7.5 MG PO TABS: 7.5000 mg | ORAL_TABLET | ORAL | 2 refills | Status: DC

## 2018-09-30 NOTE — Telephone Encounter (Signed)
RX Canceled.   Thanks,   -Mickel Baas

## 2018-09-30 NOTE — Telephone Encounter (Signed)
New prescription sent to mail order please cancel prescription that was sent to [facility]

## 2018-09-30 NOTE — Telephone Encounter (Signed)
Kathlee Nations from [facility] is calling to request a 90 day supply of meloxicam (MOBIC) 7.5 MG tablet.  Direct contact is [phone].  Can be escribed.  Thanks, [employer]

## 2018-10-21 ENCOUNTER — Other Ambulatory Visit: Payer: Self-pay | Admitting: Family Medicine

## 2018-10-22 ENCOUNTER — Telehealth: Payer: Self-pay | Admitting: Family Medicine

## 2018-10-22 ENCOUNTER — Other Ambulatory Visit: Payer: Self-pay | Admitting: Family Medicine

## 2018-10-22 DIAGNOSIS — J449 Chronic obstructive pulmonary disease, unspecified: Secondary | ICD-10-CM

## 2018-10-22 MED ORDER — TIOTROPIUM BROMIDE MONOHYDRATE 18 MCG IN CAPS: ORAL_CAPSULE | RESPIRATORY_TRACT | 1 refills | Status: AC

## 2018-10-22 MED ORDER — MELOXICAM 7.5 MG PO TABS: 7.5000 mg | ORAL_TABLET | Freq: Every day | ORAL | 2 refills | Status: AC | PRN

## 2018-10-22 NOTE — Telephone Encounter (Signed)
[place] faxed refill request for the following medications:  SPIRIVA HANDIHALER 18 MCG inhalation capsule  90 day supply  Please advise. Thanks TNP

## 2018-10-22 NOTE — Telephone Encounter (Signed)
rx sent

## 2018-10-22 NOTE — Telephone Encounter (Signed)
[place] w/ [facility]  [phone] - secure VM.  Leave full first and last name.  Needing the following on meloxicam (MOBIC) 7.5 MG tablet: 90 day supply Verify Directions  Please advise.  Thanks, [employer]

## 2018-11-13 DIAGNOSIS — E7800 Pure hypercholesterolemia, unspecified: Secondary | ICD-10-CM | POA: Diagnosis not present

## 2018-11-29 IMAGING — CT CT CHEST W/O CM
2 of 3 series · 15 of 36 positions shown, 18 images · non-contrast
Comparison: 03/14/2016

CLINICAL DATA: Followup pulmonary nodules.  Former smoker.

EXAM:
CT CHEST WITHOUT CONTRAST
TECHNIQUE: Multidetector CT imaging of the chest was performed following the
standard protocol without IV contrast.

[Series 2: thorax · axial · 0.61mm/px · z∈[-719,-469]mm · 12 of 147 slices shown, 15 images]
[im 11/147  mediastinal]
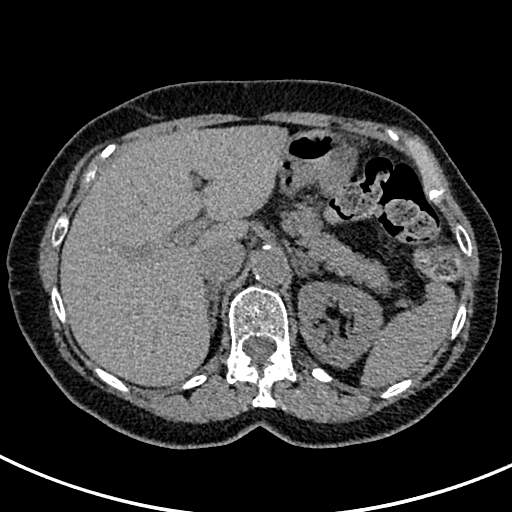
[im 11/147  lung]
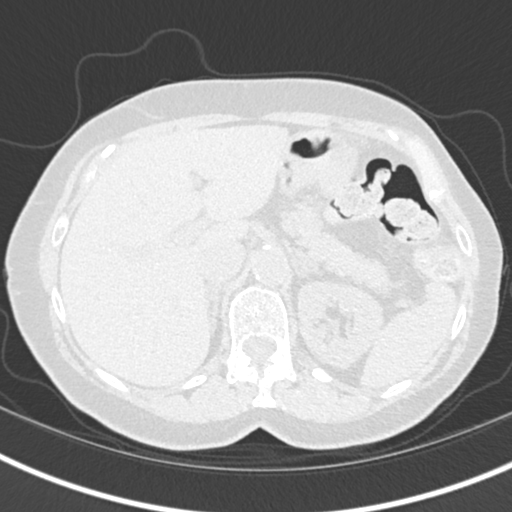
[im 22/147  lung]
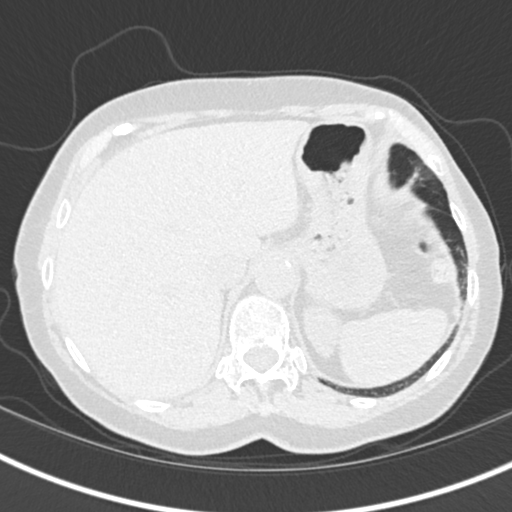
[im 33/147  lung]
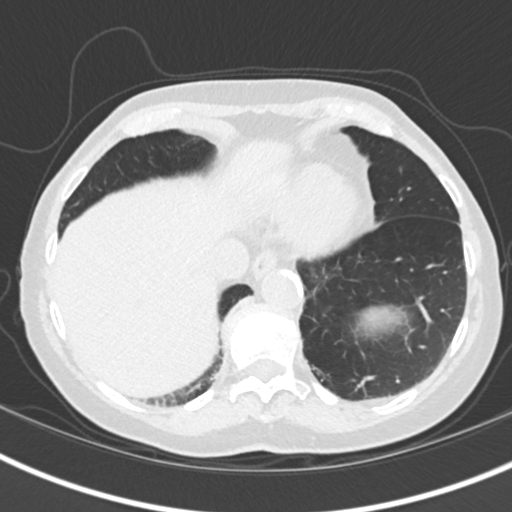
[im 44/147  lung]
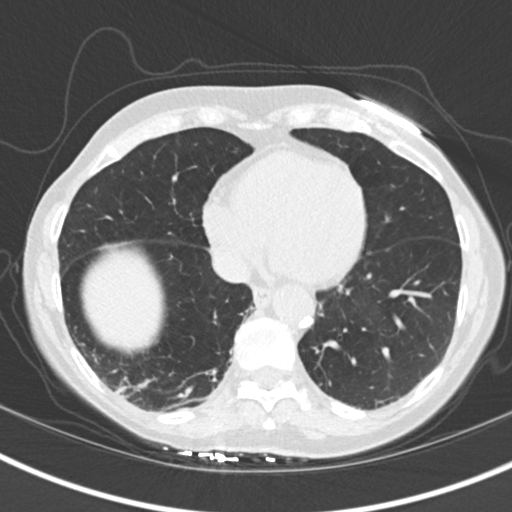
[im 55/147  mediastinal]
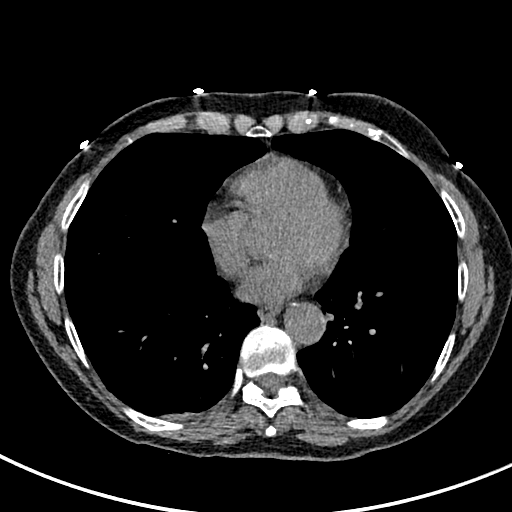
[im 55/147  lung]
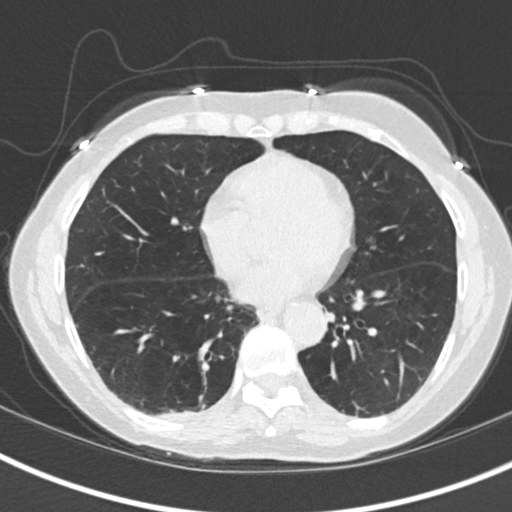
[im 65/147  lung]
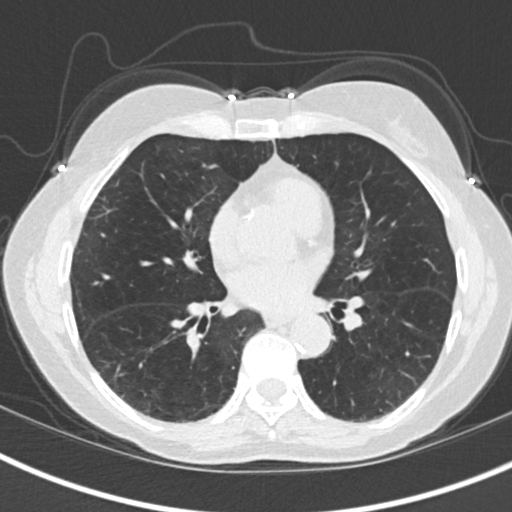
[im 82/147  lung]
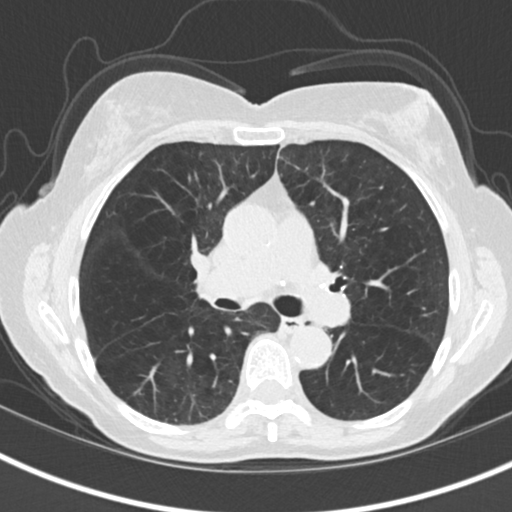
[im 92/147  lung]
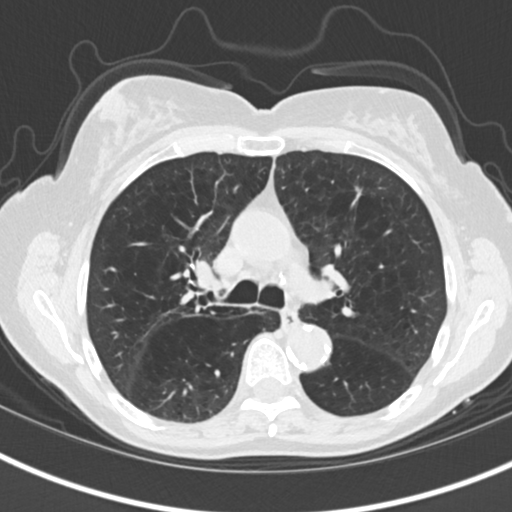
[im 103/147  mediastinal]
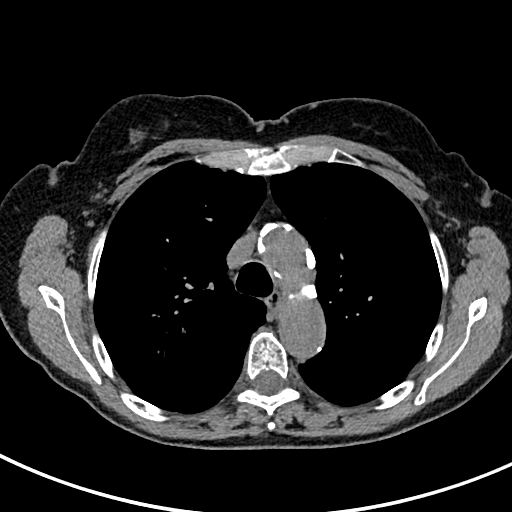
[im 103/147  lung]
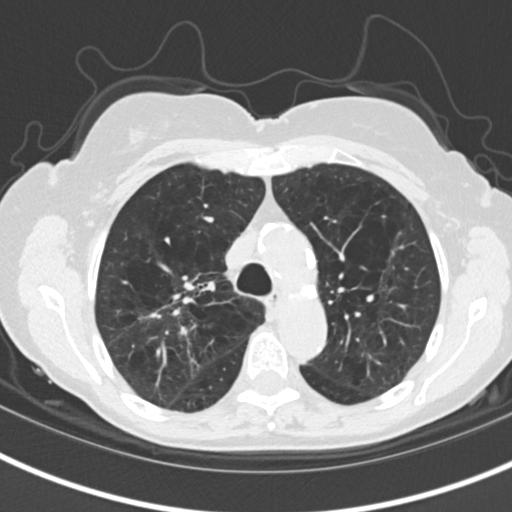
[im 114/147  lung]
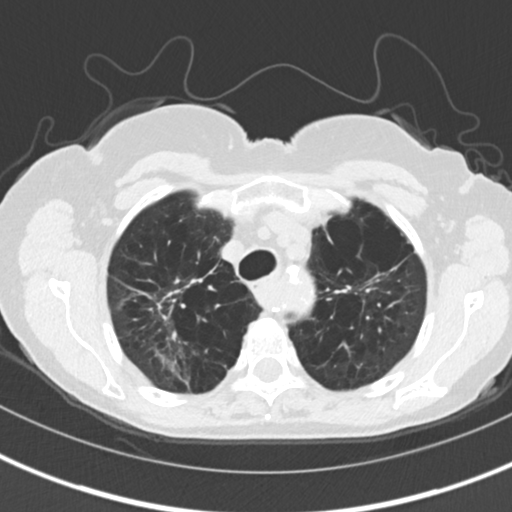
[im 125/147  lung]
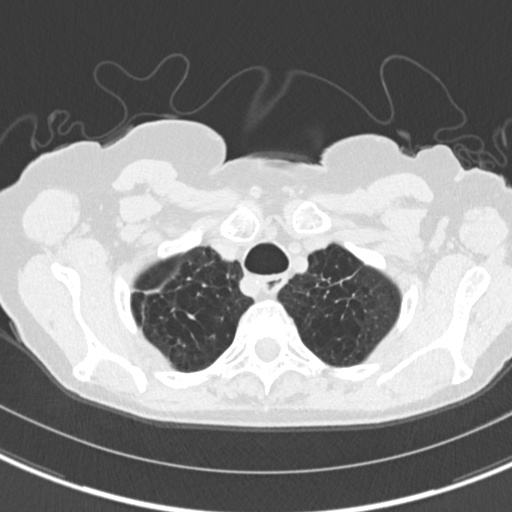
[im 136/147  lung]
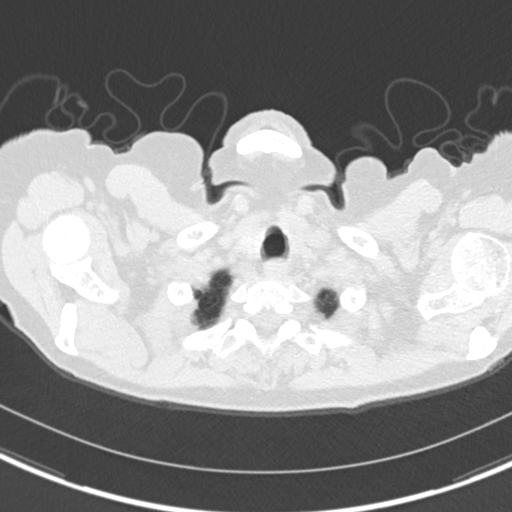

[Series 5: coronal · coronal · 0.59mm/px · 3 of 116 slices shown]
[im 24/116  lung]
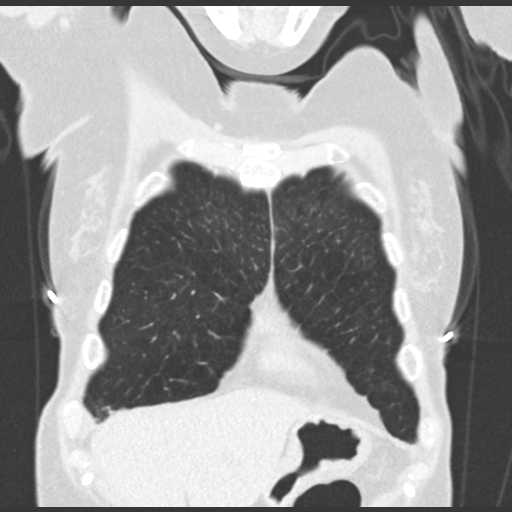
[im 47/116  lung]
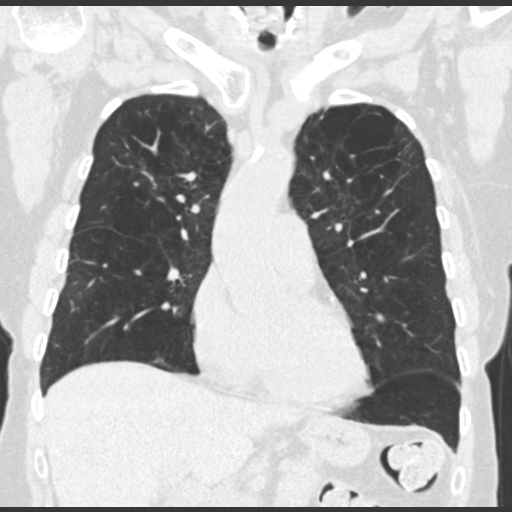
[im 70/116  lung]
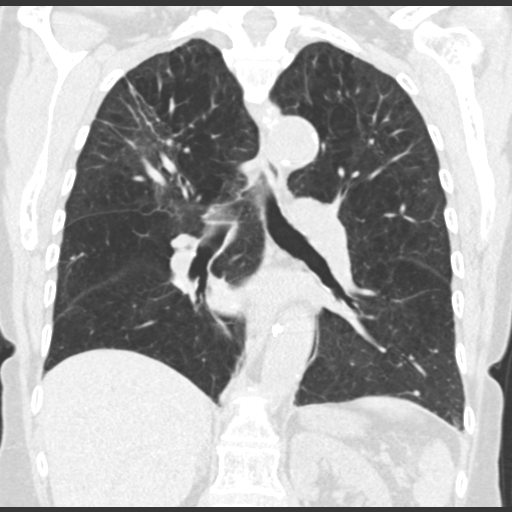

[15 of 36 positions shown; findings below may reference images not displayed]

FINDINGS: Cardiovascular: Heart size appears normal. Aortic atherosclerosis
noted. Calcifications involving the LAD coronary artery noted. No
pericardial effusion.

Mediastinum/Nodes: Calcified mediastinal lymph nodes are identified
compatible with prior granulomatous disease. There is a left
paratracheal node which measures 1.2 cm, image 53 of series 2. This
is become progressively more conspicuous. Previously this measured 9
mm.

Lungs/Pleura: No pleural fluid. Moderate to advanced changes of
centrilobular and paraseptal emphysema identified. Diffuse bronchial
wall thickening noted. Bilateral [place] lobe architectural distortion
and scarring is stable from previous exam and likely post
infectious. Pulmonary nodule within the left upper lobe is stable
measuring 3 mm, image 20 of series 3. Previously noted left upper
lobe 4 mm lung nodule has resolved in the interval. Spiculated
nodule within the anterior left upper lobe measures 5 mm, image 54
of series 3. Previously 6 mm. The index left lower lobe lung nodule
has resolved in the interval. Stable 4 mm right middle lobe
granuloma, image 89 of series 3.

Upper Abdomen: Low-attenuation nodule within the left adrenal gland
measures 1.2 cm and is compatible with a benign adenoma. There is
also a small stable adenoma within the right adrenal gland, image
134 of series 2 and image 135 of series 2.

Musculoskeletal: There is degenerative disc disease identified
within the thoracic spine. There is no aggressive lytic or sclerotic
bone lesions identified.
IMPRESSION: 1. Stable pulmonary nodules compared with 03/14/2016. The index
nodule within the left upper lobe currently measures 5 mm.
patient is high-risk. This recommendation follows the consensus
statement: Guidelines for Management of Incidental Pulmonary Nodules
Detected on CT Images: From the [HOSPITAL] 1551; Radiology
1551; [DATE].
2. There is a left paratracheal lymph node which demonstrates
gradual increase in size in the interval currently measuring 12 mm.
Attention at 12 month follow-up examination is advised. If there is
continued progression of this lymph node consider further
investigation with PET-CT and possible tissue sampling in this
patient who is at increased risk.
3. Diffuse bronchial wall thickening with emphysema, as above;
imaging findings suggestive of underlying COPD.
4. Aortic atherosclerosis and coronary artery calcifications.

## 2019-07-28 ENCOUNTER — Ambulatory Visit: Payer: PPO

## 2019-07-30 ENCOUNTER — Ambulatory Visit: Payer: PPO | Admitting: Family Medicine

## 2019-07-30 ENCOUNTER — Ambulatory Visit: Payer: PPO

## 2019-10-18 ENCOUNTER — Telehealth: Payer: Self-pay

## 2019-10-18 NOTE — Telephone Encounter (Signed)
Called pt to schedule an AWV and pt declined stating she now lives in [place]. Will removed from call list.

## 2021-05-22 ENCOUNTER — Telehealth: Payer: Self-pay | Admitting: Family Medicine

## 2021-05-22 NOTE — Telephone Encounter (Signed)
Pt called in wanting to get all medical records, pt states mostly on diagnoses of her cancer, please advise.

## 2021-05-23 NOTE — Telephone Encounter (Signed)
Spoke with pt about needing a signed release. Pt voiced understanding. TNP

## 2022-03-26 DEATH — deceased
